# Patient Record
Sex: Male | Born: 1940 | Race: White | Hispanic: No | Marital: Married | Smoking: Former smoker
Health system: Southern US, Community
[De-identification: ages and names within clinical notes are randomized; demographics above are authoritative.]

## PROBLEM LIST (undated history)

## (undated) DIAGNOSIS — K219 Gastro-esophageal reflux disease without esophagitis: Secondary | ICD-10-CM

## (undated) DIAGNOSIS — E538 Deficiency of other specified B group vitamins: Secondary | ICD-10-CM

## (undated) DIAGNOSIS — G4733 Obstructive sleep apnea (adult) (pediatric): Secondary | ICD-10-CM

## (undated) DIAGNOSIS — G629 Polyneuropathy, unspecified: Secondary | ICD-10-CM

## (undated) DIAGNOSIS — M459 Ankylosing spondylitis of unspecified sites in spine: Secondary | ICD-10-CM

## (undated) DIAGNOSIS — I4891 Unspecified atrial fibrillation: Secondary | ICD-10-CM

## (undated) DIAGNOSIS — I499 Cardiac arrhythmia, unspecified: Secondary | ICD-10-CM

## (undated) DIAGNOSIS — I4892 Unspecified atrial flutter: Secondary | ICD-10-CM

## (undated) DIAGNOSIS — I341 Nonrheumatic mitral (valve) prolapse: Secondary | ICD-10-CM

## (undated) HISTORY — PX: INGUINAL HERNIA REPAIR: SHX194

## (undated) HISTORY — PX: OTHER SURGICAL HISTORY: SHX169

## (undated) HISTORY — DX: Cardiac arrhythmia, unspecified: I49.9

## (undated) HISTORY — PX: COLONOSCOPY: SHX174

## (undated) HISTORY — DX: Polyneuropathy, unspecified: G62.9

## (undated) HISTORY — PX: ABLATION: SHX5711

## (undated) HISTORY — DX: Unspecified atrial fibrillation: I48.91

## (undated) HISTORY — DX: Gastro-esophageal reflux disease without esophagitis: K21.9

## (undated) HISTORY — DX: Deficiency of other specified B group vitamins: E53.8

## (undated) HISTORY — DX: Unspecified atrial flutter: I48.92

## (undated) HISTORY — DX: Nonrheumatic mitral (valve) prolapse: I34.1

## (undated) HISTORY — DX: Obstructive sleep apnea (adult) (pediatric): G47.33

## (undated) HISTORY — DX: Ankylosing spondylitis of unspecified sites in spine: M45.9

---

## 1999-09-22 ENCOUNTER — Ambulatory Visit (HOSPITAL_COMMUNITY): Admission: RE | Admit: 1999-09-22 | Discharge: 1999-09-22 | Payer: Self-pay | Admitting: Gastroenterology

## 2009-05-20 ENCOUNTER — Encounter: Admission: RE | Admit: 2009-05-20 | Discharge: 2009-05-20 | Payer: Self-pay | Admitting: Cardiology

## 2009-08-14 ENCOUNTER — Ambulatory Visit (HOSPITAL_COMMUNITY): Admission: RE | Admit: 2009-08-14 | Discharge: 2009-08-14 | Payer: Self-pay | Admitting: Cardiology

## 2009-09-27 ENCOUNTER — Encounter: Payer: Self-pay | Admitting: Internal Medicine

## 2009-10-04 ENCOUNTER — Ambulatory Visit (HOSPITAL_COMMUNITY): Admission: RE | Admit: 2009-10-04 | Discharge: 2009-10-04 | Payer: Self-pay | Admitting: Cardiology

## 2009-10-08 ENCOUNTER — Ambulatory Visit: Payer: Self-pay | Admitting: Internal Medicine

## 2009-10-08 DIAGNOSIS — G4733 Obstructive sleep apnea (adult) (pediatric): Secondary | ICD-10-CM | POA: Insufficient documentation

## 2009-10-08 DIAGNOSIS — I4891 Unspecified atrial fibrillation: Secondary | ICD-10-CM | POA: Insufficient documentation

## 2009-10-24 ENCOUNTER — Ambulatory Visit (HOSPITAL_BASED_OUTPATIENT_CLINIC_OR_DEPARTMENT_OTHER): Admission: RE | Admit: 2009-10-24 | Discharge: 2009-10-24 | Payer: Self-pay | Admitting: Internal Medicine

## 2009-10-24 ENCOUNTER — Encounter: Payer: Self-pay | Admitting: Internal Medicine

## 2009-10-26 ENCOUNTER — Ambulatory Visit: Payer: Self-pay | Admitting: Internal Medicine

## 2009-11-08 ENCOUNTER — Ambulatory Visit: Payer: Self-pay | Admitting: Internal Medicine

## 2009-11-18 ENCOUNTER — Encounter: Payer: Self-pay | Admitting: Internal Medicine

## 2009-11-27 ENCOUNTER — Encounter: Payer: Self-pay | Admitting: Internal Medicine

## 2009-11-28 ENCOUNTER — Encounter: Payer: Self-pay | Admitting: Internal Medicine

## 2009-12-08 ENCOUNTER — Encounter: Payer: Self-pay | Admitting: Internal Medicine

## 2009-12-09 ENCOUNTER — Encounter: Payer: Self-pay | Admitting: Internal Medicine

## 2009-12-12 ENCOUNTER — Ambulatory Visit: Payer: Self-pay | Admitting: Internal Medicine

## 2009-12-15 ENCOUNTER — Telehealth: Payer: Self-pay | Admitting: Internal Medicine

## 2009-12-19 ENCOUNTER — Encounter: Payer: Self-pay | Admitting: Internal Medicine

## 2010-01-05 ENCOUNTER — Encounter: Payer: Self-pay | Admitting: Internal Medicine

## 2010-02-27 ENCOUNTER — Encounter: Payer: Self-pay | Admitting: Internal Medicine

## 2010-02-28 ENCOUNTER — Ambulatory Visit: Payer: Self-pay | Admitting: Cardiology

## 2010-03-03 ENCOUNTER — Ambulatory Visit: Payer: Self-pay | Admitting: Cardiology

## 2010-03-03 ENCOUNTER — Encounter: Payer: Self-pay | Admitting: Internal Medicine

## 2010-03-05 ENCOUNTER — Ambulatory Visit: Payer: Self-pay | Admitting: Cardiology

## 2010-03-05 ENCOUNTER — Ambulatory Visit (HOSPITAL_COMMUNITY): Admission: RE | Admit: 2010-03-05 | Discharge: 2010-03-06 | Payer: Self-pay | Admitting: Cardiology

## 2010-03-07 ENCOUNTER — Ambulatory Visit: Payer: Self-pay | Admitting: Cardiology

## 2010-03-14 ENCOUNTER — Ambulatory Visit: Payer: Self-pay | Admitting: Cardiology

## 2010-03-21 ENCOUNTER — Ambulatory Visit: Payer: Self-pay | Admitting: Cardiology

## 2010-04-04 ENCOUNTER — Ambulatory Visit: Payer: Self-pay | Admitting: Cardiology

## 2010-04-17 ENCOUNTER — Ambulatory Visit: Payer: Self-pay | Admitting: Internal Medicine

## 2010-04-24 ENCOUNTER — Ambulatory Visit: Payer: Self-pay | Admitting: Cardiology

## 2010-05-13 ENCOUNTER — Ambulatory Visit: Payer: Self-pay | Admitting: Cardiology

## 2010-05-26 ENCOUNTER — Ambulatory Visit: Payer: Self-pay | Admitting: Cardiology

## 2010-06-05 ENCOUNTER — Ambulatory Visit: Payer: Self-pay | Admitting: Cardiology

## 2010-06-24 ENCOUNTER — Ambulatory Visit: Payer: Self-pay | Admitting: Cardiology

## 2010-07-29 ENCOUNTER — Ambulatory Visit: Payer: Self-pay | Admitting: Cardiology

## 2010-08-05 ENCOUNTER — Ambulatory Visit: Payer: Self-pay | Admitting: Cardiology

## 2010-08-27 ENCOUNTER — Ambulatory Visit (INDEPENDENT_AMBULATORY_CARE_PROVIDER_SITE_OTHER): Payer: Medicare Other | Admitting: Cardiology

## 2010-08-27 DIAGNOSIS — I4891 Unspecified atrial fibrillation: Secondary | ICD-10-CM

## 2010-08-27 DIAGNOSIS — E7800 Pure hypercholesterolemia, unspecified: Secondary | ICD-10-CM

## 2010-08-28 NOTE — Assessment & Plan Note (Signed)
Summary: rov 1 month///kp   Primary Provider/Referring Provider:  Ronny Flurry  CC:  Follow up visit-sleep.Marland Kitchen  History of Present Illness: History of Present Illness: November 03, 2009-  70 yoM seenat kind request of Dr Patty Sermons for suspected sleep apnea. He has atrial fibrilation, now on Multaq, but may need ablation. The question of sleep apnea has been raised, possibly affecting anesthesia risk. His wife tells him he snores and stops breathing. At home they mask this with a white noise generator. He denies daytime somnolence. Bedtime 10-11PM, latency 5 minutes with no sleep med. Awake 1-2x before finally waking at 530-6AM. Weight has been stable. Little caffeine. Back pain has limited sleep positions some over time.  November 08, 2009- OSA  Returns for f/u of his sleep study. We reviewed the study and discussed medical significance and treatment options. NPSG 10/24/09- AHI 21.8/hr. Comfort was affecfted by inability to lie on his back.   Dec 12, 2009- OSA He changed mask from pillows  to a nasal mask he likes better initially. He averages 5 hours every night and is motivated to keep using CPAP. His wife indicates no snore through this mask.  We don't yet have the download from his AutoPAP.  He likes his new Tempurpedic mattress.    Current Medications (verified): 1)  Coumadin 10 Mg Tabs (Warfarin Sodium) .... Take As Directed 2)  Metoprolol Tartrate 50 Mg Tabs (Metoprolol Tartrate) .... Take 1 By Mouth Once Daily 3)  Cpap .... 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    Vital Signs:  Patient profile:   70 year old male Height:      76 inches Weight:      236 pounds BMI:     28.83 BP sitting:   130 / 74  (left arm) Cuff size:   large  Vitals Entered By: Reynaldo Minium CMA (Dec 12, 2009 3:21 PM)  O2 Flow:  Room air  Physical Exam  Additional Exam:  General: A/Ox3; pleasant and cooperative, NAD, tall, well-appearing SKIN: no rash, lesions NODES: no lymphadenopathy HEENT: [place]/AT, EOM- WNL, Conjuctivae- clear, PERRLA, TM-WNL, Nose- clear, Throat- clear and wnl, Mallampati  II NECK: Supple w/ fair ROM, JVD- none, normal carotid impulses w/o bruits Thyroid-  CHEST: Clear to P&A HEART: IRR, no m/g/r heard ABDOMEN: Soft and nl;  EAV:WUJW, nl pulses, no edema  NEURO: Grossly intact to observation      Impression & Recommendations:  Problem # 1:  OBSTRUCTIVE SLEEP APNEA (ICD-327.23)  Good initial start with CPAP. e will make conversion to fixed pressure when download is available., He has  relaistic expectation about adjustment to the mask. he was not dsignificantly sleepy before so it will be harder for him to recognize benefit.  Medications  Added to Medication List This Visit: 1)  Cpap  .... Ahc set on 5-15h2o  Other Orders: Est. Patient Level II (16109)  Patient Instructions: 1)  Please schedule a follow-up appointment in 4 months. 2)   I will get your pressure changed to a fixed setting as available.

## 2010-08-28 NOTE — Assessment & Plan Note (Signed)
Summary: rov 4 months///kp   Primary Provider/Referring Provider:  Ronny Flurry  CC:  4 month follow up visit-OSA.  History of Present Illness:  November 08, 2009- OSA  Returns for f/u of his sleep study. We reviewed the study and discussed medical significance and treatment options. NPSG 10/24/09- AHI 21.8/hr. Comfort was affecfted by inability to lie on his back.   Dec 12, 2009- OSA He changed mask from pillows  to a nasal mask he likes better initially. He averages 5 hours every night and is motivated to keep using CPAP. His wife indicates no snore through this mask.  We don't yet have the download from his AutoPAP.  He likes his new Tempurpedic mattress.  April 17, 2010- OSA, chronic AFib, ankylosing spondyitis CPAP Pressure of 11 was too high, but now at pressure of 9 he is not snoring and is able to sleep through the night. The tempurpedic mattress is also likely helping because it alows for his spondylitis better. He had ablation for AF in [place] in July, then two cardioversions. Since then he has been in sinus rhythm and is stable.. He had to adjust the CPAP machine at that time and found he was better without the ramp. Using nasal mask.    Preventive Screening-Counseling & Management  Alcohol-Tobacco     Smoking Status: never  Current Medications (verified): 1)  Coumadin 15 Mg Tabs (Warfarin Sodium) .... Take As Directed 2)  Metoprolol Tartrate 50 Mg Tabs (Metoprolol Tartrate) .... Take 1.5  By Mouth Once Daily 3)  Cpap 9 Advanced .... 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    Vital Signs:  Patient profile:   70 year old male Height:      76 inches Weight:      232.25 pounds BMI:     28.37 O2 Sat:      95 % on Room air Pulse rate:   64 / minute BP sitting:   104 / 60  (left arm) Cuff size:   regular  Vitals Entered By: Reynaldo Minium CMA (April 17, 2010 3:25 PM)  O2 Flow:  Room air CC: 4 month follow up visit-OSA   Physical Exam  Additional Exam:  General: A/Ox3; pleasant and cooperative, NAD, tall, well-appearing SKIN: no rash, lesions NODES: no lymphadenopathy HEENT: [place]/AT, EOM- WNL, Conjuctivae- clear, PERRLA, TM-WNL, Nose- clear, Throat- clear and wnl, Mallampati  II NECK: Supple w/ fair ROM, JVD- none, normal carotid impulses w/o bruits Thyroid-  CHEST: Clear to P&A HEART:Regular rhythm, no m/g/r heard ABDOMEN: Soft and nl;  MWU:XLKG, nl pulses, no edema  NEURO: Grossly intact to observation      Impression & Recommendations:  Problem # 1:  OBSTRUCTIVE SLEEP APNEA (ICD-327.23)  Good subjective control. He has had a chip ready to turn in. We anticipate leaving him at 9 cwp now. His compliance is good.  Flu vax  Orders: Est. Patient Level V (40102)  Problem # 2:  ATRIAL FIBRILLATION, CHRONIC (ICD-427.31)  He remains in sinus rhythm now and we talked  about the impact on his heart of untreated OSA. His updated medication list for this problem includes:    Metoprolol Tartrate 50 Mg Tabs (Metoprolol tartrate) .Marland Kitchen... Take 1.5  by mouth once daily  Orders: Est. Patient Level V (72536)  Medications Added to Medication List This Visit: 1)  Coumadin 15 Mg Tabs (warfarin Sodium)  .... Take as directed 2)  Metoprolol Tartrate 50 Mg Tabs (Metoprolol tartrate) .... Take 1.5  by mouth once daily 3)  Cpap 9 Advanced  .... Based on autotitration 12/09/09  Other Orders: Flu Vaccine 21yrs + (64403) Administration Flu vaccine - MCR (K7425)  Patient Instructions: 1)  Please schedule a follow-up appointment in 6 months. 2)  We will watch for the download result when you turn in the latest card. if we make a change from that and you don't like it- please let me know so we can fix it. Flu Vaccine Consent Questions     Do you have a history of severe allergic reactions to this vaccine? no    Any prior history of allergic reactions to egg and/or gelatin? no    Do you have a sensitivity to the preservative Thimersol? no    Do you have a past history of Guillan-Barre Syndrome? no    Do you currently have an acute febrile illness? no    Have you ever had a severe reaction to latex? no    Vaccine information given and explained to patient? yes    Are you currently pregnant? no    Lot Number:[id number]   Exp Date:01/23/2010   Site Given  Left Deltoid Sallye Lat CMA  April 17, 2010 4:22 PM      .lbmedflu

## 2010-08-28 NOTE — Letter (Signed)
Summary: CMN for CPAP Supplies/[facility]  CMN for CPAP Supplies/[facility]   Imported By: Sherian Rein 12/13/2009 14:27:41  _____________________________________________________________________  External Attachment:    Type:   Image     Comment:   External Document

## 2010-08-28 NOTE — Assessment & Plan Note (Signed)
Summary: questionable sleep apnea/ mbw   Primary Provider/Referring Provider:  Ronny Flurry  CC:  Sleep consult-possible sleep apnea;due for oblation soon.Marland Kitchen  History of Present Illness: 2009-10-12-  70 yoM seenat kind request of Dr Patty Sermons for suspected sleep apnea. He has atrial fibrilation, now on Multaq, but may need ablation. The question of sleep apnea has been raised, possibly affecting anesthesia risk. His wife tells him he snores and stops breathing. At home they mask this with a white noise generator. He denies daytime somnolence. Bedtime 10-11PM, latency 5 minutes with no sleep med. Awake 1-2x before finally waking at 530-6AM. Weight has been stable. Little caffeine. Back pain has limited sleep positions some over time.  Current Medications (verified): 1)  Coumadin 10 Mg Tabs (Warfarin Sodium) .... Take As Directed 2)  Metoprolol Tartrate 50 Mg Tabs (Metoprolol Tartrate) .... Take 1 By Mouth Once Daily 3)  Multaq 400 Mg Tabs (Dronedarone Hcl) .... 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  The patient denies shortness of breath at rest, productive cough, coughing up blood, chest pain, indigestion, loss of appetite,  weight change, abdominal pain, difficulty swallowing, sore throat, tooth/dental problems, headaches, nasal congestion/difficulty breathing through nose, sneezing, itching, ear ache, anxiety, depression, hand/feet swelling, joint stiffness or pain, rash, change in color of mucus, and fever.    Vital Signs:  Patient profile:   70 year old male Height:      76 inches Weight:      242 pounds BMI:     29.56 O2 Sat:      95 % on Room air Pulse rate:   91 / minute BP sitting:   112 / 74  (right arm) Cuff size:   regular  Vitals Entered By: Reynaldo Minium CMA (10/12/2009 3:55 PM)  O2 Flow:  Room air  Physical Exam  Additional Exam:  General: A/Ox3; pleasant and cooperative, NAD, tall, well-appearing SKIN: no rash, lesions NODES: no lymphadenopathy HEENT: /AT, EOM- WNL, Conjuctivae- clear, PERRLA, TM-WNL, Nose- clear, Throat- clear and wnl, Mallampati  II NECK: Supple w/ fair ROM, JVD- none, normal carotid impulses w/o bruits Thyroid- normal to palpation CHEST: Clear to P&A HEART: IRR, no m/g/r heard ABDOMEN: Soft and nl; nml bowel sounds; no organomegaly or masses noted NWG:NFAO, nl pulses, no edema  NEURO: Grossly intact to observation      Impression & Recommendations:  Problem # 1:  ? of OBSTRUCTIVE SLEEP APNEA (ICD-327.23)  Witnessed snoring and apnea as described by his wife. Medical issue is atrial fibrillation possibly needing ablation under anesthesia . We discussed and will schedule a sleep study.  Medications Added to Medication List This Visit: 1)  Coumadin 10 Mg Tabs (Warfarin sodium) .... Take as directed 2)  Metoprolol Tartrate 50 Mg Tabs (Metoprolol tartrate) .... Take 1 by mouth once daily 3)  Multaq 400 Mg Tabs (Dronedarone hcl) .... Take 1 by mouth once daily  Other Orders: Consultation Level IV (14782) DME Referral (DME)  Patient Instructions: 1)  Please schedule a follow-up appointment in 1 month. 2)  See [facility] to schedule sleep study. It is ok to  bring an [place] with you if you wish.

## 2010-08-28 NOTE — Letter (Signed)
Summary: [facility]   Imported By: Lester [place] 10/23/2009 09:15:10  _____________________________________________________________________  External Attachment:    Type:   Image     Comment:   External Document

## 2010-08-28 NOTE — Assessment & Plan Note (Signed)
Summary: rov 1 month ///kp   Primary Provider/Referring Provider:  Ronny Flurry  CC:  1 month follow up visit -sleep study results. .  History of Present Illness: 10/20/09-  68 yoM seenat kind request of Dr Patty Sermons for suspected sleep apnea. He has atrial fibrilation, now on Multaq, but may need ablation. The question of sleep apnea has been raised, possibly affecting anesthesia risk. His wife tells him he snores and stops breathing. At home they mask this with a white noise generator. He denies daytime somnolence. Bedtime 10-11PM, latency 5 minutes with no sleep med. Awake 1-2x before finally waking at 530-6AM. Weight has been stable. Little caffeine. Back pain has limited sleep positions some over time.  November 08, 2009- OSA  Returns for f/u of his sleep study. We reviewed the study and discussed medical significance and treatment options. NPSG 10/24/09- AHI 21.8/hr. Comfort was affecfted by inability to lie on his back.     Current Medications (verified): 1)  Coumadin 10 Mg Tabs (Warfarin Sodium) .... Take As Directed 2)  Metoprolol Tartrate 50 Mg Tabs (Metoprolol Tartrate) .... 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    Vital Signs:  Patient profile:   70 year old male Height:      76 inches Weight:      241.38 pounds O2 Sat:      92 % on Room air Pulse rate:   88 / minute BP sitting:   114 / 66  (left arm) Cuff size:   regular  Vitals Entered By: Reynaldo Minium CMA (November 08, 2009 3:39 PM)  O2 Flow:  Room air  Physical Exam  Additional Exam:  General: A/Ox3; pleasant and cooperative, NAD, tall, well-appearing SKIN: no rash, lesions NODES: no lymphadenopathy HEENT: [place]/AT, EOM- WNL, Conjuctivae- clear, PERRLA, TM-WNL, Nose- clear, Throat- clear and wnl, Mallampati  II NECK: Supple w/ fair ROM, JVD- none, normal carotid impulses w/o bruits Thyroid-  CHEST: Clear to P&A HEART: IRR, no m/g/r heard ABDOMEN: Soft and nl;  JYN:WGNF, nl pulses, no edema  NEURO: Grossly intact to observation      Impression & Recommendations:  Problem # 1:  ? of OBSTRUCTIVE SLEEP APNEA (ICD-327.23)  We had a long discussion of CPAP and alternatives, mask options and expectations. We are going to reassess CPAP with an autotitration trial.  Other Orders: Est. Patient Level III (62130) DME Referral (DME)  Patient Instructions: 1)  Please schedule a follow-up appointment in 1 month. 2)  See [facility] to start CPAP

## 2010-08-28 NOTE — Letter (Signed)
Summary: Cassell Clement MD  Cassell Clement MD   Imported By: Lester [place] 11/29/2009 10:06:26  _____________________________________________________________________  External Attachment:    Type:   Image     Comment:   External Document

## 2010-08-28 NOTE — Letter (Signed)
Summary: [facility]   Imported By: Sherian Rein 03/18/2010 11:43:19  _____________________________________________________________________  External Attachment:    Type:   Image     Comment:   External Document

## 2010-08-28 NOTE — Letter (Signed)
Summary: [facility]   Imported By: Sherian Rein 03/18/2010 11:44:10  _____________________________________________________________________  External Attachment:    Type:   Image     Comment:   External Document

## 2010-08-28 NOTE — Progress Notes (Signed)
Summary: Change CPAP to 11 cwp   Phone Note Other Incoming   Summary of Call: Advanced- CPAP autotitration to 10.4/ AHI 4.0. i will have fixed pressure set at 11. Initial call taken by: Waymon Budge MD,  Dec 15, 2009 7:24 PM  Follow-up for Phone Call        [facility] for pt to return my call in regards to cpap autotitration. Rhonda Cobb  Dec 16, 2009 11:31 AM Called pt at work [phone] and advised pt of titration report and that Dr. Maple Hudson wanted to change his cpap to a fixed pressure of 11 cwp. Pt is requesting that someone call him back regarding this. Pt stated that last night 12/15/09 pt woke up and felt like his mouth was full of air. He looked at pressure on cpap thinking that it probably was a high pressure and cpap stated that he was only on a pressure of 9.5 cwp. Pt is concerned that 9.5 caused this reaction and is afraid to go to 11 cwp. Please advise.  Pt may be reached during working hours at [phone]. Alfonso Ramus  Dec 16, 2009 5:51 PM  Follow-up by: Waymon Budge MD,  Dec 17, 2009 11:30 AM  Additional Follow-up for Phone Call Additional follow up Details #1::        Will reduce initial pressure setting for now.  LMOAM for pt that after reviewing phone note, Dr. Maple Hudson has decided to change cpap pressure to 9 cwp. Order has been sent to the homecare company. Advised pt to try this fixed pressure of 9 and call us back and give Dr. Maple Hudson an update on this pressure setting. Pt advised to call me back if he had any questions or concerns at [phone]. Alfonso Ramus  Dec 17, 2009 11:37 AM  Additional Follow-up by: Waymon Budge MD,  Dec 17, 2009 11:30 AM    New/Updated Medications: * CPAP 11 ADVANCED Based on autotitration 12/09/09

## 2010-08-28 NOTE — Letter (Signed)
Summary: [facility]   Imported By: Lester [place] 01/01/2010 10:42:13  _____________________________________________________________________  External Attachment:    Type:   Image     Comment:   External Document

## 2010-09-24 ENCOUNTER — Other Ambulatory Visit: Payer: Medicare Other

## 2010-09-29 ENCOUNTER — Other Ambulatory Visit (INDEPENDENT_AMBULATORY_CARE_PROVIDER_SITE_OTHER): Payer: Medicare Other

## 2010-09-29 DIAGNOSIS — E7800 Pure hypercholesterolemia, unspecified: Secondary | ICD-10-CM

## 2010-10-03 ENCOUNTER — Encounter: Payer: Self-pay | Admitting: Internal Medicine

## 2010-10-14 ENCOUNTER — Encounter: Payer: Self-pay | Admitting: Cardiology

## 2010-10-14 ENCOUNTER — Ambulatory Visit: Payer: Self-pay | Admitting: Internal Medicine

## 2010-12-12 NOTE — Procedures (Signed)
[place]  Patient:    Wesley [name], Wesley [name]                        MRN: [id number] Proc. Date: 09/22/99 Adm. Date:  [id number] Attending:  Rich Brave CC:         [place]is Pu. Patty Sermons, M.D.                           Procedure Report  PROCEDURE PERFORMED:  Colonoscopy.  ENDOSCOPIST:  Florencia Reasons, M.D.  INDICATIONS FOR PROCEDURE:  The patient is a 70 year old with family history of  colon cancer.  FINDINGS:  Normal exam to terminal ileum.  DESCRIPTION OF PROCEDURE:  The nature, purpose and risks of the procedure had been discussed with the patient, who provided written consent.  Sedation was fentanyl 100 mcg and Versed 10 mg IV without arrhythmias or desaturation.  The Olympus adult video colonoscope was advanced through a somewhat spastic sigmoid region to the cecum and for a short distance into normal-appearing terminal ileum. The cecum and terminal ileum were normal.  The quality of the prep was very good and it is felt that all areas were well seen.  This was a normal examination other than some mild sigmoid diverticulosis.  No polyps, cancer, colitis, vascular malformations or extensive diverticulosis were observed.  The patient has a history of ankylosing spondylitis but I did not see any evidence of frank ulcerative colitis.  No mucosal biopsies were obtained. Retroflexion in the rectum was unremarkable.  The patient tolerated the procedure well.  There were no apparent complications.  IMPRESSION:  Normal colonoscopy to the terminal ileum in a patient with a family history of colon cancer and a personal history of ankylosing spondylitis.  No evidence of precancerous lesions or ulcerative colitis noted.  Mild sigmoid diverticulosis present.  PLAN:  Consider follow-up exam in five years because of the family history.  PLAN: Consider follow-up colonoscopy in five years in view of the family history of colon  cancer. DD:  09/22/99 TD:  09/22/99 Job: [id number]

## 2011-01-12 ENCOUNTER — Encounter: Payer: Self-pay | Admitting: Cardiology

## 2011-01-20 ENCOUNTER — Ambulatory Visit (INDEPENDENT_AMBULATORY_CARE_PROVIDER_SITE_OTHER): Payer: Medicare Other | Admitting: Cardiology

## 2011-01-20 ENCOUNTER — Encounter: Payer: Self-pay | Admitting: Cardiology

## 2011-01-20 DIAGNOSIS — E7800 Pure hypercholesterolemia, unspecified: Secondary | ICD-10-CM | POA: Insufficient documentation

## 2011-01-20 DIAGNOSIS — I4891 Unspecified atrial fibrillation: Secondary | ICD-10-CM

## 2011-01-20 DIAGNOSIS — I480 Paroxysmal atrial fibrillation: Secondary | ICD-10-CM | POA: Insufficient documentation

## 2011-01-20 NOTE — Assessment & Plan Note (Signed)
The patient has a history of hypercholesterolemia.  He was unable to bring it under control with diet alone. He had previously had a trial of Lipitor but it had to be stopped because of myalgias.  He is trying to control it with diet and exercise.  He exercises regularly.  His weight has been stable.

## 2011-01-20 NOTE — Assessment & Plan Note (Signed)
The patient has a past history of atrial fibrillation.  He underwent ablation of his atrial fibrillation on 02/19/10.  Initially he held sinus rhythm for only 2 days before going back into atrial fib and had to be cardioverted are still in [place].  When he returned to [facility] he was again in atrial fib and we cardioverted him successfully on August 10 while he was taking Multac.  His Multac was essentially stopped and he has remained in normal sinus rhythm.  He wore a 30 day event monitor which did not show any recurrent atrial fibrillation and as a result of that his Coumadin was stopped and he was placed on aspirin.  He's not been aware of any palpitations.  His energy level is good.

## 2011-01-20 NOTE — Progress Notes (Signed)
Wesley [name] Date of Birth:  06/24/1941 [facility] / [facility] [address]   [address] [place], [place]  [address] [phone]           Fax   [phone]  HPI: This pleasant 70 year old gentleman has a past history of paroxysmal atrial fibrillation.  It subsequently became chronic atrial fib and therefore [facility]  He had cardioversion here in August 2011 which reestablished normal sinus rhythm and he has remained in sinus rhythm since that time.  He is normal and her on Coumadin.  He feels well.  His energy level is good.  He's having no chest pain or shortness of breath.  He has a history of hypercholesterolemia but was unable to tolerate Lipitor because of myalgias.  Presently he is trying to control it with diet and exercise alone.  Current Outpatient Prescriptions  Medication Sig Dispense Refill  . Ascorbic Acid (VITAMIN C) 1000 MG tablet Take 1,000 mg by mouth daily.        Marland Kitchen aspirin 325 MG tablet Take 325 mg by mouth daily.        Marland Kitchen b complex vitamins capsule Take 1 capsule by mouth daily.        Marland Kitchen esomeprazole (NEXIUM) 40 MG capsule Take 40 mg by mouth daily before breakfast.        . fish oil-omega-3 fatty acids 1000 MG capsule Take 1 g by mouth daily.        . metoprolol (TOPROL-XL) 50 MG 24 hr tablet Take 50 mg by mouth daily.       . Multiple Vitamin (MULTIVITAMIN) tablet Take 1 tablet by mouth daily.        Marland Kitchen zolpidem (AMBIEN) 10 MG tablet Take 10 mg by mouth at bedtime as needed.        Marland Kitchen DISCONTD: metoprolol (LOPRESSOR) 50 MG tablet Take 1 and 1/2 tablets by mouth once daily       . DISCONTD: warfarin (COUMADIN) 5 MG tablet Take as directed by coumadin clinic         No Known Allergies  Patient Active Problem List  Diagnoses  . OBSTRUCTIVE SLEEP APNEA  . Paroxysmal atrial fibrillation  . Hypercholesterolemia    History  Smoking status  . Former  Smoker  . Quit date: 07/27/1969  Smokeless tobacco  . Not on file    History  Alcohol Use  . Yes    1-2 most days    Family History  Problem Relation Age of Onset  . Prostate cancer Father   . Arthritis Father   . Cancer Father   . Heart failure Mother   . Hypertension Mother     Review of Systems: The patient denies any heat or cold intolerance.  No weight gain or weight loss.  The patient denies headaches or blurry vision.  There is no cough or sputum production.  The patient denies dizziness.  There is no hematuria or hematochezia.  The patient denies any muscle aches or arthritis.  The patient denies any rash.  The patient denies frequent falling or instability.  There is no history of depression or anxiety.  All other systems were reviewed and are negative.   Physical Exam: Filed Vitals:   01/20/11 0855  BP: 100/70  Pulse: 70  The general appearance feels a well-developed well nourished gentleman in no distress.Pupils equal and reactive.   Extraocular Movements are full.  There is no scleral icterus.  The mouth and pharynx are normal.  The neck is supple.  The carotids reveal no bruits.  The jugular venous pressure is normal.  The thyroid is not enlarged.  There is no lymphadenopathy.  The chest is clear.  Heart reveals no murmur gallop rub or click.  Abdomen is soft and nontender without hepatosplenomegaly or masses.  Extremities show no phlebitis or edema.    Assessment / Plan:  Continue present regimen and recheck in 4 months for followup office visit EKG and fasting lipid panel and chemistries.

## 2011-04-03 ENCOUNTER — Ambulatory Visit (INDEPENDENT_AMBULATORY_CARE_PROVIDER_SITE_OTHER): Payer: Medicare Other | Admitting: Sports Medicine

## 2011-04-03 VITALS — BP 124/70 | Ht 76.0 in | Wt 227.0 lb

## 2011-04-03 DIAGNOSIS — G4733 Obstructive sleep apnea (adult) (pediatric): Secondary | ICD-10-CM

## 2011-04-03 DIAGNOSIS — M67919 Unspecified disorder of synovium and tendon, unspecified shoulder: Secondary | ICD-10-CM

## 2011-04-03 DIAGNOSIS — M25511 Pain in right shoulder: Secondary | ICD-10-CM | POA: Insufficient documentation

## 2011-04-03 DIAGNOSIS — M75101 Unspecified rotator cuff tear or rupture of right shoulder, not specified as traumatic: Secondary | ICD-10-CM | POA: Insufficient documentation

## 2011-04-03 DIAGNOSIS — M19019 Primary osteoarthritis, unspecified shoulder: Secondary | ICD-10-CM | POA: Insufficient documentation

## 2011-04-03 DIAGNOSIS — M25519 Pain in unspecified shoulder: Secondary | ICD-10-CM

## 2011-04-03 NOTE — Progress Notes (Signed)
  Subjective:    Patient ID: Wesley [name], male    DOB: 10/21/1940, 70 y.o.   MRN: [id number]  HPI  RT shoulder pain intermittently x 10 years.  Enjoys playing tennis and golf.  Weakness in rt shoulder for the last 2 years has limited tennis serve, also has not played as much golf during this time.  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  Occasionally takes ibuprofen which is helpful.  Has been doing PT exercises for rotator cuff x 10 years which has helped preserve shoulder motion.  MRI by Dr. Teressa Senter 8 years ago.   Past Hx - ablation for AF in July 11 Has sleep apnea, wears CPAP  Review of Systems     Objective:   Physical Exam  Good ROM of neck Scapular protraction with repeated abduction and elevation and lacks 5 deg on rt Flexion and elevation lacks 5 deg on rt Lacks 4 in IR on rt with back scratch ER significantly limited with mild abduction on rt Weakness on empty can on rt Hawkins caused weakness and pain on rt Weakness with ER on rt, not on lt IR strong bilat Speeds test neg bilat Yergason's neg bilat No bicipital tenderness on rt No atrophy around posterior shoulder, but slight winging of rt shoulder        Assessment & Plan:

## 2011-04-03 NOTE — Assessment & Plan Note (Signed)
Currently continues the use of CPAP  He had symptoms for several years before starting this and this may have contributed to his poor sleep pattern and poor healing

## 2011-04-03 NOTE — Assessment & Plan Note (Signed)
He does well with over-the-counter medications and has learned to control his pain  Continue these but once we evaluate his rotator cuff he may be a candidate for using nitroglycerin

## 2011-04-03 NOTE — Assessment & Plan Note (Signed)
The patient is given a series of pendulum exercises to try to preserve the motion of the shoulder I don't think he should try any strength exercises at this point He can use some theraband at an easy level  We will do an ultrasound in the future and also recheck him after he has had 6 weeks of rehabilitation next

## 2011-04-14 ENCOUNTER — Other Ambulatory Visit: Payer: Medicare Other | Admitting: Sports Medicine

## 2011-04-20 ENCOUNTER — Other Ambulatory Visit: Payer: Self-pay | Admitting: Cardiology

## 2011-04-20 DIAGNOSIS — K219 Gastro-esophageal reflux disease without esophagitis: Secondary | ICD-10-CM

## 2011-04-20 MED ORDER — ESOMEPRAZOLE MAGNESIUM 40 MG PO CPDR: 40.0000 mg | DELAYED_RELEASE_CAPSULE | Freq: Every day | ORAL | Status: DC

## 2011-04-20 NOTE — Telephone Encounter (Signed)
Pt needs 90day supply of  nexium [facility] pls call patient and let him know it's been done please

## 2011-04-20 NOTE — Telephone Encounter (Signed)
Sent in Rx at request of patient

## 2011-04-21 ENCOUNTER — Ambulatory Visit (INDEPENDENT_AMBULATORY_CARE_PROVIDER_SITE_OTHER): Payer: Medicare Other | Admitting: Sports Medicine

## 2011-04-21 VITALS — BP 107/60

## 2011-04-21 DIAGNOSIS — M67919 Unspecified disorder of synovium and tendon, unspecified shoulder: Secondary | ICD-10-CM

## 2011-04-21 DIAGNOSIS — M75101 Unspecified rotator cuff tear or rupture of right shoulder, not specified as traumatic: Secondary | ICD-10-CM

## 2011-04-21 NOTE — Progress Notes (Signed)
  Subjective:    Patient ID: Wesley Peper., male    DOB: September 14, 1940, 70 y.o.   MRN: [id number]  HPI Right shoulder pain particularly with overhead motions for approximately 10 years. Getting better with rehabilitation exercises for the cuff. Localizes pain over deltoid. No radicular symptoms into fingers. Has never had a corticosteroid injection.   Review of Systems    negative with regards to the chief complaint. Objective:   Physical Exam General: Well-developed, well-nourished Caucasian male in no acute distress. MSK: Right Shoulder: Inspection reveals no abnormalities, atrophy or asymmetry. Palpation is normal with no tenderness over AC joint or bicipital groove. ROM is limited in abduction to about 90, full flexion extension internal and external rotation.. Rotator cuff strength weak with supraspinatus, other cuff muscles unremarkable.. No signs of impingement with positive Neer Hawkins and empty can sign. Speeds and Yergason's tests normal. No labral pathology noted with negative Obrien's, negative clunk and good stability. No painful arc and no drop arm sign. No apprehension sign  Consent obtained and verified. Time-out conducted. Noted no overlying erythema, induration, or other signs of local infection. Sterile betadine prep. Furthur cleansed with alcohol. Topical analgesic spray: Ethyl chloride. Joint: Right subacromial Approached in typical fashion with: 25-gauge needle Completed without difficulty Meds: 1 cc Depo-Medrol, 4 cc lidocaine  Complete resolution of symptoms suggest accurate placement of the medication. Advised to call if fevers/chills, erythema, induration, drainage, or persistent bleeding.  MSK US performed of: Right shoulder Shoulder:   Supraspinatus:  Appears normal on long and transverse views, no bursal bulge seen with shoulder abduction on impingement view. Infraspinatus:  Appears normal on long and transverse views. Subscapularis:  Appears  normal on long and transverse views. Teres Minor:  Appears normal on long and transverse views. AC joint:  Capsule distended, mild geyser sign. Glenohumeral Joint:  Appears normal without effusion. Posterior Glenoid Labrum:  Intact without visualized tears. Biceps Tendon:  Appears normal on long and transverse views, no fraying of tendon, tendon located in intertubercular groove, no subluxation with shoulder internal or external rotation. No increased power doppler signal.    Assessment & Plan:

## 2011-04-21 NOTE — Patient Instructions (Signed)
We injected your shoulder. Come back to see [place] in 2 weeks. Ihor Austin. Benjamin Stain, M.D.

## 2011-04-21 NOTE — Assessment & Plan Note (Signed)
No tears seen on ultrasound. Injected as above. He will come back to see [place] in 2-4 weeks.

## 2011-05-12 ENCOUNTER — Encounter: Payer: Self-pay | Admitting: Sports Medicine

## 2011-05-12 ENCOUNTER — Ambulatory Visit (INDEPENDENT_AMBULATORY_CARE_PROVIDER_SITE_OTHER): Payer: Medicare Other | Admitting: Sports Medicine

## 2011-05-12 DIAGNOSIS — M67919 Unspecified disorder of synovium and tendon, unspecified shoulder: Secondary | ICD-10-CM

## 2011-05-12 DIAGNOSIS — M75101 Unspecified rotator cuff tear or rupture of right shoulder, not specified as traumatic: Secondary | ICD-10-CM

## 2011-05-12 NOTE — Assessment & Plan Note (Signed)
See assessment  He has made great progress but since he has had trouble with this off and on for 10 years I would like to see him in 6 weeks to get him started on a chronic exercise program to help him prevent rotator cuff issues

## 2011-05-12 NOTE — Progress Notes (Signed)
  Subjective:    Patient ID: Wesley Peper., male    DOB: 21-Mar-1941, 70 y.o.   MRN: [id number]  HPI  F/u R shoulder pain 2/2 rotator cuff syndrome. S/p subacromial bursa injection 2 weeks ago. Has had pain x approx 10 years intermittently, but has recently worsened. Since injection he feels 70% improvement. Has been doing daily theraband exercises. Taking ibuprofen prn. Has improvement in abduction and flexion and feels that his shoulder is getting stronger. Denies any numbness/tingling/weakness in arm.    Past Hx - ablation for AF in July 11 Has sleep apnea, wears CPAP  Review of Systems      Objective:   Physical Exam  Gen: wdwn Lungs: no labored breathing  R shoulder: nontender to palpation along clavicle, AC joint, and gh joint No asymmetry noted Can flex to 180 with discomfort starting at approx 145 degrees, abduct to 145 degrees, slightly decreased internal rotation compared to L but adequate, full external rotation 5/5 and equal b/l strength in all direction No sign of impingement on hawkins, neers No pain/weakness on empty can Neg speeds, yergasons Neg crossover, scarf sign        Assessment & Plan:  * rotator cuff syndrome: improved -recommended continuing HEP with increased ROM up to 120degrees x 1 week then gradually increasing to FROM. As directed in pt handout. -RTC in 6 weeks if persistent symptoms

## 2011-05-12 NOTE — Patient Instructions (Signed)
Continue home exercise program:  Walking hand up wall in front x 6 for range of motion Shoulder dip stretch  Start using 3-5lbs to get to 120 degrees x 1 week then increase about 10 degrees/week

## 2011-06-03 ENCOUNTER — Ambulatory Visit (INDEPENDENT_AMBULATORY_CARE_PROVIDER_SITE_OTHER): Payer: Medicare Other | Admitting: Cardiology

## 2011-06-03 ENCOUNTER — Other Ambulatory Visit: Payer: Medicare Other | Admitting: *Deleted

## 2011-06-03 ENCOUNTER — Encounter: Payer: Self-pay | Admitting: Cardiology

## 2011-06-03 VITALS — BP 118/78 | HR 66 | Ht 76.0 in | Wt 229.0 lb

## 2011-06-03 DIAGNOSIS — I480 Paroxysmal atrial fibrillation: Secondary | ICD-10-CM

## 2011-06-03 DIAGNOSIS — I4891 Unspecified atrial fibrillation: Secondary | ICD-10-CM

## 2011-06-03 DIAGNOSIS — M25511 Pain in right shoulder: Secondary | ICD-10-CM

## 2011-06-03 DIAGNOSIS — I341 Nonrheumatic mitral (valve) prolapse: Secondary | ICD-10-CM

## 2011-06-03 DIAGNOSIS — E7800 Pure hypercholesterolemia, unspecified: Secondary | ICD-10-CM

## 2011-06-03 DIAGNOSIS — M25519 Pain in unspecified shoulder: Secondary | ICD-10-CM

## 2011-06-03 DIAGNOSIS — I059 Rheumatic mitral valve disease, unspecified: Secondary | ICD-10-CM

## 2011-06-03 NOTE — Patient Instructions (Signed)
Will have you come back in the morning after 8:30 for fasting labs and will call you with results.  Your physician recommends that you schedule a follow-up appointment in: 4 months with fasting labs and Wesley Bailey NP or Dr Patty Sermons

## 2011-06-03 NOTE — Assessment & Plan Note (Signed)
The patient has had no recurrent atrial fibrillation.  Denies any chest pain or shortness of breath or dizziness or syncope

## 2011-06-03 NOTE — Progress Notes (Signed)
Wesley [name]. Date of Birth:  February 18, 1941 [facility] / [facility] [address]   [address] [place], [place]  [address] [phone]           Fax   [phone]  HPI: This pleasant 70 year old gentleman is seen for a scheduled four-month followup office visit.  He has a history of previous atrial fibrillation.  He had successful ablation on 02/19/10 in [place].  Following that he had successful cardioversion here in August 2011 which reestablished normal sinus rhythm and he has remained in normal sinus rhythm since that time.  He is no longer on Coumadin.  He takes an aspirin.  He is back to full activity.  Current Outpatient Prescriptions  Medication Sig Dispense Refill  . Ascorbic Acid (VITAMIN C) 1000 MG tablet Take 1,000 mg by mouth daily.        Marland Kitchen aspirin 325 MG tablet Take 325 mg by mouth daily.        Marland Kitchen b complex vitamins capsule Take 1 capsule by mouth daily.        Marland Kitchen esomeprazole (NEXIUM) 40 MG capsule Take 1 capsule (40 mg total) by mouth daily before breakfast.  90 capsule  3  . fish oil-omega-3 fatty acids 1000 MG capsule Take 1 g by mouth daily.        . metoprolol (TOPROL-XL) 50 MG 24 hr tablet Take 50 mg by mouth daily.       . Multiple Vitamin (MULTIVITAMIN) tablet Take 1 tablet by mouth daily.        Marland Kitchen zolpidem (AMBIEN) 10 MG tablet Take 10 mg by mouth at bedtime as needed.          No Known Allergies  Patient Active Problem List  Diagnoses  . OBSTRUCTIVE SLEEP APNEA  . Paroxysmal atrial fibrillation  . Hypercholesterolemia  . Shoulder pain, right  . Rotator cuff syndrome of right shoulder    History  Smoking status  . Former Smoker  . Quit date: 07/27/1969  Smokeless tobacco  . Never Used    History  Alcohol Use  . Yes    1-2 most days    Family History  Problem Relation Age of Onset  . Prostate cancer Father   . Arthritis Father   . Cancer Father   . Heart failure Mother   . Hypertension Mother     Review  of Systems: The patient denies any heat or cold intolerance.  No weight gain or weight loss.  The patient denies headaches or blurry vision.  There is no cough or sputum production.  The patient denies dizziness.  There is no hematuria or hematochezia.  The patient denies any muscle aches or arthritis.  The patient denies any rash.  The patient denies frequent falling or instability.  There is no history of depression or anxiety.  All other systems were reviewed and are negative.   Physical Exam: Filed Vitals:   06/03/11 0841  BP: 118/78  Pulse: 66   general appearance reveals a well-developed well-nourished gentleman in no distress.Pupils equal and reactive.   Extraocular Movements are full.  There is no scleral icterus.  The mouth and pharynx are normal.  The neck is supple.  The carotids reveal no bruits.  The jugular venous pressure is normal.  The thyroid is not enlarged.  There is no lymphadenopathy.  The chest is clear to percussion and auscultation. There are no rales or rhonchi. Expansion of the chest is symmetrical.  Heart reveals a soft apical click and no murmur.The abdomen is soft and nontender. Bowel sounds are normal. The liver and spleen are not enlarged. There Are no abdominal masses. There are no bruits.  Extremities no phlebitis or edemaStrength is normal and symmetrical in all extremities.  There is no lateralizing weakness.  There are no sensory deficits.      Assessment / Plan: Continue same medication.  Recheck in 4 months for followup office visit lipid panel hepatic function panel basal metabolic panel and EKG

## 2011-06-03 NOTE — Assessment & Plan Note (Signed)
The patient has seen Dr. Darrick Penna concerning his right shoulder predicament.  He states that after doing the exercises that were prescribed, his shoulder is 70% better and he notices an improvement in his tennis.

## 2011-06-03 NOTE — Assessment & Plan Note (Signed)
Patient was to have had lab work today but he did not fast.  He will come back later this week for fasting lipids chemistries.  Is not on any statin drugs.  He is watching his diet carefully.

## 2011-06-03 NOTE — Assessment & Plan Note (Signed)
No chest pain or shortness of breath or palpitations 

## 2011-06-04 ENCOUNTER — Other Ambulatory Visit (INDEPENDENT_AMBULATORY_CARE_PROVIDER_SITE_OTHER): Payer: Medicare Other | Admitting: *Deleted

## 2011-06-04 DIAGNOSIS — E7800 Pure hypercholesterolemia, unspecified: Secondary | ICD-10-CM

## 2011-06-04 LAB — BASIC METABOLIC PANEL
BUN: 16 mg/dL (ref 6–23)
CO2: 30 mEq/L (ref 19–32)
Calcium: 9.1 mg/dL (ref 8.4–10.5)
Chloride: 106 mEq/L (ref 96–112)
Creatinine, Ser: 0.9 mg/dL (ref 0.4–1.5)
Glucose, Bld: 100 mg/dL — ABNORMAL HIGH (ref 70–99)
Potassium: 4.1 mEq/L (ref 3.5–5.1)
Sodium: 141 mEq/L (ref 135–145)

## 2011-06-04 LAB — HEPATIC FUNCTION PANEL
Alkaline Phosphatase: 56 U/L (ref 39–117)
Bilirubin, Direct: 0.2 mg/dL (ref 0.0–0.3)

## 2011-06-04 LAB — LIPID PANEL
HDL: 64.4 mg/dL (ref >39.00)
Total CHOL/HDL Ratio: 3
Triglycerides: 55 mg/dL (ref 0.0–149.0)

## 2011-06-05 ENCOUNTER — Ambulatory Visit (INDEPENDENT_AMBULATORY_CARE_PROVIDER_SITE_OTHER): Payer: Medicare Other

## 2011-06-05 DIAGNOSIS — Z23 Encounter for immunization: Secondary | ICD-10-CM

## 2011-06-09 ENCOUNTER — Telehealth: Payer: Self-pay | Admitting: *Deleted

## 2011-06-09 NOTE — Telephone Encounter (Signed)
Message copied by Burnell Blanks on Tue Jun 09, 2011  2:04 PM ------      Message from: Cassell Clement      Created: Fri Jun 05, 2011  9:18 PM       Please report.  The labs are stable.  Continue same meds.  Continue careful diet.  LDL 113 sl high so work harder on low chol diet.

## 2011-06-09 NOTE — Telephone Encounter (Signed)
Advised and mailed copy  

## 2011-06-16 ENCOUNTER — Ambulatory Visit (INDEPENDENT_AMBULATORY_CARE_PROVIDER_SITE_OTHER): Payer: Medicare Other | Admitting: Sports Medicine

## 2011-06-16 VITALS — BP 120/80

## 2011-06-16 DIAGNOSIS — M67919 Unspecified disorder of synovium and tendon, unspecified shoulder: Secondary | ICD-10-CM

## 2011-06-16 DIAGNOSIS — M75101 Unspecified rotator cuff tear or rupture of right shoulder, not specified as traumatic: Secondary | ICD-10-CM

## 2011-06-16 DIAGNOSIS — M719 Bursopathy, unspecified: Secondary | ICD-10-CM

## 2011-06-16 NOTE — Assessment & Plan Note (Signed)
Since he is 80-85% better by his estimate a think we can gradually advance his rehabilitation program  He can continue his normal activities  If this goes well we will see him as needed

## 2011-06-16 NOTE — Patient Instructions (Signed)
Rotator cuff  Doing well on all tests  Build to 5 lbs Start with 3 sets of 10 at least 3x week After 2 weeks try to increase to 3 sets of 15  If this improves to 90 to 95% of normal - keep up and you can see me if you need me

## 2011-06-16 NOTE — Progress Notes (Signed)
  Subjective:    Patient ID: Wesley [name]., male    DOB: 15-Apr-1941, 70 y.o.   MRN: [id number]  HPI Patient states he is at least 80% better and has been very consistent in doing his shoulder exercises at least 5 times per week He has been able to return to playing tennis Now he is able to serve without pain No night pain Not using any medications   Review of Systems     Objective:   Physical Exam No acute distress  Shoulder: Inspection reveals no abnormalities, atrophy or asymmetry. Palpation is normal with no tenderness over AC joint or bicipital groove. ROM is full in all planes. Rotator cuff strength normal throughout. No signs of impingement with negative Neer and Hawkin's tests, empty can. Speeds and Yergason's tests normal. No labral pathology noted with negative Obrien's, negative clunk and good stability. Normal scapular function observed. No painful arc and no drop arm sign. No apprehension sign  On today's exam he has regained his full motion including his back scratch History of testing is just as strong on the right as the left       Assessment & Plan:

## 2011-08-13 ENCOUNTER — Telehealth: Payer: Self-pay | Admitting: Cardiology

## 2011-08-13 NOTE — Telephone Encounter (Signed)
Left a message to call back.

## 2011-08-13 NOTE — Telephone Encounter (Signed)
New Problem   Patient has questions about surgical procedure.  Please return call on wk#

## 2011-08-14 DIAGNOSIS — L259 Unspecified contact dermatitis, unspecified cause: Secondary | ICD-10-CM | POA: Diagnosis not present

## 2011-08-14 DIAGNOSIS — D485 Neoplasm of uncertain behavior of skin: Secondary | ICD-10-CM | POA: Diagnosis not present

## 2011-08-14 DIAGNOSIS — L821 Other seborrheic keratosis: Secondary | ICD-10-CM | POA: Diagnosis not present

## 2011-08-14 NOTE — Telephone Encounter (Signed)
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  Dr Andi Hence an interventionalist radiologist who performs varicose vein procedures and would like to discuss with  Dr. Patty Sermons.  Will forward to  Dr. Patty Sermons and advised patient not back in office until 08/18/11

## 2011-08-14 NOTE — Telephone Encounter (Signed)
Pt rtn call from yesterday he really wants to talk to Dr. Patty Sermons

## 2011-08-20 NOTE — Telephone Encounter (Signed)
I talked to Wesley [name].  He will contact the interventional radiologist at [facility] to discuss the procedure further.

## 2011-09-22 DIAGNOSIS — L259 Unspecified contact dermatitis, unspecified cause: Secondary | ICD-10-CM | POA: Diagnosis not present

## 2011-10-07 ENCOUNTER — Ambulatory Visit (INDEPENDENT_AMBULATORY_CARE_PROVIDER_SITE_OTHER): Payer: Medicare Other | Admitting: Cardiology

## 2011-10-07 ENCOUNTER — Other Ambulatory Visit (INDEPENDENT_AMBULATORY_CARE_PROVIDER_SITE_OTHER): Payer: Medicare Other

## 2011-10-07 ENCOUNTER — Encounter: Payer: Self-pay | Admitting: Cardiology

## 2011-10-07 ENCOUNTER — Telehealth: Payer: Self-pay | Admitting: Cardiology

## 2011-10-07 VITALS — BP 110/70 | HR 73 | Ht 76.0 in | Wt 225.0 lb

## 2011-10-07 DIAGNOSIS — I480 Paroxysmal atrial fibrillation: Secondary | ICD-10-CM

## 2011-10-07 DIAGNOSIS — E7800 Pure hypercholesterolemia, unspecified: Secondary | ICD-10-CM

## 2011-10-07 DIAGNOSIS — I059 Rheumatic mitral valve disease, unspecified: Secondary | ICD-10-CM | POA: Diagnosis not present

## 2011-10-07 DIAGNOSIS — I4891 Unspecified atrial fibrillation: Secondary | ICD-10-CM

## 2011-10-07 DIAGNOSIS — I341 Nonrheumatic mitral (valve) prolapse: Secondary | ICD-10-CM

## 2011-10-07 LAB — BASIC METABOLIC PANEL
Creatinine, Ser: 0.9 mg/dL (ref 0.4–1.5)
GFR: 85.63 mL/min (ref >60.00)
Sodium: 142 mEq/L (ref 135–145)

## 2011-10-07 LAB — HEPATIC FUNCTION PANEL
ALT: 14 U/L (ref 0–53)
AST: 25 U/L (ref 0–37)
Albumin: 4 g/dL (ref 3.5–5.2)
Bilirubin, Direct: 0.1 mg/dL (ref 0.0–0.3)

## 2011-10-07 LAB — LIPID PANEL
HDL: 71.7 mg/dL (ref >39.00)
Total CHOL/HDL Ratio: 3
Triglycerides: 70 mg/dL (ref 0.0–149.0)

## 2011-10-07 NOTE — Telephone Encounter (Signed)
Please return call to patient [phone]  Patient would like to speak with nurse as he had breakfast before his labs, which should therefore be disregarded.  He can be reached at wk# [phone] for return call.

## 2011-10-07 NOTE — Progress Notes (Signed)
Eduar R Angelena Sole. Date of Birth:  Nov 24, 1940 [facility] [address] [place], [place]  [address] [phone]  Fax   [phone]  HPI: This pleasant 71 year old gentleman is seen back for a four-month followup office visit.  He has a past history of atrial fibrillation.  He had successful ablation of his atrial fibrillation on 02/19/10 in [place].  Following that he had successful cardioversion in [place] in August 2000.  Since then he has remained in normal sinus rhythm.  He is no longer on Coumadin and he does take a daily aspirin.  He is back to full activity.  He exercises regularly.  He has a past history of hypercholesterolemia.  At the present time he is on omega-3 fatty acids and low-cholesterol diet.  Current Outpatient Prescriptions  Medication Sig Dispense Refill  . Ascorbic Acid (VITAMIN C) 1000 MG tablet Take 1,000 mg by mouth daily.        Marland Kitchen aspirin 325 MG tablet Take 325 mg by mouth daily.        Marland Kitchen b complex vitamins capsule Take 1 capsule by mouth daily.        Marland Kitchen esomeprazole (NEXIUM) 40 MG capsule Take 1 capsule (40 mg total) by mouth daily before breakfast.  90 capsule  3  . fish oil-omega-3 fatty acids 1000 MG capsule Take 1 g by mouth daily.        . metoprolol (TOPROL-XL) 50 MG 24 hr tablet Take 50 mg by mouth daily.       . Multiple Vitamin (MULTIVITAMIN) tablet Take 1 tablet by mouth daily.        Marland Kitchen zolpidem (AMBIEN) 10 MG tablet Take 10 mg by mouth at bedtime as needed.          No Known Allergies  Patient Active Problem List  Diagnoses  . OBSTRUCTIVE SLEEP APNEA  . Paroxysmal atrial fibrillation  . Hypercholesterolemia  . Shoulder pain, right  . Rotator cuff syndrome of right shoulder  . Mitral valve prolapse    History  Smoking status  . Former Smoker  . Quit date: 07/27/1969  Smokeless tobacco  . Never Used    History  Alcohol Use  . Yes    1-2 most days    Family History  Problem Relation Age  of Onset  . Prostate cancer Father   . Arthritis Father   . Cancer Father   . Heart failure Mother   . Hypertension Mother     Review of Systems: The patient denies any heat or cold intolerance.  No weight gain or weight loss.  The patient denies headaches or blurry vision.  There is no cough or sputum production.  The patient denies dizziness.  There is no hematuria or hematochezia.  The patient denies any muscle aches or arthritis.  The patient denies any rash.  The patient denies frequent falling or instability.  There is no history of depression or anxiety.  All other systems were reviewed and are negative.   Physical Exam: Filed Vitals:   10/07/11 0848  BP: 110/70  Pulse: 73   the general appearance reveals a well-developed well-nourished gentleman in no distressThe head and neck exam reveals pupils equal and reactive.  Extraocular movements are full.  There is no scleral icterus.  The mouth and pharynx are normal.  The neck is supple.  The carotids reveal no bruits.  The jugular venous pressure is normal.  The  thyroid is not enlarged.  There  is no lymphadenopathy.  The chest is clear to percussion and auscultation.  There are no rales or rhonchi.  Expansion of the chest is symmetrical.  The precordium is quiet.  The first heart sound is normal.  The second heart sound is physiologically split.  There is no murmur gallop or rub.  There is a midsystolic click.  There is no abnormal lift or heave.  The abdomen is soft and nontender.  The bowel sounds are normal.  The liver and spleen are not enlarged.  There are no abdominal masses.  There are no abdominal bruits.  Extremities reveal good pedal pulses.  There is no phlebitis or edema.  There is no cyanosis or clubbing.  Strength is normal and symmetrical in all extremities.  There is no lateralizing weakness.  There are no sensory deficits.  The skin is warm and dry.  There is no rash.   EKG today shows normal sinus rhythm with left anterior  hemiblock and no ischemic changes.   Assessment / Plan:  Continue same medication and be rechecked in 4 months for followup office visit and fasting lipid panel.

## 2011-10-07 NOTE — Patient Instructions (Addendum)
Will obtain labs today and call you with the results (LP/BMET/HFP)  Your physician recommends that you continue on your current medications as directed. Please refer to the Current Medication list given to you today.  Your physician wants you to follow-up in: 4 months You will receive a reminder letter in the mail two months in advance. If you don't receive a letter, please call our office to schedule the follow-up appointment.

## 2011-10-07 NOTE — Assessment & Plan Note (Signed)
Patient has a history of hypercholesterolemia.  He is not on statin drugs at this point.  Today's labs were drawn after the patient had had breakfast and so are not entirely reliable.  We will plan to recheck fasting labs at his next visit in 4 months.  Continue to work on careful diet.  His weight is down 4 pounds since last visit.

## 2011-10-07 NOTE — Telephone Encounter (Signed)
Will forward to  Dr. Brackbill  

## 2011-10-07 NOTE — Telephone Encounter (Signed)
Please see my note regarding the lab results themselves

## 2011-10-07 NOTE — Assessment & Plan Note (Signed)
The patient has not been experiencing any atrial fibrillation.  He remains on Toprol-XL 50 mg daily.  He takes a 325 mg aspirin daily.

## 2011-10-08 ENCOUNTER — Telehealth: Payer: Self-pay | Admitting: *Deleted

## 2011-10-08 NOTE — Telephone Encounter (Signed)
Message copied by Burnell Blanks on Thu Oct 08, 2011  8:15 AM ------      Message from: Cassell Clement      Created: Wed Oct 07, 2011  5:05 PM       Please report.  The cholesterol is higher at 214 and the LDL is high at 128.  These numbers are probably not valid since he had breakfast before the blood was drawn.  We should plan to repeat a fasting lipid panel in about 4 months prior to his next office visit and be sure that he is fasting when the blood is drawn.  His liver function tests and electrolytes are satisfactory and his blood sugar was normal.

## 2011-10-08 NOTE — Telephone Encounter (Signed)
Mailed copy of labs and left message to call if any questions  

## 2011-10-08 NOTE — Telephone Encounter (Signed)
Called patient and left message already processed.  Mailed copy and highlighted  Dr. Yevonne Pax comments, call if any questions

## 2011-10-30 DIAGNOSIS — J069 Acute upper respiratory infection, unspecified: Secondary | ICD-10-CM | POA: Diagnosis not present

## 2011-11-11 ENCOUNTER — Telehealth: Payer: Self-pay | Admitting: Cardiology

## 2011-11-11 NOTE — Telephone Encounter (Signed)
New msg Pt wants to know which pcp Dr. Patty Sermons recommends

## 2011-11-11 NOTE — Telephone Encounter (Signed)
Spoke with patient and advised Dr Felicity Coyer, will call back if unable to get appointment

## 2012-01-21 ENCOUNTER — Other Ambulatory Visit: Payer: Self-pay | Admitting: Cardiology

## 2012-01-21 DIAGNOSIS — K219 Gastro-esophageal reflux disease without esophagitis: Secondary | ICD-10-CM

## 2012-01-21 MED ORDER — ESOMEPRAZOLE MAGNESIUM 40 MG PO CPDR: 40.0000 mg | DELAYED_RELEASE_CAPSULE | Freq: Every day | ORAL | Status: DC

## 2012-01-21 MED ORDER — METOPROLOL SUCCINATE ER 50 MG PO TB24: 50.0000 mg | ORAL_TABLET | Freq: Every day | ORAL | Status: DC

## 2012-02-12 ENCOUNTER — Other Ambulatory Visit (INDEPENDENT_AMBULATORY_CARE_PROVIDER_SITE_OTHER): Payer: Medicare Other

## 2012-02-12 ENCOUNTER — Other Ambulatory Visit: Payer: Self-pay | Admitting: *Deleted

## 2012-02-12 DIAGNOSIS — E7800 Pure hypercholesterolemia, unspecified: Secondary | ICD-10-CM | POA: Diagnosis not present

## 2012-02-12 LAB — LIPID PANEL
Cholesterol: 216 mg/dL — ABNORMAL HIGH (ref 0–200)
Triglycerides: 53 mg/dL (ref 0.0–149.0)
VLDL: 10.6 mg/dL (ref 0.0–40.0)

## 2012-02-12 LAB — HEPATIC FUNCTION PANEL: Total Bilirubin: 0.9 mg/dL (ref 0.3–1.2)

## 2012-02-12 LAB — BASIC METABOLIC PANEL
BUN: 12 mg/dL (ref 6–23)
CO2: 28 mEq/L (ref 19–32)
Chloride: 106 mEq/L (ref 96–112)
GFR: 96.2 mL/min (ref >60.00)
Glucose, Bld: 109 mg/dL — ABNORMAL HIGH (ref 70–99)

## 2012-02-12 LAB — LDL CHOLESTEROL, DIRECT: Direct LDL: 127.3 mg/dL

## 2012-02-15 NOTE — Progress Notes (Signed)
Quick Note:  Please make copy of labs for patient visit. ______ 

## 2012-02-19 ENCOUNTER — Encounter: Payer: Self-pay | Admitting: Cardiology

## 2012-02-19 ENCOUNTER — Ambulatory Visit (INDEPENDENT_AMBULATORY_CARE_PROVIDER_SITE_OTHER): Payer: Medicare Other | Admitting: Cardiology

## 2012-02-19 VITALS — BP 110/78 | HR 60 | Ht 76.0 in | Wt 230.0 lb

## 2012-02-19 DIAGNOSIS — E7800 Pure hypercholesterolemia, unspecified: Secondary | ICD-10-CM

## 2012-02-19 DIAGNOSIS — I4891 Unspecified atrial fibrillation: Secondary | ICD-10-CM | POA: Diagnosis not present

## 2012-02-19 DIAGNOSIS — G4733 Obstructive sleep apnea (adult) (pediatric): Secondary | ICD-10-CM

## 2012-02-19 DIAGNOSIS — I480 Paroxysmal atrial fibrillation: Secondary | ICD-10-CM

## 2012-02-19 NOTE — Patient Instructions (Addendum)
Your physician recommends that you continue on your current medications as directed. Please refer to the Current Medication list given to you today.  Your physician recommends that you schedule a follow-up appointment in: 4 months with fasting labs (lp/bmet/hfp)  

## 2012-02-19 NOTE — Assessment & Plan Note (Signed)
Cholesterol continues to run slightly high.  Total cholesterol is 216.  He would prefer not to take statin therapy.  Work harder on diet.  His weight is up 5 pounds since last visit.

## 2012-02-19 NOTE — Assessment & Plan Note (Signed)
No recurrence of atrial fibrillation.  Rhythm remains regular

## 2012-02-19 NOTE — Progress Notes (Signed)
Wesley [name]. Date of Birth:  Aug 21, 1940 [facility] [address] [place], [place]  [address] [phone]  Fax   [phone]  HPI: This pleasant 71 year old gentleman is seen for a scheduled four-month followup office visit.  He has a past history of atrial fibrillation but had successful ablation of his atrial fibrillation on 02/19/10 in [place].  Following his ablation he underwent successful cardioversion back in [place] in August 2011 and since then has remained in normal sinus rhythm.  He is no longer on Coumadin.  He does take a 325 mg aspirin daily.  Patient has a past history of hypercholesterolemia. Current Outpatient Prescriptions  Medication Sig Dispense Refill  . Ascorbic Acid (VITAMIN C) 1000 MG tablet Take 1,000 mg by mouth daily.        Marland Kitchen aspirin 325 MG tablet Take 325 mg by mouth daily.        Marland Kitchen b complex vitamins capsule Take 1 capsule by mouth daily.        Marland Kitchen esomeprazole (NEXIUM) 40 MG capsule Take 1 capsule (40 mg total) by mouth daily before breakfast.  90 capsule  3  . fish oil-omega-3 fatty acids 1000 MG capsule Take 1 g by mouth daily.        . metoprolol succinate (TOPROL-XL) 50 MG 24 hr tablet Take 1 tablet (50 mg total) by mouth daily.  90 tablet  3  . Multiple Vitamin (MULTIVITAMIN) tablet Take 1 tablet by mouth daily.        Marland Kitchen zolpidem (AMBIEN) 10 MG tablet Take 10 mg by mouth at bedtime as needed.          No Known Allergies  Patient Active Problem List  Diagnosis  . OBSTRUCTIVE SLEEP APNEA  . Paroxysmal atrial fibrillation  . Hypercholesterolemia  . Shoulder pain, right  . Rotator cuff syndrome of right shoulder  . Mitral valve prolapse    History  Smoking status  . Former Smoker  . Quit date: 07/27/1969  Smokeless tobacco  . Never Used    History  Alcohol Use  . Yes    1-2 most days    Family History  Problem Relation Age of Onset  . Prostate cancer Father   . Arthritis Father   .  Cancer Father   . Heart failure Mother   . Hypertension Mother     Review of Systems: The patient denies any heat or cold intolerance.  No weight gain or weight loss.  The patient denies headaches or blurry vision.  There is no cough or sputum production.  The patient denies dizziness.  There is no hematuria or hematochezia.  The patient denies any muscle aches or arthritis.  The patient denies any rash.  The patient denies frequent falling or instability.  There is no history of depression or anxiety.  All other systems were reviewed and are negative.   Physical Exam: Filed Vitals:   02/19/12 1049  BP: 110/78  Pulse: 60   the general appearance reveals a well-developed well-nourished gentleman in no distress.The head and neck exam reveals pupils equal and reactive.  Extraocular movements are full.  There is no scleral icterus.  The mouth and pharynx are normal.  The neck is supple.  The carotids reveal no bruits.  The jugular venous pressure is normal.  The  thyroid is not enlarged.  There is no lymphadenopathy.  The chest is clear to percussion and auscultation.  There are no rales or rhonchi.  Expansion of the chest is symmetrical.  The precordium is quiet.  The first heart sound is normal.  The second heart sound is physiologically split.  There is no murmur gallop rub or click.  There is no abnormal lift or heave.  The abdomen is soft and nontender.  The bowel sounds are normal.  The liver and spleen are not enlarged.  There are no abdominal masses.  There are no abdominal bruits.  Extremities reveal good pedal pulses.  There is no phlebitis or edema.  There is no cyanosis or clubbing.  Strength is normal and symmetrical in all extremities.  There is no lateralizing weakness.  There are no sensory deficits.  The skin is warm and dry.  There is no rash.      Assessment / Plan: Continue same medication.  Recheck in 4 months for office visit EKG and fasting lipid panel hepatic function panel  and basal metabolic panel.  Work harder on diet and weight loss.

## 2012-02-19 NOTE — Assessment & Plan Note (Signed)
The patient has obstructive sleep apnea which may have been a causative role in his previous atrial fibrillation.  He is careful to use his CPAP machine each night.

## 2012-02-24 DIAGNOSIS — H40019 Open angle with borderline findings, low risk, unspecified eye: Secondary | ICD-10-CM | POA: Diagnosis not present

## 2012-02-24 DIAGNOSIS — H2510 Age-related nuclear cataract, unspecified eye: Secondary | ICD-10-CM | POA: Diagnosis not present

## 2012-04-19 DIAGNOSIS — Z23 Encounter for immunization: Secondary | ICD-10-CM | POA: Diagnosis not present

## 2012-04-20 ENCOUNTER — Ambulatory Visit: Payer: Medicare Other | Admitting: Internal Medicine

## 2012-05-10 DIAGNOSIS — I4891 Unspecified atrial fibrillation: Secondary | ICD-10-CM | POA: Diagnosis not present

## 2012-05-10 DIAGNOSIS — E7800 Pure hypercholesterolemia, unspecified: Secondary | ICD-10-CM | POA: Diagnosis not present

## 2012-05-10 DIAGNOSIS — Z1331 Encounter for screening for depression: Secondary | ICD-10-CM | POA: Diagnosis not present

## 2012-05-10 DIAGNOSIS — R141 Gas pain: Secondary | ICD-10-CM | POA: Diagnosis not present

## 2012-05-10 DIAGNOSIS — R143 Flatulence: Secondary | ICD-10-CM | POA: Diagnosis not present

## 2012-05-11 DIAGNOSIS — R141 Gas pain: Secondary | ICD-10-CM | POA: Diagnosis not present

## 2012-05-11 DIAGNOSIS — R143 Flatulence: Secondary | ICD-10-CM | POA: Diagnosis not present

## 2012-05-11 DIAGNOSIS — R142 Eructation: Secondary | ICD-10-CM | POA: Diagnosis not present

## 2012-05-23 DIAGNOSIS — R198 Other specified symptoms and signs involving the digestive system and abdomen: Secondary | ICD-10-CM | POA: Diagnosis not present

## 2012-05-23 DIAGNOSIS — K591 Functional diarrhea: Secondary | ICD-10-CM | POA: Diagnosis not present

## 2012-06-01 DIAGNOSIS — Z8042 Family history of malignant neoplasm of prostate: Secondary | ICD-10-CM | POA: Diagnosis not present

## 2012-06-07 ENCOUNTER — Encounter: Payer: Self-pay | Admitting: Sports Medicine

## 2012-06-07 ENCOUNTER — Ambulatory Visit (INDEPENDENT_AMBULATORY_CARE_PROVIDER_SITE_OTHER): Payer: Medicare Other | Admitting: Sports Medicine

## 2012-06-07 VITALS — BP 113/71 | HR 65 | Ht 76.0 in | Wt 230.0 lb

## 2012-06-07 DIAGNOSIS — M722 Plantar fascial fibromatosis: Secondary | ICD-10-CM | POA: Insufficient documentation

## 2012-06-07 DIAGNOSIS — M79609 Pain in unspecified limb: Secondary | ICD-10-CM | POA: Diagnosis not present

## 2012-06-07 DIAGNOSIS — M79673 Pain in unspecified foot: Secondary | ICD-10-CM

## 2012-06-07 NOTE — Assessment & Plan Note (Signed)
Soft heel cups seemed to help his pain a lot  Makes this seem more like os calcis

## 2012-06-07 NOTE — Assessment & Plan Note (Signed)
Use arch strap  Std PF stretches and exercises  rescan if still w sxs p 6 wks

## 2012-06-07 NOTE — Progress Notes (Signed)
Patient ID: Wesley [name]., male   DOB: August 29, 1940, 71 y.o.   MRN: [id number]  Now with RT heel pain - bad past 3 weeks Pain is there all day and not worse in morning Rt at point of heel and not at insertion of PF Took trips in [place] and sept and did a lot of walking May have bruised his heel on rock - unsure if that was trigger  Has had this a few times over past 10 years and usually went away quickly  PE NAD  Bilat cavus feet TTP over os calcis on RT No TTP at insertion of PF No nodules noted Foot structure is normal  [place] There is a thickened PF at 0.79 at the heel There is a calcific fragment possibly from os calcis in soft tissue of heel This is surrounded by hypoechoic change Mild spur at med heel

## 2012-06-08 DIAGNOSIS — D485 Neoplasm of uncertain behavior of skin: Secondary | ICD-10-CM | POA: Diagnosis not present

## 2012-06-08 DIAGNOSIS — D235 Other benign neoplasm of skin of trunk: Secondary | ICD-10-CM | POA: Diagnosis not present

## 2012-06-08 DIAGNOSIS — L570 Actinic keratosis: Secondary | ICD-10-CM | POA: Diagnosis not present

## 2012-06-08 DIAGNOSIS — D1801 Hemangioma of skin and subcutaneous tissue: Secondary | ICD-10-CM | POA: Diagnosis not present

## 2012-06-10 DIAGNOSIS — N401 Enlarged prostate with lower urinary tract symptoms: Secondary | ICD-10-CM | POA: Diagnosis not present

## 2012-06-13 ENCOUNTER — Other Ambulatory Visit: Payer: Medicare Other

## 2012-06-15 ENCOUNTER — Other Ambulatory Visit (INDEPENDENT_AMBULATORY_CARE_PROVIDER_SITE_OTHER): Payer: Medicare Other

## 2012-06-15 DIAGNOSIS — E7800 Pure hypercholesterolemia, unspecified: Secondary | ICD-10-CM | POA: Diagnosis not present

## 2012-06-15 LAB — HEPATIC FUNCTION PANEL
AST: 27 U/L (ref 0–37)
Albumin: 3.8 g/dL (ref 3.5–5.2)
Alkaline Phosphatase: 54 U/L (ref 39–117)
Bilirubin, Direct: 0.2 mg/dL (ref 0.0–0.3)
Total Bilirubin: 1.3 mg/dL — ABNORMAL HIGH (ref 0.3–1.2)
Total Protein: 6.6 g/dL (ref 6.0–8.3)

## 2012-06-15 LAB — BASIC METABOLIC PANEL
CO2: 31 mEq/L (ref 19–32)
Calcium: 9.2 mg/dL (ref 8.4–10.5)
Creatinine, Ser: 0.9 mg/dL (ref 0.4–1.5)
GFR: 92.29 mL/min (ref >60.00)
Glucose, Bld: 92 mg/dL (ref 70–99)

## 2012-06-15 LAB — LIPID PANEL: VLDL: 12 mg/dL (ref 0.0–40.0)

## 2012-06-15 NOTE — Progress Notes (Signed)
Quick Note:  Please make copy of labs for patient visit. ______ 

## 2012-06-17 ENCOUNTER — Encounter: Payer: Self-pay | Admitting: Cardiology

## 2012-06-17 ENCOUNTER — Ambulatory Visit (INDEPENDENT_AMBULATORY_CARE_PROVIDER_SITE_OTHER): Payer: Medicare Other | Admitting: Cardiology

## 2012-06-17 VITALS — BP 123/66 | HR 55 | Resp 18 | Ht 76.0 in | Wt 230.1 lb

## 2012-06-17 DIAGNOSIS — I4891 Unspecified atrial fibrillation: Secondary | ICD-10-CM | POA: Diagnosis not present

## 2012-06-17 DIAGNOSIS — I480 Paroxysmal atrial fibrillation: Secondary | ICD-10-CM

## 2012-06-17 DIAGNOSIS — E7800 Pure hypercholesterolemia, unspecified: Secondary | ICD-10-CM | POA: Diagnosis not present

## 2012-06-17 NOTE — Patient Instructions (Addendum)
Your physician recommends that you continue on your current medications as directed. Please refer to the Current Medication list given to you today.  Your physician recommends that you schedule a follow-up appointment in: 4 months with fasting labs (lp/bmet/hfp)  

## 2012-06-17 NOTE — Assessment & Plan Note (Signed)
The patient has a history of hypercholesterolemia.  He is controlling this with careful diet.  He is not on any statin therapy.

## 2012-06-17 NOTE — Assessment & Plan Note (Signed)
The patient has not been aware of any recurrent atrial fibrillation his last visit.  Is not having any chest pain or shortness of breath.  His energy level is good and he uses the elliptical machine for exercise.  He is not playing tennis at the present time because of some problems with his heel and he has seen Dr. Roanna Epley.

## 2012-06-17 NOTE — Progress Notes (Signed)
Wesley [name]. Date of Birth:  Aug 19, 1940 [facility] [address] [place], [place]  [address] [phone]  Fax   [phone]  HPI: This pleasant 71 year old gentleman is seen for a scheduled four-month followup office visit. He has a past history of atrial fibrillation but had successful ablation of his atrial fibrillation on 02/19/10 in [place]. Following his ablation he underwent successful cardioversion back in [place] in August 2011 and since then has remained in normal sinus rhythm. He is no longer on Coumadin. He does take a 325 mg aspirin daily. Patient has a past history of hypercholesterolemia.  Since her last saw the patient he and his wife went on a vacation trip to [place].  After he got back home he developed diarrhea which has persisted.  He has had it for 10 weeks now.  He recently saw Dr. Matthias Hughs who has placed him on probiotics.  So far there has not been significant improvement and he is still having multiple loose stools daily.   Current Outpatient Prescriptions  Medication Sig Dispense Refill  . Ascorbic Acid (VITAMIN C) 1000 MG tablet Take 1,000 mg by mouth daily.        Marland Kitchen aspirin 325 MG tablet Take 325 mg by mouth daily.        Marland Kitchen b complex vitamins capsule Take 1 capsule by mouth daily.        Marland Kitchen esomeprazole (NEXIUM) 40 MG capsule Take 1 capsule (40 mg total) by mouth daily before breakfast.  90 capsule  3  . fish oil-omega-3 fatty acids 1000 MG capsule Take 1 g by mouth daily.        . metoprolol succinate (TOPROL-XL) 50 MG 24 hr tablet Take 1 tablet (50 mg total) by mouth daily.  90 tablet  3  . Multiple Vitamin (MULTIVITAMIN) tablet Take 1 tablet by mouth daily.        . Probiotic Product (PROBIOTIC DAILY PO) Take by mouth as directed.      . zolpidem (AMBIEN) 10 MG tablet Take 10 mg by mouth at bedtime as needed.          No Known Allergies  Patient Active Problem List  Diagnosis  . OBSTRUCTIVE SLEEP APNEA  .  Paroxysmal atrial fibrillation  . Hypercholesterolemia  . Shoulder pain, right  . Rotator cuff syndrome of right shoulder  . Mitral valve prolapse  . Heel pain  . Plantar fasciitis    History  Smoking status  . Former Smoker  . Quit date: 07/27/1969  Smokeless tobacco  . Never Used    History  Alcohol Use  . Yes    Comment: 1-2 most days    Family History  Problem Relation Age of Onset  . Prostate cancer Father   . Arthritis Father   . Cancer Father   . Heart failure Mother   . Hypertension Mother     Review of Systems: The patient denies any heat or cold intolerance.  No weight gain or weight loss.  The patient denies headaches or blurry vision.  There is no cough or sputum production.  The patient denies dizziness.  There is no hematuria or hematochezia.  The patient denies any muscle aches or arthritis.  The patient denies any rash.  The patient denies frequent falling or instability.  There is no history of depression or anxiety.  All other systems were reviewed and are negative.   Physical Exam: Filed Vitals:   06/17/12 0916  BP:  123/66  Pulse: 55  Resp: 18   the general appearance reveals a well-developed well-nourished gentleman in no distress.The head and neck exam reveals pupils equal and reactive.  Extraocular movements are full.  There is no scleral icterus.  The mouth and pharynx are normal.  The neck is supple.  The carotids reveal no bruits.  The jugular venous pressure is normal.  The  thyroid is not enlarged.  There is no lymphadenopathy.  The chest is clear to percussion and auscultation.  There are no rales or rhonchi.  Expansion of the chest is symmetrical.  The precordium is quiet.  The first heart sound is normal.  The second heart sound is physiologically split.  There is no murmur gallop rub or click.  There is no abnormal lift or heave.  The abdomen is soft and nontender.  The bowel sounds are normal.  The liver and spleen are not enlarged.  There are  no abdominal masses.  There are no abdominal bruits.  Extremities reveal good pedal pulses.  There is no phlebitis or edema.  There is no cyanosis or clubbing.  Strength is normal and symmetrical in all extremities.  There is no lateralizing weakness.  There are no sensory deficits.  The skin is warm and dry.  There is no rash.     Assessment / Plan: Continue same medication.  Recheck in 4 months for followup office visit and fasting lab work.  Continue close followup with Dr. Matthias Hughs regarding his GI issues.

## 2012-06-29 DIAGNOSIS — M5137 Other intervertebral disc degeneration, lumbosacral region: Secondary | ICD-10-CM | POA: Diagnosis not present

## 2012-06-29 DIAGNOSIS — Z981 Arthrodesis status: Secondary | ICD-10-CM | POA: Diagnosis not present

## 2012-06-29 DIAGNOSIS — M459 Ankylosing spondylitis of unspecified sites in spine: Secondary | ICD-10-CM | POA: Diagnosis not present

## 2012-06-29 DIAGNOSIS — IMO0002 Reserved for concepts with insufficient information to code with codable children: Secondary | ICD-10-CM | POA: Diagnosis not present

## 2012-07-05 DIAGNOSIS — R198 Other specified symptoms and signs involving the digestive system and abdomen: Secondary | ICD-10-CM | POA: Diagnosis not present

## 2012-07-05 DIAGNOSIS — K591 Functional diarrhea: Secondary | ICD-10-CM | POA: Diagnosis not present

## 2012-07-06 DIAGNOSIS — M5137 Other intervertebral disc degeneration, lumbosacral region: Secondary | ICD-10-CM | POA: Diagnosis not present

## 2012-07-06 DIAGNOSIS — M5126 Other intervertebral disc displacement, lumbar region: Secondary | ICD-10-CM | POA: Diagnosis not present

## 2012-07-08 DIAGNOSIS — R198 Other specified symptoms and signs involving the digestive system and abdomen: Secondary | ICD-10-CM | POA: Diagnosis not present

## 2012-07-08 DIAGNOSIS — K591 Functional diarrhea: Secondary | ICD-10-CM | POA: Diagnosis not present

## 2012-08-10 DIAGNOSIS — H1130 Conjunctival hemorrhage, unspecified eye: Secondary | ICD-10-CM | POA: Diagnosis not present

## 2012-08-15 DIAGNOSIS — M47817 Spondylosis without myelopathy or radiculopathy, lumbosacral region: Secondary | ICD-10-CM | POA: Diagnosis not present

## 2012-08-23 DIAGNOSIS — G619 Inflammatory polyneuropathy, unspecified: Secondary | ICD-10-CM | POA: Diagnosis not present

## 2012-08-24 ENCOUNTER — Ambulatory Visit (INDEPENDENT_AMBULATORY_CARE_PROVIDER_SITE_OTHER): Payer: Medicare Other | Admitting: Sports Medicine

## 2012-08-24 ENCOUNTER — Encounter: Payer: Self-pay | Admitting: Sports Medicine

## 2012-08-24 VITALS — BP 115/75 | HR 61 | Ht 76.0 in | Wt 230.0 lb

## 2012-08-24 DIAGNOSIS — M79609 Pain in unspecified limb: Secondary | ICD-10-CM | POA: Diagnosis not present

## 2012-08-24 DIAGNOSIS — M79673 Pain in unspecified foot: Secondary | ICD-10-CM

## 2012-08-24 NOTE — Assessment & Plan Note (Addendum)
I feel patient's heel pain is likely secondary to os calculi but the ultrasound did not show any significant changes. Patient actually showed improvement of his plantar fascia. I think patient may be wearing the heel cup and is causing some rubbing on the lateral aspect of the heel. Patient was fitted with foam pads that did have the middle cut out to avoid any more pressure on the heel. Patient did have this fitted an issue and had significant improvement immediately. Patient will try these alterations, he will call us when he gets the EMG report and we'll discuss potentially starting a medicine such as Neurontin, we will have patient come back again in 4-6 weeks for further evaluation.

## 2012-08-24 NOTE — Progress Notes (Signed)
Patient is following up for his right heel pain. Patient states that he was making some mild improvement but recently he's noticed a little bit more of pain on the lateral aspect. Patient states that the plantar fasciitis aspect has seemed to improve. Patient states over the heel pain is worse with activity and does not seem to improve after minutes of walking. Patient states that it is causing him some pain at night as well. Patient also has had some numbness in his lower extremity. Patient has been seen by an orthopedic surgeon who is an EMG yesterday and results are pending. In addition to this patient did have an MRI of his lumbar spine which he does not know the true diagnosis at this time.  Physical exam Blood pressure 115/75, pulse 61, height 6\' 4"  (1.93 m), weight 230 lb (104.327 kg). 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 Patient is minimally tender to palpation over the plantar aspect of the calcaneus more on the lateral aspect. There is some mild callus formation forming. Patient is nontender at the insertion of the plantar fascia. There is no nodules noted. He is neurovascularly intact distally. Patient has full range of motion of the ankles bilaterally.  Muscle skeletal ultrasound was performed and interpreted by me today. Patient's plantar fascia appears to be in much more low normal side. This measured 0.61 cm at the heel. Patient calcific fragments seems to be reabsorbed recently. There is no significant calcific fragments left. Patient does have some mild soft tissue hypoechoic changes in the surrounding area. There is no neovascularization. Patient has a very very mildly spur of the medial heel.

## 2012-08-24 NOTE — Patient Instructions (Addendum)
Good to see you Try the inserts and see if they help.  If the EMG shows abnormality then call us and we will consider neurontin nightly to help with the numbness.  Come back in 4-6 weeks to see if we are improving.

## 2012-09-02 DIAGNOSIS — G619 Inflammatory polyneuropathy, unspecified: Secondary | ICD-10-CM | POA: Diagnosis not present

## 2012-09-02 DIAGNOSIS — G622 Polyneuropathy due to other toxic agents: Secondary | ICD-10-CM | POA: Diagnosis not present

## 2012-09-06 DIAGNOSIS — Z79899 Other long term (current) drug therapy: Secondary | ICD-10-CM | POA: Diagnosis not present

## 2012-09-06 DIAGNOSIS — K219 Gastro-esophageal reflux disease without esophagitis: Secondary | ICD-10-CM | POA: Diagnosis not present

## 2012-09-06 DIAGNOSIS — K591 Functional diarrhea: Secondary | ICD-10-CM | POA: Diagnosis not present

## 2012-09-15 DIAGNOSIS — G609 Hereditary and idiopathic neuropathy, unspecified: Secondary | ICD-10-CM | POA: Diagnosis not present

## 2012-09-15 DIAGNOSIS — I4891 Unspecified atrial fibrillation: Secondary | ICD-10-CM | POA: Diagnosis not present

## 2012-09-15 DIAGNOSIS — R197 Diarrhea, unspecified: Secondary | ICD-10-CM | POA: Diagnosis not present

## 2012-09-15 DIAGNOSIS — IMO0002 Reserved for concepts with insufficient information to code with codable children: Secondary | ICD-10-CM | POA: Diagnosis not present

## 2012-09-21 ENCOUNTER — Ambulatory Visit (INDEPENDENT_AMBULATORY_CARE_PROVIDER_SITE_OTHER): Payer: Medicare Other | Admitting: Sports Medicine

## 2012-09-21 VITALS — BP 100/60 | Ht 76.0 in | Wt 230.0 lb

## 2012-09-21 DIAGNOSIS — R6889 Other general symptoms and signs: Secondary | ICD-10-CM | POA: Insufficient documentation

## 2012-09-21 DIAGNOSIS — M79609 Pain in unspecified limb: Secondary | ICD-10-CM | POA: Diagnosis not present

## 2012-09-21 DIAGNOSIS — R209 Unspecified disturbances of skin sensation: Secondary | ICD-10-CM | POA: Insufficient documentation

## 2012-09-21 DIAGNOSIS — M25579 Pain in unspecified ankle and joints of unspecified foot: Secondary | ICD-10-CM | POA: Diagnosis not present

## 2012-09-21 DIAGNOSIS — M722 Plantar fascial fibromatosis: Secondary | ICD-10-CM

## 2012-09-21 DIAGNOSIS — G63 Polyneuropathy in diseases classified elsewhere: Secondary | ICD-10-CM | POA: Insufficient documentation

## 2012-09-21 DIAGNOSIS — M79671 Pain in right foot: Secondary | ICD-10-CM

## 2012-09-21 NOTE — Progress Notes (Signed)
Patient ID: Wesley [name]., male   DOB: 1940/10/08, 72 y.o.   MRN: [id number]  Follow up of PF and an os calcis bone spur Cutout pad does help on left heel PF exercises and stretches help  Uses bike without any pain. He can her normal activities but if he does this for exercise he does get some foot pain. Coughing is not particularly painful.  Currently he is being evaluated for a peripheral neuropathy that affects both his lower and upper extremities. However his pain on his feet does not appear to be neuropathic.  On the left foot there is some pain at the head of the second metatarsal and he notes that his bunion change on left but hasn't decreased somewhat     Examination  slt hallux valgus on left and RT more on left On left there is loss of transverse arch  stil mildly ttp at LT pf Not TTP at os calcis  Good great toe motion  Tenderness to palpation at the plantar surface of the second MTP joint left foot

## 2012-09-21 NOTE — Assessment & Plan Note (Signed)
Metatarsal pads were placed for the left second metatarsal  This improved his pain so he we'll use in shoes

## 2012-09-21 NOTE — Assessment & Plan Note (Signed)
3 additional cutout pads given for his shoes  Recheck when necessary

## 2012-09-21 NOTE — Assessment & Plan Note (Signed)
This continues to improve but there is some chronic pain that has persisted for years  He will continue the exercises and stretches at least 3 times weekly  He will use comfortable shoes

## 2012-10-19 ENCOUNTER — Other Ambulatory Visit (INDEPENDENT_AMBULATORY_CARE_PROVIDER_SITE_OTHER): Payer: Medicare Other

## 2012-10-19 DIAGNOSIS — E7800 Pure hypercholesterolemia, unspecified: Secondary | ICD-10-CM

## 2012-10-19 LAB — HEPATIC FUNCTION PANEL
Albumin: 3.9 g/dL (ref 3.5–5.2)
Alkaline Phosphatase: 51 U/L (ref 39–117)
Total Protein: 6.6 g/dL (ref 6.0–8.3)

## 2012-10-19 LAB — LIPID PANEL
Cholesterol: 187 mg/dL (ref 0–200)
LDL Cholesterol: 125 mg/dL — ABNORMAL HIGH (ref 0–99)
Triglycerides: 73 mg/dL (ref 0.0–149.0)
VLDL: 14.6 mg/dL (ref 0.0–40.0)

## 2012-10-19 LAB — BASIC METABOLIC PANEL
Chloride: 107 mEq/L (ref 96–112)
Creatinine, Ser: 0.8 mg/dL (ref 0.4–1.5)

## 2012-10-20 NOTE — Progress Notes (Signed)
Quick Note:  Please make copy of labs for patient visit. ______ 

## 2012-10-21 ENCOUNTER — Encounter: Payer: Self-pay | Admitting: Cardiology

## 2012-10-21 ENCOUNTER — Ambulatory Visit (INDEPENDENT_AMBULATORY_CARE_PROVIDER_SITE_OTHER): Payer: Medicare Other | Admitting: Cardiology

## 2012-10-21 VITALS — BP 118/72 | HR 70 | Ht 76.0 in | Wt 230.0 lb

## 2012-10-21 DIAGNOSIS — I4891 Unspecified atrial fibrillation: Secondary | ICD-10-CM

## 2012-10-21 DIAGNOSIS — E7800 Pure hypercholesterolemia, unspecified: Secondary | ICD-10-CM | POA: Diagnosis not present

## 2012-10-21 DIAGNOSIS — G629 Polyneuropathy, unspecified: Secondary | ICD-10-CM | POA: Insufficient documentation

## 2012-10-21 DIAGNOSIS — I480 Paroxysmal atrial fibrillation: Secondary | ICD-10-CM

## 2012-10-21 NOTE — Patient Instructions (Addendum)
Your physician recommends that you continue on your current medications as directed. Please refer to the Current Medication list given to you today.  Your physician wants you to follow-up in: 4 months with fasting labs (lp/bmet/hfp) You will receive a reminder letter in the mail two months in advance. If you don't receive a letter, please call our office to schedule the follow-up appointment.  

## 2012-10-21 NOTE — Progress Notes (Signed)
Wesley [name]. Date of Birth:  August 18, 1940 [facility] [address] [place], [place]  [address] [phone]  Fax   [phone]  HPI: This pleasant 72 year old gentleman is seen for a scheduled four-month followup office visit. He has a past history of atrial fibrillation but had successful ablation of his atrial fibrillation on 02/19/10 in [place]. Following his ablation he underwent successful cardioversion back in [place] in August 2011 and since then has remained in normal sinus rhythm. He is no longer on Coumadin. He does take a 325 mg aspirin daily. Patient has a past history of hypercholesterolemia.  Since we last saw the patient he has developed symptoms of peripheral neuropathy.   Current Outpatient Prescriptions  Medication Sig Dispense Refill  . Ascorbic Acid (VITAMIN C) 1000 MG tablet Take 1,000 mg by mouth daily.        Marland Kitchen aspirin 325 MG tablet Take 325 mg by mouth daily.        Marland Kitchen b complex vitamins capsule Take 1 capsule by mouth daily.        . Cyanocobalamin 5000 MCG SUBL Place under the tongue daily.      Marland Kitchen esomeprazole (NEXIUM) 40 MG capsule Take 1 capsule (40 mg total) by mouth daily before breakfast.  90 capsule  3  . fish oil-omega-3 fatty acids 1000 MG capsule Take 1 g by mouth daily.        . Loperamide-Simethicone 2-125 MG TABS Take by mouth 2 (two) times daily.      . metoprolol succinate (TOPROL-XL) 50 MG 24 hr tablet Take 1 tablet (50 mg total) by mouth daily.  90 tablet  3  . Multiple Vitamin (MULTIVITAMIN) tablet Take 1 tablet by mouth daily.        . OMEGA-3 KRILL OIL 300 MG CAPS Take by mouth daily.       No current facility-administered medications for this visit.    No Known Allergies  Patient Active Problem List  Diagnosis  . OBSTRUCTIVE SLEEP APNEA  . Paroxysmal atrial fibrillation  . Hypercholesterolemia  . Shoulder pain, right  . Rotator cuff syndrome of right shoulder  . Mitral valve prolapse  .  Heel pain  . Plantar fasciitis  . Pain in joint, ankle and foot    History  Smoking status  . Former Smoker  . Quit date: 07/27/1969  Smokeless tobacco  . Never Used    History  Alcohol Use  . Yes    Comment: 1-2 most days    Family History  Problem Relation Age of Onset  . Prostate cancer Father   . Arthritis Father   . Cancer Father   . Heart failure Mother   . Hypertension Mother     Review of Systems: The patient denies any heat or cold intolerance.  No weight gain or weight loss.  The patient denies headaches or blurry vision.  There is no cough or sputum production.  The patient denies dizziness.  There is no hematuria or hematochezia.  The patient denies any muscle aches or arthritis.  The patient denies any rash.  The patient denies frequent falling or instability.  There is no history of depression or anxiety.  All other systems were reviewed and are negative.   Physical Exam: Filed Vitals:   10/21/12 0853  BP: 118/72  Pulse: 70   the general appearance reveals a well-developed well-nourished tall gentleman in no distress.The head and neck exam reveals pupils equal and reactive.  Extraocular movements are full.  There is no scleral icterus.  The mouth and pharynx are normal.  The neck is supple.  The carotids reveal no bruits.  The jugular venous pressure is normal.  The  thyroid is not enlarged.  There is no lymphadenopathy.  The chest is clear to percussion and auscultation.  There are no rales or rhonchi.  Expansion of the chest is symmetrical.  The precordium is quiet.  The first heart sound is normal.  The second heart sound is physiologically split.  There is no murmur gallop rub or click.  There is no abnormal lift or heave.  The abdomen is soft and nontender.  The bowel sounds are normal.  The liver and spleen are not enlarged.  There are no abdominal masses.  There are no abdominal bruits.  Extremities reveal good pedal pulses.  There is no phlebitis or edema.   There is no cyanosis or clubbing.  Strength is normal and symmetrical in all extremities.  There is no lateralizing weakness.  There are no sensory deficits.  The skin is warm and dry.  There is no rash.  Recent lab work was reviewed and it is satisfactory except for slightly higher LDL.  He is not on statin therapy.  He will work harder on careful diet and increase exercise    Assessment / Plan:  Continue in 4 months for followup office visit and get fasting lab work ahead of time

## 2012-10-21 NOTE — Assessment & Plan Note (Signed)
The patient has been diagnosed with peripheral neuropathy.  Dr. Timothy Lasso syndrome to Dr. Yetta Barre, neurosurgeon.. the patient had an EMG which showed peripheral neuropathy and he then saw Dr. Anne Hahn who diagnosed peripheral neuropathy possibly related to borderline low B12 level and possibly also related to a long history of very moderate but daily alcohol intake.  The patient has essentially stopped drinking alcohol.  He never drank much.  The patient is also taking sublingual vitamin B12 tablets 5000 mcg daily.

## 2012-10-21 NOTE — Assessment & Plan Note (Signed)
The patient has had no recurrent atrial fibrillation. 

## 2012-10-27 ENCOUNTER — Telehealth: Payer: Self-pay | Admitting: Cardiology

## 2012-10-27 DIAGNOSIS — E7800 Pure hypercholesterolemia, unspecified: Secondary | ICD-10-CM | POA: Diagnosis not present

## 2012-10-27 DIAGNOSIS — Z125 Encounter for screening for malignant neoplasm of prostate: Secondary | ICD-10-CM | POA: Diagnosis not present

## 2012-10-27 NOTE — Telephone Encounter (Signed)
Left message to call back  

## 2012-10-27 NOTE — Telephone Encounter (Signed)
New problem    Pt needs immunization information and would like for you to call him

## 2012-10-27 NOTE — Telephone Encounter (Signed)
Requested paper chart.

## 2012-11-01 ENCOUNTER — Telehealth: Payer: Self-pay | Admitting: Cardiology

## 2012-11-01 NOTE — Telephone Encounter (Signed)
F/u   Per pt melinda told him would have his chart on Monday-wanting to know if  any other immunizations that he didn't have were found

## 2012-11-01 NOTE — Telephone Encounter (Signed)
Found patient's chart on Melinda's desk and went through every page in the chart to check for immunizations.  I have paper clipped the pages that have immunization information on them.  Spoke with patient who specifically wants to know if he has ever had a pneumonia vaccine.  No documentation of this vaccine was found in chart.  Patient verbalized understanding and thanked me for my help.

## 2012-11-01 NOTE — Telephone Encounter (Signed)
New problem. 

## 2012-11-01 NOTE — Telephone Encounter (Signed)
LMTCB.  Have reviewed patient's paper chart for immunization history.  Will await patient's return call.

## 2012-11-01 NOTE — Telephone Encounter (Signed)
Follow up   Pt stated he was returning Wesley [name] phone call.

## 2012-11-03 DIAGNOSIS — R197 Diarrhea, unspecified: Secondary | ICD-10-CM | POA: Diagnosis not present

## 2012-11-03 DIAGNOSIS — Z0000 Encounter for general adult medical examination without abnormal findings: Secondary | ICD-10-CM | POA: Diagnosis not present

## 2012-11-03 DIAGNOSIS — G4733 Obstructive sleep apnea (adult) (pediatric): Secondary | ICD-10-CM | POA: Diagnosis not present

## 2012-11-03 DIAGNOSIS — Z23 Encounter for immunization: Secondary | ICD-10-CM | POA: Diagnosis not present

## 2012-11-03 DIAGNOSIS — IMO0002 Reserved for concepts with insufficient information to code with codable children: Secondary | ICD-10-CM | POA: Diagnosis not present

## 2012-11-03 DIAGNOSIS — G609 Hereditary and idiopathic neuropathy, unspecified: Secondary | ICD-10-CM | POA: Diagnosis not present

## 2012-11-03 DIAGNOSIS — N401 Enlarged prostate with lower urinary tract symptoms: Secondary | ICD-10-CM | POA: Diagnosis not present

## 2012-11-03 DIAGNOSIS — M459 Ankylosing spondylitis of unspecified sites in spine: Secondary | ICD-10-CM | POA: Diagnosis not present

## 2012-11-03 DIAGNOSIS — E7800 Pure hypercholesterolemia, unspecified: Secondary | ICD-10-CM | POA: Diagnosis not present

## 2012-11-22 ENCOUNTER — Ambulatory Visit (INDEPENDENT_AMBULATORY_CARE_PROVIDER_SITE_OTHER): Payer: Medicare Other | Admitting: Sports Medicine

## 2012-11-22 ENCOUNTER — Ambulatory Visit
Admission: RE | Admit: 2012-11-22 | Discharge: 2012-11-22 | Disposition: A | Discharge status: Home / Self Care | Payer: Medicare Other | Source: Ambulatory Visit | Attending: Sports Medicine | Admitting: Sports Medicine

## 2012-11-22 VITALS — BP 119/73 | Ht 76.0 in | Wt 225.0 lb

## 2012-11-22 DIAGNOSIS — M79669 Pain in unspecified lower leg: Secondary | ICD-10-CM | POA: Insufficient documentation

## 2012-11-22 DIAGNOSIS — G609 Hereditary and idiopathic neuropathy, unspecified: Secondary | ICD-10-CM | POA: Diagnosis not present

## 2012-11-22 DIAGNOSIS — M47812 Spondylosis without myelopathy or radiculopathy, cervical region: Secondary | ICD-10-CM | POA: Diagnosis not present

## 2012-11-22 DIAGNOSIS — M79609 Pain in unspecified limb: Secondary | ICD-10-CM

## 2012-11-22 DIAGNOSIS — M79661 Pain in right lower leg: Secondary | ICD-10-CM

## 2012-11-22 DIAGNOSIS — M542 Cervicalgia: Secondary | ICD-10-CM

## 2012-11-22 DIAGNOSIS — G629 Polyneuropathy, unspecified: Secondary | ICD-10-CM

## 2012-11-22 DIAGNOSIS — M722 Plantar fascial fibromatosis: Secondary | ICD-10-CM | POA: Diagnosis not present

## 2012-11-22 DIAGNOSIS — M79671 Pain in right foot: Secondary | ICD-10-CM

## 2012-11-22 MED ORDER — GABAPENTIN 300 MG PO CAPS: 300.0000 mg | ORAL_CAPSULE | Freq: Every day | ORAL | Status: DC

## 2012-11-22 NOTE — Patient Instructions (Addendum)
Have x-rays of your cervical spine done  Start gabapentin 300 mg at bedtime  Please follow up in 1 month  Thank you for seeing [place] today!

## 2012-11-22 NOTE — Assessment & Plan Note (Signed)
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

## 2012-11-22 NOTE — Assessment & Plan Note (Signed)
This may be a significant cause of his pain  Give a trial of gabapentin at HS for 1 month  Reck after that

## 2012-11-22 NOTE — Assessment & Plan Note (Signed)
Keep up HEP and stretches  There is an element of this even if this is not total cause of your pain

## 2012-11-22 NOTE — Progress Notes (Signed)
Patient ID: Wesley Peper., male   DOB: 08/20/1940, 72 y.o.   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

## 2012-11-22 NOTE — Assessment & Plan Note (Signed)
With atypical pain pattern I suspect a portion of this is related to his neuropathy  A secondary cause of distal pain could be the degree of varicosities and swelling that he maintains in his RT leg  We will try compression over the calf for this

## 2012-11-23 ENCOUNTER — Encounter: Payer: Self-pay | Admitting: Sports Medicine

## 2012-11-23 ENCOUNTER — Telehealth: Payer: Self-pay | Admitting: *Deleted

## 2012-11-23 NOTE — Telephone Encounter (Signed)
Message copied by Jacki Cones C on Wed Nov 23, 2012  2:05 PM ------      Message from: Enid Baas      Created: Wed Nov 23, 2012  2:00 PM       Let him know neck films showed some mild arthritis.            No evidence of ankylosing spondylitis in neck.      Keep up plan we decided. ------

## 2012-11-23 NOTE — Telephone Encounter (Signed)
Spoke with pt- gave him x-ray results.

## 2012-12-06 ENCOUNTER — Other Ambulatory Visit: Payer: Self-pay

## 2012-12-06 NOTE — Telephone Encounter (Signed)
Approved [facility] per phone call [phone]

## 2012-12-16 ENCOUNTER — Ambulatory Visit (HOSPITAL_BASED_OUTPATIENT_CLINIC_OR_DEPARTMENT_OTHER): Payer: Medicare Other | Admitting: Sports Medicine

## 2012-12-16 VITALS — BP 124/70 | Ht 76.0 in | Wt 225.0 lb

## 2012-12-16 DIAGNOSIS — G629 Polyneuropathy, unspecified: Secondary | ICD-10-CM

## 2012-12-16 DIAGNOSIS — M722 Plantar fascial fibromatosis: Secondary | ICD-10-CM | POA: Diagnosis not present

## 2012-12-16 DIAGNOSIS — M79669 Pain in unspecified lower leg: Secondary | ICD-10-CM

## 2012-12-16 DIAGNOSIS — M79609 Pain in unspecified limb: Secondary | ICD-10-CM

## 2012-12-16 DIAGNOSIS — G609 Hereditary and idiopathic neuropathy, unspecified: Secondary | ICD-10-CM | POA: Diagnosis not present

## 2012-12-16 NOTE — Patient Instructions (Addendum)
Let's try increase gabapentin to 600 at night  Let me know after 1 week if this is helping  If so we would stay at this for 1 month and see if more improvement  If not enough change we will add a day time dosage  Keep up plantar fascia exercises most days

## 2012-12-16 NOTE — Assessment & Plan Note (Signed)
gradually increase his dose of gabapentin since he seems to get less symptoms from his neuropathy using this

## 2012-12-16 NOTE — Assessment & Plan Note (Signed)
First seems related partially to his varicosities  Continue using compression socks  He had some skin reaction to the latex material in the body helix so I'll have him to this over his sock when he uses it

## 2012-12-16 NOTE — Assessment & Plan Note (Signed)
I wonder if there is some neuropathic component to his plantar fascia pain particularly since it hurts so much less after low-dose gabapentin  Now he can drive longer distances with no real foot pain  We will try to increase her gabapentin to 600 at night to see if this helps more

## 2012-12-16 NOTE — Progress Notes (Signed)
Patient ID: Wesley [name]., male   DOB: 09/29/1940, 72 y.o.   MRN: [id number]  At least 60% less sxs in foot and with tingling since gabapentin 300 at hs Still uses arch strap Does PF exercises Dr. Anne Hahn follows him for a peripheral neuropathy without a known cause  Exercises also make his feet feel somewhat better We did a cut out pad on the right heel because of some os calcis injury  Physical examination  No acute distress Blood pressure is 124/76 I checked blood pressure in both lower extremities and it was equal to that of the arm  Insertion of plantar fascia is not that tender Right heel still shows some pain right over the os calcis Great toe movement is good

## 2012-12-22 ENCOUNTER — Ambulatory Visit: Payer: Medicare Other | Admitting: Sports Medicine

## 2012-12-27 ENCOUNTER — Other Ambulatory Visit: Payer: Self-pay | Admitting: *Deleted

## 2012-12-27 MED ORDER — GABAPENTIN 300 MG PO CAPS: 300.0000 mg | ORAL_CAPSULE | Freq: Three times a day (TID) | ORAL | Status: DC

## 2013-01-16 ENCOUNTER — Other Ambulatory Visit: Payer: Self-pay | Admitting: Cardiology

## 2013-02-17 ENCOUNTER — Other Ambulatory Visit (INDEPENDENT_AMBULATORY_CARE_PROVIDER_SITE_OTHER): Payer: Medicare Other

## 2013-02-17 DIAGNOSIS — E7800 Pure hypercholesterolemia, unspecified: Secondary | ICD-10-CM

## 2013-02-17 LAB — HEPATIC FUNCTION PANEL
ALT: 19 U/L (ref 0–53)
AST: 24 U/L (ref 0–37)
Alkaline Phosphatase: 51 U/L (ref 39–117)
Bilirubin, Direct: 0.1 mg/dL (ref 0.0–0.3)
Total Bilirubin: 1.1 mg/dL (ref 0.3–1.2)

## 2013-02-17 LAB — BASIC METABOLIC PANEL
BUN: 15 mg/dL (ref 6–23)
Calcium: 9.4 mg/dL (ref 8.4–10.5)
GFR: 93.35 mL/min (ref >60.00)
Glucose, Bld: 91 mg/dL (ref 70–99)

## 2013-02-17 LAB — LIPID PANEL
Cholesterol: 208 mg/dL — ABNORMAL HIGH (ref 0–200)
Total CHOL/HDL Ratio: 4

## 2013-02-17 NOTE — Progress Notes (Signed)
Quick Note:  Please make copy of labs for patient visit. ______ 

## 2013-02-20 ENCOUNTER — Encounter: Payer: Self-pay | Admitting: Cardiology

## 2013-02-20 ENCOUNTER — Ambulatory Visit (INDEPENDENT_AMBULATORY_CARE_PROVIDER_SITE_OTHER): Payer: Medicare Other | Admitting: Cardiology

## 2013-02-20 VITALS — BP 114/62 | HR 60 | Ht 76.0 in | Wt 230.0 lb

## 2013-02-20 DIAGNOSIS — I059 Rheumatic mitral valve disease, unspecified: Secondary | ICD-10-CM | POA: Diagnosis not present

## 2013-02-20 DIAGNOSIS — I4891 Unspecified atrial fibrillation: Secondary | ICD-10-CM | POA: Diagnosis not present

## 2013-02-20 DIAGNOSIS — E7800 Pure hypercholesterolemia, unspecified: Secondary | ICD-10-CM | POA: Diagnosis not present

## 2013-02-20 DIAGNOSIS — I480 Paroxysmal atrial fibrillation: Secondary | ICD-10-CM

## 2013-02-20 DIAGNOSIS — I341 Nonrheumatic mitral (valve) prolapse: Secondary | ICD-10-CM

## 2013-02-20 NOTE — Progress Notes (Signed)
Wesley [name]. Date of Birth:  12/30/1940 [facility] [address] [place], [place]  [address] [phone]  Fax   [phone]  HPI: This pleasant 72 year old gentleman is seen for a scheduled four-month followup office visit. He has a past history of atrial fibrillation but had successful ablation of his atrial fibrillation on 02/19/10 in [place]. Following his ablation he underwent successful cardioversion back in [place] in August 2011 and since then has remained in normal sinus rhythm. He is no longer on Coumadin. He does take a 325 mg aspirin daily. Patient has a past history of hypercholesterolemia. Since we last saw the patient he has developed symptoms of peripheral neuropathy.  He has seen Dr. Anne Hahn who suggested that even very moderate alcohol intake over a long period of time could possibly be a contributing factor.  For this reason the patient has essentially stopped drinking almost completely.  He never had been a heavy drinker.   Current Outpatient Prescriptions  Medication Sig Dispense Refill  . Alpha-Lipoic Acid 600 MG CAPS Take 600 mg by mouth daily.      . Ascorbic Acid (VITAMIN C) 1000 MG tablet Take 1,000 mg by mouth daily.        Marland Kitchen aspirin 325 MG tablet Take 325 mg by mouth daily.        Marland Kitchen b complex vitamins capsule Take 1 capsule by mouth daily.        . Cyanocobalamin 5000 MCG SUBL Place under the tongue daily.      . fish oil-omega-3 fatty acids 1000 MG capsule Take 1 g by mouth daily.        Marland Kitchen gabapentin (NEURONTIN) 300 MG capsule Take 600 mg by mouth at bedtime.      . Loperamide-Simethicone 2-125 MG TABS Take by mouth 2 (two) times daily.      . metoprolol succinate (TOPROL-XL) 50 MG 24 hr tablet Take 1 tablet (50 mg total) by mouth daily.  90 tablet  3  . Multiple Vitamin (MULTIVITAMIN) tablet Take 1 tablet by mouth daily.        Marland Kitchen NEXIUM 40 MG capsule TAKE 1 CAPSULE (40 MG TOTAL) BY MOUTH DAILY BEFORE BREAKFAST  90  capsule  2  . OMEGA-3 KRILL OIL 300 MG CAPS Take by mouth daily.      Marland Kitchen pyridOXINE (VITAMIN B-6) 50 MG tablet Take 50 mg by mouth 2 (two) times daily.       No current facility-administered medications for this visit.    No Known Allergies  Patient Active Problem List   Diagnosis Date Noted  . Calf pain 11/22/2012  . 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/22/2012  . Peripheral neuropathy 10/21/2012  . Pain in joint, ankle and foot 09/21/2012  . Heel pain 06/07/2012  . Plantar fasciitis 06/07/2012  . Mitral valve prolapse 06/03/2011  . Shoulder pain, right 04/03/2011  . Rotator cuff syndrome of right shoulder 04/03/2011  . Paroxysmal atrial fibrillation 01/20/2011  . Hypercholesterolemia 01/20/2011  . OBSTRUCTIVE SLEEP APNEA 10/08/2009    History  Smoking status  . Former Smoker  . Quit date: 07/27/1969  Smokeless tobacco  . Never Used    History  Alcohol Use  . Yes    Comment: 1-2 most days    Family History  Problem Relation Age of Onset  . Prostate cancer Father   . Arthritis Father   . Cancer Father   . Heart failure Mother   . Hypertension Mother  Review of Systems: The patient denies any heat or cold intolerance.  No weight gain or weight loss.  The patient denies headaches or blurry vision.  There is no cough or sputum production.  The patient denies dizziness.  There is no hematuria or hematochezia.  The patient denies any muscle aches or arthritis.  The patient denies any rash.  The patient denies frequent falling or instability.  There is no history of depression or anxiety.  All other systems were reviewed and are negative.   Physical Exam: Filed Vitals:   02/20/13 1423  BP: 114/62  Pulse: 60   the general appearance reveals a well-developed well-nourished tall gentleman in no distress.The head and neck exam reveals pupils equal and reactive.  Extraocular movements are full.  There is no scleral icterus.  The mouth and pharynx are normal.  The neck is supple.   The carotids reveal no bruits.  The jugular venous pressure is normal.  The  thyroid is not enlarged.  There is no lymphadenopathy.  The chest is clear to percussion and auscultation.  There are no rales or rhonchi.  Expansion of the chest is symmetrical.  The precordium is quiet.  The first heart sound is normal.  The second heart sound is physiologically split.  There is no murmur gallop rub or click.  There is no abnormal lift or heave.  The abdomen is soft and nontender.  The bowel sounds are normal.  The liver and spleen are not enlarged.  There are no abdominal masses.  There are no abdominal bruits.  Extremities reveal good pedal pulses.  There is no phlebitis or edema.  There is no cyanosis or clubbing.  Strength is normal and symmetrical in all extremities.  There is no lateralizing weakness.  There are no sensory deficits.  The skin is warm and dry.  There is no rash.  EKG today shows normal sinus rhythm and left anterior hemiblock and is unchanged since 10/07/11    Assessment / Plan: The patient is to continue same medication and be rechecked in 4 months for followup office visit lipid panel hepatic function panel and basal metabolic panel

## 2013-02-20 NOTE — Patient Instructions (Signed)
Your physician recommends that you return FASTING for lab work in: 4 months (just prior to seeing Dr. Patty Sermons) - lipid/liver/bmp  Your physician wants you to follow-up in: 4 months with Dr. Rodney Langton will receive a reminder letter in the mail two months in advance. If you don't receive a letter, please call our office to schedule the follow-up appointment.  Your physician recommends that you continue on your current medications as directed. Please refer to the Current Medication list given to you today.

## 2013-02-20 NOTE — Assessment & Plan Note (Signed)
The patient has not been as careful with his diet.  Since he has cut back on his alcohol he thinks he has been eating more sweets and desserts.  His LDL is elevated 125.  He does not want to go on statin drugs.  He will continue to watch his diet carefully.  His weight today is unchanged from previous visit

## 2013-02-20 NOTE — Assessment & Plan Note (Signed)
The patient has not had any chest pain or palpitations relating to his mitral valve prolapse.  His prolapse is very mild

## 2013-02-20 NOTE — Assessment & Plan Note (Signed)
The patient has had no recurrence of his atrial fibrillation.  He remains on aspirin and metoprolol

## 2013-03-01 ENCOUNTER — Other Ambulatory Visit: Payer: Self-pay

## 2013-03-08 ENCOUNTER — Other Ambulatory Visit: Payer: Self-pay | Admitting: Sports Medicine

## 2013-03-08 ENCOUNTER — Other Ambulatory Visit: Payer: Self-pay | Admitting: *Deleted

## 2013-03-08 MED ORDER — GABAPENTIN 300 MG PO CAPS: 600.0000 mg | ORAL_CAPSULE | Freq: Every day | ORAL | Status: DC

## 2013-03-20 ENCOUNTER — Encounter: Payer: Self-pay | Admitting: Neurology

## 2013-03-20 DIAGNOSIS — G63 Polyneuropathy in diseases classified elsewhere: Secondary | ICD-10-CM

## 2013-03-20 DIAGNOSIS — R209 Unspecified disturbances of skin sensation: Secondary | ICD-10-CM

## 2013-03-20 DIAGNOSIS — R6889 Other general symptoms and signs: Secondary | ICD-10-CM

## 2013-03-21 ENCOUNTER — Ambulatory Visit (INDEPENDENT_AMBULATORY_CARE_PROVIDER_SITE_OTHER): Payer: Medicare Other | Admitting: Neurology

## 2013-03-21 ENCOUNTER — Encounter: Payer: Self-pay | Admitting: Neurology

## 2013-03-21 VITALS — BP 116/74 | HR 64 | Wt 234.0 lb

## 2013-03-21 DIAGNOSIS — G63 Polyneuropathy in diseases classified elsewhere: Secondary | ICD-10-CM

## 2013-03-21 DIAGNOSIS — D518 Other vitamin B12 deficiency anemias: Secondary | ICD-10-CM | POA: Diagnosis not present

## 2013-03-21 NOTE — Progress Notes (Signed)
Reason for visit: Peripheral neuropathy  Wesley R Parker Wherley. is an 72 y.o. male  History of present illness:  Wesley [name] is a 72 year old right-handed white male with a history of a B12 deficiency, and a history of a peripheral neuropathy. The patient is on oral B12 supplementation. The patient indicates that he has some discomfort in the feet while driving, and while walking. The patient went on gabapentin with good improvement in the pain. The patient denies any significant balance issues, and he denies falls. The patient overall is doing well. The patient has markedly reduced his alcohol intake. The patient returns for an evaluation.  Past Medical History  Diagnosis Date  . OSA (obstructive sleep apnea)     moderate OSA NPSG 10/24/09 - AHO 21.8/hr  . Ankylosing spondylitis   . A-fib     cardioversion x2  . MVP (mitral valve prolapse)     WITH A MIDSYSTOLIC CLICK  . Irregular heart beat   . Hemorrhoids   . Peripheral neuropathy   . GERD (gastroesophageal reflux disease)   . Vitamin B12 deficiency     Past Surgical History  Procedure Laterality Date  . Inguinal hernia repair    . Venous ligation      for varices left lower leg  . Colonoscopy    . Ablation      Family History  Problem Relation Age of Onset  . Prostate cancer Father   . Arthritis Father   . Cancer Father   . Heart failure Mother   . Hypertension Mother     Social history:  reports that he quit smoking about 43 years ago. He has never used smokeless tobacco. He reports that  drinks alcohol. His drug history is not on file.   No Known Allergies  Medications:  Current Outpatient Prescriptions on File Prior to Visit  Medication Sig Dispense Refill  . Alpha-Lipoic Acid 600 MG CAPS Take 600 mg by mouth daily.      . Ascorbic Acid (VITAMIN C) 1000 MG tablet Take 1,000 mg by mouth daily.        Marland Kitchen aspirin 325 MG tablet Take 325 mg by mouth daily.        Marland Kitchen b complex vitamins capsule Take 1 capsule by mouth  daily.        . Cyanocobalamin 5000 MCG SUBL Place under the tongue daily.      . fish oil-omega-3 fatty acids 1000 MG capsule Take 1 g by mouth daily.        Marland Kitchen gabapentin (NEURONTIN) 300 MG capsule Take 2 capsules (600 mg total) by mouth at bedtime.  180 capsule  1  . Loperamide-Simethicone 2-125 MG TABS Take by mouth 2 (two) times daily.      . metoprolol succinate (TOPROL-XL) 50 MG 24 hr tablet Take 1 tablet (50 mg total) by mouth daily.  90 tablet  3  . Multiple Vitamin (MULTIVITAMIN) tablet Take 1 tablet by mouth daily.        Marland Kitchen NEXIUM 40 MG capsule TAKE 1 CAPSULE (40 MG TOTAL) BY MOUTH DAILY BEFORE BREAKFAST  90 capsule  2  . OMEGA-3 KRILL OIL 300 MG CAPS Take by mouth daily.      Marland Kitchen pyridOXINE (VITAMIN B-6) 50 MG tablet Take 50 mg by mouth 2 (two) times daily.       No current facility-administered medications on file prior to visit.    ROS:  Out of a complete 14 system review of symptoms, the  patient complains only of the following symptoms, and all other reviewed systems are negative.  Numbness in the feet  Blood pressure 116/74, pulse 64, weight 234 lb (106.142 kg).  Physical Exam  General: The patient is alert and cooperative at the time of the examination.  Skin: No significant peripheral edema is noted.   Neurologic Exam  Cranial nerves: Facial symmetry is present. Speech is normal, no aphasia or dysarthria is noted. Extraocular movements are full. Visual fields are full.  Motor: The patient has good strength in all 4 extremities.  Coordination: The patient has good finger-nose-finger and heel-to-shin bilaterally.  Gait and station: The patient has a normal gait. Tandem gait is normal. Romberg is negative. No drift is seen.  Reflexes: Deep tendon reflexes are symmetric, but are depressed.   Assessment/Plan:  One. Peripheral neuropathy  The patient will have blood work done today to ensure that the B12 supplementation is adequate. The patient otherwise will  followup if needed. The patient seems to be gaining good improvement with the gabapentin.  Marlan Palau MD 03/21/2013 11:40 AM  [facility] [address] [place], [place] [address]  Phone [phone] Fax [phone]

## 2013-03-28 NOTE — Progress Notes (Signed)
Quick Note:  LMVM for pt labs normal. To call back if questions. ______

## 2013-03-29 ENCOUNTER — Telehealth: Payer: Self-pay | Admitting: *Deleted

## 2013-03-29 NOTE — Telephone Encounter (Signed)
Pt called asking about what to do re: to his oral supplementation.  I instructed that he is to continue what he is doing (oral SL B 12 supplementation) as MMA test was within normal limits.  He verbalized understanding.

## 2013-05-11 DIAGNOSIS — Z23 Encounter for immunization: Secondary | ICD-10-CM | POA: Diagnosis not present

## 2013-06-01 ENCOUNTER — Other Ambulatory Visit: Payer: Self-pay

## 2013-06-09 DIAGNOSIS — L820 Inflamed seborrheic keratosis: Secondary | ICD-10-CM | POA: Diagnosis not present

## 2013-06-09 DIAGNOSIS — D235 Other benign neoplasm of skin of trunk: Secondary | ICD-10-CM | POA: Diagnosis not present

## 2013-06-09 DIAGNOSIS — L819 Disorder of pigmentation, unspecified: Secondary | ICD-10-CM | POA: Diagnosis not present

## 2013-06-20 ENCOUNTER — Ambulatory Visit: Payer: Medicare Other | Admitting: Cardiology

## 2013-06-20 ENCOUNTER — Other Ambulatory Visit (INDEPENDENT_AMBULATORY_CARE_PROVIDER_SITE_OTHER): Payer: Medicare Other

## 2013-06-20 DIAGNOSIS — I4891 Unspecified atrial fibrillation: Secondary | ICD-10-CM | POA: Diagnosis not present

## 2013-06-20 DIAGNOSIS — E7800 Pure hypercholesterolemia, unspecified: Secondary | ICD-10-CM | POA: Diagnosis not present

## 2013-06-20 DIAGNOSIS — I480 Paroxysmal atrial fibrillation: Secondary | ICD-10-CM

## 2013-06-20 LAB — LIPID PANEL
HDL: 57.1 mg/dL (ref >39.00)
Total CHOL/HDL Ratio: 4
VLDL: 14.6 mg/dL (ref 0.0–40.0)

## 2013-06-20 LAB — BASIC METABOLIC PANEL
BUN: 14 mg/dL (ref 6–23)
CO2: 27 mEq/L (ref 19–32)
Chloride: 106 mEq/L (ref 96–112)
Glucose, Bld: 92 mg/dL (ref 70–99)
Potassium: 3.9 mEq/L (ref 3.5–5.1)
Sodium: 140 mEq/L (ref 135–145)

## 2013-06-20 LAB — HEPATIC FUNCTION PANEL
Bilirubin, Direct: 0.1 mg/dL (ref 0.0–0.3)
Total Bilirubin: 1.2 mg/dL (ref 0.3–1.2)

## 2013-06-20 NOTE — Progress Notes (Signed)
Quick Note:  Please make copy of labs for patient visit. ______ 

## 2013-06-28 DIAGNOSIS — Z8042 Family history of malignant neoplasm of prostate: Secondary | ICD-10-CM | POA: Diagnosis not present

## 2013-06-28 DIAGNOSIS — N401 Enlarged prostate with lower urinary tract symptoms: Secondary | ICD-10-CM | POA: Diagnosis not present

## 2013-06-29 DIAGNOSIS — J069 Acute upper respiratory infection, unspecified: Secondary | ICD-10-CM | POA: Diagnosis not present

## 2013-06-29 DIAGNOSIS — R05 Cough: Secondary | ICD-10-CM | POA: Diagnosis not present

## 2013-07-11 ENCOUNTER — Ambulatory Visit (INDEPENDENT_AMBULATORY_CARE_PROVIDER_SITE_OTHER): Payer: Medicare Other | Admitting: Cardiology

## 2013-07-11 ENCOUNTER — Encounter: Payer: Self-pay | Admitting: Cardiology

## 2013-07-11 VITALS — BP 97/67 | HR 68 | Ht 76.0 in | Wt 230.8 lb

## 2013-07-11 DIAGNOSIS — I341 Nonrheumatic mitral (valve) prolapse: Secondary | ICD-10-CM

## 2013-07-11 DIAGNOSIS — E7800 Pure hypercholesterolemia, unspecified: Secondary | ICD-10-CM | POA: Diagnosis not present

## 2013-07-11 DIAGNOSIS — I4891 Unspecified atrial fibrillation: Secondary | ICD-10-CM

## 2013-07-11 DIAGNOSIS — I480 Paroxysmal atrial fibrillation: Secondary | ICD-10-CM

## 2013-07-11 DIAGNOSIS — G4733 Obstructive sleep apnea (adult) (pediatric): Secondary | ICD-10-CM | POA: Diagnosis not present

## 2013-07-11 DIAGNOSIS — I059 Rheumatic mitral valve disease, unspecified: Secondary | ICD-10-CM

## 2013-07-11 NOTE — Patient Instructions (Signed)
Your physician recommends that you continue on your current medications as directed. Please refer to the Current Medication list given to you today.  Your physician wants you to follow-up in: 4 months with fasting labs (lp/bmet/hfp) You will receive a reminder letter in the mail two months in advance. If you don't receive a letter, please call our office to schedule the follow-up appointment.  

## 2013-07-11 NOTE — Assessment & Plan Note (Signed)
LDL is higher.  He will work harder on diet and weight loss

## 2013-07-11 NOTE — Assessment & Plan Note (Signed)
No recurrence of atrial fibrillation

## 2013-07-11 NOTE — Assessment & Plan Note (Signed)
No chest pain or shortness of breath.  Good exercise tolerance.  He works out on machines 3 days a week at [employer].

## 2013-07-11 NOTE — Progress Notes (Signed)
Wesley [name]. Date of Birth:  09/21/1940 [address] [place], [place]  [address] [phone]  Fax   [phone]  HPI: This pleasant 72 year old gentleman is seen for a scheduled four-month followup office visit. He has a past history of atrial fibrillation but had successful ablation of his atrial fibrillation on 02/19/10 in [place]. Following his ablation he underwent successful cardioversion back in [place] in August 2011 and since then has remained in normal sinus rhythm. He is no longer on Coumadin. He does take a 325 mg aspirin daily. Patient has a past history of hypercholesterolemia. Since we last saw the patient he has developed symptoms of peripheral neuropathy.  He has seen Dr. Anne Hahn who suggested that even very moderate alcohol intake over a long period of time could possibly be a contributing factor.  For this reason the patient has essentially stopped drinking almost completely.  He never had been a heavy drinker.  Since last visit he's had no new cardiac symptoms.  He feels that his CPAP machine has helped him a great deal   Current Outpatient Prescriptions  Medication Sig Dispense Refill  . Alpha-Lipoic Acid 600 MG CAPS Take 600 mg by mouth daily.      . Ascorbic Acid (VITAMIN C) 1000 MG tablet Take 1,000 mg by mouth daily.        Marland Kitchen aspirin 325 MG tablet Take 325 mg by mouth daily.        Marland Kitchen b complex vitamins capsule Take 1 capsule by mouth daily.        . Cyanocobalamin 5000 MCG SUBL Place under the tongue daily.      . fish oil-omega-3 fatty acids 1000 MG capsule Take 1 g by mouth daily.        Marland Kitchen gabapentin (NEURONTIN) 300 MG capsule Take 2 capsules (600 mg total) by mouth at bedtime.  180 capsule  1  . Loperamide-Simethicone 2-125 MG TABS Take by mouth 2 (two) times daily.      . metoprolol succinate (TOPROL-XL) 50 MG 24 hr tablet Take 1 tablet (50 mg total) by mouth daily.  90 tablet  3  . Multiple Vitamin (MULTIVITAMIN) tablet  Take 1 tablet by mouth daily.        Marland Kitchen NEXIUM 40 MG capsule TAKE 1 CAPSULE (40 MG TOTAL) BY MOUTH DAILY BEFORE BREAKFAST  90 capsule  2  . OMEGA-3 KRILL OIL 300 MG CAPS Take by mouth daily.      Marland Kitchen pyridOXINE (VITAMIN B-6) 50 MG tablet Take 50 mg by mouth 2 (two) times daily.       No current facility-administered medications for this visit.    No Known Allergies  Patient Active Problem List   Diagnosis Date Noted  . Calf pain 11/22/2012  . 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/22/2012  . Peripheral neuropathy 10/21/2012  . Pain in joint, ankle and foot 09/21/2012  . Disturbance of skin sensation 09/21/2012  . 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 09/21/2012  . Other general symptoms(780.99) 09/21/2012  . Heel pain 06/07/2012  . Plantar fasciitis 06/07/2012  . Mitral valve prolapse 06/03/2011  . Shoulder pain, right 04/03/2011  . Rotator cuff syndrome of right shoulder 04/03/2011  . Paroxysmal atrial fibrillation 01/20/2011  . Hypercholesterolemia 01/20/2011  . OBSTRUCTIVE SLEEP APNEA 10/08/2009    History  Smoking status  . Former Smoker  . Quit date: 07/27/1969  Smokeless tobacco  . Never Used    History  Alcohol Use  .  Yes    Comment: 1-2 most days    Family History  Problem Relation Age of Onset  . Prostate cancer Father   . Arthritis Father   . Cancer Father   . Heart failure Mother   . Hypertension Mother     Review of Systems: The patient denies any heat or cold intolerance.  No weight gain or weight loss.  The patient denies headaches or blurry vision.  There is no cough or sputum production.  The patient denies dizziness.  There is no hematuria or hematochezia.  The patient denies any muscle aches or arthritis.  The patient denies any rash.  The patient denies frequent falling or instability.  There is no history of depression or anxiety.  All other systems were reviewed and are negative.   Physical Exam: Filed Vitals:   07/11/13 1526  BP: 97/67   Pulse: 68   the general appearance reveals a well-developed well-nourished tall gentleman in no distress.The head and neck exam reveals pupils equal and reactive.  Extraocular movements are full.  There is no scleral icterus.  The mouth and pharynx are normal.  The neck is supple.  The carotids reveal no bruits.  The jugular venous pressure is normal.  The  thyroid is not enlarged.  There is no lymphadenopathy.  The chest is clear to percussion and auscultation.  There are no rales or rhonchi.  Expansion of the chest is symmetrical.  The precordium is quiet.  The first heart sound is normal.  The second heart sound is physiologically split.  There is no murmur gallop rub or click.  There is no abnormal lift or heave.  The abdomen is soft and nontender.  The bowel sounds are normal.  The liver and spleen are not enlarged.  There are no abdominal masses.  There are no abdominal bruits.  Extremities reveal good pedal pulses.  There is no phlebitis or edema.  There is no cyanosis or clubbing.  Strength is normal and symmetrical in all extremities.  There is no lateralizing weakness.  There are no sensory deficits.  The skin is warm and dry.  There is no rash.      Assessment / Plan: The patient is to continue same medication and be rechecked in 4 months for followup office visit lipid panel hepatic function panel and basal metabolic panel.  He prefers to get his labs ahead of time so that we can discuss them.

## 2013-07-12 DIAGNOSIS — N400 Enlarged prostate without lower urinary tract symptoms: Secondary | ICD-10-CM | POA: Diagnosis not present

## 2013-08-23 DIAGNOSIS — L259 Unspecified contact dermatitis, unspecified cause: Secondary | ICD-10-CM | POA: Diagnosis not present

## 2013-09-07 ENCOUNTER — Other Ambulatory Visit: Payer: Self-pay | Admitting: Cardiology

## 2013-11-02 ENCOUNTER — Telehealth: Payer: Self-pay | Admitting: *Deleted

## 2013-11-02 MED ORDER — GABAPENTIN 300 MG PO CAPS: 600.0000 mg | ORAL_CAPSULE | Freq: Every day | ORAL | Status: DC

## 2013-11-02 NOTE — Telephone Encounter (Signed)
Message copied by Ocie Bob on Thu Nov 02, 2013  2:46 PM ------      Message from: CERESI, MELANIE L      Created: Thu Nov 02, 2013  2:33 PM      Contact: refill request per patient       [facility]apentin 600 mg 90 [facility]  ------

## 2013-11-16 DIAGNOSIS — D485 Neoplasm of uncertain behavior of skin: Secondary | ICD-10-CM | POA: Diagnosis not present

## 2013-11-16 DIAGNOSIS — B351 Tinea unguium: Secondary | ICD-10-CM | POA: Diagnosis not present

## 2013-11-18 ENCOUNTER — Other Ambulatory Visit: Payer: Self-pay | Admitting: Cardiology

## 2013-11-22 ENCOUNTER — Telehealth: Payer: Self-pay | Admitting: Cardiology

## 2013-11-22 NOTE — Telephone Encounter (Signed)
Patient called to schedule follow up ov, states he is doing fine. Nothing available soon and he has a CPE with his PCP in July. Patient wanted to know if necessary to do both in July. Discussed with  Dr. Mare Ferrari and ok to just get copy of labs and if he is doing ok just put off appointment until August/september. Advised patient if any problems to call back.

## 2013-11-22 NOTE — Telephone Encounter (Signed)
New Message  Pt called to schedule April appt/ No appts until July// Offered PA or NP pt declined// requests a call back from nurse. Please assist

## 2013-11-27 MED ORDER — METOPROLOL SUCCINATE ER 50 MG PO TB24: 50.0000 mg | ORAL_TABLET | Freq: Every day | ORAL | Status: DC

## 2013-11-27 MED ORDER — ESOMEPRAZOLE MAGNESIUM 40 MG PO CPDR: DELAYED_RELEASE_CAPSULE | ORAL | Status: DC

## 2013-11-27 NOTE — Telephone Encounter (Signed)
Follow up ° ° ° ° °Returning Wesley [name]'s call °

## 2013-11-27 NOTE — Telephone Encounter (Signed)
Spoke with patient and he had a spell over the weekend of nausea and dizziness, thinks he was a little dehydrated. Patient has appointment at PCP tomorrow. Will call back Wednesday and follow up with me to discuss findings.

## 2013-11-27 NOTE — Telephone Encounter (Signed)
Left message to call back  

## 2013-11-28 DIAGNOSIS — R42 Dizziness and giddiness: Secondary | ICD-10-CM | POA: Diagnosis not present

## 2013-11-28 DIAGNOSIS — Z6828 Body mass index (BMI) 28.0-28.9, adult: Secondary | ICD-10-CM | POA: Diagnosis not present

## 2013-11-28 DIAGNOSIS — I4891 Unspecified atrial fibrillation: Secondary | ICD-10-CM | POA: Diagnosis not present

## 2013-11-29 ENCOUNTER — Ambulatory Visit (INDEPENDENT_AMBULATORY_CARE_PROVIDER_SITE_OTHER): Payer: Medicare Other | Admitting: Sports Medicine

## 2013-11-29 ENCOUNTER — Encounter: Payer: Self-pay | Admitting: Sports Medicine

## 2013-11-29 VITALS — BP 116/76 | Ht 76.0 in | Wt 227.0 lb

## 2013-11-29 DIAGNOSIS — G609 Hereditary and idiopathic neuropathy, unspecified: Secondary | ICD-10-CM

## 2013-11-29 DIAGNOSIS — G629 Polyneuropathy, unspecified: Secondary | ICD-10-CM

## 2013-11-29 NOTE — Assessment & Plan Note (Signed)
He has seen Dr. Thurmond Butts in neurology and I don't think we have a specific cause for his neuropathy at this time  I think it would be okay to experiment with increasing the dose of the gabapentin to see if he gets better relief of symptoms  I encouraged him to change shoe wear so he doesn't get pressure particularly on his right foot where sensation is significantly decreased  I also want him to work on balance exercises Even with his neuropathy if he improves his core strength and stability at his ankles I think he is at less risk of falling

## 2013-11-29 NOTE — Progress Notes (Signed)
Patient ID: Wesley [name]., male   DOB: [birth year], 73 y.o.   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 Jannifer Franklin in neurology who continued him on the gabapentin He does get a good response to gabapentin but has remained at relatively low dose at 600 mg at night  He also notes some increased difficulty with balance  Examination No acute distress  BP 116/76  Ht 6\' 4"  (1.93 m)  Wt 227 lb (102.967 kg)  BMI 27.64 kg/m2  Patient's feet did not show any abnormal calluses or ulcerations  He is starting to get claw toes on 34 and 5 of the right foot  Sensation is decreased over the right lower leg from about the area of the fibular head downward  Sensation on the left is decreased but not as significantly  No pain with palpation of the feet today

## 2013-11-29 NOTE — Patient Instructions (Signed)
For balance Use 3 ankle exercises as given  Add some core exercises - 2 key ones on sheet  Ty experimenting with dose of gabapentin to see if it lessens your neuropathy  Start 300 three times a day Try on weekend when not working Usually you get tolerance to side effects  Try less compression on socks and softer shoes  We can build dose steadily if working and I will give you a schedule  Ask Dr Mare Ferrari if symptoms were orthostatic from beta blocker

## 2013-11-30 NOTE — Telephone Encounter (Signed)
Follow up     Patient calling stating he will discuss information when nurse call back.

## 2013-11-30 NOTE — Telephone Encounter (Signed)
Follow up     [place] Patient said he could be here tomorrow at 8:45.

## 2013-12-01 ENCOUNTER — Encounter: Payer: Self-pay | Admitting: Cardiology

## 2013-12-01 ENCOUNTER — Ambulatory Visit (INDEPENDENT_AMBULATORY_CARE_PROVIDER_SITE_OTHER): Payer: Medicare Other | Admitting: Cardiology

## 2013-12-01 VITALS — BP 108/70 | HR 68 | Ht 76.0 in | Wt 227.0 lb

## 2013-12-01 DIAGNOSIS — G629 Polyneuropathy, unspecified: Secondary | ICD-10-CM

## 2013-12-01 DIAGNOSIS — E7800 Pure hypercholesterolemia, unspecified: Secondary | ICD-10-CM | POA: Diagnosis not present

## 2013-12-01 DIAGNOSIS — G609 Hereditary and idiopathic neuropathy, unspecified: Secondary | ICD-10-CM

## 2013-12-01 DIAGNOSIS — R42 Dizziness and giddiness: Secondary | ICD-10-CM | POA: Diagnosis not present

## 2013-12-01 DIAGNOSIS — G4733 Obstructive sleep apnea (adult) (pediatric): Secondary | ICD-10-CM | POA: Diagnosis not present

## 2013-12-01 DIAGNOSIS — I4891 Unspecified atrial fibrillation: Secondary | ICD-10-CM | POA: Diagnosis not present

## 2013-12-01 DIAGNOSIS — I480 Paroxysmal atrial fibrillation: Secondary | ICD-10-CM

## 2013-12-01 MED ORDER — METOPROLOL SUCCINATE ER 25 MG PO TB24
25.0000 mg | ORAL_TABLET | Freq: Every day | ORAL | Status: DC
Start: 2013-12-01 — End: 2014-12-11

## 2013-12-01 MED ORDER — METOPROLOL SUCCINATE ER 25 MG PO TB24: 25.0000 mg | ORAL_TABLET | Freq: Every day | ORAL | Status: DC

## 2013-12-01 NOTE — Progress Notes (Signed)
Wesley [name]. Date of Birth:  Nov 30, 1940 [facility] [address] [place], [place]  [address] [phone]        Fax   [phone]   History of Present Illness: This pleasant 73 year old gentleman is seen for a work in office visit. He has a past history of atrial fibrillation but had successful ablation of his atrial fibrillation on 02/19/10 in [place]. Following his ablation he underwent successful cardioversion back in [place] in August 2011 and since then has remained in normal sinus rhythm. He is no longer on Coumadin. He does take a 325 mg aspirin daily. Patient has a past history of hypercholesterolemia. Since we last saw the patient he has developed symptoms of peripheral neuropathy. He has seen Dr. Jannifer Franklin who suggested that even very moderate alcohol intake over a long period of time could possibly be a contributing factor. For this reason the patient has essentially stopped drinking almost completely. He never had been a heavy drinker. Since last visit he's had no new cardiac symptoms. He feels that his CPAP machine has helped him a great deal. He comes in today as a work in because of some recent episodes of lightheadedness.  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  He did not drink much fluids.  After going back in the house and putting his tools away he felt lightheaded and dizzy and had diaphoresis and felt like he might faint.  He did not actually lose consciousness.  2 days later on Monday while driving he had another similar episode.  He went to see Dr. Virgina Jock on the day later, Tuesday, and at that time he checked out okay from a neurologic standpoint and blood tests all came back normal.  He saw his physician Dr. Eustace Moore feels who follows him for neuropathy and Dr. Oneida Alar was concerned that the patient might be having some orthostatic hypotension.   Current Outpatient Prescriptions  Medication  Sig Dispense Refill  . Alpha-Lipoic Acid 600 MG CAPS Take 600 mg by mouth daily.      . Ascorbic Acid (VITAMIN C) 1000 MG tablet Take 1,000 mg by mouth daily.        Marland Kitchen aspirin 325 MG tablet Take 325 mg by mouth daily.        Marland Kitchen b complex vitamins capsule Take 1 capsule by mouth daily.        . Cyanocobalamin 5000 MCG SUBL Place under the tongue daily.      Marland Kitchen esomeprazole (NEXIUM) 40 MG capsule One daily  90 capsule  3  . fish oil-omega-3 fatty acids 1000 MG capsule Take 1 g by mouth daily.        Marland Kitchen gabapentin (NEURONTIN) 300 MG capsule Take 2 capsules (600 mg total) by mouth at bedtime.  180 capsule  0  . Loperamide-Simethicone 2-125 MG TABS Take by mouth 2 (two) times daily.      . metoprolol succinate (TOPROL-XL) 25 MG 24 hr tablet Take 1 tablet (25 mg total) by mouth daily.  90 tablet  3  . Multiple Vitamin (MULTIVITAMIN) tablet Take 1 tablet by mouth daily.        . OMEGA-3 KRILL OIL 300 MG CAPS Take by mouth daily.      Marland Kitchen pyridOXINE (VITAMIN B-6) 50 MG tablet Take 50 mg by mouth 2 (two) times daily.       No current facility-administered medications for this visit.  No Known Allergies  Patient Active Problem List   Diagnosis Date Noted  . Calf pain 11/22/2012  . 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/22/2012  . Peripheral neuropathy 10/21/2012  . Pain in joint, ankle and foot 09/21/2012  . Disturbance of skin sensation 09/21/2012  . 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 09/21/2012  . Other general symptoms(780.99) 09/21/2012  . Heel pain 06/07/2012  . Plantar fasciitis 06/07/2012  . Mitral valve prolapse 06/03/2011  . Shoulder pain, right 04/03/2011  . Rotator cuff syndrome of right shoulder 04/03/2011  . Paroxysmal atrial fibrillation 01/20/2011  . Hypercholesterolemia 01/20/2011  . OBSTRUCTIVE SLEEP APNEA 10/08/2009    History  Smoking status  . Former Smoker  . Quit date: 07/27/1969  Smokeless tobacco  . Never Used    History  Alcohol Use  . Yes     Comment: 1-2 most days    Family History  Problem Relation Age of Onset  . Prostate cancer Father   . Arthritis Father   . Cancer Father   . Heart failure Mother   . Hypertension Mother     Review of Systems: Constitutional: no fever chills diaphoresis or fatigue or change in weight.  Head and neck: no hearing loss, no epistaxis, no photophobia or visual disturbance. Respiratory: No cough, shortness of breath or wheezing. Cardiovascular: No chest pain peripheral edema, palpitations. Gastrointestinal: No abdominal distention, no abdominal pain, no change in bowel habits hematochezia or melena. Genitourinary: No dysuria, no frequency, no urgency, no nocturia. Musculoskeletal:No arthralgias, no back pain, no gait disturbance or myalgias. Neurological: No dizziness, no headaches, no numbness, no seizures, no syncope, no weakness, no tremors. Hematologic: No lymphadenopathy, no easy bruising. Psychiatric: No confusion, no hallucinations, no sleep disturbance.    Physical Exam: Filed Vitals:   12/01/13 0943  BP: 108/70  Pulse:    the general appearance reveals a well-developed well-nourished gentleman in no distress.The head and neck exam reveals pupils equal and reactive.  Extraocular movements are full.  There is no scleral icterus.  The mouth and pharynx are normal.  The neck is supple.  The carotids reveal no bruits.  The jugular venous pressure is normal.  The  thyroid is not enlarged.  There is no lymphadenopathy.  The chest is clear to percussion and auscultation.  There are no rales or rhonchi.  Expansion of the chest is symmetrical.  The precordium is quiet.  The first heart sound is normal.  The second heart sound is physiologically split.  There is no murmur gallop rub or click.  There is no abnormal lift or heave.  The abdomen is soft and nontender.  The bowel sounds are normal.  The liver and spleen are not enlarged.  There are no abdominal masses.  There are no abdominal  bruits.  Extremities reveal good pedal pulses.  There is no phlebitis or edema.  There is no cyanosis or clubbing.  Strength is normal and symmetrical in all extremities.  There is no lateralizing weakness.  There are no sensory deficits.  The skin is warm and dry.  There is no rash.     Assessment / Plan: 1. mild orthostatic hypotension 2. paroxysmal atrial fibrillation, maintaining normal sinus rhythm after atrial fibrillation ablation procedure. 3. Hypercholesterolemia 4. irritable bowel syndrome  Plan: We have reduced his Toprol to just 25 mg daily.  He will try to increase his salt and water intake.  Recheck here in 4 months.  He will be getting blood work in 2 months when he has  his physical with Dr. Virgina Jock

## 2013-12-01 NOTE — Assessment & Plan Note (Signed)
The patient acknowledges that he probably does not drink enough fluids during the day.  Examination here shows that he has a mild drop in his blood pressure with orthostatic blood pressure checks.  His pressure is 122/70 supine 108/72 sitting and 108/70 standing.  He has been on Toprol 50 mg daily.  We will cut back on his Toprol dose to just 25 mg daily.  We will also encourage him to drink plenty of fluids and be sure that he takes enough dietary salt as well

## 2013-12-01 NOTE — Assessment & Plan Note (Signed)
The patient has not had any recurrence of his atrial fibrillation

## 2013-12-01 NOTE — Patient Instructions (Signed)
DECREASE YOUR TOPROL (METOPROLOL) TO 25 MG DAILY, NEW RX SENT TO [facility]  INCREASE YOUR WATER AND SALT INTAKE   Your physician recommends that you schedule a follow-up appointment in: 4 month ov

## 2013-12-01 NOTE — Assessment & Plan Note (Signed)
The patient is on gabapentin for his peripheral neuropathy.  He feels that the gabapentin has made some improvement.

## 2013-12-06 ENCOUNTER — Telehealth: Payer: Self-pay | Admitting: Cardiology

## 2013-12-06 ENCOUNTER — Other Ambulatory Visit: Payer: Self-pay

## 2013-12-06 MED ORDER — ESOMEPRAZOLE MAGNESIUM 40 MG PO CPDR: DELAYED_RELEASE_CAPSULE | ORAL | Status: DC

## 2013-12-06 NOTE — Telephone Encounter (Signed)
New message   Patient calling Last office notes to be sent to PCP -  Dr. Virgina Jock .

## 2013-12-06 NOTE — Telephone Encounter (Signed)
Advised patient sent last ov to Dr Virgina Jock as requested

## 2014-01-12 ENCOUNTER — Other Ambulatory Visit: Payer: Self-pay | Admitting: *Deleted

## 2014-01-12 ENCOUNTER — Other Ambulatory Visit: Payer: Self-pay | Admitting: Sports Medicine

## 2014-01-12 MED ORDER — GABAPENTIN 300 MG PO CAPS: 300.0000 mg | ORAL_CAPSULE | Freq: Three times a day (TID) | ORAL | Status: DC

## 2014-01-17 ENCOUNTER — Encounter: Payer: Self-pay | Admitting: Cardiology

## 2014-01-17 ENCOUNTER — Other Ambulatory Visit: Payer: Self-pay | Admitting: *Deleted

## 2014-01-17 MED ORDER — GABAPENTIN 300 MG PO CAPS: 300.0000 mg | ORAL_CAPSULE | Freq: Three times a day (TID) | ORAL | Status: DC

## 2014-02-05 DIAGNOSIS — H04129 Dry eye syndrome of unspecified lacrimal gland: Secondary | ICD-10-CM | POA: Diagnosis not present

## 2014-02-05 DIAGNOSIS — H1045 Other chronic allergic conjunctivitis: Secondary | ICD-10-CM | POA: Diagnosis not present

## 2014-02-05 DIAGNOSIS — H2510 Age-related nuclear cataract, unspecified eye: Secondary | ICD-10-CM | POA: Diagnosis not present

## 2014-02-16 ENCOUNTER — Encounter: Payer: Self-pay | Admitting: Cardiology

## 2014-02-16 DIAGNOSIS — E7800 Pure hypercholesterolemia, unspecified: Secondary | ICD-10-CM | POA: Diagnosis not present

## 2014-02-21 DIAGNOSIS — Z0000 Encounter for general adult medical examination without abnormal findings: Secondary | ICD-10-CM | POA: Diagnosis not present

## 2014-02-21 DIAGNOSIS — R197 Diarrhea, unspecified: Secondary | ICD-10-CM | POA: Diagnosis not present

## 2014-02-21 DIAGNOSIS — M459 Ankylosing spondylitis of unspecified sites in spine: Secondary | ICD-10-CM | POA: Diagnosis not present

## 2014-02-21 DIAGNOSIS — I4891 Unspecified atrial fibrillation: Secondary | ICD-10-CM | POA: Diagnosis not present

## 2014-02-21 DIAGNOSIS — Z1212 Encounter for screening for malignant neoplasm of rectum: Secondary | ICD-10-CM | POA: Diagnosis not present

## 2014-02-21 DIAGNOSIS — Z1331 Encounter for screening for depression: Secondary | ICD-10-CM | POA: Diagnosis not present

## 2014-02-21 DIAGNOSIS — G609 Hereditary and idiopathic neuropathy, unspecified: Secondary | ICD-10-CM | POA: Diagnosis not present

## 2014-02-21 DIAGNOSIS — M1990 Unspecified osteoarthritis, unspecified site: Secondary | ICD-10-CM | POA: Diagnosis not present

## 2014-02-21 DIAGNOSIS — E7800 Pure hypercholesterolemia, unspecified: Secondary | ICD-10-CM | POA: Diagnosis not present

## 2014-02-21 DIAGNOSIS — IMO0002 Reserved for concepts with insufficient information to code with codable children: Secondary | ICD-10-CM | POA: Diagnosis not present

## 2014-02-23 ENCOUNTER — Ambulatory Visit (INDEPENDENT_AMBULATORY_CARE_PROVIDER_SITE_OTHER): Payer: TRICARE For Life (TFL) | Admitting: General Surgery

## 2014-04-16 ENCOUNTER — Telehealth: Payer: Self-pay | Admitting: Cardiology

## 2014-04-16 NOTE — Telephone Encounter (Signed)
Patient wanted to know name of Rheumatologist he saw in the 30's. Requested paper chart.

## 2014-04-16 NOTE — Telephone Encounter (Signed)
°  Patient would like to speak with Rip Harbour regarding his medical records. Please call and advise.

## 2014-04-17 DIAGNOSIS — IMO0002 Reserved for concepts with insufficient information to code with codable children: Secondary | ICD-10-CM | POA: Diagnosis not present

## 2014-04-17 DIAGNOSIS — M459 Ankylosing spondylitis of unspecified sites in spine: Secondary | ICD-10-CM | POA: Diagnosis not present

## 2014-04-17 NOTE — Telephone Encounter (Signed)
Left message to call back  

## 2014-04-18 NOTE — Telephone Encounter (Signed)
MD name given to patient. He will discuss further with  Dr. Mare Ferrari tomorrow at [facility]

## 2014-04-19 ENCOUNTER — Encounter: Payer: Self-pay | Admitting: Cardiology

## 2014-04-19 ENCOUNTER — Ambulatory Visit (INDEPENDENT_AMBULATORY_CARE_PROVIDER_SITE_OTHER): Payer: Medicare Other | Admitting: Cardiology

## 2014-04-19 VITALS — BP 120/72 | HR 71 | Ht 76.0 in | Wt 230.8 lb

## 2014-04-19 DIAGNOSIS — I059 Rheumatic mitral valve disease, unspecified: Secondary | ICD-10-CM | POA: Diagnosis not present

## 2014-04-19 DIAGNOSIS — G4733 Obstructive sleep apnea (adult) (pediatric): Secondary | ICD-10-CM | POA: Diagnosis not present

## 2014-04-19 DIAGNOSIS — I341 Nonrheumatic mitral (valve) prolapse: Secondary | ICD-10-CM

## 2014-04-19 DIAGNOSIS — I480 Paroxysmal atrial fibrillation: Secondary | ICD-10-CM

## 2014-04-19 DIAGNOSIS — I4891 Unspecified atrial fibrillation: Secondary | ICD-10-CM

## 2014-04-19 NOTE — Patient Instructions (Signed)
Your physician recommends that you continue on your current medications as directed. Please refer to the Current Medication list given to you today.  Your physician wants you to follow-up in: 6 MONTH OV /EKG You will receive a reminder letter in the mail two months in advance. If you don't receive a letter, please call our office to schedule the follow-up appointment.  

## 2014-04-19 NOTE — Progress Notes (Signed)
Wesley [name]. Date of Birth:  1941-06-18 [facility] [address] [place], [place]  [address] [phone]        Fax   [phone]   History of Present Illness: This pleasant 73 year old gentleman is seen for a scheduled followup office visit. He has a past history of atrial fibrillation but had successful ablation of his atrial fibrillation on 02/19/10 in [place]. Following his ablation he underwent successful cardioversion back in [place] in August 2011 and since then has remained in normal sinus rhythm. He is no longer on Coumadin. He does take a 325 mg aspirin daily. Patient has a past history of hypercholesterolemia. Since we last saw the patient he has developed symptoms of peripheral neuropathy. He has seen Dr. Jannifer Franklin who suggested that even very moderate alcohol intake over a long period of time could possibly be a contributing factor. For this reason the patient has essentially stopped drinking almost completely. He never had been a heavy drinker. Since last visit he's had no new cardiac symptoms. He feels that his CPAP machine has helped him a great deal. We last saw him as a work in office visit in May 2015 after he had experienced some episodes of lightheadedness.  We cut back on his metoprolol and he has had no further episodes of dizziness or lightheadedness. The patient has had some arthritis and saw Dr. Rondel Oh who has placed him on meloxicam for degenerative arthritis. The patient had his annual physical examination with Dr. Virgina Jock several months ago and his labs are reportedly satisfactory.  The patient will get [place] copies. The patient has had some problems with chronic hemorrhoids and is considering surgery.  Current Outpatient Prescriptions  Medication Sig Dispense Refill  . Alpha-Lipoic Acid 600 MG CAPS Take 600 mg by mouth daily.      . Ascorbic Acid (VITAMIN C) 1000 MG tablet Take 1,000 mg by mouth daily.        Marland Kitchen aspirin 325  MG tablet Take 325 mg by mouth daily.        Marland Kitchen b complex vitamins capsule Take 1 capsule by mouth daily.        . Cyanocobalamin 5000 MCG SUBL Place under the tongue daily.      Marland Kitchen esomeprazole (NEXIUM) 40 MG capsule Take One tablet  daily  90 capsule  3  . fish oil-omega-3 fatty acids 1000 MG capsule Take 1 g by mouth daily.        Marland Kitchen gabapentin (NEURONTIN) 300 MG capsule Take 1 capsule (300 mg total) by mouth 3 (three) times daily.  270 capsule  11  . Loperamide-Simethicone 2-125 MG TABS Take by mouth 2 (two) times daily.      . meloxicam (MOBIC) 15 MG tablet Take 15 mg by mouth daily as needed for pain.      . metoprolol succinate (TOPROL-XL) 25 MG 24 hr tablet Take 1 tablet (25 mg total) by mouth daily.  90 tablet  3  . Multiple Vitamin (MULTIVITAMIN) tablet Take 1 tablet by mouth daily.        . OMEGA-3 KRILL OIL 300 MG CAPS Take by mouth daily.      Marland Kitchen pyridOXINE (VITAMIN B-6) 50 MG tablet Take 50 mg by mouth 2 (two) times daily.       No current facility-administered medications for this visit.    No Known Allergies  Patient Active Problem List   Diagnosis Date Noted  . Orthostatic dizziness  12/01/2013  . Calf pain 11/22/2012  . 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/22/2012  . Peripheral neuropathy 10/21/2012  . Pain in joint, ankle and foot 09/21/2012  . Disturbance of skin sensation 09/21/2012  . 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 09/21/2012  . Other general symptoms(780.99) 09/21/2012  . Heel pain 06/07/2012  . Plantar fasciitis 06/07/2012  . Mitral valve prolapse 06/03/2011  . Shoulder pain, right 04/03/2011  . Rotator cuff syndrome of right shoulder 04/03/2011  . Paroxysmal atrial fibrillation 01/20/2011  . Hypercholesterolemia 01/20/2011  . OBSTRUCTIVE SLEEP APNEA 10/08/2009    History  Smoking status  . Former Smoker  . Quit date: 07/27/1969  Smokeless tobacco  . Never Used    History  Alcohol Use  . Yes    Comment: 1-2 most days    Family  History  Problem Relation Age of Onset  . Prostate cancer Father   . Arthritis Father   . Cancer Father   . Heart failure Mother   . Hypertension Mother     Review of Systems: Constitutional: no fever chills diaphoresis or fatigue or change in weight.  Head and neck: no hearing loss, no epistaxis, no photophobia or visual disturbance. Respiratory: No cough, shortness of breath or wheezing. Cardiovascular: No chest pain peripheral edema, palpitations. Gastrointestinal: No abdominal distention, no abdominal pain, no change in bowel habits hematochezia or melena. Genitourinary: No dysuria, no frequency, no urgency, no nocturia. Musculoskeletal:No arthralgias, no back pain, no gait disturbance or myalgias. Neurological: No dizziness, no headaches, no numbness, no seizures, no syncope, no weakness, no tremors. Hematologic: No lymphadenopathy, no easy bruising. Psychiatric: No confusion, no hallucinations, no sleep disturbance.    Physical Exam: Filed Vitals:   04/19/14 0920  BP: 120/72  Pulse: 71   the general appearance reveals a well-developed well-nourished gentleman in no distress.The head and neck exam reveals pupils equal and reactive.  Extraocular movements are full.  There is no scleral icterus.  The mouth and pharynx are normal.  The neck is supple.  The carotids reveal no bruits.  The jugular venous pressure is normal.  The  thyroid is not enlarged.  There is no lymphadenopathy.  The chest is clear to percussion and auscultation.  There are no rales or rhonchi.  Expansion of the chest is symmetrical.  The precordium is quiet.  The first heart sound is normal.  The second heart sound is physiologically split.  There is no murmur gallop rub or click.  There is no abnormal lift or heave.  The abdomen is soft and nontender.  The bowel sounds are normal.  The liver and spleen are not enlarged.  There are no abdominal masses.  There are no abdominal bruits.  Extremities reveal good pedal  pulses.  There is no phlebitis or edema.  There is no cyanosis or clubbing.  Strength is normal and symmetrical in all extremities.  There is no lateralizing weakness.  There are no sensory deficits.  The skin is warm and dry.  There is no rash.     Assessment / Plan: 1. mild orthostatic hypotension, resolved after reducing Toprol to 25 mg daily 2. paroxysmal atrial fibrillation, maintaining normal sinus rhythm after atrial fibrillation ablation procedure. 3. Hypercholesterolemia 4. irritable bowel syndrome 5. degenerative osteoarthritis 6. hemorrhoid  Plan: Continue current therapy.  Recheck in 6 months for office visit and EKG.  I suggested that he call Dr. Alphonsa Overall about the hemorrhoid problem, although there are plenty of good general surgeons in that group to choose  from.

## 2014-04-27 DIAGNOSIS — D485 Neoplasm of uncertain behavior of skin: Secondary | ICD-10-CM | POA: Diagnosis not present

## 2014-04-27 DIAGNOSIS — L570 Actinic keratosis: Secondary | ICD-10-CM | POA: Diagnosis not present

## 2014-05-11 ENCOUNTER — Telehealth: Payer: Self-pay | Admitting: Cardiology

## 2014-05-11 NOTE — Telephone Encounter (Signed)
Advised patient. He will start to monitor and call back with update.

## 2014-05-11 NOTE — Telephone Encounter (Signed)
Spoke with patient and he has had several episodes that feel exactly like before having his Metoprolol reduced to 25 mg daily secondary to drop in blood pressure. Patient states when he had first episode before seeing  Dr. Mare Ferrari went to PCP and had a vertigo work up. Patient does not have blood pressure machine at home, advised he needed to get one. Will forward to  Dr. Mare Ferrari for review

## 2014-05-11 NOTE — Telephone Encounter (Signed)
Agree with advice given. If BP is running low or low normal I would try reducing Toprol to just 12.5 mg daily

## 2014-05-11 NOTE — Telephone Encounter (Signed)
New message      Pt thinks he is having a reaction to his metoprolol.  It is causing him to be dizzy and disoriented.  Please advise

## 2014-06-07 ENCOUNTER — Telehealth: Payer: Self-pay | Admitting: Cardiology

## 2014-06-07 NOTE — Telephone Encounter (Signed)
New message      Talk to Melinda---need to talk about his bp

## 2014-06-07 NOTE — Telephone Encounter (Signed)
Left message to call back at home number and cell number

## 2014-06-08 DIAGNOSIS — L570 Actinic keratosis: Secondary | ICD-10-CM | POA: Diagnosis not present

## 2014-06-08 DIAGNOSIS — D225 Melanocytic nevi of trunk: Secondary | ICD-10-CM | POA: Diagnosis not present

## 2014-06-08 DIAGNOSIS — L905 Scar conditions and fibrosis of skin: Secondary | ICD-10-CM | POA: Diagnosis not present

## 2014-06-08 DIAGNOSIS — L821 Other seborrheic keratosis: Secondary | ICD-10-CM | POA: Diagnosis not present

## 2014-06-08 NOTE — Telephone Encounter (Signed)
Agree with above.  He should let us know if he has any more episodes of being out of rhythm.

## 2014-06-08 NOTE — Telephone Encounter (Signed)
Follow up ° ° ° ° ° ° °Pt returning Melinda's call °

## 2014-06-08 NOTE — Telephone Encounter (Signed)
Patient had episode of feeling dizzy yesterday while working out. Did not stop working out but did cut his work out short When he got home blood pressure 104/98 hr in left arm, right arm 121/66 and 117/68 Today he had some dizziness but not like yesterday Patient sips on his coffee during workout but does not drink water Discussed with  Dr. Mare Ferrari and will have him increase his water before workout  Try to get blood pressure during dizzy spell Call back if anymore problems Advised patient  When called he did want for  Dr. Mare Ferrari to be aware he had short episode of being out of rhythm several weeks ago Will forward to  Dr. Mare Ferrari for review and advised patient to call back if this should happen again

## 2014-06-27 ENCOUNTER — Telehealth: Payer: Self-pay | Admitting: Cardiology

## 2014-06-27 NOTE — Telephone Encounter (Signed)
Patient doing better with blood pressure and dizziness. Patient does not want appointment at this time just wanted to be seen sooner than 6 month follow up in March. Scheduled ov for patient in January.

## 2014-06-27 NOTE — Telephone Encounter (Signed)
New Message  Pt called states that he is having low BP's and dizziness. Believes he may need a sooner appt than a 6 mo follow up//Declines PA or NP. Please call back to discuss/

## 2014-06-28 ENCOUNTER — Ambulatory Visit: Payer: Medicare Other | Admitting: Cardiology

## 2014-07-17 ENCOUNTER — Telehealth: Payer: Self-pay | Admitting: Cardiology

## 2014-07-17 DIAGNOSIS — M450 Ankylosing spondylitis of multiple sites in spine: Secondary | ICD-10-CM | POA: Diagnosis not present

## 2014-07-17 DIAGNOSIS — M150 Primary generalized (osteo)arthritis: Secondary | ICD-10-CM | POA: Diagnosis not present

## 2014-07-17 DIAGNOSIS — M5136 Other intervertebral disc degeneration, lumbar region: Secondary | ICD-10-CM | POA: Diagnosis not present

## 2014-07-17 NOTE — Telephone Encounter (Signed)
New Prob    Pt has some questions regarding his upcoming appointment in January. Please call.

## 2014-07-17 NOTE — Telephone Encounter (Signed)
Patient concerned about going in and of rhythm more frequently than he has been Moved up appointment to Monday 12/28 next day  Dr. Mare Ferrari in the office

## 2014-07-23 ENCOUNTER — Encounter: Payer: Self-pay | Admitting: Cardiology

## 2014-07-23 ENCOUNTER — Ambulatory Visit (INDEPENDENT_AMBULATORY_CARE_PROVIDER_SITE_OTHER): Payer: Medicare Other | Admitting: Cardiology

## 2014-07-23 VITALS — BP 126/78 | HR 66 | Ht 76.0 in | Wt 237.0 lb

## 2014-07-23 DIAGNOSIS — E7800 Pure hypercholesterolemia, unspecified: Secondary | ICD-10-CM

## 2014-07-23 DIAGNOSIS — I480 Paroxysmal atrial fibrillation: Secondary | ICD-10-CM | POA: Diagnosis not present

## 2014-07-23 DIAGNOSIS — G629 Polyneuropathy, unspecified: Secondary | ICD-10-CM

## 2014-07-23 NOTE — Patient Instructions (Signed)
Your physician has recommended that you wear an event monitor. Event monitors are medical devices that record the heart's electrical activity. Doctors most often [place] these monitors to diagnose arrhythmias. Arrhythmias are problems with the speed or rhythm of the heartbeat. The monitor is a small, portable device. You can wear one while you do your normal daily activities. This is usually used to diagnose what is causing palpitations/syncope (passing out). 30 day  Your physician recommends that you continue on your current medications as directed. Please refer to the Current Medication list given to you today.  Your physician wants you to follow-up in: 6 month ov/ekg  You will receive a reminder letter in the mail two months in advance. If you don't receive a letter, please call our office to schedule the follow-up appointment.

## 2014-07-23 NOTE — Progress Notes (Signed)
Wesley [name] [place]. Date of Birth:  [birth year] [facility] [address] [place], [place]  [address] [phone]        Fax   [phone]   History of Present Illness: This pleasant 73 year old gentleman is seen for a work in follow-up office visit. He has a past history of atrial fibrillation but had successful ablation of his atrial fibrillation on 02/19/10 in [place]. Following his ablation he underwent successful cardioversion back in [place] in August 2011 and since then has remained in normal sinus rhythm. He is no longer on Coumadin. He does take a 325 mg aspirin daily. Patient has a past history of hypercholesterolemia. Since we last saw the patient he has developed symptoms of peripheral neuropathy. He has seen Dr. Jannifer Franklin who suggested that even very moderate alcohol intake over a long period of time could possibly be a contributing factor. For this reason the patient has essentially stopped drinking almost completely. He never had been a heavy drinker. Since last visit he's had no new cardiac symptoms. He feels that his CPAP machine has helped him a great deal. We last saw him as a work in office visit in May 2015 after he had experienced some episodes of lightheadedness.  We cut back on his metoprolol and he has had no further episodes of dizziness or lightheadedness. The patient has had some arthritis and saw Dr. Rondel Oh who has placed him on meloxicam for degenerative arthritis. The patient had his annual physical examination with Dr. Virgina Jock several months ago and his labs are reportedly satisfactory.  The patient will get [place] copies. Since we last saw him he has had 2 episodes of brief irregular heartbeat.  One occurred in mid October when he was up at the [place].  It lasted about 2 hours.  A second episode occurred in early December while he was playing tennis.  He was able to continue playing tennis and the arrhythmia stopped as  he went to bed.  His EKG today shows normal sinus rhythm  Current Outpatient Prescriptions  Medication Sig Dispense Refill  . Alpha-Lipoic Acid 600 MG CAPS Take 600 mg by mouth daily.    . Ascorbic Acid (VITAMIN C) 1000 MG tablet Take 1,000 mg by mouth daily.      Marland Kitchen aspirin 325 MG tablet Take 325 mg by mouth daily.      Marland Kitchen b complex vitamins capsule Take 1 capsule by mouth daily.      . Cyanocobalamin 5000 MCG SUBL Place under the tongue daily.    Marland Kitchen esomeprazole (NEXIUM) 40 MG capsule Take One tablet  daily 90 capsule 3  . fish oil-omega-3 fatty acids 1000 MG capsule Take 1 g by mouth daily.      Marland Kitchen gabapentin (NEURONTIN) 300 MG capsule Take 1 capsule (300 mg total) by mouth 3 (three) times daily. 270 capsule 11  . Loperamide-Simethicone 2-125 MG TABS Take by mouth 2 (two) times daily.    . meloxicam (MOBIC) 15 MG tablet Take 15 mg by mouth daily as needed for pain.    . metoprolol succinate (TOPROL-XL) 25 MG 24 hr tablet Take 1 tablet (25 mg total) by mouth daily. 90 tablet 3  . Multiple Vitamin (MULTIVITAMIN) tablet Take 1 tablet by mouth daily.      Marland Kitchen pyridOXINE (VITAMIN B-6) 50 MG tablet Take 50 mg by mouth 2 (two) times daily.     No current facility-administered medications for this visit.  No Known Allergies  Patient Active Problem List   Diagnosis Date Noted  . Orthostatic dizziness 12/01/2013  . Calf pain 11/22/2012  . 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/22/2012  . Peripheral neuropathy 10/21/2012  . Pain in joint, ankle and foot 09/21/2012  . Disturbance of skin sensation 09/21/2012  . 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 09/21/2012  . Other general symptoms(780.99) 09/21/2012  . Heel pain 06/07/2012  . Plantar fasciitis 06/07/2012  . Mitral valve prolapse 06/03/2011  . Shoulder pain, right 04/03/2011  . Rotator cuff syndrome of right shoulder 04/03/2011  . Paroxysmal atrial fibrillation 01/20/2011  . Hypercholesterolemia 01/20/2011  . OBSTRUCTIVE SLEEP APNEA  10/08/2009    History  Smoking status  . Former Smoker  . Quit date: 07/27/1969  Smokeless tobacco  . Never Used    History  Alcohol Use  . Yes    Comment: 1-2 most days    Family History  Problem Relation Age of Onset  . Prostate cancer Father   . Arthritis Father   . Cancer Father   . Heart failure Mother   . Hypertension Mother     Review of Systems: Constitutional: no fever chills diaphoresis or fatigue or change in weight.  Head and neck: no hearing loss, no epistaxis, no photophobia or visual disturbance. Respiratory: No cough, shortness of breath or wheezing. Cardiovascular: No chest pain peripheral edema, palpitations. Gastrointestinal: No abdominal distention, no abdominal pain, no change in bowel habits hematochezia or melena. Genitourinary: No dysuria, no frequency, no urgency, no nocturia. Musculoskeletal:No arthralgias, no back pain, no gait disturbance or myalgias. Neurological: No dizziness, no headaches, no numbness, no seizures, no syncope, no weakness, no tremors. Hematologic: No lymphadenopathy, no easy bruising. Psychiatric: No confusion, no hallucinations, no sleep disturbance.    Physical Exam: Filed Vitals:   07/23/14 1431  BP: 126/78  Pulse: 66   the general appearance reveals a well-developed well-nourished gentleman in no distress.The head and neck exam reveals pupils equal and reactive.  Extraocular movements are full.  There is no scleral icterus.  The mouth and pharynx are normal.  The neck is supple.  The carotids reveal no bruits.  The jugular venous pressure is normal.  The  thyroid is not enlarged.  There is no lymphadenopathy.  The chest is clear to percussion and auscultation.  There are no rales or rhonchi.  Expansion of the chest is symmetrical.  The precordium is quiet.  The first heart sound is normal.  The second heart sound is physiologically split.  There is no murmur gallop rub or click.  There is no abnormal lift or heave.  The  abdomen is soft and nontender.  The bowel sounds are normal.  The liver and spleen are not enlarged.  There are no abdominal masses.  There are no abdominal bruits.  Extremities reveal good pedal pulses.  There is no phlebitis or edema.  There is no cyanosis or clubbing.  Strength is normal and symmetrical in all extremities.  There is no lateralizing weakness.  There are no sensory deficits.  The skin is warm and dry.  There is no rash.  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 mild orthostatic hypotension, resolved after reducing Toprol to 25 mg daily 2. paroxysmal atrial fibrillation, maintaining normal sinus rhythm after atrial fibrillation ablation procedure.  Recent palpitations of uncertain etiology.  We will arrange for a 30 day event monitor.  If he is having atrial fibrillation, he will need on full anticoagulation 3. Hypercholesterolemia 4. irritable bowel syndrome 5. degenerative osteoarthritis 6. hemorrhoid  Plan: Continue same medication.  Recheck in 6 months for office visit and EKG

## 2014-07-25 ENCOUNTER — Encounter: Payer: Self-pay | Admitting: *Deleted

## 2014-07-25 ENCOUNTER — Encounter (INDEPENDENT_AMBULATORY_CARE_PROVIDER_SITE_OTHER): Payer: Medicare Other

## 2014-07-25 DIAGNOSIS — I480 Paroxysmal atrial fibrillation: Secondary | ICD-10-CM

## 2014-07-25 NOTE — Progress Notes (Signed)
Patient ID: Wesley Chris., male   DOB: [birth year], 73 y.o.   MRN: [id number] [employer] 30 day cardiac event monitor applied to patient.

## 2014-08-09 ENCOUNTER — Ambulatory Visit: Payer: Medicare Other | Admitting: Cardiology

## 2014-08-21 DIAGNOSIS — Z8042 Family history of malignant neoplasm of prostate: Secondary | ICD-10-CM | POA: Diagnosis not present

## 2014-08-21 DIAGNOSIS — N401 Enlarged prostate with lower urinary tract symptoms: Secondary | ICD-10-CM | POA: Diagnosis not present

## 2014-08-29 ENCOUNTER — Telehealth: Payer: Self-pay | Admitting: Cardiology

## 2014-08-29 DIAGNOSIS — N401 Enlarged prostate with lower urinary tract symptoms: Secondary | ICD-10-CM | POA: Diagnosis not present

## 2014-08-29 DIAGNOSIS — N529 Male erectile dysfunction, unspecified: Secondary | ICD-10-CM | POA: Diagnosis not present

## 2014-08-29 DIAGNOSIS — R3916 Straining to void: Secondary | ICD-10-CM | POA: Diagnosis not present

## 2014-08-29 NOTE — Telephone Encounter (Signed)
New message     Want monitor results 

## 2014-08-29 NOTE — Telephone Encounter (Signed)
Advised patient   Event monitor reviewed by  Dr. Mare Ferrari   Interpretation: No Atrial Fibrillation noted

## 2014-09-08 ENCOUNTER — Telehealth: Payer: Self-pay | Admitting: Physician Assistant

## 2014-09-08 ENCOUNTER — Encounter: Payer: Self-pay | Admitting: Physician Assistant

## 2014-09-08 ENCOUNTER — Ambulatory Visit (INDEPENDENT_AMBULATORY_CARE_PROVIDER_SITE_OTHER): Payer: Medicare Other | Admitting: Family Medicine

## 2014-09-08 VITALS — BP 108/70 | HR 75 | Temp 97.7°F | Resp 16 | Ht 76.5 in | Wt 230.4 lb

## 2014-09-08 DIAGNOSIS — I499 Cardiac arrhythmia, unspecified: Secondary | ICD-10-CM

## 2014-09-08 NOTE — Telephone Encounter (Signed)
Wrong patient file. Duplicate MRN. Dayna Brunner PA-C

## 2014-09-08 NOTE — Patient Instructions (Signed)
At this time your cardiac rhythm is normal.  I spoke with Ms. Mecham and for now let's continue your current medications You should receive a call from your cardiology office on Monday to schedule a follow-up; call them if you do not! If any recurrence in the meantime Dayna would like you to contact her

## 2014-09-08 NOTE — Progress Notes (Signed)
[facility] [address], [place] [address] [phone]  Date:  09/08/2014   Name:  Wesley [name].   DOB:  [birth year]   MRN:  [id number]  PCP:  Precious Reel, MD    Chief Complaint: Irregular Heart Beat   History of Present Illness:  Wesley [name]. is a 74 y.o. very pleasant male patient who presents with the following:  Here today to follow-up a cardiac concern. He has a history of atrial fib and had an ablation done 01/2010 He had called into his cardiology clinic this am with a concern; they suggested that he come and get an EKG.   He noted onset of palpations this am around 7am- this has happened a couple of times over the last 4 months. He had a holter monitor in December that failed to show any problem. He no longer feels the palpitations.  He is not quite sure when this stopped, but he knows that it has stopped now.     He denies any CP whatsoever during this episode.   He is otherwise feeling ok, no syncope.    Patient Active Problem List   Diagnosis Date Noted  . Orthostatic dizziness 12/01/2013  . Calf pain 11/22/2012  . 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/22/2012  . Peripheral neuropathy 10/21/2012  . Pain in joint, ankle and foot 09/21/2012  . Disturbance of skin sensation 09/21/2012  . 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 09/21/2012  . Other general symptoms(780.99) 09/21/2012  . Heel pain 06/07/2012  . Plantar fasciitis 06/07/2012  . Mitral valve prolapse 06/03/2011  . Shoulder pain, right 04/03/2011  . Rotator cuff syndrome of right shoulder 04/03/2011  . Paroxysmal atrial fibrillation 01/20/2011  . Hypercholesterolemia 01/20/2011  . OBSTRUCTIVE SLEEP APNEA 10/08/2009    Past Medical History  Diagnosis Date  . OSA (obstructive sleep apnea)     moderate OSA NPSG 10/24/09 - AHO 21.8/hr  . Ankylosing spondylitis   . A-fib     cardioversion x2  . MVP (mitral valve prolapse)     WITH A MIDSYSTOLIC CLICK  . Irregular  heart beat   . Hemorrhoids   . Peripheral neuropathy   . GERD (gastroesophageal reflux disease)   . Vitamin B12 deficiency   . Atrial flutter     Past Surgical History  Procedure Laterality Date  . Inguinal hernia repair    . Venous ligation      for varices left lower leg  . Colonoscopy    . Ablation      History  Substance Use Topics  . Smoking status: Former Smoker    Quit date: 07/27/1969  . Smokeless tobacco: Never Used  . Alcohol Use: Yes     Comment: 1-2 most days    Family History  Problem Relation Age of Onset  . Prostate cancer Father   . Arthritis Father   . Cancer Father   . Heart failure Mother   . Hypertension Mother     No Known Allergies  Medication list has been reviewed and updated.  Current Outpatient Prescriptions on File Prior to Visit  Medication Sig Dispense Refill  . Alpha-Lipoic Acid 600 MG CAPS Take 600 mg by mouth daily.    . Ascorbic Acid (VITAMIN C) 1000 MG tablet Take 1,000 mg by mouth daily.      Marland Kitchen aspirin 325 MG tablet Take 325 mg by mouth daily.      Marland Kitchen b complex vitamins capsule Take 1 capsule by  mouth daily.      . Cyanocobalamin 5000 MCG SUBL Place under the tongue daily.    Marland Kitchen esomeprazole (NEXIUM) 40 MG capsule Take One tablet  daily 90 capsule 3  . fish oil-omega-3 fatty acids 1000 MG capsule Take 1 g by mouth daily.      Marland Kitchen gabapentin (NEURONTIN) 300 MG capsule Take 1 capsule (300 mg total) by mouth 3 (three) times daily. 270 capsule 11  . Loperamide-Simethicone 2-125 MG TABS Take by mouth 2 (two) times daily.    . meloxicam (MOBIC) 15 MG tablet Take 15 mg by mouth daily as needed for pain.    . metoprolol succinate (TOPROL-XL) 25 MG 24 hr tablet Take 1 tablet (25 mg total) by mouth daily. 90 tablet 3  . Multiple Vitamin (MULTIVITAMIN) tablet Take 1 tablet by mouth daily.      Marland Kitchen pyridOXINE (VITAMIN B-6) 50 MG tablet Take 50 mg by mouth 2 (two) times daily.     No current facility-administered medications on file prior to  visit.    Review of Systems:  As per HPI- otherwise negative.   Physical Examination: Filed Vitals:   09/08/14 1438  BP: 108/70  Pulse: 75  Temp: 97.7 F (36.5 C)  Resp: 16   Filed Vitals:   09/08/14 1438  Height: 6' 4.5" (1.943 m)  Weight: 230 lb 6 oz (104.497 kg)   Body mass index is 27.68 kg/(m^2). Ideal Body Weight: Weight in (lb) to have BMI = 25: 207.7  GEN: WDWN, NAD, Non-toxic, A & O x 3, looks well, tall build HEENT: Atraumatic, Normocephalic. Neck supple. No masses, No LAD. Ears and Nose: No external deformity. CV: RRR, No M/G/R. No JVD. No thrill. No extra heart sounds. PULM: CTA B, no wheezes, crackles, rhonchi. No retractions. No resp. distress. No accessory muscle use. EXTR: No c/c/e NEURO Normal gait.  PSYCH: Normally interactive. Conversant. Not depressed or anxious appearing.  Calm demeanor.   EKG: NSR with slight block.  Called and discussed with Melina Copa, PA-C on call for his cardiologist.  At this time we will not make any changes. They plan for close follow-up  Assessment and Plan: Irregular heart beat - Plan: EKG 12-Lead  Palpitations which have since resolved. EKG is reassuring.  He will take it easy. Avoid caffeine.  He will follow-up with cardiology next week, will seek care sooner if any other concerns   Signed Lamar Blinks, MD

## 2014-09-08 NOTE — Telephone Encounter (Signed)
Patient called answering service with palpitations. H/o atrial fibrillation in s/p ablation in 2011. He states a week post-ablation, he developed atrial flutter with subsequent DCCV. He has had fleeting palpitations since that time but nothing sustained enough to be captured. He wore an event monitor in December which did not show afib but he also did not have any recurrent palps while wearing it.   Today he developed recurrent irregular palpitations with a HR around 110. Says he typically runs 60-70s so he noticed the definite change. He says he was never significantly symptomatic with his afib but was able to feel when he went into atrial flutter, so he wonders if it is atrial flutter that he is feeling again. He does not feel acutely bad but feels about 75% of where he normally feels. SBP 140s/70s by home monitor.  I d/w Dr. Acie Fredrickson. Ideally we would like to be able to capture what this rhythm represents. He doesn't seem sick enough to warrant ER visit or admission over the phone, thus we recommended he proceed to urgent care for evaluation/EKG. I have given him my name and pager number to have them call me once they have seen him so we can all come together to decide the next plan of action. Patient agrees with plan and will head to urgent care. CHADSVASC is 1. Per Dr. Sherryl Barters last note, "If he is having atrial fibrillation, he will need on full anticoagulation." Await above workup. Dayna Busick PA-C

## 2014-09-08 NOTE — Telephone Encounter (Signed)
Pt seen in urgent care, palpitations had resolved by then - EKG NSR with RBBB, LAFB. Would not push up BB further right now. I tried to call the patient back to follow-up to see how he is feeling and discuss next steps but got his voicemail. I told him I would call him back tomorrow on the voicemail. Wesley [name] Salim PA-C

## 2014-09-09 ENCOUNTER — Telehealth: Payer: Self-pay | Admitting: Physician Assistant

## 2014-09-09 NOTE — Telephone Encounter (Signed)
See phone note yesterday. I called Wesley [name] back today to follow-up post-urgent care visit. He's feeling well. Total duration of symptoms yesterday 6-8 hours. I discussed the possibility of starting anticoagulation empirically but he would like to discuss with Wesley [name] first - he is wary of being on blood thinners at all. I agree this is reasonable given that we don't really know what his rhythm was yesterday. He also says he plans to call down to his team in [place] to discuss whether or not he would need another procedure at some point (h/o ablation). I wasn't sure if he was aware that we had a team here locally, so I offered him followup in the AF clinic with Wesley [name] and his team. I told him we are in full support either direction but just to keep [place] updated of what their recommendations are. He will let us know what he decides. We discussed the [employer] smartphone telemetry app and I think he'd be a great candidate for this. He may look into it. I told him I would forward this message to Dr. B for his input. [name] PA-C

## 2014-09-10 NOTE — Telephone Encounter (Signed)
Scheduled follow up for this week with  Dr. Mare Ferrari, patient aware

## 2014-09-10 NOTE — Telephone Encounter (Signed)
Perhaps we could see him for an office visit some day this week

## 2014-09-13 ENCOUNTER — Encounter: Payer: Self-pay | Admitting: Cardiology

## 2014-09-13 ENCOUNTER — Ambulatory Visit (INDEPENDENT_AMBULATORY_CARE_PROVIDER_SITE_OTHER): Payer: Medicare Other | Admitting: Cardiology

## 2014-09-13 VITALS — BP 118/56 | HR 82 | Ht 76.0 in | Wt 231.8 lb

## 2014-09-13 DIAGNOSIS — I341 Nonrheumatic mitral (valve) prolapse: Secondary | ICD-10-CM

## 2014-09-13 DIAGNOSIS — E7800 Pure hypercholesterolemia, unspecified: Secondary | ICD-10-CM

## 2014-09-13 DIAGNOSIS — I480 Paroxysmal atrial fibrillation: Secondary | ICD-10-CM | POA: Diagnosis not present

## 2014-09-13 NOTE — Progress Notes (Signed)
Cardiology Office Note   Date:  09/13/2014   ID:  Wesley [name]., DOB [birth year], MRN [id number]  PCP:  Precious Reel, MD  Cardiologist:   Darlin Coco, MD   No chief complaint on file.     History of Present Illness: Wesley [name]. is a 74 y.o. male who presents for a work in office visit.  : This pleasant 74 year old gentleman is seen for a work in follow-up office visit. He has a past history of atrial fibrillation but had successful ablation of his atrial fibrillation on 02/19/10 in [place]. Following his ablation he underwent successful cardioversion back in [place] in August 2011 and since then has remained in normal sinus rhythm. He is no longer on Coumadin. He does take a 325 mg aspirin daily. Patient has a past history of hypercholesterolemia. Since we last saw the patient he has developed symptoms of peripheral neuropathy. He has seen Dr. Jannifer Franklin who suggested that even very moderate alcohol intake over a long period of time could possibly be a contributing factor. For this reason the patient has essentially stopped drinking almost completely. He never had been a heavy drinker. Since last visit he's had no new cardiac symptoms. He feels that his CPAP machine has helped him a great deal. We last saw him as a work in office visit in May 2015 after he had experienced some episodes of lightheadedness. We cut back on his metoprolol and he has had no further episodes of dizziness or lightheadedness. The patient has had some arthritis and saw Dr. Rondel Oh who has placed him on meloxicam for degenerative arthritis. The patient had his annual physical examination with Dr. Virgina Jock several months ago  Since we last saw him he has had 2 episodes of brief irregular heartbeat. One occurred in mid October when he was up at the [place]. It lasted about 2 hours. A second episode occurred in early December while he was playing tennis. He was able to  continue playing tennis and the arrhythmia stopped as he went to bed. Last weekend he thought that he might have gone back into atrial fibrillation or flutter.  He waited a while and then his wife encouraged him to go to urgent care.  He was checked there and his EKG showed normal sinus rhythm.  He has not been aware of any further episodes.  He talked to our on-call PA about obtaining an [employer] app for his i phone and he has ordered 1  Past Medical History  Diagnosis Date  . OSA (obstructive sleep apnea)     moderate OSA NPSG 10/24/09 - AHO 21.8/hr  . Ankylosing spondylitis   . A-fib     cardioversion x2  . MVP (mitral valve prolapse)     WITH A MIDSYSTOLIC CLICK  . Irregular heart beat   . Hemorrhoids   . Peripheral neuropathy   . GERD (gastroesophageal reflux disease)   . Vitamin B12 deficiency   . Atrial flutter     Past Surgical History  Procedure Laterality Date  . Inguinal hernia repair    . Venous ligation      for varices left lower leg  . Colonoscopy    . Ablation       Current Outpatient Prescriptions  Medication Sig Dispense Refill  . Alpha-Lipoic Acid 600 MG CAPS Take 600 mg by mouth daily.    . Ascorbic Acid (VITAMIN C) 1000 MG tablet Take 1,000 mg by mouth daily.      Marland Kitchen  aspirin 325 MG tablet Take 325 mg by mouth daily.      Marland Kitchen b complex vitamins capsule Take 1 capsule by mouth daily.      . Cyanocobalamin 5000 MCG SUBL Place under the tongue daily.    Marland Kitchen esomeprazole (NEXIUM) 40 MG capsule Take One tablet  daily 90 capsule 3  . fish oil-omega-3 fatty acids 1000 MG capsule Take 1 g by mouth daily.      Marland Kitchen gabapentin (NEURONTIN) 300 MG capsule Take 1 capsule (300 mg total) by mouth 3 (three) times daily. 270 capsule 11  . Loperamide-Simethicone 2-125 MG TABS Take by mouth 2 (two) times daily.    . meloxicam (MOBIC) 15 MG tablet Take 15 mg by mouth daily as needed for pain.    . metoprolol succinate (TOPROL-XL) 25 MG 24 hr tablet Take 1 tablet (25 mg total) by  mouth daily. 90 tablet 3  . Multiple Vitamin (MULTIVITAMIN) tablet Take 1 tablet by mouth daily.      Marland Kitchen pyridOXINE (VITAMIN B-6) 50 MG tablet Take 50 mg by mouth 2 (two) times daily.     No current facility-administered medications for this visit.    Allergies:   Review of patient's allergies indicates no known allergies.    Social History:  The patient  reports that he quit smoking about 45 years ago. He has never used smokeless tobacco. He reports that he drinks alcohol.   Family History:  The patient's family history includes Arthritis in his father; Cancer in his father; Heart failure in his mother; Hypertension in his mother; Prostate cancer in his father.    ROS:  Please see the history of present illness.   Otherwise, review of systems are positive for none.   All other systems are reviewed and negative.    PHYSICAL EXAM: VS:  BP 118/56 mmHg  Pulse 82  Ht 6\' 4"  (1.93 m)  Wt 231 lb 12.8 oz (105.144 kg)  BMI 28.23 kg/m2 , BMI Body mass index is 28.23 kg/(m^2). GEN: Well nourished, well developed, in no acute distress HEENT: normal Neck: no JVD, carotid bruits, or masses Cardiac: RRR; no murmurs, rubs, or gallops,no edema .  There is a midsystolic click. Respiratory:  clear to auscultation bilaterally, normal work of breathing GI: soft, nontender, nondistended, + BS MS: no deformity or atrophy Skin: warm and dry, no rash Neuro:  Strength and sensation are intact Psych: euthymic mood, full affect   EKG:  EKG is not ordered today.    Recent Labs: No results found for requested labs within last 365 days.    Lipid Panel    Component Value Date/Time   CHOL 209* 06/20/2013 0856   TRIG 73.0 06/20/2013 0856   HDL 57.10 06/20/2013 0856   CHOLHDL 4 06/20/2013 0856   VLDL 14.6 06/20/2013 0856   LDLCALC 125* 10/19/2012 0836   LDLDIRECT 144.2 06/20/2013 0856      Wt Readings from Last 3 Encounters:  09/13/14 231 lb 12.8 oz (105.144 kg)  09/08/14 230 lb 6 oz (104.497  kg)  07/23/14 237 lb (107.502 kg)         ASSESSMENT AND PLAN:  1. mild orthostatic hypotension, resolved after reducing Toprol to 25 mg daily 2. paroxysmal atrial fibrillation, maintaining normal sinus rhythm after atrial fibrillation ablation procedure. Recent palpitations of uncertain etiology.  He had a recent 30 day monitor which did not show any atrial fibrillation. 3. Hypercholesterolemia 4. irritable bowel syndrome 5. degenerative osteoarthritis.  He is having more  problems with his shoulder and would like to see orthopedic specialist.  I gave him the name of Dr. Netta Cedars   Current medicines are reviewed at length with the patient today.  The patient does not have concerns regarding medicines.  The following changes have been made:  no change  Labs/ tests ordered today include: None  No orders of the defined types were placed in this encounter.     Disposition:   FU with Dr. Mare Ferrari in 3 months   Signed, Darlin Coco, MD  09/13/2014 2:04 PM    [facility] [address], [place], [place]  [address] Phone: [phone]; Fax: [phone]

## 2014-09-13 NOTE — Patient Instructions (Addendum)
Your physician recommends that you continue on your current medications as directed. Please refer to the Current Medication list given to you today.  FOLLOW UP IN June

## 2014-09-19 DIAGNOSIS — M25511 Pain in right shoulder: Secondary | ICD-10-CM | POA: Diagnosis not present

## 2014-10-08 ENCOUNTER — Telehealth: Payer: Self-pay | Admitting: Cardiology

## 2014-10-08 NOTE — Telephone Encounter (Signed)
New Msg        Pt states he needs to get some information about a monitoring device to Dr. Mare Ferrari.   Please call back.

## 2014-10-08 NOTE — Telephone Encounter (Signed)
Calling stating he has an APP on his phone to record his EKG and HR.  Wanted to get Dr. Sherryl Barters Email address to send them to him.  States one showed AF and the others read as NSR.  Advised Dr. Mare Ferrari not in office today but will forward message to him and Rip Harbour Pratt,LPN. Advised would have Rip Harbour call him to give him Dr. Sherryl Barters email address. He verbalizes understanding.

## 2014-10-09 ENCOUNTER — Telehealth: Payer: Self-pay | Admitting: *Deleted

## 2014-10-09 MED ORDER — APIXABAN 5 MG PO TABS
5.0000 mg | ORAL_TABLET | Freq: Two times a day (BID) | ORAL | Status: DC
Start: 2014-10-09 — End: 2014-10-09

## 2014-10-09 MED ORDER — APIXABAN 5 MG PO TABS: 5.0000 mg | ORAL_TABLET | Freq: Two times a day (BID) | ORAL | Status: DC

## 2014-10-09 NOTE — Telephone Encounter (Signed)
Would use [email or web address]

## 2014-10-09 NOTE — Telephone Encounter (Signed)
Advised patient

## 2014-10-09 NOTE — Telephone Encounter (Signed)
Dr. Mare Ferrari received reading from patient regarding heart rate readings Patient has App [employer] 2 of the 4 readings did show AFib and heart rate in the 120's Dr. Mare Ferrari spoke with patient and will Rx Eliquis 5 mg twice a day and to stop Aspirin I spoke with patient and he had picked up Rx. Did advise him to only use Mobic sparingly per  Dr. Mare Ferrari, verbalized understanding

## 2014-10-12 ENCOUNTER — Telehealth: Payer: Self-pay | Admitting: Cardiology

## 2014-10-12 NOTE — Telephone Encounter (Signed)
New Message   Patient has a question about the medication. Please give patient a call back.

## 2014-10-12 NOTE — Telephone Encounter (Signed)
Spoke with patient and he is having a lot of discomfort with Mobic daily Patient has not tried Tylenol, will try over the weekend and if no relief call back Monday Discussed with  Dr. Mare Ferrari and will be ok to go back on Mobic if he needs to

## 2014-10-17 ENCOUNTER — Ambulatory Visit: Payer: Medicare Other | Admitting: Cardiology

## 2014-11-16 DIAGNOSIS — M5136 Other intervertebral disc degeneration, lumbar region: Secondary | ICD-10-CM | POA: Diagnosis not present

## 2014-11-16 DIAGNOSIS — M450 Ankylosing spondylitis of multiple sites in spine: Secondary | ICD-10-CM | POA: Diagnosis not present

## 2014-11-16 DIAGNOSIS — M150 Primary generalized (osteo)arthritis: Secondary | ICD-10-CM | POA: Diagnosis not present

## 2014-12-04 DIAGNOSIS — M25432 Effusion, left wrist: Secondary | ICD-10-CM | POA: Diagnosis not present

## 2014-12-04 DIAGNOSIS — M25532 Pain in left wrist: Secondary | ICD-10-CM | POA: Diagnosis not present

## 2014-12-04 DIAGNOSIS — Z6829 Body mass index (BMI) 29.0-29.9, adult: Secondary | ICD-10-CM | POA: Diagnosis not present

## 2014-12-04 DIAGNOSIS — L03114 Cellulitis of left upper limb: Secondary | ICD-10-CM | POA: Diagnosis not present

## 2014-12-11 ENCOUNTER — Other Ambulatory Visit: Payer: Self-pay | Admitting: Cardiology

## 2014-12-25 ENCOUNTER — Other Ambulatory Visit: Payer: Self-pay | Admitting: *Deleted

## 2014-12-25 MED ORDER — GABAPENTIN 300 MG PO CAPS: 300.0000 mg | ORAL_CAPSULE | Freq: Three times a day (TID) | ORAL | Status: DC

## 2015-01-08 DIAGNOSIS — I4819 Other persistent atrial fibrillation: Secondary | ICD-10-CM | POA: Insufficient documentation

## 2015-01-08 DIAGNOSIS — I4891 Unspecified atrial fibrillation: Secondary | ICD-10-CM | POA: Diagnosis not present

## 2015-01-08 DIAGNOSIS — Z8679 Personal history of other diseases of the circulatory system: Secondary | ICD-10-CM | POA: Diagnosis not present

## 2015-01-08 DIAGNOSIS — Z9889 Other specified postprocedural states: Secondary | ICD-10-CM | POA: Diagnosis not present

## 2015-01-08 DIAGNOSIS — Z7901 Long term (current) use of anticoagulants: Secondary | ICD-10-CM | POA: Diagnosis not present

## 2015-01-08 DIAGNOSIS — I481 Persistent atrial fibrillation: Secondary | ICD-10-CM | POA: Diagnosis not present

## 2015-01-14 DIAGNOSIS — M459 Ankylosing spondylitis of unspecified sites in spine: Secondary | ICD-10-CM | POA: Insufficient documentation

## 2015-01-14 DIAGNOSIS — G4730 Sleep apnea, unspecified: Secondary | ICD-10-CM | POA: Insufficient documentation

## 2015-01-14 DIAGNOSIS — Z7901 Long term (current) use of anticoagulants: Secondary | ICD-10-CM | POA: Insufficient documentation

## 2015-01-18 ENCOUNTER — Ambulatory Visit (INDEPENDENT_AMBULATORY_CARE_PROVIDER_SITE_OTHER): Payer: Medicare Other | Admitting: Cardiology

## 2015-01-18 ENCOUNTER — Encounter: Payer: Self-pay | Admitting: Cardiology

## 2015-01-18 VITALS — BP 140/80 | HR 75 | Ht 76.0 in | Wt 223.8 lb

## 2015-01-18 DIAGNOSIS — I480 Paroxysmal atrial fibrillation: Secondary | ICD-10-CM

## 2015-01-18 DIAGNOSIS — I341 Nonrheumatic mitral (valve) prolapse: Secondary | ICD-10-CM

## 2015-01-18 DIAGNOSIS — E7800 Pure hypercholesterolemia, unspecified: Secondary | ICD-10-CM

## 2015-01-18 NOTE — Progress Notes (Signed)
Cardiology Office Note   Date:  01/18/2015   ID:  Edric Fetterman., DOB [birth year], MRN [id number]  PCP:  Precious Reel, MD  Cardiologist: Darlin Coco MD  No chief complaint on file.     History of Present Illness: Wesley [name]. is a 74 y.o. male who presents for a four-month follow-up office visit   He has a past history of atrial fibrillation but had successful ablation of his atrial fibrillation on 02/19/10 in [place]. Following his ablation he underwent successful cardioversion back in [place] in August 2011 and since then has remained in normal sinus rhythm.  Patient has a past history of hypercholesterolemia. Since we last saw the patient he has developed symptoms of peripheral neuropathy. He has seen Dr. Jannifer Franklin who suggested that even very moderate alcohol intake over a long period of time could possibly be a contributing factor. For this reason the patient has essentially stopped drinking almost completely. He never had been a heavy drinker. Since last visit he's had no new cardiac symptoms. He feels that his CPAP machine has helped him a great deal. We last saw him as a work in office visit in May 2015 after he had experienced some episodes of lightheadedness. We cut back on his metoprolol and he has had no further episodes of dizziness or lightheadedness. The patient has had some arthritis and saw Dr. Amil Amen who has placed him on meloxicam for degenerative arthritis. The patient had his annual physical examination with Dr. Virgina Jock.  Since last visit the patient has had only one subsequent episode of atrial fibrillation.  This occurred while he was in [place] the night before his appointment with Dr. Rolland Porter.  Dr. Rolland Porter did not think that the patient needed another ablation.  He advised him to cut way back on his alcohol and to work on getting his weight down to about 205 through lifestyle changes.  The patient remains on Apixaban. In regard to his  shoulder problem.  Since we last saw him he saw Dr. Esmond Plants and he received a cortisone shot in the shoulder which has helped him.  The patient has not been playing any tenderness during the summer.  He hopes to play again in the fall.  Past Medical History  Diagnosis Date  . OSA (obstructive sleep apnea)     moderate OSA NPSG 10/24/09 - AHO 21.8/hr  . Ankylosing spondylitis   . A-fib     cardioversion x2  . MVP (mitral valve prolapse)     WITH A MIDSYSTOLIC CLICK  . Irregular heart beat   . Hemorrhoids   . Peripheral neuropathy   . GERD (gastroesophageal reflux disease)   . Vitamin B12 deficiency   . Atrial flutter     Past Surgical History  Procedure Laterality Date  . Inguinal hernia repair    . Venous ligation      for varices left lower leg  . Colonoscopy    . Ablation       Current Outpatient Prescriptions  Medication Sig Dispense Refill  . Alpha-Lipoic Acid 600 MG CAPS Take 600 mg by mouth daily.    Marland Kitchen apixaban (ELIQUIS) 5 MG TABS tablet Take 1 tablet (5 mg total) by mouth 2 (two) times daily. 180 tablet 1  . Ascorbic Acid (VITAMIN C) 1000 MG tablet Take 1,000 mg by mouth daily.      Marland Kitchen b complex vitamins capsule Take 1 capsule by mouth daily.      Marland Kitchen  Cyanocobalamin 5000 MCG SUBL Place 1 tablet under the tongue daily.     Marland Kitchen esomeprazole (NEXIUM) 40 MG capsule Take 40 mg by mouth daily at 12 noon.    . gabapentin (NEURONTIN) 300 MG capsule Take 1 capsule (300 mg total) by mouth 3 (three) times daily. 270 capsule 2  . Loperamide-Simethicone 2-125 MG TABS Take 1 tablet by mouth 2 (two) times daily.     . meloxicam (MOBIC) 15 MG tablet Take 15 mg by mouth daily as needed for pain.    . metoprolol succinate (TOPROL-XL) 25 MG 24 hr tablet Take 25 mg by mouth daily.    . Multiple Vitamin (MULTIVITAMIN) tablet Take 1 tablet by mouth daily.      Marland Kitchen pyridOXINE (VITAMIN B-6) 50 MG tablet Take 50 mg by mouth 2 (two) times daily.     No current facility-administered medications  for this visit.    Allergies:   Review of patient's allergies indicates no known allergies.    Social History:  The patient  reports that he quit smoking about 45 years ago. He has never used smokeless tobacco. He reports that he drinks alcohol.   Family History:  The patient's family history includes Arthritis in his father; Cancer in his father; Heart failure in his mother; Hypertension in his mother; Prostate cancer in his father.    ROS:  Please see the history of present illness.   Otherwise, review of systems are positive for none.   All other systems are reviewed and negative.    PHYSICAL EXAM: VS:  BP 140/80 mmHg  Pulse 75  Ht 6\' 4"  (1.93 m)  Wt 223 lb 12.8 oz (101.515 kg)  BMI 27.25 kg/m2 , BMI Body mass index is 27.25 kg/(m^2). GEN: Well nourished, well developed, in no acute distress HEENT: normal Neck: no JVD, carotid bruits, or masses Cardiac: The pulse is regular.  There is a soft apical systolic murmur of mitral regurgitation.  No gallop.  No peripheral edema. Respiratory:  clear to auscultation bilaterally, normal work of breathing GI: soft, nontender, nondistended, + BS MS: no deformity or atrophy Skin: warm and dry, no rash Neuro:  Strength and sensation are intact Psych: euthymic mood, full affect   EKG:  EKG is not ordered today.    Recent Labs: No results found for requested labs within last 365 days.    Lipid Panel    Component Value Date/Time   CHOL 209* 06/20/2013 0856   TRIG 73.0 06/20/2013 0856   HDL 57.10 06/20/2013 0856   CHOLHDL 4 06/20/2013 0856   VLDL 14.6 06/20/2013 0856   LDLCALC 125* 10/19/2012 0836   LDLDIRECT 144.2 06/20/2013 0856      Wt Readings from Last 3 Encounters:  01/18/15 223 lb 12.8 oz (101.515 kg)  09/13/14 231 lb 12.8 oz (105.144 kg)  09/08/14 230 lb 6 oz (104.497 kg)        ASSESSMENT AND PLAN:  1. mild orthostatic hypotension, resolved after reducing Toprol to 25 mg daily 2. paroxysmal atrial  fibrillation, maintaining normal sinus rhythm after atrial fibrillation ablation procedure.  3. Hypercholesterolemia 4. irritable bowel syndrome 5. degenerative osteoarthritis.Followed by Dr. Netta Cedars. 6.  Ankylosing spondylitis, followed by Dr. Amil Amen  Current medicines are reviewed at length with the patient today.  The patient does not have concerns regarding medicines.  The following changes have been made:  no change  Labs/ tests ordered today include:   Orders Placed This Encounter  Procedures  . EKG 12-Lead  Continue current medication.  His lipids are followed by his PCP.  Recheck in 4 months for office visit  Signed, Darlin Coco MD 01/18/2015 10:14 AM    [facility], [place], [place]  [address] Phone: [phone]; Fax: [phone]

## 2015-01-18 NOTE — Patient Instructions (Signed)
Medication Instructions:  Your physician recommends that you continue on your current medications as directed. Please refer to the Current Medication list given to you today.  Labwork: none3  Testing/Procedures: none3  Follow-Up: Your physician wants you to follow-up in: 4 month ov You will receive a reminder letter in the mail two months in advance. If you don't receive a letter, please call our office to schedule the follow-up appointment.

## 2015-02-06 ENCOUNTER — Other Ambulatory Visit: Payer: Self-pay | Admitting: Cardiology

## 2015-02-07 NOTE — Telephone Encounter (Signed)
Medication is on discontinued med list at last ov. Is he still to be on it?

## 2015-02-25 DIAGNOSIS — Z125 Encounter for screening for malignant neoplasm of prostate: Secondary | ICD-10-CM | POA: Diagnosis not present

## 2015-02-25 DIAGNOSIS — E780 Pure hypercholesterolemia: Secondary | ICD-10-CM | POA: Diagnosis not present

## 2015-03-01 DIAGNOSIS — Z6826 Body mass index (BMI) 26.0-26.9, adult: Secondary | ICD-10-CM | POA: Diagnosis not present

## 2015-03-01 DIAGNOSIS — G629 Polyneuropathy, unspecified: Secondary | ICD-10-CM | POA: Diagnosis not present

## 2015-03-01 DIAGNOSIS — I480 Paroxysmal atrial fibrillation: Secondary | ICD-10-CM | POA: Diagnosis not present

## 2015-03-01 DIAGNOSIS — Z0000 Encounter for general adult medical examination without abnormal findings: Secondary | ICD-10-CM | POA: Diagnosis not present

## 2015-03-01 DIAGNOSIS — Z1389 Encounter for screening for other disorder: Secondary | ICD-10-CM | POA: Diagnosis not present

## 2015-03-01 DIAGNOSIS — I341 Nonrheumatic mitral (valve) prolapse: Secondary | ICD-10-CM | POA: Diagnosis not present

## 2015-03-01 DIAGNOSIS — E780 Pure hypercholesterolemia: Secondary | ICD-10-CM | POA: Diagnosis not present

## 2015-03-01 DIAGNOSIS — N401 Enlarged prostate with lower urinary tract symptoms: Secondary | ICD-10-CM | POA: Diagnosis not present

## 2015-03-01 DIAGNOSIS — G4733 Obstructive sleep apnea (adult) (pediatric): Secondary | ICD-10-CM | POA: Diagnosis not present

## 2015-03-01 DIAGNOSIS — M1990 Unspecified osteoarthritis, unspecified site: Secondary | ICD-10-CM | POA: Diagnosis not present

## 2015-03-01 DIAGNOSIS — M459 Ankylosing spondylitis of unspecified sites in spine: Secondary | ICD-10-CM | POA: Diagnosis not present

## 2015-03-04 ENCOUNTER — Other Ambulatory Visit: Payer: Self-pay | Admitting: *Deleted

## 2015-03-04 DIAGNOSIS — Z1212 Encounter for screening for malignant neoplasm of rectum: Secondary | ICD-10-CM | POA: Diagnosis not present

## 2015-03-04 MED ORDER — GABAPENTIN 300 MG PO CAPS: 300.0000 mg | ORAL_CAPSULE | Freq: Three times a day (TID) | ORAL | Status: DC

## 2015-03-24 ENCOUNTER — Other Ambulatory Visit: Payer: Self-pay | Admitting: Cardiology

## 2015-04-10 ENCOUNTER — Ambulatory Visit (INDEPENDENT_AMBULATORY_CARE_PROVIDER_SITE_OTHER): Payer: Medicare Other | Admitting: Sports Medicine

## 2015-04-10 ENCOUNTER — Encounter: Payer: Self-pay | Admitting: Sports Medicine

## 2015-04-10 VITALS — BP 114/71 | Ht 76.0 in | Wt 223.0 lb

## 2015-04-10 DIAGNOSIS — G629 Polyneuropathy, unspecified: Secondary | ICD-10-CM | POA: Diagnosis present

## 2015-04-10 MED ORDER — GABAPENTIN 300 MG PO CAPS: 300.0000 mg | ORAL_CAPSULE | Freq: Three times a day (TID) | ORAL | Status: DC

## 2015-04-10 NOTE — Progress Notes (Signed)
  Wesley [name]. - 74 y.o. male MRN [id number]  Date of birth: Jun 25, 1941  CC: peripheral neuropathy f/u  SUBJECTIVE:   HPI  Peripheral neuropathy: - Seen be neurology in the past and no  - EMG suggested a peripheral neuropathy  - Currently having numbness in feet: 3-4/10.  Right > left. Medicaiton: -- Taking gabapentin 300mg  tid. -- Gabapentin gives 50% relief.  -- No side effects other than desiring more caffeine.   -- Taking high dose B12, B6 60mg  bid, Alpha-lipoic acid - Mostly bothers him while he is driving - Very rarely notices it in his upper extremities.  - Balance:  -- Doing exercises: nightly.   -- Trouble standing on 1 foot with eyes closed.   - Interested in going to senior center.  Will likely start doing that.    ROS:     14 point RoS negative other than that listed in HPI  HISTORY: Past Medical, Surgical, Social, and Family History Reviewed & Updated per EMR.  Pertinent Historical Findings include: no recent medical changes.  OBJECTIVE: BP 114/71 mmHg  Ht 6\' 4"  (1.93 m)  Wt 223 lb (101.152 kg)  BMI 27.16 kg/m2  Physical Exam  Gen: Calm, NAD Resp: speaking in full sentences.  Non-labored breathing.  Feet: Patient's feet did not show any abnormal calluses or ulcerations. 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 No skin changes.  Sensation is decreased over b/l lower legs mostly distal to the ankles.  He has sensation to light touch and proprioception in his toes.  No pain with palpation of the feet today  MEDICATIONS, LABS & OTHER ORDERS: Previous Medications   ALPHA-LIPOIC ACID 600 MG CAPS    Take 600 mg by mouth daily.   ASCORBIC ACID (VITAMIN C) 1000 MG TABLET    Take 1,000 mg by mouth daily.     B COMPLEX VITAMINS CAPSULE    Take 1 capsule by mouth daily.     CYANOCOBALAMIN 5000 MCG SUBL    Place 1 tablet under the tongue daily.    ELIQUIS 5 MG TABS TABLET    TAKE 1 TABLET TWICE A DAY   GABAPENTIN (NEURONTIN) 300 MG CAPSULE    Take 1 capsule (300 mg  total) by mouth 3 (three) times daily.   LOPERAMIDE-SIMETHICONE 2-125 MG TABS    Take 1 tablet by mouth 2 (two) times daily.    MELOXICAM (MOBIC) 15 MG TABLET    Take 15 mg by mouth daily as needed for pain.   METOPROLOL SUCCINATE (TOPROL-XL) 25 MG 24 HR TABLET    Take 25 mg by mouth daily.   MULTIPLE VITAMIN (MULTIVITAMIN) TABLET    Take 1 tablet by mouth daily.     NEXIUM 40 MG CAPSULE    TAKE 1 CAPSULE DAILY   PYRIDOXINE (VITAMIN B-6) 50 MG TABLET    Take 50 mg by mouth 2 (two) times daily.   Modified Medications   No medications on file   New Prescriptions   No medications on file   Discontinued Medications   No medications on file  No orders of the defined types were placed in this encounter.   ASSESSMENT & PLAN: See problem based charting & AVS for pt instructions.

## 2015-04-10 NOTE — Patient Instructions (Signed)
-   Okay to try anti-oxidants including vitamin C or tart cherry juice - May also try Arnica gel.  - Continue gabapentin to 300mg  3x each day as you are doing now.  - May get a seat cushion and alternate the position on longer drives. Sciatic nerve compression while seated may worsen your symptoms.  - Continue with balance exercises.  - F/u as needed.

## 2015-04-10 NOTE — Assessment & Plan Note (Addendum)
Diagnosed with peripheral neuropathy several years ago.  Seen by neurology and no etiology identified. EMG in the past as well. Symptoms seem stable and reasonably well controlled with gabapentin and vitamin supplements including B12, B6, and alph-lipoic acid.   - Suggested option of taking anti oxidasnts including vitamin C or tart cherry juice - May also try Arnica gel.  - Continue gabapentin to 900mg  tid - Suggested he may get a seat cushion and alternating the position. Sciatic nerve compression may exacerbate the symptoms.  - Continue with balance exercises.  - f/u as needed.

## 2015-04-26 DIAGNOSIS — L821 Other seborrheic keratosis: Secondary | ICD-10-CM | POA: Diagnosis not present

## 2015-04-26 DIAGNOSIS — D1801 Hemangioma of skin and subcutaneous tissue: Secondary | ICD-10-CM | POA: Diagnosis not present

## 2015-04-26 DIAGNOSIS — D225 Melanocytic nevi of trunk: Secondary | ICD-10-CM | POA: Diagnosis not present

## 2015-04-26 DIAGNOSIS — L814 Other melanin hyperpigmentation: Secondary | ICD-10-CM | POA: Diagnosis not present

## 2015-04-26 DIAGNOSIS — L820 Inflamed seborrheic keratosis: Secondary | ICD-10-CM | POA: Diagnosis not present

## 2015-05-03 ENCOUNTER — Telehealth: Payer: Self-pay | Admitting: Cardiology

## 2015-05-03 NOTE — Telephone Encounter (Signed)
New message       Talk to [facility] regarding his presc for toprol.  He picked up his presc and it is different

## 2015-05-03 NOTE — Telephone Encounter (Signed)
Spoke with patient and advised Toprol and Metoprolol Succ same, stated he received Toprol this last time instead of Metoprolol

## 2015-05-20 ENCOUNTER — Ambulatory Visit: Payer: Medicare Other | Admitting: Cardiology

## 2015-05-24 DIAGNOSIS — L820 Inflamed seborrheic keratosis: Secondary | ICD-10-CM | POA: Diagnosis not present

## 2015-06-18 ENCOUNTER — Telehealth: Payer: Self-pay

## 2015-06-18 MED ORDER — METOPROLOL SUCCINATE ER 25 MG PO TB24: 25.0000 mg | ORAL_TABLET | Freq: Every day | ORAL | Status: DC

## 2015-06-18 MED ORDER — APIXABAN 5 MG PO TABS: 5.0000 mg | ORAL_TABLET | Freq: Two times a day (BID) | ORAL | Status: DC

## 2015-06-18 NOTE — Telephone Encounter (Signed)
Pt wants RN to call him about some questions that he has about his medications.

## 2015-06-18 NOTE — Telephone Encounter (Signed)
Left message to call back  

## 2015-06-18 NOTE — Telephone Encounter (Signed)
Refilled Metoprolol and Eliquis for patient

## 2015-06-24 ENCOUNTER — Ambulatory Visit: Payer: Medicare Other | Admitting: Cardiology

## 2015-07-11 DIAGNOSIS — M150 Primary generalized (osteo)arthritis: Secondary | ICD-10-CM | POA: Diagnosis not present

## 2015-07-11 DIAGNOSIS — M5136 Other intervertebral disc degeneration, lumbar region: Secondary | ICD-10-CM | POA: Diagnosis not present

## 2015-07-11 DIAGNOSIS — M450 Ankylosing spondylitis of multiple sites in spine: Secondary | ICD-10-CM | POA: Diagnosis not present

## 2015-07-11 DIAGNOSIS — I480 Paroxysmal atrial fibrillation: Secondary | ICD-10-CM | POA: Diagnosis not present

## 2015-08-13 DIAGNOSIS — Z9889 Other specified postprocedural states: Secondary | ICD-10-CM | POA: Diagnosis not present

## 2015-08-13 DIAGNOSIS — Z7901 Long term (current) use of anticoagulants: Secondary | ICD-10-CM | POA: Diagnosis not present

## 2015-08-13 DIAGNOSIS — Z8679 Personal history of other diseases of the circulatory system: Secondary | ICD-10-CM | POA: Diagnosis not present

## 2015-08-13 DIAGNOSIS — I481 Persistent atrial fibrillation: Secondary | ICD-10-CM | POA: Diagnosis not present

## 2015-08-15 ENCOUNTER — Ambulatory Visit: Payer: Medicare Other | Admitting: Cardiology

## 2015-08-23 DIAGNOSIS — N401 Enlarged prostate with lower urinary tract symptoms: Secondary | ICD-10-CM | POA: Diagnosis not present

## 2015-08-23 DIAGNOSIS — Z8042 Family history of malignant neoplasm of prostate: Secondary | ICD-10-CM | POA: Diagnosis not present

## 2015-09-03 DIAGNOSIS — R3916 Straining to void: Secondary | ICD-10-CM | POA: Diagnosis not present

## 2015-09-03 DIAGNOSIS — Z0000 Encounter for general adult medical examination without abnormal findings: Secondary | ICD-10-CM | POA: Diagnosis not present

## 2015-09-03 DIAGNOSIS — H40013 Open angle with borderline findings, low risk, bilateral: Secondary | ICD-10-CM | POA: Diagnosis not present

## 2015-09-03 DIAGNOSIS — H35372 Puckering of macula, left eye: Secondary | ICD-10-CM | POA: Diagnosis not present

## 2015-09-03 DIAGNOSIS — N401 Enlarged prostate with lower urinary tract symptoms: Secondary | ICD-10-CM | POA: Diagnosis not present

## 2015-09-03 DIAGNOSIS — N5201 Erectile dysfunction due to arterial insufficiency: Secondary | ICD-10-CM | POA: Diagnosis not present

## 2015-09-03 DIAGNOSIS — H43813 Vitreous degeneration, bilateral: Secondary | ICD-10-CM | POA: Diagnosis not present

## 2015-09-06 ENCOUNTER — Encounter: Payer: Self-pay | Admitting: Cardiology

## 2015-09-06 ENCOUNTER — Ambulatory Visit (INDEPENDENT_AMBULATORY_CARE_PROVIDER_SITE_OTHER): Payer: Medicare Other | Admitting: Cardiology

## 2015-09-06 VITALS — BP 122/68 | HR 78 | Ht 76.0 in | Wt 205.1 lb

## 2015-09-06 DIAGNOSIS — I480 Paroxysmal atrial fibrillation: Secondary | ICD-10-CM

## 2015-09-06 NOTE — Patient Instructions (Signed)
Medication Instructions:  Your physician recommends that you continue on your current medications as directed. Please refer to the Current Medication list given to you today.  Labwork: none  Testing/Procedures: none  Follow-Up: Your physician wants you to follow-up in: 6 month ov with Dr Stanford Breed at the [facility] office You will receive a reminder letter in the mail two months in advance. If you don't receive a letter, please call our office to schedule the follow-up appointment.  If you need a refill on your cardiac medications before your next appointment, please call your pharmacy.

## 2015-09-06 NOTE — Progress Notes (Signed)
Cardiology Office Note   Date:  09/06/2015   ID:  Wesley Maroun., DOB 06-30-1941, MRN [id number]  PCP:  Precious Reel, MD  Cardiologist: Darlin Coco MD  No chief complaint on file.     History of Present Illness: Wesley [name]. is a 75 y.o. male who presents for Scheduled follow-up office visit.  He has a past history of atrial fibrillation but had successful ablation of his atrial fibrillation on 02/19/10 in [place]. Following his ablation he underwent successful cardioversion back in [place] in August 2011 and since then has remained in normal sinus rhythm. [name] has a past history of hypercholesterolemia. Since we last saw Wesley [name] he has developed symptoms of peripheral neuropathy. He has seen Dr. Jannifer Franklin who suggested that even very moderate alcohol intake over a long period of time could possibly be a contributing factor. For this reason Wesley [name] has essentially stopped drinking almost completely. He never had been a heavy drinker. Since last visit he's had no new cardiac symptoms. He feels that his CPAP machine has helped him a great deal. We last saw him as a work in office visit in May 2015 after he had experienced some episodes of lightheadedness. We cut back on his metoprolol and he has had no further episodes of dizziness or lightheadedness. Wesley [name] has had some arthritis and saw Dr. Amil Amen who has placed him on meloxicam for degenerative arthritis. Wesley [name] had his annual physical examination with Dr. Virgina Jock.  Since last visit Wesley [name] has had occasional brief episodes of possible atrial fibrillation.  He was seen by Dr. Rolland Porter on 08/13/15.  Dr. Rolland Porter suggested a 30 day monitor to try to document his arrhythmia.  He is wearing Wesley monitor now.  Dr. Alinda Money emphasized to him Wesley importance of weight loss and of successful treatment of his sleep apnea and also having no more than 2 alcoholic drinks per week.  Since we last saw Wesley  [name] is weight is down 18 pounds since September 2016.   Past Medical History  Diagnosis Date  . OSA (obstructive sleep apnea)     moderate OSA NPSG 10/24/09 - AHO 21.8/hr  . Ankylosing spondylitis ([place])   . A-fib [facility])     cardioversion x2  . MVP (mitral valve prolapse)     WITH A MIDSYSTOLIC CLICK  . Irregular heart beat   . Hemorrhoids   . Peripheral neuropathy ([place])   . GERD (gastroesophageal reflux disease)   . Vitamin B12 deficiency   . Atrial flutter [facility])     Past Surgical History  Procedure Laterality Date  . Inguinal hernia repair    . Venous ligation      for varices left lower leg  . Colonoscopy    . Ablation       Current Outpatient Prescriptions  Medication Sig Dispense Refill  . Alpha-Lipoic Acid 600 MG CAPS Take 600 mg by mouth daily.    Marland Kitchen apixaban (ELIQUIS) 5 MG TABS tablet Take 1 tablet (5 mg total) by mouth 2 (two) times daily. 180 tablet 3  . Ascorbic Acid (VITAMIN C) 1000 MG tablet Take 1,000 mg by mouth daily.      Marland Kitchen b complex vitamins capsule Take 1 capsule by mouth daily.      . Cyanocobalamin 5000 MCG SUBL Place 1 tablet under Wesley tongue daily.     Marland Kitchen gabapentin (NEURONTIN) 300 MG capsule Take 1 capsule (300 mg total) by mouth 3 (three)  times daily. 270 capsule 2  . Loperamide-Simethicone 2-125 MG TABS Take 1 tablet by mouth as needed.     . meloxicam (MOBIC) 15 MG tablet Take 15 mg by mouth daily as needed for pain.    . metoprolol succinate (TOPROL-XL) 25 MG 24 hr tablet Take 1 tablet (25 mg total) by mouth daily. 90 tablet 3  . Multiple Vitamin (MULTIVITAMIN) tablet Take 1 tablet by mouth daily.      Marland Kitchen NEXIUM 40 MG capsule TAKE 1 CAPSULE DAILY 90 capsule 3  . pyridOXINE (VITAMIN B-6) 50 MG tablet Take 50 mg by mouth 2 (two) times daily.     No current facility-administered medications for this visit.    Allergies:   Review of [name]'s allergies indicates no known allergies.    Social History:  Wesley [name]  reports that he quit smoking  about 46 years ago. He has never used smokeless tobacco. He reports that he drinks alcohol.   Family History:  Wesley [name]'s family history includes Arthritis in his father; Cancer in his father; Heart failure in his mother; Hypertension in his mother; Prostate cancer in his father.    ROS:  Please see Wesley history of present illness.   Otherwise, review of systems are positive for none.   All other systems are reviewed and negative.    PHYSICAL EXAM: VS:  BP 122/68 mmHg  Pulse 78  Ht 6\' 4"  (1.93 m)  Wt 205 lb 1.9 oz (93.042 kg)  BMI 24.98 kg/m2  SpO2 98% , BMI Body mass index is 24.98 kg/(m^2). GEN: Well nourished, well developed, in no acute distress HEENT: normal Neck: no JVD, carotid bruits, or masses Cardiac: RRR; no murmurs, rubs, or gallops,no edema  Respiratory:  clear to auscultation bilaterally, normal work of breathing GI: soft, nontender, nondistended, + BS MS: no deformity or atrophy Skin: warm and dry, no rash Neuro:  Strength and sensation are intact Psych: euthymic mood, full affect   EKG:  EKG is not ordered today.   Recent Labs: No results found for requested labs within last 365 days.    Lipid Panel    Component Value Date/Time   CHOL 209* 06/20/2013 0856   TRIG 73.0 06/20/2013 0856   HDL 57.10 06/20/2013 0856   CHOLHDL 4 06/20/2013 0856   VLDL 14.6 06/20/2013 0856   LDLCALC 125* 10/19/2012 0836   LDLDIRECT 144.2 06/20/2013 0856      Wt Readings from Last 3 Encounters:  09/06/15 205 lb 1.9 oz (93.042 kg)  04/10/15 223 lb (101.152 kg)  01/18/15 223 lb 12.8 oz (101.515 kg)         ASSESSMENT AND PLAN:  1. mild orthostatic hypotension, resolved after reducing Toprol to 25 mg daily 2. paroxysmal atrial fibrillation, maintaining normal sinus rhythm after atrial fibrillation ablation procedure. He has had occasional short runs of atrial fibrillation.  He currently is wearing a 30 day monitor given to him by Dr. Rolland Porter 3.  Hypercholesterolemia.Not on statin.  Controlling with diet and exercise.  Labs are followed by Dr. Virgina Jock 4. irritable bowel syndrome 5. degenerative osteoarthritis.Followed by Dr. Netta Cedars. 6. Ankylosing spondylitis, followed by Dr. Amil Amen   Current medicines are reviewed at length with Wesley [name] today.  Wesley [name] does not have concerns regarding medicines.  Wesley following changes have been made:  no change  Labs/ tests ordered today include:  No orders of Wesley defined types were placed in this encounter.     Disposition:   Continue current medication.  He is tolerating Apixaban without difficulty.Recheck with Dr. Stanford Breed in 6 months for follow-up office visit.  Berna Spare MD 09/06/2015 1:15 PM    [facility] [address], [place], [place]  [address] Phone: [phone]; Fax: [phone]

## 2015-09-26 ENCOUNTER — Telehealth: Payer: Self-pay | Admitting: Vascular Surgery

## 2015-09-26 NOTE — Telephone Encounter (Signed)
Returned pts VM to [phone]. No answer, LM for pt to cb and schedule. He is requesting an appt for VVs of RLE

## 2015-09-26 NOTE — Telephone Encounter (Signed)
Spoke with pt, dpm °

## 2015-09-30 ENCOUNTER — Encounter: Payer: Self-pay | Admitting: Vascular Surgery

## 2015-09-30 ENCOUNTER — Other Ambulatory Visit: Payer: Self-pay | Admitting: *Deleted

## 2015-09-30 DIAGNOSIS — I83893 Varicose veins of bilateral lower extremities with other complications: Secondary | ICD-10-CM

## 2015-10-01 ENCOUNTER — Ambulatory Visit (HOSPITAL_COMMUNITY)
Admission: RE | Admit: 2015-10-01 | Discharge: 2015-10-01 | Disposition: A | Discharge status: Home / Self Care | Payer: Medicare Other | Source: Ambulatory Visit | Attending: Vascular Surgery | Admitting: Vascular Surgery

## 2015-10-01 ENCOUNTER — Encounter: Payer: Self-pay | Admitting: Vascular Surgery

## 2015-10-01 ENCOUNTER — Ambulatory Visit (INDEPENDENT_AMBULATORY_CARE_PROVIDER_SITE_OTHER): Payer: Medicare Other | Admitting: Vascular Surgery

## 2015-10-01 VITALS — BP 114/71 | HR 64 | Temp 98.0°F | Resp 16 | Ht 76.0 in | Wt 200.0 lb

## 2015-10-01 DIAGNOSIS — I83893 Varicose veins of bilateral lower extremities with other complications: Secondary | ICD-10-CM | POA: Diagnosis not present

## 2015-10-01 DIAGNOSIS — I83891 Varicose veins of right lower extremities with other complications: Secondary | ICD-10-CM | POA: Diagnosis not present

## 2015-10-01 DIAGNOSIS — R609 Edema, unspecified: Secondary | ICD-10-CM | POA: Diagnosis present

## 2015-10-01 DIAGNOSIS — I83899 Varicose veins of unspecified lower extremities with other complications: Secondary | ICD-10-CM | POA: Insufficient documentation

## 2015-10-01 NOTE — Progress Notes (Signed)
Vascular and Vein Specialist of [facility]  Patient name: Wesley [name]. MRN: [id number] DOB: [birth year] Sex: male  REASON FOR CONSULT: Varicose veins  HPI: Wesley [name]. is a 75 y.o. male, who presents for evaluation of right lower extremity varicose veins. The patient has a history of prior left great saphenous vein stripping years ago. The patient reports throbbing, heaviness, aching and swelling of his right leg. This is worse towards the end of the day. He has had this issue for the past 8-9 years. He also mentions bulging of his veins at his right medial leg that has occurred in the past 5 years. He has discomfort with the bulges. The patient has worn knee-high compression stockings for the past 40 years. This has provided minimal relief.  The patient is active and exercises regularly. He says that his symptoms do interfere with his daily exercise. The patient denies any claudication or rest pain symptoms.  The patient has a past medical history of atrial fibrillation status post cardioversion 5 years ago. He is on Eliquis. He reports some issues with arrhythmias over the past year and has been on a monitor rrecently. He is being followed by an EP physician in [place]. His main cardiologist is Dr. Stanford Breed. The patient denies any chest pain, chest discomfort, shortness of breath. He has no history of stroke. The patient does endorse peripheral neuropathy.    Past Medical History  Diagnosis Date  . OSA (obstructive sleep apnea)     moderate OSA NPSG 10/24/09 - AHO 21.8/hr  . Ankylosing spondylitis ([place])   . A-fib [facility])     cardioversion x2  . MVP (mitral valve prolapse)     WITH A MIDSYSTOLIC CLICK  . Irregular heart beat   . Hemorrhoids   . Peripheral neuropathy ([place])   . GERD (gastroesophageal reflux disease)   . Vitamin B12 deficiency   . Atrial flutter ([place])     Family History  Problem Relation Age of Onset  . Prostate cancer Father   . Arthritis Father   .  Cancer Father   . Heart failure Mother   . Hypertension Mother     SOCIAL HISTORY: Social History   Social History  . Marital Status: Married    Spouse Name: N/A  . Number of Children: 2  . Years of Education: 18   Occupational History  . sales   .     Social History Main Topics  . Smoking status: Former Smoker    Quit date: 07/27/1969  . Smokeless tobacco: Never Used  . Alcohol Use: Yes     Comment: 1-2 most days  . Drug Use: Not on file  . Sexual Activity: Not on file   Other Topics Concern  . Not on file   Social History Narrative    No Known Allergies  Current Outpatient Prescriptions  Medication Sig Dispense Refill  . Alpha-Lipoic Acid 600 MG CAPS Take 600 mg by mouth daily.    Marland Kitchen apixaban (ELIQUIS) 5 MG TABS tablet Take 1 tablet (5 mg total) by mouth 2 (two) times daily. 180 tablet 3  . Ascorbic Acid (VITAMIN C) 1000 MG tablet Take 1,000 mg by mouth daily.      Marland Kitchen b complex vitamins capsule Take 1 capsule by mouth daily.      . Cyanocobalamin 5000 MCG SUBL Place 1 tablet under the tongue daily.     Marland Kitchen gabapentin (NEURONTIN) 300 MG capsule Take 1 capsule (300 mg total) by  mouth 3 (three) times daily. 270 capsule 2  . Loperamide-Simethicone 2-125 MG TABS Take 1 tablet by mouth as needed.     . meloxicam (MOBIC) 15 MG tablet Take 15 mg by mouth daily as needed for pain.    . metoprolol succinate (TOPROL-XL) 25 MG 24 hr tablet Take 1 tablet (25 mg total) by mouth daily. 90 tablet 3  . Multiple Vitamin (MULTIVITAMIN) tablet Take 1 tablet by mouth daily.      Marland Kitchen NEXIUM 40 MG capsule TAKE 1 CAPSULE DAILY 90 capsule 3  . pyridOXINE (VITAMIN B-6) 50 MG tablet Take 50 mg by mouth 2 (two) times daily.     No current facility-administered medications for this visit.    REVIEW OF SYSTEMS:  [X]  denotes positive finding, [ ]  denotes negative finding Cardiac  Comments:  Chest pain or chest pressure:    Shortness of breath upon exertion:    Short of breath when lying flat:     Irregular heart rhythm:        Vascular    Pain in calf, thigh, or hip brought on by ambulation:    Pain in feet at night that wakes you up from your sleep:     Blood clot in your veins:    Leg swelling:         Pulmonary    Oxygen at home:    Productive cough:     Wheezing:         Neurologic    Sudden weakness in arms or legs:     Sudden numbness in arms or legs:     Sudden onset of difficulty speaking or slurred speech:    Temporary loss of vision in one eye:     Problems with dizziness:         Gastrointestinal    Blood in stool:     Vomited blood:         Genitourinary    Burning when urinating:     Blood in urine:        Psychiatric    Major depression:         Hematologic    Bleeding problems:    Problems with blood clotting too easily:        Skin    Rashes or ulcers:        Constitutional    Fever or chills:      PHYSICAL EXAM: Filed Vitals:   10/01/15 1233  BP: 114/71  Pulse: 64  Temp: 98 F (36.7 C)  Resp: 16  Height: 6\' 4"  (1.93 m)  Weight: 200 lb (90.719 kg)  SpO2: 99%    GENERAL: The patient is a well-nourished male, in no acute distress. The vital signs are documented above. CARDIAC: There is a regular rate and rhythm. No carotid bruits VASCULAR: 2+ radial pulses bilaterally, symmetric. Palpable dorsalis pedis and posterior tibial pulses bilaterally. Palpable right great saphenous vein at mid thigh. Very large bulging tortuous varicosity at medial thigh to distal calf. Right leg is swollen and larger in diameter then left leg. PULMONARY: There is good air exchange bilaterally without wheezing or rales. ABDOMEN: Soft and non-tender with normal pitched bowel sounds.  MUSCULOSKELETAL: There are no major deformities or cyanosis. NEUROLOGIC: No focal weakness or paresthesias are detected. SKIN: There are no ulcers or rashes noted. PSYCHIATRIC: The patient has a normal affect.  DATA:  Lower extremity venous reflux exam 10/01/2015  RLE:  No evidence of DVT. Common femoral reflux present.  Great saphenous reflux present with diameters of 1.15 cm at the distal thigh to 1.26 cm at the saphenofemoral junction. Large tortuous varicosity coming from mid thigh.  MEDICAL ISSUES: Chronic right venous insufficiency  The patient had evidence of chronic right venous insufficiency with large tortuous varicosities. He has heaviness, throbbing and swelling of his right leg. He has a history of left vein stripping. Will recommend conservative treatment with a three month trial of thigh-high 20-30 mmHg compression stockings, leg elevation, and ibuprofen. If his symptoms are not resolved with conservative therapy, we'll recommend laser ablation of the right great saphenous vein. He will also be evaluated for greater than 20 stab phlebectomies. He will return in 3 months for further evaluation. The patient is on Eliquis for atrial fibrillation. His cardiologist is Dr. Stanford Breed. He will discuss with his physician prior to any venous procedures regarding his anticoagulation.   Virgina Jock, PA-C [facility] [place] [phone] Jolaine Click with above assessment Patient has extensive large bulbous varicosities distal half right medial thigh and entire medial calf which are quite extensive due to gross reflux from large right great saphenous vein. These are affecting his daily living. We will attempt conservative measures for 3 months and he will return for further evaluation

## 2015-10-04 NOTE — Progress Notes (Signed)
HPI: Follow-up atrial fibrillation. Previously followed by Dr. Mare Ferrari. Nuclear study March 2006 was normal. Echocardiogram November 2010 showed normal LV systolic function, mild biatrial enlargement, mitral valve prolapse with mild mitral regurgitation and mild tricuspid regurgitation. Had successful ablation of his atrial fibrillation on 02/19/10 in [place]. Following his ablation he underwent successful cardioversion back in [place] in August 2011 and since then has remained in normal sinus rhythm. Patient was wearing event monitor at last ov with Dr Mare Ferrari because of recurrent palpitations (monitor had been ordered by Dr Rolland Porter). This apparently showed no A. Fib but I do not have final results available. Since last seen, He has occasional brief palpitations nonsustained. No dyspnea, chest pain, syncope or bleeding.  Current Outpatient Prescriptions  Medication Sig Dispense Refill  . Alpha-Lipoic Acid 600 MG CAPS Take 600 mg by mouth daily.    Marland Kitchen apixaban (ELIQUIS) 5 MG TABS tablet Take 1 tablet (5 mg total) by mouth 2 (two) times daily. 180 tablet 3  . Ascorbic Acid (VITAMIN C) 1000 MG tablet Take 1,000 mg by mouth daily.      Marland Kitchen b complex vitamins capsule Take 1 capsule by mouth daily.      . Cyanocobalamin 5000 MCG SUBL Place 1 tablet under the tongue daily.     Marland Kitchen gabapentin (NEURONTIN) 300 MG capsule Take 1 capsule (300 mg total) by mouth 3 (three) times daily. 270 capsule 2  . Loperamide-Simethicone 2-125 MG TABS Take 1 tablet by mouth as needed.     . meloxicam (MOBIC) 15 MG tablet Take 15 mg by mouth daily as needed for pain.    . metoprolol succinate (TOPROL-XL) 25 MG 24 hr tablet Take 1 tablet (25 mg total) by mouth daily. 90 tablet 3  . Multiple Vitamin (MULTIVITAMIN) tablet Take 1 tablet by mouth daily.      Marland Kitchen NEXIUM 40 MG capsule TAKE 1 CAPSULE DAILY 90 capsule 3  . pyridOXINE (VITAMIN B-6) 50 MG tablet Take 50 mg by mouth 2 (two) times daily.     No  current facility-administered medications for this visit.     Past Medical History  Diagnosis Date  . OSA (obstructive sleep apnea)     moderate OSA NPSG 10/24/09 - AHO 21.8/hr  . Ankylosing spondylitis ([place])   . A-fib [facility])     cardioversion x2  . MVP (mitral valve prolapse)     WITH A MIDSYSTOLIC CLICK  . Irregular heart beat   . Hemorrhoids   . Peripheral neuropathy ([place])   . GERD (gastroesophageal reflux disease)   . Vitamin B12 deficiency   . Atrial flutter [facility])     Past Surgical History  Procedure Laterality Date  . Inguinal hernia repair    . Venous ligation      for varices left lower leg  . Colonoscopy    . Ablation      Social History   Social History  . Marital Status: Married    Spouse Name: N/A  . Number of Children: 2  . Years of Education: 18   Occupational History  . sales   .     Social History Main Topics  . Smoking status: Former Smoker    Quit date: 07/27/1969  . Smokeless tobacco: Never Used  . Alcohol Use: Yes     Comment: 1-2 most days  . Drug Use: Not on file  . Sexual Activity: Not on file   Other Topics Concern  . Not on  file   Social History Narrative    Family History  Problem Relation Age of Onset  . Prostate cancer Father   . Arthritis Father   . Cancer Father   . Heart failure Mother   . Hypertension Mother     ROS: no fevers or chills, productive cough, hemoptysis, dysphasia, odynophagia, melena, hematochezia, dysuria, hematuria, rash, seizure activity, orthopnea, PND, pedal edema, claudication. Remaining systems are negative.  Physical Exam: Well-developed well-nourished in no acute distress.  Skin is warm and dry.  HEENT is normal.  Neck is supple.  Chest is clear to auscultation with normal expansion.  Cardiovascular exam is regular rate and rhythm.  Abdominal exam nontender or distended. No masses palpated. Positive bruit Extremities show no edema. neuro grossly intact  ECG Sinus rhythmFirst-degree AV  block, left anterior fascicular block, no ST changes.

## 2015-10-10 ENCOUNTER — Ambulatory Visit (INDEPENDENT_AMBULATORY_CARE_PROVIDER_SITE_OTHER): Payer: Medicare Other | Admitting: Cardiology

## 2015-10-10 ENCOUNTER — Encounter: Payer: Self-pay | Admitting: Cardiology

## 2015-10-10 VITALS — BP 114/76 | HR 72 | Ht 75.0 in | Wt 199.0 lb

## 2015-10-10 DIAGNOSIS — R0989 Other specified symptoms and signs involving the circulatory and respiratory systems: Secondary | ICD-10-CM | POA: Diagnosis not present

## 2015-10-10 DIAGNOSIS — I4891 Unspecified atrial fibrillation: Secondary | ICD-10-CM

## 2015-10-10 DIAGNOSIS — Z87891 Personal history of nicotine dependence: Secondary | ICD-10-CM | POA: Diagnosis not present

## 2015-10-10 NOTE — Patient Instructions (Signed)
Testing/Procedures:  Your physician has requested that you have an echocardiogram. Echocardiography is a painless test that uses sound waves to create images of your heart. It provides your doctor with information about the size and shape of your heart and how well your heart's chambers and valves are working. This procedure takes approximately one hour. There are no restrictions for this procedure.   Your physician has requested that you have an abdominal aorta duplex. During this test, an ultrasound is used to evaluate the aorta. Allow 30 minutes for this exam. Do not eat after midnight the day before and avoid carbonated beverages   Follow-Up:  Your physician wants you to follow-up in: [place] will receive a reminder letter in the mail two months in advance. If you don't receive a letter, please call our office to schedule the follow-up appointment.  If you need a refill on your cardiac medications before your next appointment, please call your pharmacy.

## 2015-10-10 NOTE — Assessment & Plan Note (Signed)
Plan repeat echocardiogram. 

## 2015-10-10 NOTE — Assessment & Plan Note (Signed)
Plan to continue beta blocker for now. Continue apixaban. Hemoglobin and renal function monitored by primary care. Note patient is also followed in [place] for his atrial fibrillation.

## 2015-10-10 NOTE — Assessment & Plan Note (Signed)
Schedule Ultrasound to exclude aneurysm.

## 2015-10-16 ENCOUNTER — Inpatient Hospital Stay (HOSPITAL_COMMUNITY): Admission: RE | Admit: 2015-10-16 | Payer: Medicare Other | Source: Ambulatory Visit

## 2015-10-17 ENCOUNTER — Encounter (HOSPITAL_COMMUNITY): Payer: Medicare Other

## 2015-10-18 ENCOUNTER — Other Ambulatory Visit: Payer: Self-pay | Admitting: Cardiology

## 2015-10-18 ENCOUNTER — Ambulatory Visit (HOSPITAL_COMMUNITY)
Admission: RE | Admit: 2015-10-18 | Discharge: 2015-10-18 | Disposition: A | Discharge status: Home / Self Care | Payer: Medicare Other | Source: Ambulatory Visit | Attending: Cardiology | Admitting: Cardiology

## 2015-10-18 DIAGNOSIS — R0989 Other specified symptoms and signs involving the circulatory and respiratory systems: Secondary | ICD-10-CM

## 2015-10-18 DIAGNOSIS — K219 Gastro-esophageal reflux disease without esophagitis: Secondary | ICD-10-CM | POA: Insufficient documentation

## 2015-10-18 DIAGNOSIS — Z87891 Personal history of nicotine dependence: Secondary | ICD-10-CM

## 2015-10-18 DIAGNOSIS — G4733 Obstructive sleep apnea (adult) (pediatric): Secondary | ICD-10-CM | POA: Diagnosis not present

## 2015-10-30 ENCOUNTER — Other Ambulatory Visit (HOSPITAL_COMMUNITY): Payer: Medicare Other

## 2015-10-31 ENCOUNTER — Other Ambulatory Visit: Payer: Self-pay

## 2015-10-31 ENCOUNTER — Ambulatory Visit (HOSPITAL_COMMUNITY): Payer: Medicare Other | Attending: Cardiology

## 2015-10-31 DIAGNOSIS — I77810 Thoracic aortic ectasia: Secondary | ICD-10-CM | POA: Diagnosis not present

## 2015-10-31 DIAGNOSIS — G4733 Obstructive sleep apnea (adult) (pediatric): Secondary | ICD-10-CM | POA: Insufficient documentation

## 2015-10-31 DIAGNOSIS — I340 Nonrheumatic mitral (valve) insufficiency: Secondary | ICD-10-CM | POA: Insufficient documentation

## 2015-10-31 DIAGNOSIS — I4891 Unspecified atrial fibrillation: Secondary | ICD-10-CM | POA: Diagnosis not present

## 2015-10-31 DIAGNOSIS — I071 Rheumatic tricuspid insufficiency: Secondary | ICD-10-CM | POA: Diagnosis not present

## 2015-10-31 DIAGNOSIS — I358 Other nonrheumatic aortic valve disorders: Secondary | ICD-10-CM | POA: Insufficient documentation

## 2015-10-31 DIAGNOSIS — I517 Cardiomegaly: Secondary | ICD-10-CM | POA: Insufficient documentation

## 2015-10-31 DIAGNOSIS — I371 Nonrheumatic pulmonary valve insufficiency: Secondary | ICD-10-CM | POA: Insufficient documentation

## 2015-10-31 DIAGNOSIS — I272 Other secondary pulmonary hypertension: Secondary | ICD-10-CM | POA: Diagnosis not present

## 2015-12-18 DIAGNOSIS — J302 Other seasonal allergic rhinitis: Secondary | ICD-10-CM | POA: Diagnosis not present

## 2015-12-18 DIAGNOSIS — J209 Acute bronchitis, unspecified: Secondary | ICD-10-CM | POA: Diagnosis not present

## 2015-12-18 DIAGNOSIS — Z6825 Body mass index (BMI) 25.0-25.9, adult: Secondary | ICD-10-CM | POA: Diagnosis not present

## 2015-12-30 ENCOUNTER — Other Ambulatory Visit: Payer: Self-pay | Admitting: Sports Medicine

## 2015-12-31 ENCOUNTER — Other Ambulatory Visit: Payer: Self-pay | Admitting: *Deleted

## 2015-12-31 DIAGNOSIS — I83811 Varicose veins of right lower extremities with pain: Secondary | ICD-10-CM

## 2016-01-02 ENCOUNTER — Encounter: Payer: Self-pay | Admitting: Vascular Surgery

## 2016-01-07 ENCOUNTER — Ambulatory Visit (INDEPENDENT_AMBULATORY_CARE_PROVIDER_SITE_OTHER): Payer: Medicare Other | Admitting: Vascular Surgery

## 2016-01-07 ENCOUNTER — Encounter: Payer: Self-pay | Admitting: Vascular Surgery

## 2016-01-07 VITALS — BP 113/68 | HR 59 | Temp 98.0°F | Resp 16 | Ht 76.0 in | Wt 204.0 lb

## 2016-01-07 DIAGNOSIS — I83891 Varicose veins of right lower extremities with other complications: Secondary | ICD-10-CM | POA: Diagnosis not present

## 2016-01-07 NOTE — Progress Notes (Signed)
Subjective:     Patient ID: Wesley Chris., male   DOB: 1940/08/01, 75 y.o.   MRN: [id number]  HPI this 75 year old male returns for continued follow-up regarding his painful varicosities in the right leg. He has worn long leg elastic compression stockings 20-30 millimeter gradient and is tried elevation and ibuprofen but continues to have aching throbbing and burning discomfort plus chronic swelling in the right leg. The symptoms are affecting his daily living and have not improved. He has gross reflux in the right great saphenous vein supplying these painful varicosities which are extensive.  Past Medical History  Diagnosis Date  . OSA (obstructive sleep apnea)     moderate OSA NPSG 10/24/09 - AHO 21.8/hr  . Ankylosing spondylitis ([place])   . A-fib [facility])     cardioversion x2  . MVP (mitral valve prolapse)     WITH A MIDSYSTOLIC CLICK  . Irregular heart beat   . Hemorrhoids   . Peripheral neuropathy ([place])   . GERD (gastroesophageal reflux disease)   . Vitamin B12 deficiency   . Atrial flutter [facility])     Social History  Substance Use Topics  . Smoking status: Former Smoker    Quit date: 07/27/1969  . Smokeless tobacco: Never Used  . Alcohol Use: Yes     Comment: 1-2 most days    Family History  Problem Relation Age of Onset  . Prostate cancer Father   . Arthritis Father   . Cancer Father   . Heart failure Mother   . Hypertension Mother     No Known Allergies   Current outpatient prescriptions:  .  Alpha-Lipoic Acid 600 MG CAPS, Take 600 mg by mouth daily., Disp: , Rfl:  .  apixaban (ELIQUIS) 5 MG TABS tablet, Take 1 tablet (5 mg total) by mouth 2 (two) times daily., Disp: 180 tablet, Rfl: 3 .  Ascorbic Acid (VITAMIN C) 1000 MG tablet, Take 1,000 mg by mouth daily.  , Disp: , Rfl:  .  b complex vitamins capsule, Take 1 capsule by mouth daily.  , Disp: , Rfl:  .  Cyanocobalamin 5000 MCG SUBL, Place 1 tablet under the tongue daily. , Disp: , Rfl:  .  gabapentin (NEURONTIN)  300 MG capsule, Take 1 capsule (300 mg total) by mouth 3 (three) times daily., Disp: 270 capsule, Rfl: 2 .  gabapentin (NEURONTIN) 300 MG capsule, TAKE 1 CAPSULE THREE TIMES A DAY, Disp: 270 capsule, Rfl: 1 .  Loperamide-Simethicone 2-125 MG TABS, Take 1 tablet by mouth as needed. , Disp: , Rfl:  .  meloxicam (MOBIC) 15 MG tablet, Take 15 mg by mouth daily as needed for pain., Disp: , Rfl:  .  metoprolol succinate (TOPROL-XL) 25 MG 24 hr tablet, Take 1 tablet (25 mg total) by mouth daily., Disp: 90 tablet, Rfl: 3 .  Multiple Vitamin (MULTIVITAMIN) tablet, Take 1 tablet by mouth daily.  , Disp: , Rfl:  .  NEXIUM 40 MG capsule, TAKE 1 CAPSULE DAILY, Disp: 90 capsule, Rfl: 3 .  pyridOXINE (VITAMIN B-6) 50 MG tablet, Take 50 mg by mouth 2 (two) times daily., Disp: , Rfl:   Filed Vitals:   01/07/16 1019  BP: 113/68  Pulse: 59  Temp: 98 F (36.7 C)  Resp: 16  Height: 6\' 4"  (1.93 m)  Weight: 204 lb (92.534 kg)  SpO2: 100%    Body mass index is 24.84 kg/(m^2).           Review of Systems as chest  pain, dyspnea on exertion, PND, orthopnea, hemoptysis. Does have history of ablation for atrial fib performed by cardiologist in [place]. Has given the okay to discontinue anticoagulation 4 days prior to the procedure and resume 1-2 days post procedure.     Objective:   Physical Exam BP 113/68 mmHg  Pulse 59  Temp(Src) 98 F (36.7 C)  Resp 16  Ht 6\' 4"  (1.93 m)  Wt 204 lb (92.534 kg)  BMI 24.84 kg/m2  SpO2 100%  Gen. well-developed well-nourished male no apparent distress alert and oriented 3 Lungs no rhonchi or wheezing Right leg with extensive large bulging varicosities beginning in the mid medial thigh extending down to the medial malleolus and in the posterior calf. Chronic 1+ edema. No active ulceration noted. 3+ dorsalis pedis pulse palpable.  Formal duplex exam last visit revealed gross reflux and large caliber right great saphenous vein supplying these painful  varicosities with no DVT     Assessment:     Painful varicosities right leg due to gross reflux right great saphenous vein which are severely symptomatic and affecting patient's daily living. These are resistant to conservative measures including long-leg elastic compression stockings, elevation, and ibuprofen.    Plan:     Patient needs #1 laser ablation right great saphenous vein followed by three-month waiting period. He will then be evaluated for multiple stab phlebectomy-greater than 20 of painful varicosities to complete his treatment regimen We will proceed with precertification to perform this in the near future

## 2016-01-09 DIAGNOSIS — M450 Ankylosing spondylitis of multiple sites in spine: Secondary | ICD-10-CM | POA: Diagnosis not present

## 2016-01-09 DIAGNOSIS — I480 Paroxysmal atrial fibrillation: Secondary | ICD-10-CM | POA: Diagnosis not present

## 2016-01-09 DIAGNOSIS — M5136 Other intervertebral disc degeneration, lumbar region: Secondary | ICD-10-CM | POA: Diagnosis not present

## 2016-01-09 DIAGNOSIS — M150 Primary generalized (osteo)arthritis: Secondary | ICD-10-CM | POA: Diagnosis not present

## 2016-01-13 ENCOUNTER — Encounter: Payer: Self-pay | Admitting: Vascular Surgery

## 2016-01-20 ENCOUNTER — Encounter: Payer: Self-pay | Admitting: Vascular Surgery

## 2016-01-20 ENCOUNTER — Ambulatory Visit (INDEPENDENT_AMBULATORY_CARE_PROVIDER_SITE_OTHER): Payer: Medicare Other | Admitting: Vascular Surgery

## 2016-01-20 VITALS — BP 123/71 | HR 72 | Temp 97.6°F | Resp 16 | Ht 76.0 in | Wt 205.0 lb

## 2016-01-20 DIAGNOSIS — I83891 Varicose veins of right lower extremities with other complications: Secondary | ICD-10-CM

## 2016-01-20 NOTE — Progress Notes (Signed)
Subjective:     Patient ID: Wesley Chris., male   DOB: 07/09/1941, 75 y.o.   MRN: [id number]  HPI this 75 year old male had laser ablation of the right great saphenous vein performed under local tumescent anesthesia. A total of 1437 J of energy was utilized. He tolerated the procedure well.   Review of Systems     Objective:   Physical Exam BP 123/71 mmHg  Pulse 72  Temp(Src) 97.6 F (36.4 C)  Resp 16  Ht 6\' 4"  (1.93 m)  Wt 205 lb (92.987 kg)  BMI 24.96 kg/m2  SpO2 100%       Assessment:    well-tolerated laser ablation right great saphenous vein performed under local tumescent anesthesia for gross reflux with painful varicosities    Plan:     Return in 1 week for venous duplex exam to confirm closure right great saphenous vein Patient will then return in 3 months to be evaluated for multiple stab phlebectomy of large painful varicosities

## 2016-01-20 NOTE — Progress Notes (Signed)
Laser Ablation Procedure    Date: 01/20/2016   Wesley [name]. DOB:Oct 28, 1940  Consent signed: Yes    Surgeon:  Dr. Nelda Severe. Kellie Simmering  Procedure: Laser Ablation: right Greater Saphenous Vein  BP 123/71 mmHg  Pulse 72  Temp(Src) 97.6 F (36.4 C)  Resp 16  Ht 6\' 4"  (1.93 m)  Wt 205 lb (92.987 kg)  BMI 24.96 kg/m2  SpO2 100%  Tumescent Anesthesia: 300 cc 0.9% NaCl with 50 cc Lidocaine HCL with 1% Epi and 15 cc 8.4% NaHCO3  Local Anesthesia: 4 cc Lidocaine HCL and NaHCO3 (ratio 2:1)  Pulsed Mode: 15 watts, 548ms delay, 1.0 duration  Total Energy: 1439             Total Pulses:  96              Total Time:1:36     Patient tolerated procedure well  Notes:   Description of Procedure:  After marking the course of the secondary varicosities, the patient was placed on the operating table in the supine position, and the right leg was prepped and draped in sterile fashion.   Local anesthetic was administered and under ultrasound guidance the saphenous vein was accessed with a micro needle and guide wire; then the mirco puncture sheath was placed.  A guide wire was inserted saphenofemoral junction , followed by a 5 french sheath.  The position of the sheath and then the laser fiber below the junction was confirmed using the ultrasound.  Tumescent anesthesia was administered along the course of the saphenous vein using ultrasound guidance. The patient was placed in Trendelenburg position and protective laser glasses were placed on patient and staff, and the laser was fired at 15 watts continuous mode advancing 1-82mm/second for a total of 1439 joules.     Steri strips were applied to the stab wounds and ABD pads and thigh high compression stockings were applied.  Ace wrap bandages were applied over the phlebectomy sites and at the top of the saphenofemoral junction. Blood loss was less than 15 cc.  The patient ambulated out of the operating room having tolerated the procedure well.

## 2016-01-21 ENCOUNTER — Encounter: Payer: Self-pay | Admitting: Vascular Surgery

## 2016-01-21 ENCOUNTER — Telehealth: Payer: Self-pay | Admitting: *Deleted

## 2016-01-21 NOTE — Telephone Encounter (Signed)
Asked pt to call me if he has any questions or concerns.

## 2016-01-23 ENCOUNTER — Encounter: Payer: Self-pay | Admitting: Sports Medicine

## 2016-01-23 ENCOUNTER — Ambulatory Visit (INDEPENDENT_AMBULATORY_CARE_PROVIDER_SITE_OTHER): Payer: Medicare Other | Admitting: Sports Medicine

## 2016-01-23 VITALS — BP 131/100 | Ht 76.0 in | Wt 205.0 lb

## 2016-01-23 DIAGNOSIS — M75101 Unspecified rotator cuff tear or rupture of right shoulder, not specified as traumatic: Secondary | ICD-10-CM

## 2016-01-23 MED ORDER — NITROGLYCERIN 0.2 MG/HR TD PT24: MEDICATED_PATCH | TRANSDERMAL | Status: DC

## 2016-01-23 NOTE — Progress Notes (Signed)
Patient ID: Wesley Chris., male   DOB: 09-19-1940, 75 y.o.   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 Palpation is normal with no tenderness over AC joint or bicipital groove. There is a small deficit in the upper biceps on the right ROM is full in all planes. Rotator cuff strength normal throughout. +signs of impingement with -- Neer and Hawkin's tests, empty can. Speeds and Yergason's tests normal. No labral pathology noted with negative Obrien's, negative clunk and good stability. Mildy abnormal scapular function observed on the right only with slight winging No painful arc and no drop arm sign. No apprehension sign  Right shoulder ultrasound There is hypoechoic change from a remote rupture of the proximal biceps tendon This does not involve the complete tendon but about 50% Infraspinatus teres minor and subscapularis are normal Supraspinatous has about 20% of the anterior portion of the tendon retracted with increased hypoechoic fluid There is irregularity of the humeral head There is a subacromial  spur with increased Doppler flow and mild hypoechoic change in the soft tissue

## 2016-01-23 NOTE — Assessment & Plan Note (Signed)
Compared to his ultrasound exam in 2012 he now has small bicipital tendon tear and a small supraspinatous tear  We will give him a series of scapular stabilization exercises to reposition his scapula We will modify his exercises to work the rotator cuff and other muscles but not in a position above 90  Nitroglycerin protocol  Recheck in 6 weeks with repeat scan

## 2016-01-23 NOTE — Patient Instructions (Signed)

## 2016-01-27 ENCOUNTER — Ambulatory Visit (INDEPENDENT_AMBULATORY_CARE_PROVIDER_SITE_OTHER): Payer: Medicare Other | Admitting: Vascular Surgery

## 2016-01-27 ENCOUNTER — Ambulatory Visit: Payer: Medicare Other | Admitting: Vascular Surgery

## 2016-01-27 ENCOUNTER — Encounter: Payer: Self-pay | Admitting: Vascular Surgery

## 2016-01-27 ENCOUNTER — Ambulatory Visit (HOSPITAL_COMMUNITY)
Admission: RE | Admit: 2016-01-27 | Discharge: 2016-01-27 | Disposition: A | Discharge status: Home / Self Care | Payer: Medicare Other | Source: Ambulatory Visit | Attending: Vascular Surgery | Admitting: Vascular Surgery

## 2016-01-27 VITALS — BP 120/83 | HR 58 | Temp 97.6°F | Resp 16 | Ht 76.0 in | Wt 206.0 lb

## 2016-01-27 DIAGNOSIS — G4733 Obstructive sleep apnea (adult) (pediatric): Secondary | ICD-10-CM | POA: Insufficient documentation

## 2016-01-27 DIAGNOSIS — I83811 Varicose veins of right lower extremities with pain: Secondary | ICD-10-CM

## 2016-01-27 DIAGNOSIS — I83891 Varicose veins of right lower extremities with other complications: Secondary | ICD-10-CM | POA: Diagnosis not present

## 2016-01-27 DIAGNOSIS — K219 Gastro-esophageal reflux disease without esophagitis: Secondary | ICD-10-CM | POA: Diagnosis not present

## 2016-01-27 NOTE — Progress Notes (Signed)
Subjective:     Patient ID: Wesley Chris., male   DOB: 12/07/1940, 75 y.o.   MRN: [id number]  HPI this 75 year old male returns 1 week post-laser ablation right great saphenous vein for gross reflux with extensive painful bulging varicosities in the right leg. He has had some mild-to-moderate discomfort where the laser ablation was performed in the proximal thigh with some bruising. He's had no distal edema. He has noted that the bulges are somewhat less tense than previously. He has resumed his anticoagulation for A. fib- Eliquis.  Past Medical History  Diagnosis Date  . OSA (obstructive sleep apnea)     moderate OSA NPSG 10/24/09 - AHO 21.8/hr  . Ankylosing spondylitis ([place])   . A-fib [facility])     cardioversion x2  . MVP (mitral valve prolapse)     WITH A MIDSYSTOLIC CLICK  . Irregular heart beat   . Hemorrhoids   . Peripheral neuropathy ([place])   . GERD (gastroesophageal reflux disease)   . Vitamin B12 deficiency   . Atrial flutter [facility])     Social History  Substance Use Topics  . Smoking status: Former Smoker    Quit date: 07/27/1969  . Smokeless tobacco: Never Used  . Alcohol Use: Yes     Comment: 1-2 most days    Family History  Problem Relation Age of Onset  . Prostate cancer Father   . Arthritis Father   . Cancer Father   . Heart failure Mother   . Hypertension Mother     No Known Allergies   Current outpatient prescriptions:  .  Alpha-Lipoic Acid 600 MG CAPS, Take 600 mg by mouth daily., Disp: , Rfl:  .  apixaban (ELIQUIS) 5 MG TABS tablet, Take 1 tablet (5 mg total) by mouth 2 (two) times daily., Disp: 180 tablet, Rfl: 3 .  Ascorbic Acid (VITAMIN C) 1000 MG tablet, Take 1,000 mg by mouth daily.  , Disp: , Rfl:  .  Azelastine HCl 0.15 % SOLN, , Disp: , Rfl:  .  b complex vitamins capsule, Take 1 capsule by mouth daily.  , Disp: , Rfl:  .  cefdinir (OMNICEF) 300 MG capsule, , Disp: , Rfl:  .  Cyanocobalamin 5000 MCG SUBL, Place 1 tablet under the tongue daily. ,  Disp: , Rfl:  .  fluticasone (FLONASE) 50 MCG/ACT nasal spray, , Disp: , Rfl:  .  gabapentin (NEURONTIN) 300 MG capsule, Take 1 capsule (300 mg total) by mouth 3 (three) times daily., Disp: 270 capsule, Rfl: 2 .  gabapentin (NEURONTIN) 300 MG capsule, TAKE 1 CAPSULE THREE TIMES A DAY, Disp: 270 capsule, Rfl: 1 .  Loperamide-Simethicone 2-125 MG TABS, Take 1 tablet by mouth as needed. , Disp: , Rfl:  .  meloxicam (MOBIC) 15 MG tablet, Take 15 mg by mouth daily as needed for pain., Disp: , Rfl:  .  metoprolol succinate (TOPROL-XL) 25 MG 24 hr tablet, Take 1 tablet (25 mg total) by mouth daily., Disp: 90 tablet, Rfl: 3 .  Multiple Vitamin (MULTIVITAMIN) tablet, Take 1 tablet by mouth daily.  , Disp: , Rfl:  .  NEXIUM 40 MG capsule, TAKE 1 CAPSULE DAILY, Disp: 90 capsule, Rfl: 3 .  nitroGLYCERIN (NITRODUR - DOSED IN MG/24 HR) 0.2 mg/hr patch, Place 1/4 patch to affected area daily, Disp: 30 patch, Rfl: 1 .  pyridOXINE (VITAMIN B-6) 50 MG tablet, Take 50 mg by mouth 2 (two) times daily., Disp: , Rfl:   Filed Vitals:   01/27/16 1002  BP: 120/83  Pulse: 58  Temp: 97.6 F (36.4 C)  Resp: 16  Height: 6\' 4"  (1.93 m)  Weight: 206 lb (93.441 kg)  SpO2: 100%    Body mass index is 25.09 kg/(m^2).         Review of Systems denies chest pain, dyspnea on exertion, PND, orthopnea, hemoptysis, claudication. Does have chronic A. fib     Objective:   Physical Exam BP 120/83 mmHg  Pulse 58  Temp(Src) 97.6 F (36.4 C)  Resp 16  Ht 6\' 4"  (1.93 m)  Wt 206 lb (93.441 kg)  BMI 25.09 kg/m2  SpO2 100%  Gen. well-developed well-nourished male no apparent distress alert and oriented 3 Lungs no rhonchi or wheezing Right leg with moderate ecchymosis all medial thigh. Mild tenderness to deep palpation of great saphenous vein. Extensive bulging varicosities beginning in the distal thigh extending to the medial malleolus. No hyperpigmentation. 3+ dorsalis pedis pulse palpable.  Today I ordered a  venous duplex exam of the right leg which I reviewed and interpreted. There is no DVT. There is total closure of the great saphenous vein up to within 2 inches from the saphenofemoral junction     Assessment:     Successful laser ablation right great saphenous vein for gross reflux with extensive painful varicosities    Plan:     Return in 3 months to evaluate for possible stab phlebectomy-greater than 20-of painful varicosities right leg

## 2016-02-18 ENCOUNTER — Telehealth: Payer: Self-pay | Admitting: Cardiology

## 2016-02-18 ENCOUNTER — Encounter: Payer: Self-pay | Admitting: Cardiology

## 2016-02-24 NOTE — Telephone Encounter (Signed)
Closed encounter °

## 2016-02-27 DIAGNOSIS — E7800 Pure hypercholesterolemia, unspecified: Secondary | ICD-10-CM | POA: Diagnosis not present

## 2016-03-03 DIAGNOSIS — E7800 Pure hypercholesterolemia, unspecified: Secondary | ICD-10-CM | POA: Diagnosis not present

## 2016-03-03 DIAGNOSIS — Z1389 Encounter for screening for other disorder: Secondary | ICD-10-CM | POA: Diagnosis not present

## 2016-03-03 DIAGNOSIS — Z0000 Encounter for general adult medical examination without abnormal findings: Secondary | ICD-10-CM | POA: Diagnosis not present

## 2016-03-03 DIAGNOSIS — I480 Paroxysmal atrial fibrillation: Secondary | ICD-10-CM | POA: Diagnosis not present

## 2016-03-03 DIAGNOSIS — M459 Ankylosing spondylitis of unspecified sites in spine: Secondary | ICD-10-CM | POA: Diagnosis not present

## 2016-03-03 DIAGNOSIS — I272 Other secondary pulmonary hypertension: Secondary | ICD-10-CM | POA: Diagnosis not present

## 2016-03-03 DIAGNOSIS — I8390 Asymptomatic varicose veins of unspecified lower extremity: Secondary | ICD-10-CM | POA: Diagnosis not present

## 2016-03-03 DIAGNOSIS — G6289 Other specified polyneuropathies: Secondary | ICD-10-CM | POA: Diagnosis not present

## 2016-03-03 DIAGNOSIS — I341 Nonrheumatic mitral (valve) prolapse: Secondary | ICD-10-CM | POA: Diagnosis not present

## 2016-03-03 DIAGNOSIS — Z1212 Encounter for screening for malignant neoplasm of rectum: Secondary | ICD-10-CM | POA: Diagnosis not present

## 2016-03-03 DIAGNOSIS — M5416 Radiculopathy, lumbar region: Secondary | ICD-10-CM | POA: Diagnosis not present

## 2016-03-03 DIAGNOSIS — Z6825 Body mass index (BMI) 25.0-25.9, adult: Secondary | ICD-10-CM | POA: Diagnosis not present

## 2016-03-03 DIAGNOSIS — Z23 Encounter for immunization: Secondary | ICD-10-CM | POA: Diagnosis not present

## 2016-03-03 DIAGNOSIS — G4733 Obstructive sleep apnea (adult) (pediatric): Secondary | ICD-10-CM | POA: Diagnosis not present

## 2016-03-04 ENCOUNTER — Encounter: Payer: Self-pay | Admitting: Sports Medicine

## 2016-03-04 ENCOUNTER — Ambulatory Visit (INDEPENDENT_AMBULATORY_CARE_PROVIDER_SITE_OTHER): Payer: Medicare Other | Admitting: Sports Medicine

## 2016-03-04 DIAGNOSIS — M75101 Unspecified rotator cuff tear or rupture of right shoulder, not specified as traumatic: Secondary | ICD-10-CM

## 2016-03-04 NOTE — Assessment & Plan Note (Signed)
He is showing significant improvement Clinical exam shows much greater strength and less impingement Tear in supraspinatous but not in the biceps tendon seems to have resolved Continue home exercise program Continue nitroglycerin protocol Repeat scan in 6 weeks  If he has resolved almost all his pain at that time we will wean the nitroglycerin

## 2016-03-04 NOTE — Progress Notes (Signed)
CC: RC follow up - partial tear BT and SST right  Patient is now 6 weeks into a home exercise program He is using the nitroglycerin protocol without side effects Within 10 days he had less pain and now feels that his pain is 50% less than at the start He feels that his strength is better He has been able to do the exercises daily and brought his dumbbells to show me his technique Night pain seems to be going away  Review of systems No neck pain No radicular symptoms down his arm  Physical exam No acute distress BP 107/65   Pulse 62   Ht 6\' 3"  (1.905 m)   Wt 204 lb (92.5 kg)   BMI 25.50 kg/m   Shoulder: Inspection reveals no abnormalities, atrophy or asymmetry. Palpation is normal with no tenderness over AC joint or bicipital groove. ROM is full in all planes. Rotator cuff strength normal throughout.  While + on his last exam today no signs of impingement with negative Neer and Hawkin's tests, empty can. Speeds and Yergason's tests normal strength and only mild pain on Yergason No labral pathology noted with negative Obrien's, negative clunk and good stability. Normal scapular function observed. No painful arc and no drop arm sign. No apprehension sign  Ultrasound Bicipital tendon still shows a partial tear On appearance there are more tendon fibers and less swelling Small distal tear in the supraspinatous seems resolved There is some mild atrophy of just the anterior portion of the supraspinatous Normal Doppler activity Subscap infraspinatus and teres minor are normal

## 2016-04-03 ENCOUNTER — Ambulatory Visit: Payer: Medicare Other | Admitting: Cardiology

## 2016-04-07 DIAGNOSIS — Z23 Encounter for immunization: Secondary | ICD-10-CM | POA: Diagnosis not present

## 2016-04-14 ENCOUNTER — Ambulatory Visit: Payer: Medicare Other

## 2016-04-14 ENCOUNTER — Ambulatory Visit (INDEPENDENT_AMBULATORY_CARE_PROVIDER_SITE_OTHER): Payer: Medicare Other | Admitting: Sports Medicine

## 2016-04-14 ENCOUNTER — Encounter: Payer: Self-pay | Admitting: Sports Medicine

## 2016-04-14 VITALS — BP 95/83 | HR 75 | Ht 75.0 in | Wt 202.0 lb

## 2016-04-14 DIAGNOSIS — M25511 Pain in right shoulder: Secondary | ICD-10-CM | POA: Diagnosis not present

## 2016-04-14 DIAGNOSIS — M75101 Unspecified rotator cuff tear or rupture of right shoulder, not specified as traumatic: Secondary | ICD-10-CM

## 2016-04-14 NOTE — Progress Notes (Signed)
   [facility] [address] [place], [place] [address] Phone: [phone] Fax: [phone]   Patient Name: Wesley [name]. Date of Birth: [birth year] Medical Record Number: [id number] Gender: male Date of Encounter: 04/14/2016  History of Present Illness:  Wesley [name]. is a 75 year old man who presents with the following:  Right shoulder pain: he reports this as improved over the last 6 weeks. He has been intermittently using the nitro patch therapy - he uses it about half a week. He still does the exercises. He has not tried to play tennis. He notes that he had an episode of dizziness and nausea last week. He had a similar episode 2-3 years ago that improved with reducing the strength of his beta blocker.   HEP he feels has improved his strength.  No pain with most overhead activity.   Review of Systems: No fevers, chills, night sweats, weight loss, chest pain, or shortness of breath. No night pain in shoulder  Physical Examination: Blood pressure 95/83, pulse 75, height 6\' 3"  (1.905 m), weight 202 lb (91.6 kg).  Body mass index is 25.25 kg/m.  General: No acute distress  Shoulder: Inspection reveals no abnormalities, atrophy or asymmetry. Tenderness with palpation over greater tubercle. ROM is full in all planes but pain with flexion around 90-120 degrees Rotator cuff strength normal throughout. No signs of impingement with negative empty can. Speeds and Yergason's tests normal with mild pain on Speeds Normal scapular function observed. No painful arc  Ultrasound of Shoulder-RT  BT short-mild target sign BT long-Hypoechoic change on both sides of tendon with smaller area of splitting Supraspinatus tendon-better volume with less atrophy/ tear has resolved Subscapularis tendon-normal but some humeral head irregularity Infraspinatus tendon-NL Teres Minor tendon-NL AC joint - mild degenerative change with narrowing but no  fluid  Summary and Additional findings- compared to earlier [place] his supraspinatous tear appears healed; biceps tendon is partially torn and some decrease in swelling but minimal change.  Mild DJD of AC joint.   Assessment and Plan: Right rotator cuff syndrome: Improvement symptomatically with good strength. Encouraged patient to continue home exercise program. Discontinue nitroglycerin patch protocol as this may be causing his low normal blood pressure and dizziness. Follow up as needed.  Jacques Earthly, MD  Internal Medicine PGY-3  I observed and examined the patient with the resident and agree with assessment and plan.  Note reviewed and modified by me. Stefanie Libel, MD

## 2016-04-14 NOTE — Assessment & Plan Note (Signed)
This has improved a lot from last visit.  OK to stop NTG  Keep up HEP 3x per week  Ease into tennis specific dumbell activity before playing

## 2016-04-24 DIAGNOSIS — L821 Other seborrheic keratosis: Secondary | ICD-10-CM | POA: Diagnosis not present

## 2016-04-24 DIAGNOSIS — L720 Epidermal cyst: Secondary | ICD-10-CM | POA: Diagnosis not present

## 2016-04-24 DIAGNOSIS — L814 Other melanin hyperpigmentation: Secondary | ICD-10-CM | POA: Diagnosis not present

## 2016-04-24 DIAGNOSIS — L820 Inflamed seborrheic keratosis: Secondary | ICD-10-CM | POA: Diagnosis not present

## 2016-04-24 DIAGNOSIS — D225 Melanocytic nevi of trunk: Secondary | ICD-10-CM | POA: Diagnosis not present

## 2016-04-27 NOTE — Progress Notes (Signed)
HPI: Follow-up atrial fibrillation. Nuclear study March 2006 was normal. Had successful ablation of his atrial fibrillation on 02/19/10 in [place]. Patient continues to be followed there. He has had intermittent infrequent episodes of paroxysmal atrial fibrillation since his ablation treated medically. Abdominal ultrasound March 2017 showed no aneurysm. Echocardiogram April 2017 showed normal LV function, mildly dilated aortic root, mild mitral regurgitation, mild to moderate right ventricular enlargement and moderate tricuspid regurgitation. Since last seen, he has had occasional dizziness and heart skipping. He denies dyspnea on exertion, orthopnea, PND, pedal edema, chest pain or syncope.  Current Outpatient Prescriptions  Medication Sig Dispense Refill  . Alpha-Lipoic Acid 600 MG CAPS Take 600 mg by mouth daily.    Marland Kitchen apixaban (ELIQUIS) 5 MG TABS tablet Take 1 tablet (5 mg total) by mouth 2 (two) times daily. 180 tablet 3  . Ascorbic Acid (VITAMIN C) 1000 MG tablet Take 1,000 mg by mouth daily.      . Azelastine HCl 0.15 % SOLN     . b complex vitamins capsule Take 1 capsule by mouth daily.      . cefdinir (OMNICEF) 300 MG capsule     . Cyanocobalamin 5000 MCG SUBL Place 1 tablet under the tongue daily.     . fluticasone (FLONASE) 50 MCG/ACT nasal spray     . gabapentin (NEURONTIN) 300 MG capsule TAKE 1 CAPSULE THREE TIMES A DAY 270 capsule 1  . Loperamide-Simethicone 2-125 MG TABS Take 1 tablet by mouth as needed.     . meloxicam (MOBIC) 15 MG tablet Take 15 mg by mouth daily as needed for pain.    . metoprolol succinate (TOPROL-XL) 25 MG 24 hr tablet Take 1 tablet (25 mg total) by mouth daily. 90 tablet 3  . Multiple Vitamin (MULTIVITAMIN) tablet Take 1 tablet by mouth daily.      Marland Kitchen NEXIUM 40 MG capsule TAKE 1 CAPSULE DAILY 90 capsule 3  . pyridOXINE (VITAMIN B-6) 50 MG tablet Take 50 mg by mouth 2 (two) times daily.     No current facility-administered medications  for this visit.      Past Medical History:  Diagnosis Date  . A-fib [facility])    cardioversion x2  . Ankylosing spondylitis ([place])   . Atrial flutter ([place])   . GERD (gastroesophageal reflux disease)   . Hemorrhoids   . Irregular heart beat   . MVP (mitral valve prolapse)    WITH A MIDSYSTOLIC CLICK  . OSA (obstructive sleep apnea)    moderate OSA NPSG 10/24/09 - AHO 21.8/hr  . Peripheral neuropathy ([place])   . Vitamin B12 deficiency     Past Surgical History:  Procedure Laterality Date  . ABLATION    . COLONOSCOPY    . INGUINAL HERNIA REPAIR    . venous ligation     for varices left lower leg    Social History   Social History  . Marital status: Married    Spouse name: N/A  . Number of children: 2  . Years of education: 48   Occupational History  . sales   .  [employer]   Social History Main Topics  . Smoking status: Former Smoker    Quit date: 07/27/1969  . Smokeless tobacco: Never Used  . Alcohol use Yes     Comment: 1-2 most days  . Drug use: Unknown  . Sexual activity: Not on file   Other Topics Concern  . Not on file  Social History Narrative  . No narrative on file    Family History  Problem Relation Age of Onset  . Prostate cancer Father   . Arthritis Father   . Cancer Father   . Heart failure Mother   . Hypertension Mother     ROS: no fevers or chills, productive cough, hemoptysis, dysphasia, odynophagia, melena, hematochezia, dysuria, hematuria, rash, seizure activity, orthopnea, PND, pedal edema, claudication. Remaining systems are negative.  Physical Exam: Well-developed well-nourished in no acute distress.  Skin is warm and dry.  HEENT is normal.  Neck is supple.  Chest is clear to auscultation with normal expansion.  Cardiovascular exam is regular rate and rhythm.  Abdominal exam nontender or distended. No masses palpated. Extremities show no edema. neuro grossly intact  ECG Sinus rhythm at a rate of 75. Left anterior  fascicular block. RV conduction delay. No ST changes.  A/P  1 Paroxysmal atrial fibrillation-continue apixaban. Hemoglobin and renal function monitored by primary care. He is having some dizziness and his blood pressure is running low. Recheck was 94/60. Decrease Toprol to 12.5 mg daily at bedtime. He is also followed in [place].  2 palpitations-decrease Toprol as outlined above.  3 mitral valve prolapse-mild mitral regurgitation on most recent echo.  Kirk Ruths, MD

## 2016-04-28 ENCOUNTER — Ambulatory Visit: Payer: Medicare Other | Admitting: Cardiology

## 2016-04-28 ENCOUNTER — Ambulatory Visit: Payer: Medicare Other | Admitting: Vascular Surgery

## 2016-05-01 ENCOUNTER — Encounter: Payer: Self-pay | Admitting: Vascular Surgery

## 2016-05-01 ENCOUNTER — Encounter: Payer: Self-pay | Admitting: Cardiology

## 2016-05-01 ENCOUNTER — Ambulatory Visit (INDEPENDENT_AMBULATORY_CARE_PROVIDER_SITE_OTHER): Payer: Medicare Other | Admitting: Cardiology

## 2016-05-01 VITALS — BP 84/60 | HR 75 | Ht 75.0 in | Wt 206.0 lb

## 2016-05-01 DIAGNOSIS — I480 Paroxysmal atrial fibrillation: Secondary | ICD-10-CM | POA: Diagnosis not present

## 2016-05-01 DIAGNOSIS — I4891 Unspecified atrial fibrillation: Secondary | ICD-10-CM

## 2016-05-01 DIAGNOSIS — I341 Nonrheumatic mitral (valve) prolapse: Secondary | ICD-10-CM | POA: Diagnosis not present

## 2016-05-01 MED ORDER — METOPROLOL SUCCINATE ER 25 MG PO TB24: 12.5000 mg | ORAL_TABLET | Freq: Every day | ORAL | 3 refills | Status: DC

## 2016-05-01 NOTE — Patient Instructions (Signed)
Medication Instructions:   DECREASE METOPROLOL TO 12.5 MG DAILY AT BEDTIME= 1/2 OF THE 25 MG TABLET ONCE DAILY AT BEDTIME  Follow-Up:  Your physician wants you to follow-up in: [place] will receive a reminder letter in the mail two months in advance. If you don't receive a letter, please call our office to schedule the follow-up appointment.   If you need a refill on your cardiac medications before your next appointment, please call your pharmacy.

## 2016-05-05 ENCOUNTER — Ambulatory Visit: Payer: Medicare Other | Admitting: Sports Medicine

## 2016-05-05 ENCOUNTER — Ambulatory Visit (INDEPENDENT_AMBULATORY_CARE_PROVIDER_SITE_OTHER): Payer: Medicare Other | Admitting: Vascular Surgery

## 2016-05-05 ENCOUNTER — Encounter: Payer: Self-pay | Admitting: Vascular Surgery

## 2016-05-05 VITALS — BP 125/75 | HR 81 | Temp 98.0°F | Resp 16 | Ht 75.0 in | Wt 205.0 lb

## 2016-05-05 DIAGNOSIS — I83891 Varicose veins of right lower extremities with other complications: Secondary | ICD-10-CM

## 2016-05-05 NOTE — Progress Notes (Signed)
Subjective:     Patient ID: Wesley Chris., male   DOB: 1940-12-29, 75 y.o.   MRN: [id number]  HPI This 75 year old male returns 3 months post-laser ablation right great saphenous vein for painful varicosities due to gross reflux in the right great saphenous system. He has had less discomfort continues to have some aching and throbbing and these large bulbous varicosities in his right leg in the thigh and calf area. He also continues to have some swelling. This is not relieved by long leg elastic compression stockings 20-30 millimeter gradient.  Past Medical History:  Diagnosis Date  . A-fib [facility])    cardioversion x2  . Ankylosing spondylitis ([place])   . Atrial flutter ([place])   . GERD (gastroesophageal reflux disease)   . Hemorrhoids   . Irregular heart beat   . MVP (mitral valve prolapse)    WITH A MIDSYSTOLIC CLICK  . OSA (obstructive sleep apnea)    moderate OSA NPSG 10/24/09 - AHO 21.8/hr  . Peripheral neuropathy ([place])   . Vitamin B12 deficiency     Social History  Substance Use Topics  . Smoking status: Former Smoker    Quit date: 07/27/1969  . Smokeless tobacco: Never Used  . Alcohol use Yes     Comment: 1-2 most days    Family History  Problem Relation Age of Onset  . Prostate cancer Father   . Arthritis Father   . Cancer Father   . Heart failure Mother   . Hypertension Mother     No Known Allergies   Current Outpatient Prescriptions:  .  Alpha-Lipoic Acid 600 MG CAPS, Take 600 mg by mouth daily., Disp: , Rfl:  .  apixaban (ELIQUIS) 5 MG TABS tablet, Take 1 tablet (5 mg total) by mouth 2 (two) times daily., Disp: 180 tablet, Rfl: 3 .  Ascorbic Acid (VITAMIN C) 1000 MG tablet, Take 1,000 mg by mouth daily.  , Disp: , Rfl:  .  Azelastine HCl 0.15 % SOLN, , Disp: , Rfl:  .  b complex vitamins capsule, Take 1 capsule by mouth daily.  , Disp: , Rfl:  .  cefdinir (OMNICEF) 300 MG capsule, , Disp: , Rfl:  .  Cyanocobalamin 5000 MCG SUBL, Place 1 tablet under the tongue  daily. , Disp: , Rfl:  .  fluticasone (FLONASE) 50 MCG/ACT nasal spray, , Disp: , Rfl:  .  gabapentin (NEURONTIN) 300 MG capsule, TAKE 1 CAPSULE THREE TIMES A DAY, Disp: 270 capsule, Rfl: 1 .  Loperamide-Simethicone 2-125 MG TABS, Take 1 tablet by mouth as needed. , Disp: , Rfl:  .  meloxicam (MOBIC) 15 MG tablet, Take 15 mg by mouth daily as needed for pain., Disp: , Rfl:  .  metoprolol succinate (TOPROL-XL) 25 MG 24 hr tablet, Take 0.5 tablets (12.5 mg total) by mouth daily., Disp: 90 tablet, Rfl: 3 .  Multiple Vitamin (MULTIVITAMIN) tablet, Take 1 tablet by mouth daily.  , Disp: , Rfl:  .  NEXIUM 40 MG capsule, TAKE 1 CAPSULE DAILY, Disp: 90 capsule, Rfl: 3 .  pyridOXINE (VITAMIN B-6) 50 MG tablet, Take 50 mg by mouth 2 (two) times daily., Disp: , Rfl:   Vitals:   05/05/16 1501  BP: 125/75  Pulse: 81  Resp: 16  Temp: 98 F (36.7 C)  SpO2: 100%  Weight: 205 lb (93 kg)  Height: 6\' 3"  (1.905 m)    Body mass index is 25.62 kg/m.         Review of  Systems Denies chest pain, dyspnea on exertion, PND, orthopnea, hemoptysis. Also does have history of A. fib and is onXeralto    Objective:   Physical Exam BP 125/75   Pulse 81   Temp 98 F (36.7 C)   Resp 16   Ht 6\' 3"  (1.905 m)   Wt 205 lb (93 kg)   SpO2 100%   BMI 25.62 kg/m   Gen. well-developed well-nourished male no apparent distress alert and oriented 3 Lungs no rhonchi or wheezing Right leg with extensive bulging varicosities in the medial distal thigh and medial calf down to the medial malleolus with 1+ edema. No ulceration noted. 3+ dorsalis pedis pulse palpable.     Assessment:     Painful varicosities right leg post successful laser ablation right great saphenous vein-extensive bulging varicosities which remain symptomatic    Plan:     Patient needs greater than 20 stab phlebectomy of painful varicosities to complete his treatment regimen We'll proceed with precertification to perform this in the near  future Will discontinue his anticoagulation 5 days prior to the procedure and restart it 1 day following the procedure

## 2016-05-15 ENCOUNTER — Encounter: Payer: Self-pay | Admitting: Vascular Surgery

## 2016-05-19 ENCOUNTER — Encounter: Payer: Self-pay | Admitting: Vascular Surgery

## 2016-05-19 ENCOUNTER — Ambulatory Visit (INDEPENDENT_AMBULATORY_CARE_PROVIDER_SITE_OTHER): Payer: Medicare Other | Admitting: Vascular Surgery

## 2016-05-19 VITALS — BP 117/73 | HR 72 | Temp 97.6°F | Resp 16 | Ht 75.0 in | Wt 205.0 lb

## 2016-05-19 DIAGNOSIS — I83891 Varicose veins of right lower extremities with other complications: Secondary | ICD-10-CM

## 2016-05-19 NOTE — Progress Notes (Signed)
Subjective:     Patient ID: Wesley Chris., male   DOB: 04/06/1941, 75 y.o.   MRN: [id number]  HPI This 75 year old male had multiple stab phlebectomy-greater than 30-of painful varicosities in the right leg performed under local tumescent anesthesia. He tolerated the procedure well.  Review of Systems     Objective:   Physical Exam BP 117/73   Pulse 72   Temp 97.6 F (36.4 C)   Resp 16   Ht 6\' 3"  (1.905 m)   Wt 205 lb (93 kg)   SpO2 100%   BMI 25.62 kg/m       Assessment:     Multiple stab phlebectomy of painful varicosities right leg-greater than 30-performed under local tumescent anesthesia    Plan:     Return in 3 months for final follow-up

## 2016-05-19 NOTE — Progress Notes (Signed)
    Stab Phlebectomy Procedure  Wesley [name]. DOB:August 12, 1940  05/19/2016  Consent signed: Yes  Surgeon:J.D. Kellie Simmering  Procedure: stab phlebectomy: right leg  BP 117/73   Pulse 72   Temp 97.6 F (36.4 C)   Resp 16   Ht 6\' 3"  (1.905 m)   Wt 205 lb (93 kg)   SpO2 100%   BMI 25.62 kg/m   Start time: 1pm   End time: 2:10pm   Tumescent Anesthesia: 350 cc 0.9% NaCl with 50 cc Lidocaine HCL with 1% Epi and 15 cc 8.4% NaHCO3  Local Anesthesia: 7 cc Lidocaine HCL and NaHCO3 (ratio 2:1)    Stab Phlebectomy: >20 Sites: Thigh and Calf  Patient tolerated procedure well: Yes  Notes:   Description of Procedure:  After marking the course of the secondary varicosities, the patient was placed on the operating table in the supine position, and the right leg was prepped and draped in sterile fashion.    The patient was then put into Trendelenburg position.  Local anesthetic was administered at the previously marked varicosities, and tumescent anesthesia was administered around the vessels.  Greater than 20 stab wounds were made using the tip of an 11 blade. And using the vein hook, the phlebectomies were performed using a hemostat to avulse the varicosities.  Adequate hemostasis was achieved, and steri strips were applied to the stab wound.      ABD pads and thigh high compression stockings were applied as well ace wraps where needed. Blood loss was less than 15 cc.  The patient ambulated out of the operating room having tolerated the procedure well.

## 2016-05-20 ENCOUNTER — Encounter: Payer: Self-pay | Admitting: Vascular Surgery

## 2016-05-26 ENCOUNTER — Encounter: Payer: Self-pay | Admitting: Vascular Surgery

## 2016-06-01 ENCOUNTER — Ambulatory Visit: Payer: Medicare Other | Admitting: Vascular Surgery

## 2016-07-07 ENCOUNTER — Other Ambulatory Visit: Payer: Self-pay

## 2016-07-07 ENCOUNTER — Other Ambulatory Visit: Payer: Self-pay | Admitting: *Deleted

## 2016-07-07 ENCOUNTER — Other Ambulatory Visit: Payer: Self-pay | Admitting: Sports Medicine

## 2016-07-07 DIAGNOSIS — I4891 Unspecified atrial fibrillation: Secondary | ICD-10-CM

## 2016-07-07 MED ORDER — METOPROLOL SUCCINATE ER 25 MG PO TB24: 12.5000 mg | ORAL_TABLET | Freq: Every day | ORAL | 3 refills | Status: DC

## 2016-07-07 MED ORDER — APIXABAN 5 MG PO TABS: 5.0000 mg | ORAL_TABLET | Freq: Two times a day (BID) | ORAL | 3 refills | Status: DC

## 2016-07-09 DIAGNOSIS — M5136 Other intervertebral disc degeneration, lumbar region: Secondary | ICD-10-CM | POA: Diagnosis not present

## 2016-07-09 DIAGNOSIS — Z6826 Body mass index (BMI) 26.0-26.9, adult: Secondary | ICD-10-CM | POA: Diagnosis not present

## 2016-07-09 DIAGNOSIS — M150 Primary generalized (osteo)arthritis: Secondary | ICD-10-CM | POA: Diagnosis not present

## 2016-07-09 DIAGNOSIS — I480 Paroxysmal atrial fibrillation: Secondary | ICD-10-CM | POA: Diagnosis not present

## 2016-07-09 DIAGNOSIS — M450 Ankylosing spondylitis of multiple sites in spine: Secondary | ICD-10-CM | POA: Diagnosis not present

## 2016-07-09 DIAGNOSIS — E663 Overweight: Secondary | ICD-10-CM | POA: Diagnosis not present

## 2016-07-22 ENCOUNTER — Other Ambulatory Visit: Payer: Self-pay | Admitting: *Deleted

## 2016-07-22 DIAGNOSIS — I4891 Unspecified atrial fibrillation: Secondary | ICD-10-CM

## 2016-07-22 MED ORDER — METOPROLOL SUCCINATE ER 25 MG PO TB24: 25.0000 mg | ORAL_TABLET | Freq: Every day | ORAL | 3 refills | Status: DC

## 2016-07-22 NOTE — Telephone Encounter (Signed)
AVS Reports   Date/Time Report Action User  05/01/2016 9:23 AM After Visit Summary Printed Cristopher Estimable, RN  Patient Instructions   Medication Instructions:   DECREASE METOPROLOL TO 12.5 MG DAILY AT BEDTIME= 1/2 OF THE 25 MG TABLET ONCE DAILY AT BEDTIME

## 2016-07-22 NOTE — Telephone Encounter (Signed)
Spoke with pt, he repots since that day he was seen he started taking the metoprolol in the evening and has not had anymore problems with dizziness or low blood pressure. Patient will continue with 25 mg once daily. Refill sent to the pharmacy electronically.

## 2016-08-18 ENCOUNTER — Encounter: Payer: Self-pay | Admitting: Vascular Surgery

## 2016-08-20 ENCOUNTER — Ambulatory Visit (INDEPENDENT_AMBULATORY_CARE_PROVIDER_SITE_OTHER): Payer: Medicare Other | Admitting: Sports Medicine

## 2016-08-20 ENCOUNTER — Encounter: Payer: Self-pay | Admitting: Sports Medicine

## 2016-08-20 DIAGNOSIS — M25572 Pain in left ankle and joints of left foot: Secondary | ICD-10-CM

## 2016-08-20 DIAGNOSIS — M21612 Bunion of left foot: Secondary | ICD-10-CM

## 2016-08-20 DIAGNOSIS — M21611 Bunion of right foot: Secondary | ICD-10-CM

## 2016-08-20 NOTE — Assessment & Plan Note (Signed)
Sports insoles MT pads placed Scaphoid pads  After making these corrections patient could walk with less pain Has a broad based gait  Trial for 1 month Then consider custom orthotics

## 2016-08-20 NOTE — Progress Notes (Signed)
Left foot pain  Patient works our regularly Feeling pain on left forefoot Has bilateral bunions with some occassional pain No bunion pain today No plantar surface pain at the heel No AT pain Pain on left is worse after 1 to 2 miles walking  Past Hx Peripheral neuropahty afftecting both lower legs Rotator cuff tendinopathy that has responded to Tx Reviewed other problems  ROS Some numbness in both feet No swelling No night pain  PE Tall thin M in NAD BP 121/73   Pulse 67   Ht 6\' 3"  (1.905 m)   Wt 205 lb (93 kg)   BMI 25.62 kg/m   Left foot bunion with moderate valgus displacement This has increased No TTP at MTP 1 Has preserved motion TTP at MTP 2 and 3 on plantar surface No Morotn's Callus noted Pressure pattern on shoes is on forefoot Loss of transverse arch High long arch  RT foot  Bunion with mild valgus shift Early hammertoe changes Loss of transverse arch

## 2016-08-20 NOTE — Assessment & Plan Note (Signed)
Convert to orthotics after trial on insoles  Add additional metatarsal padding as sxs today are metatarsalgia

## 2016-08-25 ENCOUNTER — Ambulatory Visit (INDEPENDENT_AMBULATORY_CARE_PROVIDER_SITE_OTHER): Payer: Medicare Other | Admitting: Vascular Surgery

## 2016-08-25 ENCOUNTER — Encounter: Payer: Self-pay | Admitting: Vascular Surgery

## 2016-08-25 VITALS — BP 103/68 | HR 70 | Temp 97.1°F | Resp 18 | Ht 75.0 in | Wt 206.0 lb

## 2016-08-25 DIAGNOSIS — I83891 Varicose veins of right lower extremities with other complications: Secondary | ICD-10-CM

## 2016-08-25 NOTE — Progress Notes (Signed)
Subjective:     Patient ID: Wesley Chris., male   DOB: 16-Jan-1941, 76 y.o.   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 He initially underwent laser ablation of the right great saphenous vein and 3 months ago had multiple stab phlebectomy-greater than 30-of painful varicosities in the right leg.He has no specific complaints. He denies any pain in the right leg. He has had no distal edema. He is not wearing elastic compression stockings on a regular basis. He is on chronic anticoagulation with Eliquis.  Past Medical History:  Diagnosis Date  . A-fib [facility])    cardioversion x2  . Ankylosing spondylitis ([place])   . Atrial flutter ([place])   . GERD (gastroesophageal reflux disease)   . Hemorrhoids   . Irregular heart beat   . MVP (mitral valve prolapse)    WITH A MIDSYSTOLIC CLICK  . OSA (obstructive sleep apnea)    moderate OSA NPSG 10/24/09 - AHO 21.8/hr  . Peripheral neuropathy ([place])   . Vitamin B12 deficiency     Social History  Substance Use Topics  . Smoking status: Former Smoker    Quit date: 07/27/1969  . Smokeless tobacco: Never Used  . Alcohol use Yes     Comment: 1-2 most days    Family History  Problem Relation Age of Onset  . Prostate cancer Father   . Arthritis Father   . Cancer Father   . Heart failure Mother   . Hypertension Mother     No Known Allergies   Current Outpatient Prescriptions:  .  Alpha-Lipoic Acid 600 MG CAPS, Take 600 mg by mouth daily., Disp: , Rfl:  .  apixaban (ELIQUIS) 5 MG TABS tablet, Take 1 tablet (5 mg total) by mouth 2 (two) times daily., Disp: 180 tablet, Rfl: 3 .  Ascorbic Acid (VITAMIN C) 1000 MG tablet, Take 1,000 mg by mouth daily.  , Disp: , Rfl:  .  Azelastine HCl 0.15 % SOLN, , Disp: , Rfl:  .  b complex vitamins capsule, Take 1 capsule by mouth daily.  , Disp: , Rfl:  .  cefdinir (OMNICEF) 300 MG capsule, , Disp: , Rfl:  .   Cyanocobalamin 5000 MCG SUBL, Place 1 tablet under the tongue daily. , Disp: , Rfl:  .  fluticasone (FLONASE) 50 MCG/ACT nasal spray, , Disp: , Rfl:  .  gabapentin (NEURONTIN) 300 MG capsule, TAKE 1 CAPSULE THREE TIMES A DAY, Disp: 270 capsule, Rfl: 1 .  Loperamide-Simethicone 2-125 MG TABS, Take 1 tablet by mouth as needed. , Disp: , Rfl:  .  meloxicam (MOBIC) 15 MG tablet, Take 15 mg by mouth daily as needed for pain., Disp: , Rfl:  .  metoprolol succinate (TOPROL-XL) 25 MG 24 hr tablet, Take 1 tablet (25 mg total) by mouth daily., Disp: 90 tablet, Rfl: 3 .  Multiple Vitamin (MULTIVITAMIN) tablet, Take 1 tablet by mouth daily.  , Disp: , Rfl:  .  NEXIUM 40 MG capsule, TAKE 1 CAPSULE DAILY, Disp: 90 capsule, Rfl: 3 .  pyridOXINE (VITAMIN B-6) 50 MG tablet, Take 50 mg by mouth 2 (two) times daily., Disp: , Rfl:   Vitals:   08/25/16 1106  BP: 103/68  Pulse: 70  Resp: 18  Temp: 97.1 F (36.2 C)  SpO2: 93%  Weight: 206 lb (93.4 kg)  Height: 6\' 3"  (1.905 m)    Body mass index is 25.75 kg/m.  Review of Systems Denies chest pain, dyspnea on exertion, PND, orthopnea, hemoptysis, claudication    Objective:   Physical Exam BP 103/68 (BP Location: Left Arm, Patient Position: Sitting, Cuff Size: Large)   Pulse 70   Temp 97.1 F (36.2 C)   Resp 18   Ht 6\' 3"  (1.905 m)   Wt 206 lb (93.4 kg)   SpO2 93%   BMI 25.75 kg/m   Gen. well-developed well-nourished male no apparent distress alert and oriented 3 Lungs no rhonchi or wheezing Cardiovascular regular rhythm no murmurs Right leg has a few small varicosities in the posterior calf and distal medial thigh but otherwise free of varicosities. No hyperpigmentation or ulceration noted. 3+ dorsalis pedis pulse palpable.     Assessment:     Status post laser ablation right great saphenous vein followed by multiple stab phlebectomy of painful varicosities right leg 3 months later with excellent result-no specific complaints     Plan:     Return seeing on a when necessary basis Recommend wearing short leg elastic compression stockings on flights

## 2016-08-31 DIAGNOSIS — R52 Pain, unspecified: Secondary | ICD-10-CM | POA: Diagnosis not present

## 2016-09-23 DIAGNOSIS — H40013 Open angle with borderline findings, low risk, bilateral: Secondary | ICD-10-CM | POA: Diagnosis not present

## 2016-10-15 ENCOUNTER — Ambulatory Visit (INDEPENDENT_AMBULATORY_CARE_PROVIDER_SITE_OTHER): Payer: Medicare Other | Admitting: Sports Medicine

## 2016-10-15 ENCOUNTER — Encounter: Payer: Self-pay | Admitting: Sports Medicine

## 2016-10-15 DIAGNOSIS — M25572 Pain in left ankle and joints of left foot: Secondary | ICD-10-CM | POA: Diagnosis not present

## 2016-10-15 DIAGNOSIS — G603 Idiopathic progressive neuropathy: Secondary | ICD-10-CM | POA: Diagnosis not present

## 2016-10-15 MED ORDER — PREGABALIN 75 MG PO CAPS: 75.0000 mg | ORAL_CAPSULE | Freq: Two times a day (BID) | ORAL | 3 refills | Status: DC

## 2016-10-15 NOTE — Assessment & Plan Note (Signed)
Sxs are better on gapapentin 300 tid If he misses a dose more foot tingling and burning  Would like to try lyrica and see if he gets more relief  Start lyrica at 77 bid Follow up after 1 month Can consider increasing dose

## 2016-10-15 NOTE — Progress Notes (Signed)
Foot pain  Known neuropathy Left foot becoming more painful We tries sports insole with scaphoid and MT pad MT pain did resolve but sports insoles placed more pressure on medail and lat forefoot  Comes for reassessment Likes to work out and walk This is limiting  Past Hx Known peripheral neuropathy on gabapentin Varicose veins Bunion - primarily left foot  ROS Balance issues and lands on feet inverted at times Some swelling in lower legs No ankle pain  PE Thin Male in NAD BP 109/67   Pulse 73   Ht 6\' 3"  (1.905 m)   Wt 205 lb (93 kg)   BMI 25.62 kg/m   bilat feet show high cavus shape Some loss of arch height with standing Bunion on left has 20 deg valgus deviation More MTP hypertrophy on RT with 10 deg deviation  No calluses on forefoot Mild TTP at distal MT 2 left  Sensation is decreased to lifght touch distally

## 2016-10-15 NOTE — Assessment & Plan Note (Signed)
I think this is worsened by peripheral neuropathy  Still needs structural support  Try sports insoles with no scaphoid Trial with MT pad on left only Gati is partially corrected with this but still mild pronation  If these not helpful switch to Spenco or considter custom orthotic Test x 1 month

## 2016-11-06 DIAGNOSIS — R351 Nocturia: Secondary | ICD-10-CM | POA: Diagnosis not present

## 2016-11-06 DIAGNOSIS — N401 Enlarged prostate with lower urinary tract symptoms: Secondary | ICD-10-CM | POA: Diagnosis not present

## 2016-11-06 DIAGNOSIS — N5201 Erectile dysfunction due to arterial insufficiency: Secondary | ICD-10-CM | POA: Diagnosis not present

## 2016-11-06 DIAGNOSIS — R350 Frequency of micturition: Secondary | ICD-10-CM | POA: Diagnosis not present

## 2016-11-24 DIAGNOSIS — Z7901 Long term (current) use of anticoagulants: Secondary | ICD-10-CM | POA: Diagnosis not present

## 2016-11-24 DIAGNOSIS — Z9889 Other specified postprocedural states: Secondary | ICD-10-CM | POA: Diagnosis not present

## 2016-11-24 DIAGNOSIS — I481 Persistent atrial fibrillation: Secondary | ICD-10-CM | POA: Diagnosis not present

## 2016-11-24 DIAGNOSIS — R002 Palpitations: Secondary | ICD-10-CM | POA: Diagnosis not present

## 2016-11-24 DIAGNOSIS — Z8679 Personal history of other diseases of the circulatory system: Secondary | ICD-10-CM | POA: Diagnosis not present

## 2016-12-31 ENCOUNTER — Other Ambulatory Visit: Payer: Self-pay | Admitting: Sports Medicine

## 2016-12-31 ENCOUNTER — Other Ambulatory Visit: Payer: Self-pay | Admitting: *Deleted

## 2016-12-31 MED ORDER — GABAPENTIN 300 MG PO CAPS: 300.0000 mg | ORAL_CAPSULE | Freq: Three times a day (TID) | ORAL | 1 refills | Status: DC

## 2017-01-07 DIAGNOSIS — Z6826 Body mass index (BMI) 26.0-26.9, adult: Secondary | ICD-10-CM | POA: Diagnosis not present

## 2017-01-07 DIAGNOSIS — I480 Paroxysmal atrial fibrillation: Secondary | ICD-10-CM | POA: Diagnosis not present

## 2017-01-07 DIAGNOSIS — M5136 Other intervertebral disc degeneration, lumbar region: Secondary | ICD-10-CM | POA: Diagnosis not present

## 2017-01-07 DIAGNOSIS — M450 Ankylosing spondylitis of multiple sites in spine: Secondary | ICD-10-CM | POA: Diagnosis not present

## 2017-01-07 DIAGNOSIS — M150 Primary generalized (osteo)arthritis: Secondary | ICD-10-CM | POA: Diagnosis not present

## 2017-01-14 ENCOUNTER — Other Ambulatory Visit: Payer: Self-pay | Admitting: *Deleted

## 2017-01-14 MED ORDER — GABAPENTIN 300 MG PO CAPS: 300.0000 mg | ORAL_CAPSULE | Freq: Three times a day (TID) | ORAL | 1 refills | Status: DC

## 2017-01-20 DIAGNOSIS — M19011 Primary osteoarthritis, right shoulder: Secondary | ICD-10-CM | POA: Diagnosis not present

## 2017-03-09 DIAGNOSIS — R8299 Other abnormal findings in urine: Secondary | ICD-10-CM | POA: Diagnosis not present

## 2017-03-09 DIAGNOSIS — Z125 Encounter for screening for malignant neoplasm of prostate: Secondary | ICD-10-CM | POA: Diagnosis not present

## 2017-03-09 DIAGNOSIS — E7800 Pure hypercholesterolemia, unspecified: Secondary | ICD-10-CM | POA: Diagnosis not present

## 2017-03-12 DIAGNOSIS — Z1212 Encounter for screening for malignant neoplasm of rectum: Secondary | ICD-10-CM | POA: Diagnosis not present

## 2017-03-16 DIAGNOSIS — M459 Ankylosing spondylitis of unspecified sites in spine: Secondary | ICD-10-CM | POA: Diagnosis not present

## 2017-03-16 DIAGNOSIS — I480 Paroxysmal atrial fibrillation: Secondary | ICD-10-CM | POA: Diagnosis not present

## 2017-03-16 DIAGNOSIS — J302 Other seasonal allergic rhinitis: Secondary | ICD-10-CM | POA: Diagnosis not present

## 2017-03-16 DIAGNOSIS — Z0000 Encounter for general adult medical examination without abnormal findings: Secondary | ICD-10-CM | POA: Diagnosis not present

## 2017-03-16 DIAGNOSIS — Z6826 Body mass index (BMI) 26.0-26.9, adult: Secondary | ICD-10-CM | POA: Diagnosis not present

## 2017-03-16 DIAGNOSIS — Z1389 Encounter for screening for other disorder: Secondary | ICD-10-CM | POA: Diagnosis not present

## 2017-03-16 DIAGNOSIS — I341 Nonrheumatic mitral (valve) prolapse: Secondary | ICD-10-CM | POA: Diagnosis not present

## 2017-03-16 DIAGNOSIS — E784 Other hyperlipidemia: Secondary | ICD-10-CM | POA: Diagnosis not present

## 2017-03-16 DIAGNOSIS — R2689 Other abnormalities of gait and mobility: Secondary | ICD-10-CM | POA: Diagnosis not present

## 2017-03-16 DIAGNOSIS — N401 Enlarged prostate with lower urinary tract symptoms: Secondary | ICD-10-CM | POA: Diagnosis not present

## 2017-03-16 DIAGNOSIS — G4733 Obstructive sleep apnea (adult) (pediatric): Secondary | ICD-10-CM | POA: Diagnosis not present

## 2017-03-16 DIAGNOSIS — I8390 Asymptomatic varicose veins of unspecified lower extremity: Secondary | ICD-10-CM | POA: Diagnosis not present

## 2017-05-25 DIAGNOSIS — D1801 Hemangioma of skin and subcutaneous tissue: Secondary | ICD-10-CM | POA: Diagnosis not present

## 2017-05-25 DIAGNOSIS — L304 Erythema intertrigo: Secondary | ICD-10-CM | POA: Diagnosis not present

## 2017-05-25 DIAGNOSIS — D225 Melanocytic nevi of trunk: Secondary | ICD-10-CM | POA: Diagnosis not present

## 2017-05-25 DIAGNOSIS — L821 Other seborrheic keratosis: Secondary | ICD-10-CM | POA: Diagnosis not present

## 2017-05-25 DIAGNOSIS — L814 Other melanin hyperpigmentation: Secondary | ICD-10-CM | POA: Diagnosis not present

## 2017-06-24 DIAGNOSIS — I272 Pulmonary hypertension, unspecified: Secondary | ICD-10-CM | POA: Diagnosis not present

## 2017-06-24 DIAGNOSIS — E7849 Other hyperlipidemia: Secondary | ICD-10-CM | POA: Diagnosis not present

## 2017-06-24 DIAGNOSIS — I2789 Other specified pulmonary heart diseases: Secondary | ICD-10-CM | POA: Diagnosis not present

## 2017-07-09 DIAGNOSIS — M5136 Other intervertebral disc degeneration, lumbar region: Secondary | ICD-10-CM | POA: Diagnosis not present

## 2017-07-09 DIAGNOSIS — M150 Primary generalized (osteo)arthritis: Secondary | ICD-10-CM | POA: Diagnosis not present

## 2017-07-09 DIAGNOSIS — E663 Overweight: Secondary | ICD-10-CM | POA: Diagnosis not present

## 2017-07-09 DIAGNOSIS — I480 Paroxysmal atrial fibrillation: Secondary | ICD-10-CM | POA: Diagnosis not present

## 2017-07-09 DIAGNOSIS — M450 Ankylosing spondylitis of multiple sites in spine: Secondary | ICD-10-CM | POA: Diagnosis not present

## 2017-07-09 DIAGNOSIS — Z6826 Body mass index (BMI) 26.0-26.9, adult: Secondary | ICD-10-CM | POA: Diagnosis not present

## 2017-07-14 ENCOUNTER — Other Ambulatory Visit: Payer: Self-pay | Admitting: Cardiology

## 2017-07-14 ENCOUNTER — Other Ambulatory Visit: Payer: Self-pay | Admitting: Sports Medicine

## 2017-07-15 ENCOUNTER — Other Ambulatory Visit: Payer: Self-pay | Admitting: Cardiology

## 2017-07-15 DIAGNOSIS — I4891 Unspecified atrial fibrillation: Secondary | ICD-10-CM

## 2017-08-19 ENCOUNTER — Ambulatory Visit (INDEPENDENT_AMBULATORY_CARE_PROVIDER_SITE_OTHER): Payer: Medicare Other | Admitting: Sports Medicine

## 2017-08-19 ENCOUNTER — Encounter: Payer: Self-pay | Admitting: Sports Medicine

## 2017-08-19 VITALS — BP 104/53 | HR 60 | Ht 76.0 in | Wt 208.0 lb

## 2017-08-19 DIAGNOSIS — M19019 Primary osteoarthritis, unspecified shoulder: Secondary | ICD-10-CM | POA: Diagnosis not present

## 2017-08-19 DIAGNOSIS — M19011 Primary osteoarthritis, right shoulder: Secondary | ICD-10-CM

## 2017-08-19 DIAGNOSIS — M75101 Unspecified rotator cuff tear or rupture of right shoulder, not specified as traumatic: Secondary | ICD-10-CM | POA: Diagnosis not present

## 2017-08-19 DIAGNOSIS — M79671 Pain in right foot: Secondary | ICD-10-CM | POA: Diagnosis not present

## 2017-08-19 NOTE — Progress Notes (Signed)
RT shoulder pain  First seen by meet with RC tendinopathy 03/2016 Pain on overhead activity Small supraspinatus tear healed with NTG and HEP Had some humeral head irregulaity Partial Biceps tendon tear  Pain has recurred and worsened over past 6 mos Saw Dr Onnie Graham XR showed OA  Past HX Diagnosed as having ankylosing spondylitis at [facility] Not on meds for this but periodic flares of LBP and other joint and extremity pain Peripheral neruopathy A fib  ROS Nocturnal pain is primarily positional Pain w reaching overhead HEP has helped lessen pain  PE Pleasant older M in nad BP (!) 104/53   Pulse 60   Ht 6\' 4"  (1.93 m)   Wt 208 lb (94.3 kg)   BMI 25.32 kg/m   RT shoulder Pain on arc 90 to 120 degrees elevation Pain on forward flexion > 150 deg No pain on extension IR/ ER minimal pain Xover chest painful on RT  Good strength Can get full ROM but does experience pain  XR review: Loss of superior space GH joint Sclerosis and narrowing medial GH joint Humeral head is shifted and slightly irregular

## 2017-08-19 NOTE — Assessment & Plan Note (Signed)
He has some progressive OA Some chronic RC tearing  I think he is a good candidate for exercise program  Refer to PT  Modify overhead and cross body exercises  If not improving I want to repeat US of RC

## 2017-08-24 ENCOUNTER — Encounter: Payer: Self-pay | Admitting: Physical Therapy

## 2017-08-24 ENCOUNTER — Other Ambulatory Visit: Payer: Self-pay

## 2017-08-24 ENCOUNTER — Ambulatory Visit: Payer: Medicare Other | Attending: Sports Medicine | Admitting: Physical Therapy

## 2017-08-24 DIAGNOSIS — M25511 Pain in right shoulder: Secondary | ICD-10-CM | POA: Diagnosis not present

## 2017-08-24 DIAGNOSIS — G8929 Other chronic pain: Secondary | ICD-10-CM

## 2017-08-24 DIAGNOSIS — M25611 Stiffness of right shoulder, not elsewhere classified: Secondary | ICD-10-CM | POA: Diagnosis not present

## 2017-08-25 NOTE — Therapy (Signed)
[place], [place], [address] Phone: [phone]   Fax:  [phone]  Physical Therapy Evaluation  Patient Details  Name: Wesley [name]. MRN: [id number] Date of Birth: 03-15-1941 Referring Provider: DR Hector Shade    Encounter Date: 08/24/2017  PT End of Session - 08/24/17 1535    Visit Number  1    Number of Visits  12    Date for PT Re-Evaluation  10/05/17    Authorization Type  Medicare     PT Start Time  1330    PT Stop Time  1428    PT Time Calculation (min)  58 min    Activity Tolerance  Patient tolerated treatment well    Behavior During Therapy  [facility] for tasks assessed/performed       Past Medical History:  Diagnosis Date  . A-fib [facility])    cardioversion x2  . Ankylosing spondylitis ([place])   . Atrial flutter ([place])   . GERD (gastroesophageal reflux disease)   . Hemorrhoids   . Irregular heart beat   . MVP (mitral valve prolapse)    WITH A MIDSYSTOLIC CLICK  . OSA (obstructive sleep apnea)    moderate OSA NPSG 10/24/09 - AHO 21.8/hr  . Peripheral neuropathy   . Vitamin B12 deficiency     Past Surgical History:  Procedure Laterality Date  . ABLATION    . COLONOSCOPY    . INGUINAL HERNIA REPAIR    . venous ligation     for varices left lower leg    There were no vitals filed for this visit.   Subjective Assessment - 08/24/17 1336    Subjective  Patient has a long history of right shoulder pain. The pain has increased over the past 3-4 years. He has been able to keep it under control with exercises but recently the pain has increased. He has been to Dr Onnie Graham who sugggested he may need a total shoulder replacement at some point. He has the most dificulty when reaching overhead.     Limitations  Walking    Diagnostic tests  X-ray: per patient arthritis in the shoulder     Currently in Pain?  Yes    Pain Score  6  with reaching     Pain Location  Shoulder    Pain Orientation  Right     Pain Descriptors / Indicators  Aching    Pain Type  Chronic pain    Pain Onset  More than a month ago    Pain Frequency  Constant    Aggravating Factors   reaching     Pain Relieving Factors  Not using the shoulder     Multiple Pain Sites  No         [facility] PT Assessment - 08/25/17 0001      Assessment   Medical Diagnosis  Right Shoulder Pain     Referring Provider  DR Hector Shade     Onset Date/Surgical Date  -- 4 years prior     Hand Dominance  Right    Prior Therapy  Brother was a PT and gave him exercises many years ago       Precautions   Precautions  None      Restrictions   Weight Bearing Restrictions  No      Balance Screen   Has the patient fallen in the past 6 months  No    Has the patient had a decrease in  activity level because of a fear of falling?   No    Is the patient reluctant to leave their home because of a fear of falling?   No      Home Environment   Additional Comments  Nothing significant       Prior Function   Level of Independence  Independent    Vocation  Retired    Leisure  Goes to [employer] and works out       Charity fundraiser Status  Within [employer] for tasks assessed    Attention  Focused    Focused Attention  Appears intact    Memory  Appears intact    Awareness  Appears intact    Problem Solving  Appears intact      Sensation   Additional Comments  Peripheral neuropathy       Coordination   Gross Motor Movements are Fluid and Coordinated  Yes    Fine Motor Movements are Fluid and Coordinated  Yes      Posture/Postural Control   Posture/Postural Control  Postural limitations    Postural Limitations  Rounded Shoulders;Forward head      AROM   Right Shoulder Flexion  140 Degrees    Right Shoulder Internal Rotation  -- Pain but can reach to T12    Right Shoulder External Rotation  -- Pain when reaching behind his head      PROM   Right Shoulder Flexion  150 Degrees    Right Shoulder Internal Rotation   80 Degrees    Right Shoulder External Rotation  25 Degrees      Strength   Overall Strength Comments  5/5 gross left     Strength Assessment Site  Shoulder    Right/Left Shoulder  Right    Right Shoulder Flexion  4/5    Right Shoulder External Rotation  4/5    Right Shoulder Horizontal ABduction  4+/5      Palpation   Palpation comment  No tendrness to palpation       Special Tests    Special Tests  Laxity/Instability Tests;Biceps/Labral Tests    Rotator Cuff Impingment tests  Empty Can test;Speed's test;Drop Arm test;Hawkins- Kennedy test      Hawkins-Kennedy test   Comments  (+) on the right       Empty Can test   Comment  (-) R       Drop Arm test   Comment  (-) right       Speed's test   Comment  (-) right              Objective measurements completed on examination: See above findings.      [place] Adult PT Treatment/Exercise - 08/25/17 0001      Exercises   Exercises  Shoulder      Shoulder Exercises: Supine   Other Supine Exercises  wand ER x10  5 sec hold       Shoulder Exercises: Sidelying   Other Sidelying Exercises  sidelying ER x10       Shoulder Exercises: Standing   Other Standing Exercises  scap retraction red 2x10; shoulder extension 2x10 red              PT Education - 08/24/17 1533    Education provided  Yes    Education Details  HEP; symptom mangement; not making his shoulder too sore     Person(s) Educated  Patient    Methods  Explanation;Demonstration;Tactile cues;Verbal cues    Comprehension  Verbalized understanding;Returned demonstration;Verbal cues required;Tactile cues required;Need further instruction       PT Short Term Goals - 08/24/17 1454      PT SHORT TERM GOAL #1   Title  Patient will increase right shoulder external rotation by 25 degrees     Time  4    Period  Weeks    Status  New    Target Date  09/21/17      PT SHORT TERM GOAL #2   Title  Patient will demsotrate 5/5 gross right shoulder strength      Time  4    Period  Weeks    Status  New    Target Date  09/21/17      PT SHORT TERM GOAL #3   Title  Patient will be indepdnent with basic shoulder strength and stability program without increased pain    Time  4    Period  Weeks    Status  New    Target Date  09/21/17        PT Long Term Goals - 08/24/17 1457      PT LONG TERM GOAL #1   Title  Patient willreach behind his head without pain for hygine     Time  8    Period  Weeks    Status  New    Target Date  10/19/17      PT LONG TERM GOAL #2   Title  Patient will reach behind his back without pain in order to put a belt on     Time  8    Period  Weeks    Status  New    Target Date  10/20/17      PT LONG TERM GOAL #3   Title  Patient will demostrate 30 % limitation on FOTO    Time  8    Period  Weeks    Status  New    Target Date  10/20/17             Plan - 08/24/17 1536    Clinical Impression Statement  Patient is a 77 year old male with right shoulder pain when reaching overhead. Singsn and symptoms are consitent with diagnosis or shoulder arthritis and impingement. He singificant limitations in active and passive right shoulder ER. He has pain with fucntional movements behind his head and behind his back. He would benefit from skiilled therapy to improve mevement and ability to use his dominant arm. He is perfroming some exercises at home already. Therapy will assess technique with exercises he is perfroming already.       History and Personal Factors relevant to plan of care:  A-fib; Cervical OA     Clinical Presentation  Stable    Clinical Decision Making  Low    Rehab Potential  Good    PT Frequency  2x / week    PT Duration  6 weeks    PT Treatment/Interventions  ADLs/Self Care Home Management;Cryotherapy;Electrical Stimulation;Iontophoresis 4mg /ml Dexamethasone;Moist Heat;Ultrasound;Therapeutic exercise;Therapeutic activities;Gait training;Neuromuscular re-education;Patient/family education;Passive  range of motion;Manual techniques;Dry needling;Taping    PT Next Visit Plan  add supine flexion in pain free range; supine d2 flexion in pain free range. Supine ABC with weight; shoulder mboilizations; pec stretch    PT Home Exercise Plan  scap retraction/ shoulder extension; ER stretch; side lying ER     Consulted and Agree with Plan of Care  Patient  Patient will benefit from skilled therapeutic intervention in order to improve the following deficits and impairments:  Pain, Impaired UE functional use, Decreased activity tolerance, Decreased endurance, Decreased range of motion, Decreased strength, Improper body mechanics  Visit Diagnosis: Chronic right shoulder pain  Stiffness of right shoulder, not elsewhere classified     Problem List Patient Active Problem List   Diagnosis Date Noted  . Bilateral bunions 08/20/2016  . Varicose veins of right lower extremity with complications 66/12/43  . Bruit 10/10/2015  . Varicose veins of leg with complications 99/77/4142  . Orthostatic dizziness 12/01/2013  . Calf pain 11/22/2012  . 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/22/2012  . Peripheral neuropathy 10/21/2012  . Pain in joint, ankle and foot 09/21/2012  . Disturbance of skin sensation 09/21/2012  . 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 Other general symptoms(780.99) 09/21/2012  . Heel pain 06/07/2012  . Plantar fasciitis 06/07/2012  . Mitral valve prolapse 06/03/2011  . Arthritis pain of shoulder 04/03/2011  . Rotator cuff syndrome of right shoulder 04/03/2011  . Paroxysmal atrial fibrillation ([place]) 01/20/2011  . Hypercholesterolemia 01/20/2011  . OBSTRUCTIVE SLEEP APNEA 10/08/2009    Carney Living PT DPT  08/25/2017, 8:35 AM  [facility] [address] [place], [place], [address] Phone: [phone]   Fax:  [phone]  Name: Dequane Strahan. MRN: [id number] Date of Birth: [birth year]

## 2017-09-07 ENCOUNTER — Ambulatory Visit: Payer: Medicare Other | Attending: Sports Medicine | Admitting: Physical Therapy

## 2017-09-07 ENCOUNTER — Encounter: Payer: Self-pay | Admitting: Physical Therapy

## 2017-09-07 DIAGNOSIS — M25611 Stiffness of right shoulder, not elsewhere classified: Secondary | ICD-10-CM

## 2017-09-07 DIAGNOSIS — M25511 Pain in right shoulder: Secondary | ICD-10-CM | POA: Insufficient documentation

## 2017-09-07 DIAGNOSIS — G8929 Other chronic pain: Secondary | ICD-10-CM

## 2017-09-07 NOTE — Therapy (Signed)
[place], [place], [address] Phone: [phone]   Fax:  [phone]  Physical Therapy Treatment  Patient Details  Name: Wesley [name]. MRN: [id number] Date of Birth: 04/13/1941 Referring Provider: DR Hector Shade    Encounter Date: 09/07/2017  PT End of Session - 09/07/17 1026    Visit Number  2    Date for PT Re-Evaluation  10/05/17    Authorization Type  Medicare     PT Start Time  1015    PT Stop Time  1056    PT Time Calculation (min)  41 min    Activity Tolerance  Patient tolerated treatment well    Behavior During Therapy  [facility] for tasks assessed/performed       Past Medical History:  Diagnosis Date  . A-fib [facility])    cardioversion x2  . Ankylosing spondylitis ([place])   . Atrial flutter ([place])   . GERD (gastroesophageal reflux disease)   . Hemorrhoids   . Irregular heart beat   . MVP (mitral valve prolapse)    WITH A MIDSYSTOLIC CLICK  . OSA (obstructive sleep apnea)    moderate OSA NPSG 10/24/09 - AHO 21.8/hr  . Peripheral neuropathy   . Vitamin B12 deficiency     Past Surgical History:  Procedure Laterality Date  . ABLATION    . COLONOSCOPY    . INGUINAL HERNIA REPAIR    . venous ligation     for varices left lower leg    There were no vitals filed for this visit.  Subjective Assessment - 09/07/17 2040    Subjective  Patient has stopped doing as much overhead activity. he feels like his shoulder is getting a little better.     Limitations  Walking    Diagnostic tests  X-ray: per patient arthritis in the shoulder     Currently in Pain?  Yes    Pain Score  3     Pain Location  Shoulder    Pain Orientation  Right    Pain Descriptors / Indicators  Aching    Pain Type  Chronic pain    Pain Onset  More than a month ago    Pain Frequency  Constant    Aggravating Factors   reaching     Pain Relieving Factors  not using the shoulder                       [facility]  Adult PT Treatment/Exercise - 09/07/17 0001      Posture/Postural Control   Posture/Postural Control  Postural limitations    Postural Limitations  Rounded Shoulders;Forward head      Exercises   Exercises  Shoulder      Shoulder Exercises: Supine   Other Supine Exercises  wand ER x10  5 sec hold     Other Supine Exercises  wand flexion 2x10       Shoulder Exercises: Sidelying   Other Sidelying Exercises  sidelying ER 2x10 1 lb       Shoulder Exercises: Standing   External Rotation Limitations  red 2x10     Internal Rotation Limitations  red 2x10     Extension Limitations  2x10 red     Row Limitations  2x10 red     Other Standing Exercises  scap retraction red 2x10; shoulder extension 2x10 red       Manual Therapy   Manual Therapy  Joint mobilization;Passive ROM  Joint Mobilization  Grade I and II AP, PA and inferior glides              PT Education - 09/07/17 1026    Education provided  Yes    Education Details  reviewed ther-ex     Person(s) Educated  Patient    Methods  Explanation;Demonstration;Tactile cues;Verbal cues    Comprehension  Verbalized understanding;Returned demonstration;Verbal cues required;Tactile cues required       PT Short Term Goals - 09/07/17 2045      PT SHORT TERM GOAL #1   Title  Patient will increase right shoulder external rotation by 25 degrees     Time  4    Period  Weeks    Status  On-going      PT SHORT TERM GOAL #2   Title  Patient will demsotrate 5/5 gross right shoulder strength     Time  4    Period  Weeks    Status  On-going      PT SHORT TERM GOAL #3   Title  Patient will be indepdnent with basic shoulder strength and stability program without increased pain    Time  4    Period  Weeks    Status  On-going        PT Long Term Goals - 08/24/17 1457      PT LONG TERM GOAL #1   Title  Patient willreach behind his head without pain for hygine     Time  8    Period  Weeks    Status  New    Target Date   10/19/17      PT LONG TERM GOAL #2   Title  Patient will reach behind his back without pain in order to put a belt on     Time  8    Period  Weeks    Status  New    Target Date  10/20/17      PT LONG TERM GOAL #3   Title  Patient will demostrate 30 % limitation on FOTO    Time  8    Period  Weeks    Status  New    Target Date  10/20/17            Plan - 09/07/17 1051    Clinical Impression Statement  Patient required cuing to relax with low grade joint mobilizations. He tolerated ther-ex well. Therapy added IR and Er strengthening as well as pulleys. He had no pain after treatment. Therapy advanced the patient to green band exercises.     Clinical Presentation  Stable    Clinical Decision Making  Low    Rehab Potential  Good    PT Frequency  2x / week    PT Duration  6 weeks    PT Treatment/Interventions  ADLs/Self Care Home Management;Cryotherapy;Electrical Stimulation;Iontophoresis 4mg /ml Dexamethasone;Moist Heat;Ultrasound;Therapeutic exercise;Therapeutic activities;Gait training;Neuromuscular re-education;Patient/family education;Passive range of motion;Manual techniques;Dry needling;Taping    PT Next Visit Plan  add supine flexion in pain free range; supine d2 flexion in pain free range. Supine ABC with weight; shoulder mboilizations; pec stretch    PT Home Exercise Plan  scap retraction/ shoulder extension; ER stretch; side lying ER     Consulted and Agree with Plan of Care  Patient       Patient will benefit from skilled therapeutic intervention in order to improve the following deficits and impairments:  Pain, Impaired UE functional use, Decreased activity tolerance, Decreased endurance, Decreased  range of motion, Decreased strength, Improper body mechanics  Visit Diagnosis: Chronic right shoulder pain  Stiffness of right shoulder, not elsewhere classified     Problem List Patient Active Problem List   Diagnosis Date Noted  . Bilateral bunions 08/20/2016  .  Varicose veins of right lower extremity with complications 79/48/0165  . Bruit 10/10/2015  . Varicose veins of leg with complications 53/74/8270  . Orthostatic dizziness 12/01/2013  . Calf pain 11/22/2012  . 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/22/2012  . Peripheral neuropathy 10/21/2012  . Pain in joint, ankle and foot 09/21/2012  . Disturbance of skin sensation 09/21/2012  . 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 Other general symptoms(780.99) 09/21/2012  . Heel pain 06/07/2012  . Plantar fasciitis 06/07/2012  . Mitral valve prolapse 06/03/2011  . Arthritis pain of shoulder 04/03/2011  . Rotator cuff syndrome of right shoulder 04/03/2011  . Paroxysmal atrial fibrillation ([place]) 01/20/2011  . Hypercholesterolemia 01/20/2011  . OBSTRUCTIVE SLEEP APNEA 10/08/2009    Carney Living PT DPT  09/07/2017, 8:49 PM  [facility] [address] [place], [place], [address] Phone: [phone]   Fax:  [phone]  Name: Wesley [name]. MRN: [id number] Date of Birth: September 07, 1940

## 2017-09-14 ENCOUNTER — Encounter: Payer: Self-pay | Admitting: Physical Therapy

## 2017-09-14 ENCOUNTER — Ambulatory Visit: Payer: Medicare Other | Admitting: Physical Therapy

## 2017-09-14 DIAGNOSIS — M25611 Stiffness of right shoulder, not elsewhere classified: Secondary | ICD-10-CM | POA: Diagnosis not present

## 2017-09-14 DIAGNOSIS — M25511 Pain in right shoulder: Principal | ICD-10-CM

## 2017-09-14 DIAGNOSIS — G8929 Other chronic pain: Secondary | ICD-10-CM

## 2017-09-15 NOTE — Therapy (Signed)
[place], [place], [address] Phone: [phone]   Fax:  [phone]  Physical Therapy Treatment  Patient Details  Name: Wesley [name]. MRN: [id number] Date of Birth: 03/15/1941 Referring Provider: DR Hector Shade    Encounter Date: 09/14/2017  PT End of Session - 09/15/17 0903    Visit Number  3    Number of Visits  12    Date for PT Re-Evaluation  10/05/17    Authorization Type  Medicare     PT Start Time  1015    PT Stop Time  1057    PT Time Calculation (min)  42 min    Activity Tolerance  Patient tolerated treatment well    Behavior During Therapy  [facility] for tasks assessed/performed       Past Medical History:  Diagnosis Date  . A-fib [facility])    cardioversion x2  . Ankylosing spondylitis ([place])   . Atrial flutter ([place])   . GERD (gastroesophageal reflux disease)   . Hemorrhoids   . Irregular heart beat   . MVP (mitral valve prolapse)    WITH A MIDSYSTOLIC CLICK  . OSA (obstructive sleep apnea)    moderate OSA NPSG 10/24/09 - AHO 21.8/hr  . Peripheral neuropathy   . Vitamin B12 deficiency     Past Surgical History:  Procedure Laterality Date  . ABLATION    . COLONOSCOPY    . INGUINAL HERNIA REPAIR    . venous ligation     for varices left lower leg    There were no vitals filed for this visit.  Subjective Assessment - 09/14/17 1025    Subjective  Patient reports he has been a little sore but it has not gotten bad. He has been working on his exercises. He had no pain with his home exercises.     Limitations  Walking    Diagnostic tests  X-ray: per patient arthritis in the shoulder     Currently in Pain?  Yes    Pain Score  3     Pain Location  Shoulder    Pain Orientation  Right    Pain Descriptors / Indicators  Aching    Pain Type  Chronic pain    Pain Onset  More than a month ago    Pain Frequency  Constant    Aggravating Factors   reaching     Pain Relieving Factors  not  using the shoulder                       [facility] Adult PT Treatment/Exercise - 09/15/17 0001      Shoulder Exercises: Supine   Other Supine Exercises  wand ER x10  5 sec hold ; supine ABC 2lb to A-Z     Other Supine Exercises  wand flexion 2x10       Shoulder Exercises: Sidelying   Other Sidelying Exercises  sidelying ER 2x10 1 lb       Shoulder Exercises: Standing   External Rotation Limitations  red 2x10     Internal Rotation Limitations  red 2x10     Extension Limitations  2x10 red     Row Limitations  2x10 red     Other Standing Exercises  scap retraction red 2x10; shoulder extension 2x10 red     Other Standing Exercises  pec stretch 3x15 sec hold;       Manual Therapy   Manual Therapy  Joint  mobilization;Passive ROM    Joint Mobilization  Grade II and III AP, PA and inferior glides                PT Short Term Goals - 09/07/17 2045      PT SHORT TERM GOAL #1   Title  Patient will increase right shoulder external rotation by 25 degrees     Time  4    Period  Weeks    Status  On-going      PT SHORT TERM GOAL #2   Title  Patient will demsotrate 5/5 gross right shoulder strength     Time  4    Period  Weeks    Status  On-going      PT SHORT TERM GOAL #3   Title  Patient will be indepdnent with basic shoulder strength and stability program without increased pain    Time  4    Period  Weeks    Status  On-going        PT Long Term Goals - 08/24/17 1457      PT LONG TERM GOAL #1   Title  Patient willreach behind his head without pain for hygine     Time  8    Period  Weeks    Status  New    Target Date  10/19/17      PT LONG TERM GOAL #2   Title  Patient will reach behind his back without pain in order to put a belt on     Time  8    Period  Weeks    Status  New    Target Date  10/20/17      PT LONG TERM GOAL #3   Title  Patient will demostrate 30 % limitation on FOTO    Time  8    Period  Weeks    Status  New    Target Date   10/20/17            Plan - 09/15/17 0907    Clinical Impression Statement  Patients ER is improving prer visual inpsection. he continues to have pain at nd range. Therapy reviewed pec stretching. He was also given intenral rotation and supine ABC's to work on. he required cuing to not over-do the supine ABC exercise. Patient will [place] discharge after last visit.     Clinical Presentation  Stable    Clinical Decision Making  Low    Rehab Potential  Good    PT Frequency  2x / week    PT Duration  6 weeks    PT Treatment/Interventions  ADLs/Self Care Home Management;Cryotherapy;Electrical Stimulation;Iontophoresis 4mg /ml Dexamethasone;Moist Heat;Ultrasound;Therapeutic exercise;Therapeutic activities;Gait training;Neuromuscular re-education;Patient/family education;Passive range of motion;Manual techniques;Dry needling;Taping    PT Next Visit Plan  add supine flexion in pain free range; supine d2 flexion in pain free range. Supine ABC with weight; shoulder mboilizations; pec stretch    PT Home Exercise Plan  scap retraction/ shoulder extension; ER stretch; side lying ER     Consulted and Agree with Plan of Care  Patient       Patient will benefit from skilled therapeutic intervention in order to improve the following deficits and impairments:  Pain, Impaired UE functional use, Decreased activity tolerance, Decreased endurance, Decreased range of motion, Decreased strength, Improper body mechanics  Visit Diagnosis: Chronic right shoulder pain  Stiffness of right shoulder, not elsewhere classified     Problem List Patient Active Problem List   Diagnosis Date  Noted  . Bilateral bunions 08/20/2016  . Varicose veins of right lower extremity with complications 61/84/8592  . Bruit 10/10/2015  . Varicose veins of leg with complications 76/39/4320  . Orthostatic dizziness 12/01/2013  . Calf pain 11/22/2012  . 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/22/2012  . Peripheral neuropathy 10/21/2012  .  Pain in joint, ankle and foot 09/21/2012  . Disturbance of skin sensation 09/21/2012  . 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 ([place]) 09/21/2012  . Other general symptoms(780.99) 09/21/2012  . Heel pain 06/07/2012  . Plantar fasciitis 06/07/2012  . Mitral valve prolapse 06/03/2011  . Arthritis pain of shoulder 04/03/2011  . Rotator cuff syndrome of right shoulder 04/03/2011  . Paroxysmal atrial fibrillation ([place]) 01/20/2011  . Hypercholesterolemia 01/20/2011  . OBSTRUCTIVE SLEEP APNEA 10/08/2009    Carney Living PT DPT  09/15/2017, 9:11 AM  [facility] [address] [place], [place], [address] Phone: [phone]   Fax:  [phone]  Name: Malacki Mcphearson. MRN: [id number] Date of Birth: March 14, 1941

## 2017-09-21 ENCOUNTER — Encounter: Payer: Self-pay | Admitting: Physical Therapy

## 2017-09-21 ENCOUNTER — Ambulatory Visit: Payer: Medicare Other | Admitting: Physical Therapy

## 2017-09-21 DIAGNOSIS — G8929 Other chronic pain: Secondary | ICD-10-CM

## 2017-09-21 DIAGNOSIS — M25511 Pain in right shoulder: Principal | ICD-10-CM

## 2017-09-21 DIAGNOSIS — M25611 Stiffness of right shoulder, not elsewhere classified: Secondary | ICD-10-CM

## 2017-09-21 NOTE — Therapy (Signed)
[place], [place], [address] Phone: [phone]   Fax:  [phone]  Physical Therapy Treatment  Patient Details  Name: Wesley [name]. MRN: [id number] Date of Birth: [birth year] Referring Provider: DR Hector Shade    Encounter Date: 09/21/2017  PT End of Session - 09/21/17 1402    Visit Number  4    Number of Visits  12    Date for PT Re-Evaluation  10/05/17    Authorization Type  Medicare     PT Start Time  1015    PT Stop Time  1056    PT Time Calculation (min)  41 min    Activity Tolerance  Patient tolerated treatment well    Behavior During Therapy  [facility] for tasks assessed/performed       Past Medical History:  Diagnosis Date  . A-fib [facility])    cardioversion x2  . Ankylosing spondylitis ([place])   . Atrial flutter ([place])   . GERD (gastroesophageal reflux disease)   . Hemorrhoids   . Irregular heart beat   . MVP (mitral valve prolapse)    WITH A MIDSYSTOLIC CLICK  . OSA (obstructive sleep apnea)    moderate OSA NPSG 10/24/09 - AHO 21.8/hr  . Peripheral neuropathy   . Vitamin B12 deficiency     Past Surgical History:  Procedure Laterality Date  . ABLATION    . COLONOSCOPY    . INGUINAL HERNIA REPAIR    . venous ligation     for varices left lower leg    There were no vitals filed for this visit.  Subjective Assessment - 09/21/17 1411    Subjective  Patient has been perfroming his exercise. He feels like his shoulder is improvin slowly.     Currently in Pain?  Yes    Pain Score  2     Pain Location  Shoulder    Pain Orientation  Right    Pain Descriptors / Indicators  Aching    Pain Type  Chronic pain    Pain Onset  More than a month ago    Pain Frequency  Constant    Aggravating Factors   reaching     Pain Relieving Factors  not using the shoulder     Multiple Pain Sites  No         [facility] PT Assessment - 09/21/17 0001      PROM   Right Shoulder External Rotation  55  Degrees                  [facility] Adult PT Treatment/Exercise - 09/21/17 0001      Self-Care   Self-Care  Other Self-Care Comments    Other Self-Care Comments   Therapy reviewed reps and sets and how to perfrom and progress at home and the gym.       Shoulder Exercises: Supine   Other Supine Exercises  wand ER x10  5 sec hold ; supine ABC 2lb to A-Z     Other Supine Exercises  wand flexion 2x10       Shoulder Exercises: Sidelying   Other Sidelying Exercises  sidelying ER 2x10 1 lb       Shoulder Exercises: Power Tower   Row  10 reps;Limitations    Row Limitations  15 lbs     Other Power Tower Exercises  pull down to chest 2x20 second hold       Manual Therapy   Manual Therapy  Joint mobilization;Passive ROM    Joint Mobilization  Grade II and III AP, PA and inferior glides     Passive ROM  PROM into all planes              PT Education - 09/21/17 1359    Education provided  Yes    Education Details  reviewed proper exercise prescription and use of weights at the gym     Person(s) Educated  Patient    Methods  Explanation;Demonstration;Verbal cues;Tactile cues    Comprehension  Verbalized understanding;Returned demonstration;Verbal cues required;Tactile cues required       PT Short Term Goals - 09/21/17 1409      PT SHORT TERM GOAL #1   Title  Patient will increase right shoulder external rotation by 25 degrees     Time  4    Period  Weeks    Status  On-going      PT SHORT TERM GOAL #2   Title  Patient will demsotrate 5/5 gross right shoulder strength     Time  4    Period  Weeks    Status  On-going      PT SHORT TERM GOAL #3   Title  Patient will be indepdnent with basic shoulder strength and stability program without increased pain    Time  4    Period  Weeks    Status  On-going        PT Long Term Goals - 08/24/17 1457      PT LONG TERM GOAL #1   Title  Patient willreach behind his head without pain for hygine     Time  8    Period   Weeks    Status  New    Target Date  10/19/17      PT LONG TERM GOAL #2   Title  Patient will reach behind his back without pain in order to put a belt on     Time  8    Period  Weeks    Status  New    Target Date  10/20/17      PT LONG TERM GOAL #3   Title  Patient will demostrate 30 % limitation on FOTO    Time  8    Period  Weeks    Status  New    Target Date  10/20/17            Plan - 09/21/17 1405    Clinical Impression Statement  Patient is comfortable with his gym program. He will go home and work on his program for a few weeks. If he feels like he needs it he may come back for 1 more follow up visit. His range and strength has improved. His FOTO score got worse.     Clinical Presentation  Stable    Clinical Decision Making  Low    Rehab Potential  Good    PT Frequency  2x / week    PT Duration  6 weeks    PT Treatment/Interventions  ADLs/Self Care Home Management;Cryotherapy;Electrical Stimulation;Iontophoresis 4mg /ml Dexamethasone;Moist Heat;Ultrasound;Therapeutic exercise;Therapeutic activities;Gait training;Neuromuscular re-education;Patient/family education;Passive range of motion;Manual techniques;Dry needling;Taping    PT Next Visit Plan  follow up on patients home program     PT Home Exercise Plan  scap retraction/ shoulder extension; ER stretch; side lying ER     Consulted and Agree with Plan of Care  Patient       Patient will benefit from skilled therapeutic intervention  in order to improve the following deficits and impairments:  Pain, Impaired UE functional use, Decreased activity tolerance, Decreased endurance, Decreased range of motion, Decreased strength, Improper body mechanics  Visit Diagnosis: Chronic right shoulder pain  Stiffness of right shoulder, not elsewhere classified     Problem List Patient Active Problem List   Diagnosis Date Noted  . Bilateral bunions 08/20/2016  . Varicose veins of right lower extremity with complications  91/79/1505  . Bruit 10/10/2015  . Varicose veins of leg with complications 69/79/4801  . Orthostatic dizziness 12/01/2013  . Calf pain 11/22/2012  . 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/22/2012  . Peripheral neuropathy 10/21/2012  . Pain in joint, ankle and foot 09/21/2012  . Disturbance of skin sensation 09/21/2012  . 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 ([place]) 09/21/2012  . Other general symptoms(780.99) 09/21/2012  . Heel pain 06/07/2012  . Plantar fasciitis 06/07/2012  . Mitral valve prolapse 06/03/2011  . Arthritis pain of shoulder 04/03/2011  . Rotator cuff syndrome of right shoulder 04/03/2011  . Paroxysmal atrial fibrillation ([place]) 01/20/2011  . Hypercholesterolemia 01/20/2011  . OBSTRUCTIVE SLEEP APNEA 10/08/2009    Carney Living 09/21/2017, 2:12 PM  [facility] [address] [place], [place], [address] Phone: [phone]   Fax:  [phone]  Name: Wesley [name]. MRN: [id number] Date of Birth: [birth year]

## 2017-09-30 ENCOUNTER — Other Ambulatory Visit: Payer: Self-pay | Admitting: Cardiology

## 2017-09-30 DIAGNOSIS — I4891 Unspecified atrial fibrillation: Secondary | ICD-10-CM

## 2017-09-30 NOTE — Telephone Encounter (Signed)
REFILL 

## 2017-10-08 ENCOUNTER — Encounter: Payer: Self-pay | Admitting: Cardiology

## 2017-10-11 NOTE — Progress Notes (Signed)
HPI: Follow-up atrial fibrillation. Nuclear study March 2006 was normal. Had successful ablation of his atrial fibrillation on 02/19/10 in [place]. Patient continues to be followed there. He has had intermittent infrequent episodes of paroxysmal atrial fibrillation since his ablation treated medically. Abdominal ultrasound March 2017 showed no aneurysm. Echocardiogram April 2017 showed normal LV function, mildly dilated aortic root, mild mitral regurgitation, mild to moderate right ventricular enlargement and moderate tricuspid regurgitation. Since last seen, the patient denies any dyspnea on exertion, orthopnea, PND, pedal edema, palpitations, syncope or chest pain.   Current Outpatient Medications  Medication Sig Dispense Refill  . Alpha-Lipoic Acid 600 MG CAPS Take 600 mg by mouth daily.    Marland Kitchen apixaban (ELIQUIS) 5 MG TABS tablet Take 1 tablet by mouth twice daily, need MD appointment for further refills or send request to PCP 180 tablet 0  . Ascorbic Acid (VITAMIN C) 1000 MG tablet Take 1,000 mg by mouth daily.      . Azelastine HCl 0.15 % SOLN     . b complex vitamins capsule Take 1 capsule by mouth daily.      . cefdinir (OMNICEF) 300 MG capsule     . Cyanocobalamin 5000 MCG SUBL Place 1 tablet under the tongue daily.     . fluticasone (FLONASE) 50 MCG/ACT nasal spray     . gabapentin (NEURONTIN) 300 MG capsule TAKE 1 CAPSULE THREE TIMES A DAY 270 capsule 1  . Loperamide-Simethicone 2-125 MG TABS Take 1 tablet by mouth as needed.     . meloxicam (MOBIC) 15 MG tablet Take 15 mg by mouth daily as needed for pain.    . metoprolol succinate (TOPROL-XL) 25 MG 24 hr tablet TAKE 1 TABLET DAILY (NEED OFFICE VISIT) 90 tablet 0  . Multiple Vitamin (MULTIVITAMIN) tablet Take 1 tablet by mouth daily.      Marland Kitchen NEXIUM 40 MG capsule TAKE 1 CAPSULE DAILY 90 capsule 3  . pregabalin (LYRICA) 75 MG capsule Take 1 capsule (75 mg total) by mouth 2 (two) times daily. 60 capsule 3  .  pyridOXINE (VITAMIN B-6) 50 MG tablet Take 50 mg by mouth 2 (two) times daily.    . traMADol (ULTRAM) 50 MG tablet      No current facility-administered medications for this visit.      Past Medical History:  Diagnosis Date  . A-fib [facility])    cardioversion x2  . Ankylosing spondylitis ([place])   . Atrial flutter ([place])   . GERD (gastroesophageal reflux disease)   . Hemorrhoids   . Irregular heart beat   . MVP (mitral valve prolapse)    WITH A MIDSYSTOLIC CLICK  . OSA (obstructive sleep apnea)    moderate OSA NPSG 10/24/09 - AHO 21.8/hr  . Peripheral neuropathy   . Vitamin B12 deficiency     Past Surgical History:  Procedure Laterality Date  . ABLATION    . COLONOSCOPY    . INGUINAL HERNIA REPAIR    . venous ligation     for varices left lower leg    Social History   Socioeconomic History  . Marital status: Married    Spouse name: Not on file  . Number of children: 2  . Years of education: 55  . Highest education level: Not on file  Occupational History  . Occupation: Scientist, clinical (histocompatibility and immunogenetics): [employer]  Social Needs  . Financial resource strain: Not on file  . Food insecurity:    Worry:  Not on file    Inability: Not on file  . Transportation needs:    Medical: Not on file    Non-medical: Not on file  Tobacco Use  . Smoking status: Former Smoker    Last attempt to quit: 07/27/1969    Years since quitting: 48.2  . Smokeless tobacco: Never Used  Substance and Sexual Activity  . Alcohol use: Yes    Comment: 1-2 most days  . Drug use: No  . Sexual activity: Not on file  Lifestyle  . Physical activity:    Days per week: Not on file    Minutes per session: Not on file  . Stress: Not on file  Relationships  . Social connections:    Talks on phone: Not on file    Gets together: Not on file    Attends religious service: Not on file    Active member of club or organization: Not on file    Attends meetings of clubs or organizations: Not on file    Relationship  status: Not on file  . Intimate partner violence:    Fear of current or ex partner: Not on file    Emotionally abused: Not on file    Physically abused: Not on file    Forced sexual activity: Not on file  Other Topics Concern  . Not on file  Social History Narrative  . Not on file    Family History  Problem Relation Age of Onset  . Prostate cancer Father   . Arthritis Father   . Cancer Father   . Heart failure Mother   . Hypertension Mother     ROS: no fevers or chills, productive cough, hemoptysis, dysphasia, odynophagia, melena, hematochezia, dysuria, hematuria, rash, seizure activity, orthopnea, PND, pedal edema, claudication. Remaining systems are negative.  Physical Exam: Well-developed well-nourished in no acute distress.  Skin is warm and dry.  HEENT is normal.  Neck is supple.  Chest is clear to auscultation with normal expansion.  Cardiovascular exam is regular rate and rhythm.  Abdominal exam nontender or distended. No masses palpated. Extremities show no edema. neuro grossly intact  ECG-sinus rhythm with first-degree AV block.  Left anterior fascicular block.  No ST changes.  Personally reviewed  A/P  1 paroxysmal atrial fibrillation-patient remains in sinus rhythm today.  He continues to be followed in [facility].  Continue metoprolol at present dose.  Continue apixaban.  Hemoglobin and renal function monitored by primary care.  2 moderate tricuspid regurgitation-plan to repeat echocardiogram.  3 palpitations-reasonably well controlled.  Continue present dose of beta-blocker.  Reduced previously because of low blood pressure.  4 mitral valve prolapse-mitral regurgitation was mild on most recent echocardiogram.  Will reassess at time of follow-up echo.  Kirk Ruths, MD

## 2017-10-18 ENCOUNTER — Other Ambulatory Visit: Payer: Self-pay | Admitting: *Deleted

## 2017-10-18 ENCOUNTER — Ambulatory Visit: Payer: Medicare Other | Attending: Sports Medicine | Admitting: Physical Therapy

## 2017-10-18 ENCOUNTER — Encounter: Payer: Self-pay | Admitting: Cardiology

## 2017-10-18 ENCOUNTER — Encounter: Payer: Self-pay | Admitting: Physical Therapy

## 2017-10-18 ENCOUNTER — Ambulatory Visit (INDEPENDENT_AMBULATORY_CARE_PROVIDER_SITE_OTHER): Payer: Medicare Other | Admitting: Cardiology

## 2017-10-18 VITALS — BP 110/62 | HR 69 | Ht 76.0 in | Wt 212.2 lb

## 2017-10-18 DIAGNOSIS — E7800 Pure hypercholesterolemia, unspecified: Secondary | ICD-10-CM | POA: Diagnosis not present

## 2017-10-18 DIAGNOSIS — I4891 Unspecified atrial fibrillation: Secondary | ICD-10-CM

## 2017-10-18 DIAGNOSIS — I480 Paroxysmal atrial fibrillation: Secondary | ICD-10-CM | POA: Diagnosis not present

## 2017-10-18 DIAGNOSIS — G8929 Other chronic pain: Secondary | ICD-10-CM

## 2017-10-18 DIAGNOSIS — M25611 Stiffness of right shoulder, not elsewhere classified: Secondary | ICD-10-CM | POA: Insufficient documentation

## 2017-10-18 DIAGNOSIS — I361 Nonrheumatic tricuspid (valve) insufficiency: Secondary | ICD-10-CM

## 2017-10-18 DIAGNOSIS — M25511 Pain in right shoulder: Secondary | ICD-10-CM | POA: Diagnosis not present

## 2017-10-18 MED ORDER — METOPROLOL SUCCINATE ER 25 MG PO TB24: 25.0000 mg | ORAL_TABLET | Freq: Every day | ORAL | 3 refills | Status: DC

## 2017-10-18 MED ORDER — APIXABAN 5 MG PO TABS: ORAL_TABLET | ORAL | 3 refills | Status: DC

## 2017-10-18 NOTE — Therapy (Addendum)
[place], [place], [address] Phone: [phone]   Fax:  [phone]  Physical Therapy Treatment  Patient Details  Name: Wesley [name]. MRN: [id number] Date of Birth: May 17, 1941 Referring Provider: DR Hector Shade    Encounter Date: 10/18/2017  PT End of Session - 10/18/17 1023    Visit Number  5    Number of Visits  12    Date for PT Re-Evaluation  10/05/17    Authorization Type  Medicare     PT Start Time  1015    PT Stop Time  1100    PT Time Calculation (min)  45 min    Activity Tolerance  Patient tolerated treatment well    Behavior During Therapy  [facility] for tasks assessed/performed       Past Medical History:  Diagnosis Date  . A-fib [facility])    cardioversion x2  . Ankylosing spondylitis ([place])   . Atrial flutter ([place])   . GERD (gastroesophageal reflux disease)   . Hemorrhoids   . Irregular heart beat   . MVP (mitral valve prolapse)    WITH A MIDSYSTOLIC CLICK  . OSA (obstructive sleep apnea)    moderate OSA NPSG 10/24/09 - AHO 21.8/hr  . Peripheral neuropathy   . Vitamin B12 deficiency     Past Surgical History:  Procedure Laterality Date  . ABLATION    . COLONOSCOPY    . INGUINAL HERNIA REPAIR    . venous ligation     for varices left lower leg    There were no vitals filed for this visit.  Subjective Assessment - 10/18/17 1019    Subjective  Patient reports that his right shoulder has been doing better. Pt reports that his workouts have helped with the pain. Pt also reports tightness in his lower back.    Limitations  Walking    Diagnostic tests  X-ray: per patient arthritis in the shoulder     Pain Score  0-No pain         [facility] PT Assessment - 10/18/17 0001      PROM   Right Shoulder External Rotation  49 Degrees            No data recorded       [facility] Adult PT Treatment/Exercise - 10/18/17 0001      Self-Care   Self-Care  Other Self-Care Comments    Other Self-Care Comments   Therapy reviewed reps and sets and how to perfrom and progress at home and the gym.       Shoulder Exercises: Supine   Other Supine Exercises  wand ER x10  5 sec hold     Other Supine Exercises  wand flexion 2x10       Shoulder Exercises: Power Tower   Row  10 reps;Limitations    Row Limitations  15 lbs     Other Power Tower Exercises  pull down to chest 2x20 second hold       Manual Therapy   Manual Therapy  Joint mobilization;Passive ROM    Joint Mobilization  Grade II and III AP, PA and inferior glides     Passive ROM  PROM into all planes              PT Education - 10/18/17 1041    Education provided  Yes    Education Details  Proper shoulder exercise techniques; shoulder anatomy     Person(s) Educated  Patient  Methods  Explanation;Demonstration;Tactile cues;Verbal cues    Comprehension  Verbalized understanding;Returned demonstration       PT Short Term Goals - 09/21/17 1409      PT SHORT TERM GOAL #1   Title  Patient will increase right shoulder external rotation by 25 degrees     Time  4    Period  Weeks    Status  On-going      PT SHORT TERM GOAL #2   Title  Patient will demsotrate 5/5 gross right shoulder strength     Time  4    Period  Weeks    Status  On-going      PT SHORT TERM GOAL #3   Title  Patient will be indepdnent with basic shoulder strength and stability program without increased pain    Time  4    Period  Weeks    Status  On-going        PT Long Term Goals - 08/24/17 1457      PT LONG TERM GOAL #1   Title  Patient willreach behind his head without pain for hygine     Time  8    Period  Weeks    Status  New    Target Date  10/19/17      PT LONG TERM GOAL #2   Title  Patient will reach behind his back without pain in order to put a belt on     Time  8    Period  Weeks    Status  New    Target Date  10/20/17      PT LONG TERM GOAL #3   Title  Patient will demostrate 30 % limitation on FOTO     Time  8    Period  Weeks    Status  New    Target Date  10/20/17            Plan - 10/18/17 1331    Clinical Impression Statement  Pt has been continuing with his gym program and reports that he feels that his workouts have helped decrease his pain. Pt's ER PROM showed a 6 degree decrease. Pt will continue with his home exercises for shoulder strengthening and his gym program.     Clinical Presentation  Stable    Clinical Decision Making  Low    Rehab Potential  Good    PT Frequency  2x / week    PT Duration  6 weeks    PT Treatment/Interventions  ADLs/Self Care Home Management;Cryotherapy;Electrical Stimulation;Iontophoresis 36m/ml Dexamethasone;Moist Heat;Ultrasound;Therapeutic exercise;Therapeutic activities;Gait training;Neuromuscular re-education;Patient/family education;Passive range of motion;Manual techniques;Dry needling;Taping    PT Home Exercise Plan  scap retraction/ shoulder extension; ER stretch; side lying ER     Consulted and Agree with Plan of Care  Patient       Patient will benefit from skilled therapeutic intervention in order to improve the following deficits and impairments:  Pain, Impaired UE functional use, Decreased activity tolerance, Decreased endurance, Decreased range of motion, Decreased strength, Improper body mechanics  Visit Diagnosis: Chronic right shoulder pain  Stiffness of right shoulder, not elsewhere classified  PHYSICAL THERAPY DISCHARGE SUMMARY  Visits from Start of Care: 5  Current functional level related to goals / functional outcomes: Improved shoulder pain    Remaining deficits: Limitations in motion    Education / Equipment: HEP   Plan: Patient agrees to discharge.  Patient goals were met. Patient is being discharged due to meeting the stated rehab  goals.  ?????       Problem List Patient Active Problem List   Diagnosis Date Noted  . Bilateral bunions 08/20/2016  . Varicose veins of right lower extremity with  complications 30/11/1100  . Bruit 10/10/2015  . Varicose veins of leg with complications 06/12/3566  . Orthostatic dizziness 12/01/2013  . Calf pain 11/22/2012  . 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/22/2012  . Peripheral neuropathy 10/21/2012  . Pain in joint, ankle and foot 09/21/2012  . Disturbance of skin sensation 09/21/2012  . 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 ([place]) 09/21/2012  . Other general symptoms(780.99) 09/21/2012  . Heel pain 06/07/2012  . Plantar fasciitis 06/07/2012  . Mitral valve prolapse 06/03/2011  . Arthritis pain of shoulder 04/03/2011  . Rotator cuff syndrome of right shoulder 04/03/2011  . Paroxysmal atrial fibrillation ([place]) 01/20/2011  . Hypercholesterolemia 01/20/2011  . OBSTRUCTIVE SLEEP APNEA 10/08/2009   Carolyne Littles PT DPT  10/18/2017   Cooper Render 10/18/2017, 1:36 PM  [facility] [address] [place], [place], [address] Phone: [phone]   Fax:  [phone]  Name: Wesley [name]. MRN: [id number] Date of Birth: [birth year]

## 2017-10-18 NOTE — Patient Instructions (Signed)

## 2017-10-26 ENCOUNTER — Ambulatory Visit (HOSPITAL_COMMUNITY): Payer: Medicare Other | Attending: Cardiovascular Disease

## 2017-10-26 ENCOUNTER — Other Ambulatory Visit: Payer: Self-pay

## 2017-10-26 DIAGNOSIS — R002 Palpitations: Secondary | ICD-10-CM | POA: Diagnosis not present

## 2017-10-26 DIAGNOSIS — I081 Rheumatic disorders of both mitral and tricuspid valves: Secondary | ICD-10-CM | POA: Diagnosis not present

## 2017-10-26 DIAGNOSIS — Z87891 Personal history of nicotine dependence: Secondary | ICD-10-CM | POA: Insufficient documentation

## 2017-10-26 DIAGNOSIS — I480 Paroxysmal atrial fibrillation: Secondary | ICD-10-CM | POA: Diagnosis not present

## 2017-10-26 DIAGNOSIS — I340 Nonrheumatic mitral (valve) insufficiency: Secondary | ICD-10-CM | POA: Diagnosis present

## 2017-10-26 DIAGNOSIS — G4733 Obstructive sleep apnea (adult) (pediatric): Secondary | ICD-10-CM | POA: Diagnosis not present

## 2017-10-26 DIAGNOSIS — I361 Nonrheumatic tricuspid (valve) insufficiency: Secondary | ICD-10-CM | POA: Diagnosis not present

## 2017-10-28 ENCOUNTER — Ambulatory Visit (INDEPENDENT_AMBULATORY_CARE_PROVIDER_SITE_OTHER): Payer: Medicare Other | Admitting: Sports Medicine

## 2017-10-28 ENCOUNTER — Ambulatory Visit
Admission: RE | Admit: 2017-10-28 | Discharge: 2017-10-28 | Disposition: A | Discharge status: Home / Self Care | Payer: Medicare Other | Source: Ambulatory Visit | Attending: Sports Medicine | Admitting: Sports Medicine

## 2017-10-28 ENCOUNTER — Encounter: Payer: Self-pay | Admitting: Sports Medicine

## 2017-10-28 VITALS — BP 100/64 | Ht 76.0 in | Wt 208.0 lb

## 2017-10-28 DIAGNOSIS — M51369 Other intervertebral disc degeneration, lumbar region without mention of lumbar back pain or lower extremity pain: Secondary | ICD-10-CM | POA: Insufficient documentation

## 2017-10-28 DIAGNOSIS — M47816 Spondylosis without myelopathy or radiculopathy, lumbar region: Secondary | ICD-10-CM | POA: Diagnosis not present

## 2017-10-28 DIAGNOSIS — M545 Low back pain: Secondary | ICD-10-CM | POA: Diagnosis not present

## 2017-10-28 DIAGNOSIS — G8929 Other chronic pain: Secondary | ICD-10-CM | POA: Diagnosis not present

## 2017-10-28 DIAGNOSIS — M5136 Other intervertebral disc degeneration, lumbar region: Secondary | ICD-10-CM

## 2017-10-28 NOTE — Patient Instructions (Signed)
Tumeric is OK to try Usually dose is about 500 mg 2 to 4 x day  CBD oil is popullar but I don't have any evidence  IOK to try your tramadol again from Dr Amil Amen once or twice a day as this will not affect eliquis

## 2017-10-28 NOTE — Progress Notes (Signed)
Low back Pain Now 2 x year bad flare Usually comes after some activity Radiates into left or rt hip  Ankylosing Spondylitis DX in service with HLA B 27 + tests and low back pain When young was on regular NSAIDS At age 77 settled down and not much pain  AFIB On Eliquis Ablation 01/2010 Lifestyle - control weight/ 2 drinks per week only Doing well for 3 years  Fam HX Father ank spondy and fusion Prostate CA died at 78  ROS No true sciatica No foot drop or weakness] neruopathy sxs are a bit worse but gabapentin helped  Physical examination BP 100/64   Ht '6\' 4"'  (1.93 m)   Wt 208 lb (94.3 kg)   BMI 25.32 kg/m   White male who stands with left hip slightly higher than  normal heel and toe walk  limited left lateral bend Limited left rotation Pain at 30 degrees of extension Full flexion  Negative straight leg raise  scoliosis visible over lumbar spine He has bilateral sensory loss in the lower extremities and poor balance  LS spine films Significant DDD of lumbar spine Areas of sclerosis Radiology reading pending

## 2017-11-01 DIAGNOSIS — H2512 Age-related nuclear cataract, left eye: Secondary | ICD-10-CM | POA: Diagnosis not present

## 2017-11-01 DIAGNOSIS — H25811 Combined forms of age-related cataract, right eye: Secondary | ICD-10-CM | POA: Diagnosis not present

## 2017-11-01 DIAGNOSIS — H40013 Open angle with borderline findings, low risk, bilateral: Secondary | ICD-10-CM | POA: Diagnosis not present

## 2017-11-16 DIAGNOSIS — Z87891 Personal history of nicotine dependence: Secondary | ICD-10-CM | POA: Diagnosis not present

## 2017-11-16 DIAGNOSIS — Z8679 Personal history of other diseases of the circulatory system: Secondary | ICD-10-CM | POA: Diagnosis not present

## 2017-11-16 DIAGNOSIS — G629 Polyneuropathy, unspecified: Secondary | ICD-10-CM | POA: Diagnosis not present

## 2017-11-16 DIAGNOSIS — I481 Persistent atrial fibrillation: Secondary | ICD-10-CM | POA: Diagnosis not present

## 2017-11-16 DIAGNOSIS — K589 Irritable bowel syndrome without diarrhea: Secondary | ICD-10-CM | POA: Diagnosis not present

## 2017-11-16 DIAGNOSIS — Z9889 Other specified postprocedural states: Secondary | ICD-10-CM | POA: Diagnosis not present

## 2017-11-16 DIAGNOSIS — I341 Nonrheumatic mitral (valve) prolapse: Secondary | ICD-10-CM | POA: Diagnosis not present

## 2017-11-16 DIAGNOSIS — Z7901 Long term (current) use of anticoagulants: Secondary | ICD-10-CM | POA: Diagnosis not present

## 2017-11-16 DIAGNOSIS — K219 Gastro-esophageal reflux disease without esophagitis: Secondary | ICD-10-CM | POA: Diagnosis not present

## 2017-11-16 DIAGNOSIS — Z79899 Other long term (current) drug therapy: Secondary | ICD-10-CM | POA: Diagnosis not present

## 2017-11-16 DIAGNOSIS — G4733 Obstructive sleep apnea (adult) (pediatric): Secondary | ICD-10-CM | POA: Diagnosis not present

## 2017-11-16 DIAGNOSIS — Z7189 Other specified counseling: Secondary | ICD-10-CM | POA: Diagnosis not present

## 2017-11-23 DIAGNOSIS — I481 Persistent atrial fibrillation: Secondary | ICD-10-CM | POA: Diagnosis not present

## 2017-12-06 DIAGNOSIS — N401 Enlarged prostate with lower urinary tract symptoms: Secondary | ICD-10-CM | POA: Diagnosis not present

## 2017-12-15 DIAGNOSIS — R351 Nocturia: Secondary | ICD-10-CM | POA: Diagnosis not present

## 2017-12-15 DIAGNOSIS — N401 Enlarged prostate with lower urinary tract symptoms: Secondary | ICD-10-CM | POA: Diagnosis not present

## 2018-01-07 DIAGNOSIS — M5136 Other intervertebral disc degeneration, lumbar region: Secondary | ICD-10-CM | POA: Diagnosis not present

## 2018-01-07 DIAGNOSIS — E663 Overweight: Secondary | ICD-10-CM | POA: Diagnosis not present

## 2018-01-07 DIAGNOSIS — M150 Primary generalized (osteo)arthritis: Secondary | ICD-10-CM | POA: Diagnosis not present

## 2018-01-07 DIAGNOSIS — Z6826 Body mass index (BMI) 26.0-26.9, adult: Secondary | ICD-10-CM | POA: Diagnosis not present

## 2018-01-07 DIAGNOSIS — I480 Paroxysmal atrial fibrillation: Secondary | ICD-10-CM | POA: Diagnosis not present

## 2018-01-07 DIAGNOSIS — M450 Ankylosing spondylitis of multiple sites in spine: Secondary | ICD-10-CM | POA: Diagnosis not present

## 2018-01-12 ENCOUNTER — Other Ambulatory Visit: Payer: Self-pay | Admitting: Sports Medicine

## 2018-02-08 DIAGNOSIS — R300 Dysuria: Secondary | ICD-10-CM | POA: Diagnosis not present

## 2018-03-10 DIAGNOSIS — E7849 Other hyperlipidemia: Secondary | ICD-10-CM | POA: Diagnosis not present

## 2018-03-10 DIAGNOSIS — Z125 Encounter for screening for malignant neoplasm of prostate: Secondary | ICD-10-CM | POA: Diagnosis not present

## 2018-03-10 DIAGNOSIS — R82998 Other abnormal findings in urine: Secondary | ICD-10-CM | POA: Diagnosis not present

## 2018-03-17 DIAGNOSIS — N401 Enlarged prostate with lower urinary tract symptoms: Secondary | ICD-10-CM | POA: Diagnosis not present

## 2018-03-17 DIAGNOSIS — M459 Ankylosing spondylitis of unspecified sites in spine: Secondary | ICD-10-CM | POA: Diagnosis not present

## 2018-03-17 DIAGNOSIS — J302 Other seasonal allergic rhinitis: Secondary | ICD-10-CM | POA: Diagnosis not present

## 2018-03-17 DIAGNOSIS — G4733 Obstructive sleep apnea (adult) (pediatric): Secondary | ICD-10-CM | POA: Diagnosis not present

## 2018-03-17 DIAGNOSIS — I480 Paroxysmal atrial fibrillation: Secondary | ICD-10-CM | POA: Diagnosis not present

## 2018-03-17 DIAGNOSIS — I2721 Secondary pulmonary arterial hypertension: Secondary | ICD-10-CM | POA: Diagnosis not present

## 2018-03-17 DIAGNOSIS — G6289 Other specified polyneuropathies: Secondary | ICD-10-CM | POA: Diagnosis not present

## 2018-03-17 DIAGNOSIS — Z23 Encounter for immunization: Secondary | ICD-10-CM | POA: Diagnosis not present

## 2018-03-17 DIAGNOSIS — E7849 Other hyperlipidemia: Secondary | ICD-10-CM | POA: Diagnosis not present

## 2018-03-17 DIAGNOSIS — I341 Nonrheumatic mitral (valve) prolapse: Secondary | ICD-10-CM | POA: Diagnosis not present

## 2018-03-17 DIAGNOSIS — Z6826 Body mass index (BMI) 26.0-26.9, adult: Secondary | ICD-10-CM | POA: Diagnosis not present

## 2018-03-17 DIAGNOSIS — M1990 Unspecified osteoarthritis, unspecified site: Secondary | ICD-10-CM | POA: Diagnosis not present

## 2018-03-17 DIAGNOSIS — Z1389 Encounter for screening for other disorder: Secondary | ICD-10-CM | POA: Diagnosis not present

## 2018-03-18 DIAGNOSIS — Z1212 Encounter for screening for malignant neoplasm of rectum: Secondary | ICD-10-CM | POA: Diagnosis not present

## 2018-04-26 DIAGNOSIS — Z23 Encounter for immunization: Secondary | ICD-10-CM | POA: Diagnosis not present

## 2018-05-24 DIAGNOSIS — D229 Melanocytic nevi, unspecified: Secondary | ICD-10-CM | POA: Diagnosis not present

## 2018-05-24 DIAGNOSIS — L819 Disorder of pigmentation, unspecified: Secondary | ICD-10-CM | POA: Diagnosis not present

## 2018-05-24 DIAGNOSIS — L814 Other melanin hyperpigmentation: Secondary | ICD-10-CM | POA: Diagnosis not present

## 2018-05-24 DIAGNOSIS — L821 Other seborrheic keratosis: Secondary | ICD-10-CM | POA: Diagnosis not present

## 2018-05-24 DIAGNOSIS — L720 Epidermal cyst: Secondary | ICD-10-CM | POA: Diagnosis not present

## 2018-05-24 DIAGNOSIS — B351 Tinea unguium: Secondary | ICD-10-CM | POA: Diagnosis not present

## 2018-06-21 ENCOUNTER — Ambulatory Visit (INDEPENDENT_AMBULATORY_CARE_PROVIDER_SITE_OTHER): Payer: Medicare Other | Admitting: Internal Medicine

## 2018-06-21 ENCOUNTER — Encounter: Payer: Self-pay | Admitting: Internal Medicine

## 2018-06-21 VITALS — BP 114/70 | HR 64 | Ht 76.0 in | Wt 216.6 lb

## 2018-06-21 DIAGNOSIS — I480 Paroxysmal atrial fibrillation: Secondary | ICD-10-CM | POA: Diagnosis not present

## 2018-06-21 DIAGNOSIS — G4733 Obstructive sleep apnea (adult) (pediatric): Secondary | ICD-10-CM

## 2018-06-21 NOTE — Progress Notes (Signed)
06/21/2018-77 year old male former smoker for sleep evaluation.  Last seen in 2011 for OSA. Sleep consult: Last seen by CY in 2011 for OSA. Has been using a CPAP since then. Uses [facility] as his DME. He needs supplies for his machine.     PCP Dr Shon Baton NPSG 1/11-AHI 21.8/hour, d 86%, body weight 230 pounds Epworth score 5 with CPAP Atrial fibrillation in 2011 led to evaluation diagnosing OSA.  He has used CPAP since then. This machine has been all over the world in his travel.  It works very well and he sleep s well. Medical problem list includes AFib/ ablation/ Eliquis, peripheral neuropathy He needs supplies and replacement for an old machine.  We discussed travel CPAP.  Prior to Admission medications   Medication Sig Start Date End Date Taking? Authorizing Provider  Alpha-Lipoic Acid 600 MG CAPS Take 600 mg by mouth daily.   Yes [provider]  apixaban (ELIQUIS) 5 MG TABS tablet Take 1 tablet by mouth twice daily 10/18/17  Yes Crenshaw, Denice Bors, MD  Ascorbic Acid (VITAMIN C) 1000 MG tablet Take 1,000 mg by mouth daily.     Yes [provider]  b complex vitamins capsule Take 1 capsule by mouth daily.     Yes [provider]  Cyanocobalamin 5000 MCG SUBL Place 1 tablet under the tongue daily.    Yes [provider]  gabapentin (NEURONTIN) 300 MG capsule TAKE 1 CAPSULE THREE TIMES A DAY 01/14/18  Yes Stefanie Libel, MD  Loperamide-Simethicone 2-125 MG TABS Take 1 tablet by mouth as needed.    Yes [provider]  meloxicam (MOBIC) 15 MG tablet Take 15 mg by mouth daily as needed for pain.   Yes [provider]  metoprolol succinate (TOPROL-XL) 25 MG 24 hr tablet Take 1 tablet (25 mg total) by mouth daily. 10/18/17  Yes Lelon Perla, MD  Multiple Vitamin (MULTIVITAMIN) tablet Take 1 tablet by mouth daily.     Yes [provider]  NEXIUM 40 MG capsule TAKE 1 CAPSULE DAILY 02/08/15  Yes Darlin Coco, MD  pyridOXINE (VITAMIN B-6)  50 MG tablet Take 50 mg by mouth 2 (two) times daily.   Yes [provider]   Past Medical History:  Diagnosis Date  . A-fib [facility])    cardioversion x2  . Ankylosing spondylitis ([place])   . Atrial flutter ([place])   . GERD (gastroesophageal reflux disease)   . Hemorrhoids   . Irregular heart beat   . MVP (mitral valve prolapse)    WITH A MIDSYSTOLIC CLICK  . OSA (obstructive sleep apnea)    moderate OSA NPSG 10/24/09 - AHO 21.8/hr  . Peripheral neuropathy   . Vitamin B12 deficiency    Past Surgical History:  Procedure Laterality Date  . ABLATION    . COLONOSCOPY    . INGUINAL HERNIA REPAIR    . venous ligation     for varices left lower leg   Family History  Problem Relation Age of Onset  . Prostate cancer Father   . Arthritis Father   . Cancer Father   . Heart failure Mother   . Hypertension Mother    Social History   Socioeconomic History  . Marital status: Married    Spouse name: Not on file  . Number of children: 2  . Years of education: 21  . Highest education level: Not on file  Occupational History  . Occupation: Scientist, clinical (histocompatibility and immunogenetics): [employer]  Needs  . Financial resource strain: Not on file  . Food insecurity:    Worry: Not on file    Inability: Not on file  . Transportation needs:    Medical: Not on file    Non-medical: Not on file  Tobacco Use  . Smoking status: Former Smoker    Last attempt to quit: 07/27/1969    Years since quitting: 48.9  . Smokeless tobacco: Never Used  Substance and Sexual Activity  . Alcohol use: Yes    Comment: 1-2 most days  . Drug use: No  . Sexual activity: Not on file  Lifestyle  . Physical activity:    Days per week: Not on file    Minutes per session: Not on file  . Stress: Not on file  Relationships  . Social connections:    Talks on phone: Not on file    Gets together: Not on file    Attends religious service: Not on file    Active member of club or organization: Not on file    Attends  meetings of clubs or organizations: Not on file    Relationship status: Not on file  . Intimate partner violence:    Fear of current or ex partner: Not on file    Emotionally abused: Not on file    Physically abused: Not on file    Forced sexual activity: Not on file  Other Topics Concern  . Not on file  Social History Narrative  . Not on file   ROS-see HPI   + = positive Constitutional:    weight loss, night sweats, fevers, chills, fatigue, lassitude. HEENT:    headaches, difficulty swallowing, tooth/dental problems, sore throat,       sneezing, itching, ear ache, nasal congestion, post nasal drip, snoring CV:    chest pain, orthopnea, PND, swelling in lower extremities, anasarca,                                  dizziness, palpitations Resp:   shortness of breath with exertion or at rest.                productive cough,   non-productive cough, coughing up of blood.              change in color of mucus.  wheezing.   Skin:    rash or lesions. GI:  No-   heartburn, indigestion, abdominal pain, nausea, vomiting, diarrhea,                 change in bowel habits, loss of appetite GU: dysuria, change in color of urine, no urgency or frequency.   flank pain. MS:   +joint pain, stiffness, decreased range of motion, back pain. Neuro-     nothing unusual Psych:  change in mood or affect.  depression or anxiety.   memory loss.  OBJ- Physical Exam General- Alert, Oriented, Affect-appropriate, Distress- none acute, +tall/lean Skin- rash-none, lesions- none, excoriation- none Lymphadenopathy- none Head- atraumatic            Eyes- Gross vision intact, PERRLA, conjunctivae and secretions clear            Ears- Hearing, canals-normal            Nose- Clear, no-Septal dev, mucus, polyps, erosion, perforation             Throat- Mallampati II , mucosa clear , drainage- none,  tonsils- atrophic Neck- flexible , trachea midline, no stridor , thyroid nl, carotid no bruit Chest - symmetrical  excursion , unlabored           Heart/CV- RRR , no murmur , no gallop  , no rub, nl s1 s2                           - JVD- none , edema- none, stasis changes- none, varices- none           Lung- clear to P&A, wheeze- none, cough- none , dullness-none, rub- none           Chest wall-  Abd-  Br/ Gen/ Rectal- Not done, not indicated Extrem- cyanosis- none, clubbing, none, atrophy- none, strength- nl Neuro- grossly intact to observation

## 2018-06-21 NOTE — Patient Instructions (Signed)
Order- please schedule unattended home sleep test  Dx OSA  Please cal me about 2 weeks after the sleep test for results and recommendations. We will see if CPAP is still indicated, and if so we can get you a new autotitration machine.  Please call if we can help

## 2018-06-26 NOTE — Assessment & Plan Note (Signed)
He has had excellent compliance and control since 2011.  His machine is old and should be replaced.  I discussed this with him and we also discussed travel size CPAP. Plan-schedule home sleep study to update documentation.  If appropriate we will order replacement CPAP to [facility].

## 2018-06-26 NOTE — Assessment & Plan Note (Signed)
Status post ablation.  Exam indicates sinus rhythm at this visit.  He is followed by cardiology.

## 2018-07-01 ENCOUNTER — Other Ambulatory Visit: Payer: Self-pay | Admitting: Sports Medicine

## 2018-07-05 ENCOUNTER — Telehealth: Payer: Self-pay | Admitting: Internal Medicine

## 2018-07-05 NOTE — Telephone Encounter (Signed)
Pt is picking up a machine with me tomorrow 9/11 between 2:30 & 3.

## 2018-07-06 DIAGNOSIS — G4733 Obstructive sleep apnea (adult) (pediatric): Secondary | ICD-10-CM | POA: Diagnosis not present

## 2018-07-07 ENCOUNTER — Other Ambulatory Visit: Payer: Self-pay | Admitting: *Deleted

## 2018-07-07 DIAGNOSIS — G4733 Obstructive sleep apnea (adult) (pediatric): Secondary | ICD-10-CM

## 2018-07-08 DIAGNOSIS — M450 Ankylosing spondylitis of multiple sites in spine: Secondary | ICD-10-CM | POA: Diagnosis not present

## 2018-07-08 DIAGNOSIS — M5136 Other intervertebral disc degeneration, lumbar region: Secondary | ICD-10-CM | POA: Diagnosis not present

## 2018-07-08 DIAGNOSIS — I480 Paroxysmal atrial fibrillation: Secondary | ICD-10-CM | POA: Diagnosis not present

## 2018-07-08 DIAGNOSIS — Z6827 Body mass index (BMI) 27.0-27.9, adult: Secondary | ICD-10-CM | POA: Diagnosis not present

## 2018-07-08 DIAGNOSIS — E663 Overweight: Secondary | ICD-10-CM | POA: Diagnosis not present

## 2018-07-08 DIAGNOSIS — M150 Primary generalized (osteo)arthritis: Secondary | ICD-10-CM | POA: Diagnosis not present

## 2018-07-12 DIAGNOSIS — G4733 Obstructive sleep apnea (adult) (pediatric): Secondary | ICD-10-CM | POA: Diagnosis not present

## 2018-07-22 ENCOUNTER — Telehealth: Payer: Self-pay | Admitting: Internal Medicine

## 2018-07-22 DIAGNOSIS — G4733 Obstructive sleep apnea (adult) (pediatric): Secondary | ICD-10-CM

## 2018-07-22 NOTE — Telephone Encounter (Signed)
Called and spoke with patient regarding results.  Informed the patient of results and recommendations today. Placed order for replacement of old CPAP machine, auto 5-20, mask of choice, humidifier, supplies, airView Pt verbalized understanding and denied any questions or concerns at this time.  Nothing further needed.

## 2018-07-22 NOTE — Telephone Encounter (Signed)
Called and spoke to pt, who is requesting home sleep study results from 07/07/18.   CY please advise. Thanks  Current Outpatient Medications on File Prior to Visit  Medication Sig Dispense Refill  . Alpha-Lipoic Acid 600 MG CAPS Take 600 mg by mouth daily.    Marland Kitchen apixaban (ELIQUIS) 5 MG TABS tablet Take 1 tablet by mouth twice daily 180 tablet 3  . Ascorbic Acid (VITAMIN C) 1000 MG tablet Take 1,000 mg by mouth daily.      Marland Kitchen b complex vitamins capsule Take 1 capsule by mouth daily.      . Cyanocobalamin 5000 MCG SUBL Place 1 tablet under the tongue daily.     Marland Kitchen gabapentin (NEURONTIN) 300 MG capsule TAKE 1 CAPSULE THREE TIMES A DAY 270 capsule 4  . Loperamide-Simethicone 2-125 MG TABS Take 1 tablet by mouth as needed.     . meloxicam (MOBIC) 15 MG tablet Take 15 mg by mouth daily as needed for pain.    . metoprolol succinate (TOPROL-XL) 25 MG 24 hr tablet Take 1 tablet (25 mg total) by mouth daily. 90 tablet 3  . Multiple Vitamin (MULTIVITAMIN) tablet Take 1 tablet by mouth daily.      Marland Kitchen NEXIUM 40 MG capsule TAKE 1 CAPSULE DAILY 90 capsule 3  . pyridOXINE (VITAMIN B-6) 50 MG tablet Take 50 mg by mouth 2 (two) times daily.     No current facility-administered medications on file prior to visit.     No Known Allergies

## 2018-07-22 NOTE — Telephone Encounter (Signed)
Home sleep test showed moderate obstructive sleep apnea, averaging 17 apneas/ hour, with drops in blood oxygen level.  He needs his old machine replaced  Order- [facility] - please replace old CPAP machine, auto 5-20, mask of choice, humidifier, supplies, airView

## 2018-08-15 ENCOUNTER — Telehealth: Payer: Self-pay | Admitting: Internal Medicine

## 2018-08-15 NOTE — Telephone Encounter (Signed)
Spoke with the pt  He disagrees with this response and demands to see Dr Annamaria Boots  There are no openings coming up  Can he be worked in please? Thanks

## 2018-08-15 NOTE — Telephone Encounter (Signed)
Please order DME change auto pressure to 5-15 and ask for a download report to [place] in 1 week

## 2018-08-15 NOTE — Telephone Encounter (Signed)
Spoke with pt. He is aware of CY's response.  Katie - pt would like different dates if at all possible. Thanks.

## 2018-08-15 NOTE — Telephone Encounter (Signed)
Katie- please see when you can get this gentleman in.  Triage- please let Mr Coffin know that we will get my nurse to help get him in with me soon.

## 2018-08-15 NOTE — Telephone Encounter (Signed)
Spoke with the pt  He states he does not like his new cpap machine  It is on a setting of 5-20  He feels like there is too much pressure and causes him to mouth breathe  He states if had a choice he would go back to his old machine at a setting of 9  He liked the humidity feature better on his last machine also  He states he feels like all [facility] cares about is pleasing the insurance company  Please advise thanks

## 2018-08-16 NOTE — Telephone Encounter (Signed)
Spoke with the pt and scheduled appt with CDY for tomorrow at 11:30 am  Pt aware of new office location

## 2018-08-16 NOTE — Telephone Encounter (Signed)
Okay to use any RNA slots on CY's schedule. Thanks .

## 2018-08-17 ENCOUNTER — Ambulatory Visit (INDEPENDENT_AMBULATORY_CARE_PROVIDER_SITE_OTHER): Payer: Medicare Other | Admitting: Internal Medicine

## 2018-08-17 ENCOUNTER — Encounter: Payer: Self-pay | Admitting: Internal Medicine

## 2018-08-17 VITALS — BP 126/78 | HR 78 | Ht 76.0 in | Wt 214.0 lb

## 2018-08-17 DIAGNOSIS — G4733 Obstructive sleep apnea (adult) (pediatric): Secondary | ICD-10-CM | POA: Diagnosis not present

## 2018-08-17 DIAGNOSIS — I480 Paroxysmal atrial fibrillation: Secondary | ICD-10-CM

## 2018-08-17 NOTE — Patient Instructions (Signed)
Order- [facility]- please change CPAP to fixed 9 cwp, continue mask of choice, humidifier, supplies, AirView  Please let me know how you feel about the mask with your new settings.

## 2018-08-17 NOTE — Progress Notes (Signed)
HPI  male former smoker followed for OSA, complicated by A. fib/ablation/Eliquis, peripheral neuropathy NPSG 10/24/09-AHI 21.8/hour, desat to 86%, body weight 230 pounds HST-07/06/2018-AHI 17.5/hour, desaturation to 82%, body weight 216 pound  -----------------------------------------------------------------------------------------------  06/21/2018-78 year old male former smoker for sleep evaluation.  Last seen in 2011 for OSA. Sleep consult: Last seen by CY in 2011 for OSA. Has been using a CPAP since then. Uses [facility] as his DME. He needs supplies for his machine.     PCP Dr Shon Baton NPSG 1/11-AHI 21.8/hour, d 86%, body weight 230 pounds Epworth score 5 with CPAP Atrial fibrillation in 2011 led to evaluation diagnosing OSA.  He has used CPAP since then. This machine has been all over the world in his travel.  It works very well and he sleep s well. Medical problem list includes AFib/ ablation/ Eliquis, peripheral neuropathy He needs supplies and replacement for an old machine.  We discussed travel CPAP.  08/17/2018-78 year old male former smoker followed for OSA, complicated by A. fib/ablation/Eliquis, peripheral neuropathy Replaced machine 07/22/2018-auto 5-20/[facility] Body weight 214 lbs HST-07/06/2018-AHI 17.5/hour, desaturation to 82%, body weight 216 pounds He felt the auto pressure 5-20 was too high and causing mouth breathing.  He preferred his old machine pressure 9 and also like the humidifier on that machine better. We had ordered pressure change to 5-15 and requested a download on that but he he disagreed with that response and asked for an office visit. Download old machine 05/19/2018-08/16/2018: Compliance 87% AHI 4.6/hour, pressure 9. Download new machine with regular use just over the last 9 days meeting compliance in that interval with AHI 5.3/hour, AutoSet 5-20.>> today to fixed 9 cwp Mask is uncomfortable- discussed mask and pressure, compliance goals.  ROS-see HPI   + =  positive Constitutional:    weight loss, night sweats, fevers, chills, fatigue, lassitude. HEENT:    headaches, difficulty swallowing, tooth/dental problems, sore throat,       sneezing, itching, ear ache, nasal congestion, post nasal drip, snoring CV:    chest pain, orthopnea, PND, swelling in lower extremities, anasarca,                                  dizziness, palpitations Resp:   shortness of breath with exertion or at rest.                productive cough,   non-productive cough, coughing up of blood.              change in color of mucus.  wheezing.   Skin:    rash or lesions. GI:  No-   heartburn, indigestion, abdominal pain, nausea, vomiting, diarrhea,                 change in bowel habits, loss of appetite GU: dysuria, change in color of urine, no urgency or frequency.   flank pain. MS:   +joint pain, stiffness, decreased range of motion, back pain. Neuro-     nothing unusual Psych:  change in mood or affect.  depression or anxiety.   memory loss.  OBJ- Physical Exam General- Alert, Oriented, Affect-appropriate, Distress- none acute, +tall/lean Skin- rash-none, lesions- none, excoriation- none Lymphadenopathy- none Head- atraumatic            Eyes- Gross vision intact, PERRLA, conjunctivae and secretions clear            Ears- Hearing, canals-normal  Nose- Clear, no-Septal dev, mucus, polyps, erosion, perforation             Throat- Mallampati II , mucosa clear , drainage- none, tonsils- atrophic Neck- flexible , trachea midline, no stridor , thyroid nl, carotid no bruit Chest - symmetrical excursion , unlabored           Heart/CV- RRR , no murmur , no gallop  , no rub, nl s1 s2                           - JVD- none , edema- none, stasis changes- none, varices- none           Lung- clear to P&A, wheeze- none, cough- none , dullness-none, rub- none           Chest wall-  Abd-  Br/ Gen/ Rectal- Not done, not indicated Extrem- cyanosis- none, clubbing, none,  atrophy- none, strength- nl Neuro- grossly intact to observation

## 2018-08-22 ENCOUNTER — Telehealth: Payer: Self-pay | Admitting: Internal Medicine

## 2018-08-22 DIAGNOSIS — G4733 Obstructive sleep apnea (adult) (pediatric): Secondary | ICD-10-CM

## 2018-08-22 NOTE — Telephone Encounter (Signed)
Called pt and advised message from the provider. Pt understood and verbalized understanding. Nothing further is needed.   Order placed to DME

## 2018-08-22 NOTE — Telephone Encounter (Signed)
Called and spoke with Patient.  He is requesting a pressure change on his CPAP.  He stated that the app is showing he is having 12-15 events per night.  He called [facility], and they recommended possible pressure change to 8-12cm.  He is going to go back to the nasal mask tonight, instead of full face, to see if that makes a difference.  Dr. Annamaria Boots, please advise on CPAP settings  No Known Allergies  Current Outpatient Medications on File Prior to Visit  Medication Sig Dispense Refill  . Alpha-Lipoic Acid 600 MG CAPS Take 600 mg by mouth daily.    Marland Kitchen apixaban (ELIQUIS) 5 MG TABS tablet Take 1 tablet by mouth twice daily 180 tablet 3  . Ascorbic Acid (VITAMIN C) 1000 MG tablet Take 1,000 mg by mouth daily.      Marland Kitchen b complex vitamins capsule Take 1 capsule by mouth daily.      . Cyanocobalamin 5000 MCG SUBL Place 1 tablet under the tongue daily.     Marland Kitchen gabapentin (NEURONTIN) 300 MG capsule TAKE 1 CAPSULE THREE TIMES A DAY 270 capsule 4  . Loperamide-Simethicone 2-125 MG TABS Take 1 tablet by mouth as needed.     . meloxicam (MOBIC) 15 MG tablet Take 15 mg by mouth daily as needed for pain.    . metoprolol succinate (TOPROL-XL) 25 MG 24 hr tablet Take 1 tablet (25 mg total) by mouth daily. 90 tablet 3  . Multiple Vitamin (MULTIVITAMIN) tablet Take 1 tablet by mouth daily.      Marland Kitchen NEXIUM 40 MG capsule TAKE 1 CAPSULE DAILY 90 capsule 3  . pyridOXINE (VITAMIN B-6) 50 MG tablet Take 50 mg by mouth 2 (two) times daily.     No current facility-administered medications on file prior to visit.

## 2018-08-22 NOTE — Telephone Encounter (Signed)
Ok please order DME change CPAP to auto 8-12

## 2018-08-26 ENCOUNTER — Telehealth: Payer: Self-pay | Admitting: Internal Medicine

## 2018-08-26 DIAGNOSIS — G4733 Obstructive sleep apnea (adult) (pediatric): Secondary | ICD-10-CM

## 2018-08-26 NOTE — Telephone Encounter (Signed)
Patient returning phone call.  Patient phone number is [phone].  Can be reached at 12:15 pm.

## 2018-08-26 NOTE — Telephone Encounter (Signed)
Spoke with patient. Made him aware order an be placed for sleep center. Patient stated he has had is current mask for years. Will be contacted by [facility] with appointment details. Nothing further needed at this time.

## 2018-08-26 NOTE — Telephone Encounter (Signed)
Patient is requesting an appt with the sleep center about fitting his mask.  States mask is keeping him awake.  He states he feels like it is leaking.  He states he has not had any success with [facility]. CB is [phone].

## 2018-08-26 NOTE — Telephone Encounter (Signed)
LMTCB at number given in message.

## 2018-08-26 NOTE — Telephone Encounter (Signed)
LVMTCB x 1 for patient Requested more detail regarding mask fit. Also need to know what type of mask he is using with CPAP.

## 2018-08-30 ENCOUNTER — Ambulatory Visit (HOSPITAL_BASED_OUTPATIENT_CLINIC_OR_DEPARTMENT_OTHER): Payer: Medicare Other | Attending: Internal Medicine | Admitting: Radiology

## 2018-08-30 DIAGNOSIS — G4733 Obstructive sleep apnea (adult) (pediatric): Secondary | ICD-10-CM

## 2018-09-07 ENCOUNTER — Other Ambulatory Visit (HOSPITAL_BASED_OUTPATIENT_CLINIC_OR_DEPARTMENT_OTHER): Payer: Medicare Other

## 2018-09-12 ENCOUNTER — Other Ambulatory Visit (HOSPITAL_BASED_OUTPATIENT_CLINIC_OR_DEPARTMENT_OTHER): Payer: Medicare Other

## 2018-09-29 ENCOUNTER — Encounter: Payer: Self-pay | Admitting: Pulmonary Disease

## 2018-09-29 ENCOUNTER — Ambulatory Visit (INDEPENDENT_AMBULATORY_CARE_PROVIDER_SITE_OTHER): Payer: Medicare Other | Admitting: Pulmonary Disease

## 2018-09-29 VITALS — BP 114/72 | HR 61 | Ht 74.0 in | Wt 216.6 lb

## 2018-09-29 DIAGNOSIS — G4733 Obstructive sleep apnea (adult) (pediatric): Secondary | ICD-10-CM

## 2018-09-29 NOTE — Assessment & Plan Note (Signed)
Assessment: December/2019 home sleep study showing AHI of 17.5 an hour CPAP compliance report today showing excellent compliance, AHI of 6.3, pressure settings 95th percentile 11.9, maximum 12 Patient reports that he feels that since the mask fitting he has had less leaks  Plan: Continue CPAP therapy at this time We will request 1 month download Will increase pressure settings to APAP 8-15 Patient to contact our office if this is too much pressure for him Follow-up in 3 months with Dr. Annamaria Boots Continue to work on diet and exercise and avoiding alcohol

## 2018-09-29 NOTE — Patient Instructions (Addendum)
DME: [facility]  Settings: 8-15   One month download ordered from [facility]   We recommend that you continue using your CPAP daily >>>Keep up the hard work using your device >>> Goal should be wearing this for the entire night that you are sleeping, at least 4 to 6 hours  Remember:  . Do not drive or operate heavy machinery if tired or drowsy.  . Please notify the supply company and office if you are unable to use your device regularly due to missing supplies or machine being broken.  . Work on maintaining a healthy weight and following your recommended nutrition plan  . Maintain proper daily exercise and movement  . Maintaining proper use of your device can also help improve management of other chronic illnesses such as: Blood pressure, blood sugars, and weight management.   BiPAP/ CPAP Cleaning:  >>>Clean weekly, with Dawn soap, and bottle brush.  Set up to air dry.   Follow up in 3 months with Dr. Annamaria Boots    It is flu season:   >>> Best ways to protect herself from the flu: Receive the yearly flu vaccine, practice good hand hygiene washing with soap and also using hand sanitizer when available, eat a nutritious meals, get adequate rest, hydrate appropriately   Please contact the office if your symptoms worsen or you have concerns that you are not improving.   Thank you for choosing [facility] for your healthcare, and for allowing [place] to partner with you on your healthcare journey. I am thankful to be able to provide care to you today.   Wyn Quaker FNP-C

## 2018-09-29 NOTE — Progress Notes (Signed)
@Patient  ID: Wesley Chris., male    DOB: 1941-07-10, 78 y.o.   MRN: [id number]  Chief Complaint  Patient presents with  . Follow-up    CPAP follow up - doing good    Referring provider: Shon Baton, MD  HPI:  78 year old male former smoker followed in our office for obstructive sleep apnea  PMH: A. fib, status post ablation, maintained on Eliquis, peripheral neuropathy Smoker/ Smoking History: Former smoker Maintenance: None Pt of: Dr. Annamaria Boots  09/29/2018  - Visit   78 year old male former smoker presenting to our office today for a follow-up regarding his obstructive sleep apnea.  Patient was last seen in our office in January/2020.  He has had some ongoing changes regarding his pressure settings of his CPAP.  Patient was also seen in the sleep lab for a mask desensitization with burning.  That was on 08/30/2018.    CPAP compliance report showing excellent compliance.  Compliance report listed below:  CPAP compliance report-30 in the last 30 days use, 29 of those days greater than 4 hours, average usage 6 hours and 44 minutes, APAP pressure settings of 8-12, AHI 6.3  Patient reports has been doing well since mask fitting.  He still has apprehensions regarding why he is having some after starting a new CPAP machine.  He still is unsure why he is having to have the pressure setting change as well as the mask setting changes as he reports that when he initially started CPAP therapy he did not need to have any of these interventions for 4 to 5 years.  He is a little frustrated by that.   Tests:   NPSG 10/24/09-AHI 21.8/hour, desat to 86%, body weight 230 pounds  HST-07/06/2018-AHI 17.5/hour, desaturation to 82%, body weight 216 pound  08/30/2018- CPAP mask fitting desensitization- patient was desensitized to the CPAP device with a pressure of 12 cm H2O with the following mask: ResMed F 30 (medium) and AutoNation (medium with large headgear).  Patient chose DreamWear  as is first choice.  Both mask were provided for patient for home use.  FENO:  No results found for: NITRICOXIDE  PFT: No flowsheet data found.  Imaging: No results found.    Specialty Problems      Pulmonary Problems   OBSTRUCTIVE SLEEP APNEA    NPSG 1/11-AHI 21.8/hour, d 86%, body weight 230 pounds HST-07/06/2018-AHI 17.5/hour, desaturation to 82%, body weight 216 pound  08/30/2018- CPAP mask fitting desensitization- patient was desensitized to the CPAP device with a pressure of 12 cm H2O with the following mask: ResMed F 30 (medium) and AutoNation (medium with large headgear).  Patient chose DreamWear as is first choice.  Both mask were provided for patient for home use.         No Known Allergies  Immunization History  Administered Date(s) Administered  . Influenza Split 06/05/2011  . Influenza Whole 04/17/2010  . Influenza, High Dose Seasonal PF 04/26/2018  . Influenza-Unspecified 05/08/2009, 05/23/2015  . Zoster Recombinat (Shingrix) 09/09/2017    Past Medical History:  Diagnosis Date  . A-fib [facility])    cardioversion x2  . Ankylosing spondylitis ([place])   . Atrial flutter ([place])   . GERD (gastroesophageal reflux disease)   . Hemorrhoids   . Irregular heart beat   . MVP (mitral valve prolapse)    WITH A MIDSYSTOLIC CLICK  . OSA (obstructive sleep apnea)    moderate OSA NPSG 10/24/09 - AHO 21.8/hr  . Peripheral neuropathy   .  Vitamin B12 deficiency     Tobacco History: Social History   Tobacco Use  Smoking Status Former Smoker  . Last attempt to quit: 07/27/1969  . Years since quitting: 49.2  Smokeless Tobacco Never Used   Counseling given: Not Answered  Continue to not smoke  Outpatient Encounter Medications as of 09/29/2018  Medication Sig  . Alpha-Lipoic Acid 600 MG CAPS Take 600 mg by mouth daily.  Marland Kitchen apixaban (ELIQUIS) 5 MG TABS tablet Take 1 tablet by mouth twice daily  . Ascorbic Acid (VITAMIN C) 1000 MG tablet Take 1,000 mg by  mouth daily.    Marland Kitchen b complex vitamins capsule Take 1 capsule by mouth daily.    . Cyanocobalamin 5000 MCG SUBL Place 1 tablet under the tongue daily.   Marland Kitchen gabapentin (NEURONTIN) 300 MG capsule TAKE 1 CAPSULE THREE TIMES A DAY  . Loperamide-Simethicone 2-125 MG TABS Take 1 tablet by mouth as needed.   . meloxicam (MOBIC) 15 MG tablet Take 15 mg by mouth daily as needed for pain.  . metoprolol succinate (TOPROL-XL) 25 MG 24 hr tablet Take 1 tablet (25 mg total) by mouth daily.  . Multiple Vitamin (MULTIVITAMIN) tablet Take 1 tablet by mouth daily.    Marland Kitchen NEXIUM 40 MG capsule TAKE 1 CAPSULE DAILY  . pyridOXINE (VITAMIN B-6) 50 MG tablet Take 50 mg by mouth 2 (two) times daily.  . rosuvastatin (CRESTOR) 5 MG tablet Take 5 mg by mouth daily.   No facility-administered encounter medications on file as of 09/29/2018.      Review of Systems  Review of Systems  Constitutional: Negative for activity change, chills, fatigue, fever and unexpected weight change.  HENT: Negative for sinus pressure, sinus pain, sneezing and sore throat.   Eyes: Negative.   Respiratory: Negative for cough, shortness of breath and wheezing.   Cardiovascular: Negative for chest pain and palpitations.  Gastrointestinal: Negative for constipation, diarrhea, nausea and vomiting.  Endocrine: Negative.   Musculoskeletal: Negative.   Skin: Negative.   Neurological: Negative for dizziness and headaches.  Psychiatric/Behavioral: Negative.  Negative for dysphoric mood. The patient is not nervous/anxious.   All other systems reviewed and are negative.    Physical Exam  BP 114/72 (BP Location: Right Arm, Patient Position: Sitting, Cuff Size: Normal)   Pulse 61   Ht 6\' 2"  (1.88 m)   Wt 216 lb 9.6 oz (98.2 kg)   SpO2 97%   BMI 27.81 kg/m   Wt Readings from Last 5 Encounters:  09/29/18 216 lb 9.6 oz (98.2 kg)  08/17/18 214 lb (97.1 kg)  06/21/18 216 lb 9.6 oz (98.2 kg)  10/28/17 208 lb (94.3 kg)  10/18/17 212 lb 3.2 oz  (96.3 kg)     Physical Exam  Constitutional: He is oriented to person, place, and time and well-developed, well-nourished, and in no distress. No distress.  HENT:  Head: Normocephalic and atraumatic.  Right Ear: Hearing, tympanic membrane, external ear and ear canal normal.  Left Ear: Hearing, tympanic membrane, external ear and ear canal normal.  Nose: Nose normal. Right sinus exhibits no maxillary sinus tenderness and no frontal sinus tenderness. Left sinus exhibits no maxillary sinus tenderness and no frontal sinus tenderness.  Mouth/Throat: Uvula is midline and oropharynx is clear and moist. No oropharyngeal exudate.  Eyes: Pupils are equal, round, and reactive to light.  Neck: Normal range of motion. Neck supple.  Cardiovascular: Normal rate, regular rhythm and normal heart sounds.  Pulmonary/Chest: Effort normal and breath sounds normal. No  accessory muscle usage. No respiratory distress. He has no decreased breath sounds. He has no wheezes. He has no rhonchi.  Neurological: He is alert and oriented to person, place, and time. Gait normal.  Skin: Skin is warm and dry. He is not diaphoretic. No erythema.  Psychiatric: Mood, memory, affect and judgment normal.  Nursing note and vitals reviewed.     Lab Results:  CBC No results found for: WBC, RBC, HGB, HCT, PLT, MCV, MCH, MCHC, RDW, LYMPHSABS, MONOABS, EOSABS, BASOSABS  BMET    Component Value Date/Time   NA 140 06/20/2013 0856   K 3.9 06/20/2013 0856   CL 106 06/20/2013 0856   CO2 27 06/20/2013 0856   GLUCOSE 92 06/20/2013 0856   BUN 14 06/20/2013 0856   CREATININE 0.8 06/20/2013 0856   CALCIUM 9.4 06/20/2013 0856    BNP No results found for: BNP  ProBNP No results found for: PROBNP    Assessment & Plan:     OBSTRUCTIVE SLEEP APNEA Assessment: December/2019 home sleep study showing AHI of 17.5 an hour CPAP compliance report today showing excellent compliance, AHI of 6.3, pressure settings 95th percentile  11.9, maximum 12 Patient reports that he feels that since the mask fitting he has had less leaks  Plan: Continue CPAP therapy at this time We will request 1 month download Will increase pressure settings to APAP 8-15 Patient to contact our office if this is too much pressure for him Follow-up in 3 months with Dr. Annamaria Boots Continue to work on diet and exercise and avoiding alcohol     Lauraine Rinne, NP 09/29/2018   This appointment was 34 min long with over 50% of the time in direct face-to-face patient care, assessment, plan of care, and follow-up.

## 2018-09-30 ENCOUNTER — Other Ambulatory Visit: Payer: Self-pay | Admitting: Cardiology

## 2018-10-03 ENCOUNTER — Telehealth: Payer: Self-pay | Admitting: Pulmonary Disease

## 2018-10-03 NOTE — Telephone Encounter (Signed)
Elon Alas sent to Harland Dingwall, Melissa; [place], Jeanie Cooks, Clinton Gallant, [place], I will have to check on this...there is a backlog on orders getting entered.

## 2018-10-03 NOTE — Telephone Encounter (Signed)
I called & provided an update to the patient.  Pt verbalized understanding.

## 2018-10-03 NOTE — Telephone Encounter (Signed)
LM to have called returned as there was a 26 minute wait w/ [employer].  Also sent a high priority CM to [employer] to get an update.

## 2018-10-03 NOTE — Telephone Encounter (Signed)
Note   Confirmation received from [place].        Type Date User Summary Attachment  General 09/29/2018 4:52 PM Ilona Sorrel - -  Note   Message sent to [facility].        Type Date User Summary Attachment  General 09/29/2018 12:15 PM Ilona Sorrel - -  Note   Holding for signature.        Type Date User Summary Attachment  Provider Comments 09/29/2018 12:10 PM Rosana Berger, CMA Provider Comments -  Note   Please send order to [facility] for CPAP settings to be increased from 8-12 to 8-15  [place]         ___________________________________________  Hulen Skains and spoke with patient, advised him that the order was placed and confirmed by [place]. Patient stated that [facility] told him they never received it. [facility] can you help with this. Thank you.

## 2018-10-04 NOTE — Telephone Encounter (Signed)
Will close encounter. Nothing further needed. [facility] will contact patient.

## 2018-10-24 NOTE — Assessment & Plan Note (Signed)
Discussed mask and comfort options He benefits from CPAP, but needs desensitization/ mask fitting referral, also change auto machine to fixed 9 cwp.

## 2018-10-24 NOTE — Assessment & Plan Note (Signed)
Rhythm at this visit c/w NSR. He doesn't necessarily feel rhythm changes.

## 2018-11-09 ENCOUNTER — Encounter: Payer: Self-pay | Admitting: Internal Medicine

## 2018-11-10 ENCOUNTER — Other Ambulatory Visit: Payer: Self-pay

## 2018-11-10 ENCOUNTER — Ambulatory Visit (INDEPENDENT_AMBULATORY_CARE_PROVIDER_SITE_OTHER): Payer: Medicare Other | Admitting: Internal Medicine

## 2018-11-10 DIAGNOSIS — G4733 Obstructive sleep apnea (adult) (pediatric): Secondary | ICD-10-CM | POA: Diagnosis not present

## 2018-11-10 NOTE — Patient Instructions (Signed)
I suggest we continue CPAP auto 8-15. You can adjust your masks and chin straps or other measures to help keep your mouth closed as you find most comfortable.  Please call if we can help

## 2018-11-10 NOTE — Progress Notes (Signed)
HPI  male former smoker followed for OSA, complicated by A. fib/ablation/Eliquis, peripheral neuropathy NPSG 10/24/09-AHI 21.8/hour, desat to 86%, body weight 230 pounds HST-07/06/2018-AHI 17.5/hour, desaturation to 82%, body weight 216 pound  ----------------------------------------------------------------------------------------------- 08/17/2018-78 year old male former smoker followed for OSA, complicated by A. fib/ablation/Eliquis, peripheral neuropathy Replaced machine 07/22/2018-auto 5-20/[facility] Body weight 214 lbs HST-07/06/2018-AHI 17.5/hour, desaturation to 82%, body weight 216 pounds He felt the auto pressure 5-20 was too high and causing mouth breathing.  He preferred his old machine pressure 9 and also like the humidifier on that machine better. We had ordered pressure change to 5-15 and requested a download on that but he he disagreed with that response and asked for an office visit. Download old machine 05/19/2018-08/16/2018: Compliance 87% AHI 4.6/hour, pressure 9. Download new machine with regular use just over the last 9 days meeting compliance in that interval with AHI 5.3/hour, AutoSet 5-20.>> today to fixed 9 cwp Mask is uncomfortable- discussed mask and pressure, compliance goals.  ROS-see HPI   + = positive Constitutional:    weight loss, night sweats, fevers, chills, fatigue, lassitude. HEENT:    headaches, difficulty swallowing, tooth/dental problems, sore throat,       sneezing, itching, ear ache, nasal congestion, post nasal drip, snoring CV:    chest pain, orthopnea, PND, swelling in lower extremities, anasarca,                                  dizziness, palpitations Resp:   shortness of breath with exertion or at rest.                productive cough,   non-productive cough, coughing up of blood.              change in color of mucus.  wheezing.   Skin:    rash or lesions. GI:  No-   heartburn, indigestion, abdominal pain, nausea, vomiting, diarrhea,        change in bowel habits, loss of appetite GU: dysuria, change in color of urine, no urgency or frequency.   flank pain. MS:   +joint pain, stiffness, decreased range of motion, back pain. Neuro-     nothing unusual Psych:  change in mood or affect.  depression or anxiety.   memory loss.  OBJ- Physical Exam General- Alert, Oriented, Affect-appropriate, Distress- none acute, +tall/lean Skin- rash-none, lesions- none, excoriation- none Lymphadenopathy- none Head- atraumatic            Eyes- Gross vision intact, PERRLA, conjunctivae and secretions clear            Ears- Hearing, canals-normal            Nose- Clear, no-Septal dev, mucus, polyps, erosion, perforation             Throat- Mallampati II , mucosa clear , drainage- none, tonsils- atrophic Neck- flexible , trachea midline, no stridor , thyroid nl, carotid no bruit Chest - symmetrical excursion , unlabored           Heart/CV- RRR , no murmur , no gallop  , no rub, nl s1 s2                           - JVD- none , edema- none, stasis changes- none, varices- none           Lung- clear to P&A, wheeze- none, cough- none ,  dullness-none, rub- none           Chest wall-  Abd-  Br/ Gen/ Rectal- Not done, not indicated Extrem- cyanosis- none, clubbing, none, atrophy- none, strength- nl Neuro- grossly intact to observation   11/10/2018- Virtual Visit via Telephone Note  I connected with Traeson R Sonnie Alamo. on 11/10/18 at  2:30 PM EDT by telephone and verified that I am speaking with the correct person using two identifiers.   I discussed the limitations, risks, security and privacy concerns of performing an evaluation and management service by telephone and the availability of in person appointments. I also discussed with the patient that there may be a patient responsible charge related to this service. The patient expressed understanding and agreed to proceed.   History of Present Illness: 20 yoM male former smoker followed for OSA,  complicated by A. fib/ablation/Eliquis, peripheral neuropathy His original CPAP was 9 and worked for years. Following reassessment in December he has needed several pressure changes and mask changes with some frustration. On 2/4 he had CPAP desensitization at [facility]. CPAP follow up, full face mask is comfortable, but feels the pressure is worse, sleeps well at night, feels he is losing his voice while using full face mask, DME [facility]  Mouth breathing now with full face mask. Admits this and pollen seasson may be making him hoarse.  Walks outside a lot.    Observations/Objective: CPAP auto 8-15/ [facility] Download 100% compliance, AHI 7.2/ hr (mostly centrals)   Assessment and Plan: OSA- Good compliance. Still working on comfort issues.   Follow Up Instructions: 4 months   I discussed the assessment and treatment plan with the patient. The patient was provided an opportunity to ask questions and all were answered. The patient agreed with the plan and demonstrated an understanding of the instructions.   The patient was advised to call back or seek an in-person evaluation if the symptoms worsen or if the condition fails to improve as anticipated.  I provided 24 minutes of non-face-to-face time during this encounter.   Baird Lyons, MD

## 2018-11-14 ENCOUNTER — Encounter: Payer: Self-pay | Admitting: Internal Medicine

## 2018-11-14 NOTE — Assessment & Plan Note (Signed)
He continues to benefit from CPAP, but has never regained the snse of comfort he had with original machine, even though we did try going back to fixed pressure at 9 as before.  He is going to work on ways to keep mouth closed to see if that helps hoarseness. Plan- auto 8-15, chin strap or mouth tape

## 2018-11-25 ENCOUNTER — Ambulatory Visit: Payer: Medicare Other | Admitting: Cardiology

## 2019-01-03 ENCOUNTER — Ambulatory Visit: Payer: Medicare Other | Admitting: Internal Medicine

## 2019-01-04 ENCOUNTER — Other Ambulatory Visit: Payer: Self-pay | Admitting: Cardiology

## 2019-01-04 DIAGNOSIS — I4891 Unspecified atrial fibrillation: Secondary | ICD-10-CM

## 2019-01-06 DIAGNOSIS — M150 Primary generalized (osteo)arthritis: Secondary | ICD-10-CM | POA: Diagnosis not present

## 2019-01-06 DIAGNOSIS — M5136 Other intervertebral disc degeneration, lumbar region: Secondary | ICD-10-CM | POA: Diagnosis not present

## 2019-01-06 DIAGNOSIS — I480 Paroxysmal atrial fibrillation: Secondary | ICD-10-CM | POA: Diagnosis not present

## 2019-01-06 DIAGNOSIS — M450 Ankylosing spondylitis of multiple sites in spine: Secondary | ICD-10-CM | POA: Diagnosis not present

## 2019-01-24 DIAGNOSIS — I480 Paroxysmal atrial fibrillation: Secondary | ICD-10-CM | POA: Diagnosis not present

## 2019-01-24 DIAGNOSIS — Z7901 Long term (current) use of anticoagulants: Secondary | ICD-10-CM | POA: Diagnosis not present

## 2019-01-24 DIAGNOSIS — Z9889 Other specified postprocedural states: Secondary | ICD-10-CM | POA: Diagnosis not present

## 2019-01-24 DIAGNOSIS — Z8679 Personal history of other diseases of the circulatory system: Secondary | ICD-10-CM | POA: Diagnosis not present

## 2019-01-30 DIAGNOSIS — H25811 Combined forms of age-related cataract, right eye: Secondary | ICD-10-CM | POA: Diagnosis not present

## 2019-01-30 DIAGNOSIS — H40013 Open angle with borderline findings, low risk, bilateral: Secondary | ICD-10-CM | POA: Diagnosis not present

## 2019-01-30 DIAGNOSIS — H2512 Age-related nuclear cataract, left eye: Secondary | ICD-10-CM | POA: Diagnosis not present

## 2019-01-30 DIAGNOSIS — H353112 Nonexudative age-related macular degeneration, right eye, intermediate dry stage: Secondary | ICD-10-CM | POA: Diagnosis not present

## 2019-02-01 ENCOUNTER — Telehealth: Payer: Self-pay | Admitting: Physician Assistant

## 2019-02-01 NOTE — Telephone Encounter (Signed)

## 2019-02-02 ENCOUNTER — Ambulatory Visit (INDEPENDENT_AMBULATORY_CARE_PROVIDER_SITE_OTHER): Payer: Medicare Other | Admitting: Physician Assistant

## 2019-02-02 ENCOUNTER — Other Ambulatory Visit: Payer: Self-pay

## 2019-02-02 ENCOUNTER — Encounter: Payer: Self-pay | Admitting: Physician Assistant

## 2019-02-02 VITALS — BP 128/72 | HR 60 | Ht 74.0 in | Wt 215.0 lb

## 2019-02-02 DIAGNOSIS — R002 Palpitations: Secondary | ICD-10-CM

## 2019-02-02 DIAGNOSIS — I480 Paroxysmal atrial fibrillation: Secondary | ICD-10-CM

## 2019-02-02 DIAGNOSIS — I361 Nonrheumatic tricuspid (valve) insufficiency: Secondary | ICD-10-CM | POA: Diagnosis not present

## 2019-02-02 DIAGNOSIS — I341 Nonrheumatic mitral (valve) prolapse: Secondary | ICD-10-CM | POA: Diagnosis not present

## 2019-02-02 NOTE — Progress Notes (Signed)
Cardiology Clinic Note   Patient Name: Wesley [name]. Date of Encounter: 02/02/2019  Primary Care Provider:  Shon Baton, MD Primary Cardiologist:  Kirk Ruths, MD  Patient Profile    Wesley [name]. Wesley [name].  78 year old male presents for follow-up for his paroxysmal atrial fibrillation. Past Medical History    Past Medical History:  Diagnosis Date   A-fib [facility])    cardioversion x2   Ankylosing spondylitis (HCC)    Atrial flutter (HCC)    GERD (gastroesophageal reflux disease)    Hemorrhoids    Irregular heart beat    MVP (mitral valve prolapse)    WITH A MIDSYSTOLIC CLICK   OSA (obstructive sleep apnea)    moderate OSA NPSG 10/24/09 - AHO 21.8/hr   Peripheral neuropathy    Vitamin B12 deficiency    Past Surgical History:  Procedure Laterality Date   ABLATION     COLONOSCOPY     INGUINAL HERNIA REPAIR     venous ligation     for varices left lower leg    Allergies  No Known Allergies  History of Present Illness    Wesley [name] was last seen by Dr. Stanford Breed 10/18/2017.  During that visit he was well and maintained sinus rhythm.  On March 2006 had a normal nuclear study.  He had a successful A. fib ablation on 02/19/2010 in [place].  He continues to be followed there.  However, he has had intermittent infrequent episodes of proximal atrial fibrillation since his ablation and has been treated medically.  An abdominal ultrasound in March 2017 showed no aneurysm.  Echocardiogram April 2019 showed normal LV function, mildly dilated aortic root, trivial mitral prolapse, mild to moderate right ventricle enlargement and moderate tricuspid regurgitation.  He seen in the clinic today and states that he feels he is back in atrial fibrillation.  He has been using his Kardiamobile monitor which is been indicating possible atrial fibrillation.  He states that when he feels his pulse he also feels extra beats.  However, he states he has not felt bad or  stopped any of his normal daily activities.  His EKG today shows sinus rhythm with sinus arrhythmia, PACs, and a first-degree AV block.  He states he is still followed at [facility] for his atrial fibrillation by Dr. Rolland Porter.  He denies chest pain, shortness of breath, lower extremity edema, melena, hematuria, hemoptysis, weakness, fatigue, orthopnea and PND.  Home Medications    Prior to Admission medications   Medication Sig Start Date End Date Taking? Authorizing Provider  Alpha-Lipoic Acid 600 MG CAPS Take 600 mg by mouth daily.    [provider]  Ascorbic Acid (VITAMIN C) 1000 MG tablet Take 1,000 mg by mouth daily.      [provider]  b complex vitamins capsule Take 1 capsule by mouth daily.      [provider]  Cyanocobalamin 5000 MCG SUBL Place 1 tablet under the tongue daily.     [provider]  ELIQUIS 5 MG TABS tablet TAKE 1 TABLET TWICE A DAY 09/30/18   Lelon Perla, MD  gabapentin (NEURONTIN) 300 MG capsule TAKE 1 CAPSULE THREE TIMES A DAY 07/04/18   Stefanie Libel, MD  Loperamide-Simethicone 2-125 MG TABS Take 1 tablet by mouth as needed.     [provider]  meloxicam (MOBIC) 15 MG tablet Take 15 mg by mouth daily as needed for pain.    [provider]  Multiple Vitamin (MULTIVITAMIN) tablet  Take 1 tablet by mouth daily.      [provider]  NEXIUM 40 MG capsule TAKE 1 CAPSULE DAILY 02/08/15   Darlin Coco, MD  pyridOXINE (VITAMIN B-6) 50 MG tablet Take 50 mg by mouth 2 (two) times daily.    [provider]  rosuvastatin (CRESTOR) 5 MG tablet Take 5 mg by mouth daily. Taking 1/2 tab every other day    [provider]  TOPROL XL 25 MG 24 hr tablet TAKE 1 TABLET DAILY 01/04/19   Stanford Breed Denice Bors, MD    Family History    Family History  Problem Relation Age of Onset   Prostate cancer Father    Arthritis Father    Cancer Father    Heart failure Mother    Hypertension Mother    He  indicated that his mother is deceased. He indicated that his father is deceased. He indicated that his sister is alive. He indicated that his brother is alive. He indicated that his maternal grandmother is deceased. He indicated that his maternal grandfather is deceased. He indicated that his paternal grandmother is deceased. He indicated that his paternal grandfather is deceased.  Social History    Social History   Socioeconomic History   Marital status: Married    Spouse name: Not on file   Number of children: 2   Years of education: 18   Highest education level: Not on file  Occupational History   Occupation: Scientist, clinical (histocompatibility and immunogenetics): [employer]: Not on file   Food insecurity    Worry: Not on file    Inability: Not on file   Transportation needs    Medical: Not on file    Non-medical: Not on file  Tobacco Use   Smoking status: Former Smoker    Quit date: 07/27/1969    Years since quitting: 49.5   Smokeless tobacco: Never Used  Substance and Sexual Activity   Alcohol use: Yes    Comment: 1-2 most days   Drug use: No   Sexual activity: Not on file  Lifestyle   Physical activity    Days per week: Not on file    Minutes per session: Not on file   Stress: Not on file  Relationships   Social connections    Talks on phone: Not on file    Gets together: Not on file    Attends religious service: Not on file    Active member of club or organization: Not on file    Attends meetings of clubs or organizations: Not on file    Relationship status: Not on file   Intimate partner violence    Fear of current or ex partner: Not on file    Emotionally abused: Not on file    Physically abused: Not on file    Forced sexual activity: Not on file  Other Topics Concern   Not on file  Social History Narrative   Not on file     Review of Systems    General:  No chills, fever, night sweats or weight changes.    Cardiovascular:  No chest pain, dyspnea on exertion, edema, orthopnea, palpitations, paroxysmal nocturnal dyspnea. Dermatological: No rash, lesions/masses Respiratory: No cough, dyspnea Urologic: No hematuria, dysuria Abdominal:   No nausea, vomiting, diarrhea, bright red blood per rectum, melena, or hematemesis Neurologic:  No visual changes, wkns, changes in mental status. All other systems reviewed and are otherwise negative except  as noted above.  Physical Exam    VS:  BP 128/72    Pulse 60    Ht 6\' 2"  (1.88 m)    Wt 215 lb (97.5 kg)    BMI 27.60 kg/m  , BMI Body mass index is 27.6 kg/m. GEN: Well nourished, well developed, in no acute distress. HEENT: normal. Neck: Supple, no JVD, carotid bruits, or masses. Cardiac: Regularly irregular rhythm, no murmurs, rubs, or gallops. No clubbing, cyanosis, edema.  Radials/DP/PT 2+ and equal bilaterally.  Respiratory:  Respirations regular and unlabored, clear to auscultation bilaterally. GI: Soft, nontender, nondistended, BS + x 4. MS: no deformity or atrophy. Skin: warm and dry, no rash. Neuro:  Strength and sensation are intact. Psych: Normal affect.  Accessory Clinical Findings    ECG personally reviewed by me today-sinus rhythm with sinus arrhythmia, PACs and first-degree AV block, history of incomplete RBBB 60 bpm- No acute changes  EKG 10/18/2017: Sinus rhythm with first-degree AV block 69 bpm  EKG 05/01/2016: Normal sinus rhythm 75 bpm  Echocardiogram 10/26/2017:  Study Conclusions  - Left ventricle: The cavity size was normal. Systolic function was   normal. The estimated ejection fraction was in the range of 60%   to 65%. Wall motion was normal; there were no regional wall   motion abnormalities. Features are consistent with a pseudonormal   left ventricular filling pattern, with concomitant abnormal   relaxation and increased filling pressure (grade 2 diastolic   dysfunction). Doppler parameters are consistent with    indeterminate ventricular filling pressure. - Aortic valve: Transvalvular velocity was within the normal range.   There was no stenosis. There was no regurgitation. - Aorta: Ascending aortic diameter: 36 mm (S). - Ascending aorta: The ascending aorta was at the upper limit of   normal. - Mitral valve: Trivial prolapse, involving the anterior leaflet.   Transvalvular velocity was within the normal range. There was no   evidence for stenosis. There was no regurgitation. - Right ventricle: The cavity size was normal. Wall thickness was   normal. Systolic function was normal. - Atrial septum: No defect or patent foramen ovale was identified   by color flow Doppler. - Tricuspid valve: There was moderate regurgitation. - Pulmonary arteries: Systolic pressure was mildly increased. PA   peak pressure: 46 mm Hg (S).  Assessment & Plan   1.  Paroxysmal atrial fibrillation- EKG: Normal sinus rhythm with sinus arrhythmia, PACs and first-degree AV block history of incomplete RBBB. Continue apixaban 5 mg tablet twice daily Continue metoprolol succinate 25 mg tablet daily Increase physical activity as tolerated CBC/BMP monitored by PCP  2.  Moderate tricuspid regurgitation- echocardiogram 10/26/2017 (unchanged)  3.  Palpitations- PACs per 02/02/2019 EKG, no activity intolerance, no increased shortness of breath Continue metoprolol succinate 25 mg tablet daily Continue Kardia monitor as needed  4.  Mitral valve prolapse-echocardiogram 10/26/2017 (trivial prolapse of anterior leaf, no stenosis or regurgitation.   Disposition: Follow-up with Dr. Stanford Breed in 1 year.  Deberah Pelton, NP-C 02/02/2019, 3:55 PM

## 2019-02-02 NOTE — Patient Instructions (Signed)
Medication Instructions:  Your physician recommends that you continue on your current medications as directed. Please refer to the Current Medication list given to you today.  If you need a refill on your cardiac medications before your next appointment, please call your pharmacy.   Follow-Up: At [facility], you and your health needs are our priority.  As part of our continuing mission to provide you with exceptional heart care, we have created designated Provider Care Teams.  These Care Teams include your primary Cardiologist (physician) and Advanced Practice Providers (APPs -  Physician Assistants and Nurse Practitioners) who all work together to provide you with the care you need, when you need it. You will need a follow up appointment in 12 months.  Please call our office 2 months in advance to schedule this appointment.  You may see Dr. Stanford Breed or one of the following Advanced Practice Providers on your designated Care Team:   Kerin Ransom, PA-C Roby Lofts, [place] . Sande Rives, PA-C  Any Other Special Instructions Will Be Listed Below (If Applicable). None

## 2019-02-06 ENCOUNTER — Encounter (INDEPENDENT_AMBULATORY_CARE_PROVIDER_SITE_OTHER): Payer: Medicare Other | Admitting: Ophthalmology

## 2019-02-06 ENCOUNTER — Other Ambulatory Visit: Payer: Self-pay

## 2019-02-06 DIAGNOSIS — H353132 Nonexudative age-related macular degeneration, bilateral, intermediate dry stage: Secondary | ICD-10-CM | POA: Diagnosis not present

## 2019-02-06 DIAGNOSIS — D3132 Benign neoplasm of left choroid: Secondary | ICD-10-CM

## 2019-02-06 DIAGNOSIS — H2513 Age-related nuclear cataract, bilateral: Secondary | ICD-10-CM | POA: Diagnosis not present

## 2019-02-06 DIAGNOSIS — H43813 Vitreous degeneration, bilateral: Secondary | ICD-10-CM

## 2019-02-24 ENCOUNTER — Telehealth: Payer: Self-pay

## 2019-02-24 NOTE — Telephone Encounter (Signed)
Left a detailed message for the patient about switching his upcoming appointment to a different day and doing a virtual video or telephone visit with Dr. Stanford Breed.

## 2019-03-06 ENCOUNTER — Encounter (INDEPENDENT_AMBULATORY_CARE_PROVIDER_SITE_OTHER): Payer: Medicare Other | Admitting: Ophthalmology

## 2019-03-08 ENCOUNTER — Ambulatory Visit: Payer: Medicare Other | Admitting: Cardiology

## 2019-03-13 DIAGNOSIS — H35371 Puckering of macula, right eye: Secondary | ICD-10-CM | POA: Diagnosis not present

## 2019-03-13 DIAGNOSIS — H353132 Nonexudative age-related macular degeneration, bilateral, intermediate dry stage: Secondary | ICD-10-CM | POA: Diagnosis not present

## 2019-03-13 DIAGNOSIS — H2513 Age-related nuclear cataract, bilateral: Secondary | ICD-10-CM | POA: Diagnosis not present

## 2019-03-14 ENCOUNTER — Encounter (INDEPENDENT_AMBULATORY_CARE_PROVIDER_SITE_OTHER): Payer: Medicare Other | Admitting: Ophthalmology

## 2019-03-14 DIAGNOSIS — Z125 Encounter for screening for malignant neoplasm of prostate: Secondary | ICD-10-CM | POA: Diagnosis not present

## 2019-03-14 DIAGNOSIS — E7849 Other hyperlipidemia: Secondary | ICD-10-CM | POA: Diagnosis not present

## 2019-03-14 DIAGNOSIS — Z23 Encounter for immunization: Secondary | ICD-10-CM | POA: Diagnosis not present

## 2019-03-16 DIAGNOSIS — R82998 Other abnormal findings in urine: Secondary | ICD-10-CM | POA: Diagnosis not present

## 2019-03-21 DIAGNOSIS — R42 Dizziness and giddiness: Secondary | ICD-10-CM | POA: Diagnosis not present

## 2019-03-21 DIAGNOSIS — G629 Polyneuropathy, unspecified: Secondary | ICD-10-CM | POA: Diagnosis not present

## 2019-03-21 DIAGNOSIS — N401 Enlarged prostate with lower urinary tract symptoms: Secondary | ICD-10-CM | POA: Diagnosis not present

## 2019-03-21 DIAGNOSIS — G4733 Obstructive sleep apnea (adult) (pediatric): Secondary | ICD-10-CM | POA: Diagnosis not present

## 2019-03-21 DIAGNOSIS — M1990 Unspecified osteoarthritis, unspecified site: Secondary | ICD-10-CM | POA: Diagnosis not present

## 2019-03-21 DIAGNOSIS — I480 Paroxysmal atrial fibrillation: Secondary | ICD-10-CM | POA: Diagnosis not present

## 2019-03-21 DIAGNOSIS — M459 Ankylosing spondylitis of unspecified sites in spine: Secondary | ICD-10-CM | POA: Diagnosis not present

## 2019-03-21 DIAGNOSIS — H3530 Unspecified macular degeneration: Secondary | ICD-10-CM | POA: Diagnosis not present

## 2019-03-21 DIAGNOSIS — I341 Nonrheumatic mitral (valve) prolapse: Secondary | ICD-10-CM | POA: Diagnosis not present

## 2019-03-21 DIAGNOSIS — Z0000 Encounter for general adult medical examination without abnormal findings: Secondary | ICD-10-CM | POA: Diagnosis not present

## 2019-03-21 DIAGNOSIS — E785 Hyperlipidemia, unspecified: Secondary | ICD-10-CM | POA: Diagnosis not present

## 2019-03-21 DIAGNOSIS — J302 Other seasonal allergic rhinitis: Secondary | ICD-10-CM | POA: Diagnosis not present

## 2019-04-05 ENCOUNTER — Encounter: Payer: Self-pay | Admitting: Internal Medicine

## 2019-04-05 ENCOUNTER — Other Ambulatory Visit: Payer: Self-pay | Admitting: Cardiology

## 2019-04-05 NOTE — Telephone Encounter (Signed)
Refill request

## 2019-04-07 ENCOUNTER — Encounter: Payer: Self-pay | Admitting: Internal Medicine

## 2019-04-07 ENCOUNTER — Ambulatory Visit (INDEPENDENT_AMBULATORY_CARE_PROVIDER_SITE_OTHER): Payer: Medicare Other | Admitting: Internal Medicine

## 2019-04-07 ENCOUNTER — Other Ambulatory Visit: Payer: Self-pay

## 2019-04-07 DIAGNOSIS — G4733 Obstructive sleep apnea (adult) (pediatric): Secondary | ICD-10-CM | POA: Diagnosis not present

## 2019-04-07 DIAGNOSIS — M459 Ankylosing spondylitis of unspecified sites in spine: Secondary | ICD-10-CM | POA: Diagnosis not present

## 2019-04-07 NOTE — Progress Notes (Signed)
HPI  male former smoker followed for OSA, complicated by A. fib/ablation/Eliquis, peripheral neuropathy NPSG 10/24/09-AHI 21.8/hour, desat to 86%, body weight 230 pounds HST-07/06/2018-AHI 17.5/hour, desaturation to 82%, body weight 216 pound  -----------------------------------------------------------------------------------------------  11/10/2018- Virtual Visit via Telephone Note History of Present Illness: 78 yoM male former smoker followed for OSA, complicated by A. fib/ablation/Eliquis, peripheral neuropathy His original CPAP was 9 and worked for years. Following reassessment in December he has needed several pressure changes and mask changes with some frustration. On 2/4 he had CPAP desensitization at [facility]. CPAP follow up, full face mask is comfortable, but feels the pressure is worse, sleeps well at night, feels he is losing his voice while using full face mask, DME [employer]  Mouth breathing now with full face mask. Admits this and pollen seasson may be making him hoarse.  Walks outside a lot.  Observations/Objective: CPAP auto 8-15/ [employer] Download 100% compliance, AHI 7.2/ hr (mostly centrals) Assessment and Plan: OSA- Good compliance. Still working on comfort issues.  Follow Up Instructions: 4 months  04/07/2019-  78 yo male former smoker followed for OSA, complicated by A. fib/ablation/Eliquis, peripheral neuropathy, macular degeneration, Ankylosing Spondylitis (remote dx, inactive) CPAP auto 8-15/ [employer]   Mostly around 12.7 cwp Download compliance 100%, AHI 8.2/ hr     4 minutes of Cheyne-Stokes reported -----OSA on CPAP auto 8-15, DME: [employer], pt states his machine is working fine, reports some "adjustments" since switching to a full face mask Body weight today- 216 lbs Had flu vax- PCP Sleeps well now with CPAP full face mask and chin strap. He is finally comfortable, we agreed not to tweak settings. Accept minimal break-through apnea pattern. Afib mostly controlled- followed by  cardiology.   ROS-see HPI   + = positive Constitutional:    weight loss, night sweats, fevers, chills, fatigue, lassitude. HEENT:    headaches, difficulty swallowing, tooth/dental problems, sore throat,       sneezing, itching, ear ache, nasal congestion, post nasal drip, snoring CV:    chest pain, orthopnea, PND, swelling in lower extremities, anasarca,                                  dizziness, palpitations Resp:   shortness of breath with exertion or at rest.                productive cough,   non-productive cough, coughing up of blood.              change in color of mucus.  wheezing.   Skin:    rash or lesions. GI:  No-   heartburn, indigestion, abdominal pain, nausea, vomiting, diarrhea,                 change in bowel habits, loss of appetite GU: dysuria, change in color of urine, no urgency or frequency.   flank pain. MS:   +joint pain, stiffness, decreased range of motion, back pain. Neuro-     nothing unusual Psych:  change in mood or affect.  depression or anxiety.   memory loss.  OBJ- Physical Exam General- Alert, Oriented, Affect-appropriate, Distress- none acute, +tall/lean Skin- rash-none, lesions- none, excoriation- none Lymphadenopathy- none Head- atraumatic            Eyes- Gross vision intact, PERRLA, conjunctivae and secretions clear            Ears- Hearing, canals-normal  Nose- Clear, no-Septal dev, mucus, polyps, erosion, perforation             Throat- Mallampati II , mucosa clear , drainage- none, tonsils- atrophic Neck- flexible , trachea midline, no stridor , thyroid nl, carotid no bruit Chest - symmetrical excursion , unlabored           Heart/CV- RRR , no murmur , no gallop  , no rub, nl s1 s2                           - JVD- none , edema- none, stasis changes- none, varices- none           Lung- clear to P&A, wheeze- none, cough- none , dullness-none, rub- none           Chest wall-  Abd-  Br/ Gen/ Rectal- Not done, not indicated Extrem-  cyanosis- none, clubbing, none, atrophy- none, strength- nl Neuro- grossly intact to observation                                                                                                                                                 HPI  male former smoker followed for OSA, complicated by A. fib/ablation/Eliquis, peripheral neuropathy NPSG 10/24/09-AHI 21.8/hour, desat to 86%, body weight 230 pounds HST-07/06/2018-AHI 17.5/hour, desaturation to 82%, body weight 216 pound  ----------------------------------------------------------------------------------------------- 08/17/2018-78 year old male former smoker followed for OSA, complicated by A. fib/ablation/Eliquis, peripheral neuropathy Replaced machine 07/22/2018-auto 5-20/[employer] Body weight 214 lbs HST-07/06/2018-AHI 17.5/hour, desaturation to 82%, body weight 216 pounds He felt the auto pressure 5-20 was too high and causing mouth breathing.  He preferred his old machine pressure 9 and also like the humidifier on that machine better. We had ordered pressure change to 5-15 and requested a download on that but he he disagreed with that response and asked for an office visit. Download old machine 05/19/2018-08/16/2018: Compliance 87% AHI 4.6/hour, pressure 9. Download new machine with regular use just over the last 9 days meeting compliance in that interval with AHI 5.3/hour, AutoSet 5-20.>> today to fixed 9 cwp Mask is uncomfortable- discussed mask and pressure, compliance goals.  ROS-see HPI   + = positive Constitutional:    weight loss, night sweats, fevers, chills, fatigue, lassitude. HEENT:    headaches, difficulty swallowing, tooth/dental problems, sore throat,       sneezing, itching, ear ache, nasal congestion, post nasal drip, snoring CV:    chest pain, orthopnea, PND, swelling in lower  extremities, anasarca,                                  dizziness, palpitations Resp:   shortness of breath with exertion or at rest.  productive cough,   non-productive cough, coughing up of blood.              change in color of mucus.  wheezing.   Skin:    rash or lesions. GI:  No-   heartburn, indigestion, abdominal pain, nausea, vomiting, diarrhea,                 change in bowel habits, loss of appetite GU: dysuria, change in color of urine, no urgency or frequency.   flank pain. MS:   +joint pain, stiffness, decreased range of motion, back pain. Neuro-     nothing unusual Psych:  change in mood or affect.  depression or anxiety.   memory loss.  OBJ- Physical Exam General- Alert, Oriented, Affect-appropriate, Distress- none acute, +tall/lean Skin- rash-none, lesions- none, excoriation- none Lymphadenopathy- none Head- atraumatic            Eyes- Gross vision intact, PERRLA, conjunctivae and secretions clear            Ears- Hearing, canals-normal            Nose- Clear, no-Septal dev, mucus, polyps, erosion, perforation             Throat- Mallampati II , mucosa clear , drainage- none, tonsils- atrophic Neck- flexible , trachea midline, no stridor , thyroid nl, carotid no bruit Chest - symmetrical excursion , unlabored           Heart/CV- RRR , no murmur , no gallop  , no rub, nl s1 s2                           - JVD- none , edema- none, stasis changes- none, varices- none           Lung- clear to P&A, wheeze- none, cough- none , dullness-none, rub- none           Chest wall-  Abd-  Br/ Gen/ Rectal- Not done, not indicated Extrem- cyanosis- none, clubbing, none, atrophy- none, strength- nl Neuro- grossly intact to observation   11/10/2018- Virtual Visit via Telephone Note  I connected with Manny R Sonnie Alamo. on 11/10/18 at  2:30 PM EDT by telephone and verified that I am speaking with the correct person using two identifiers.   I discussed the limitations,  risks, security and privacy concerns of performing an evaluation and management service by telephone and the availability of in person appointments. I also discussed with the patient that there may be a patient responsible charge related to this service. The patient expressed understanding and agreed to proceed.   History of Present Illness: 68 yoM male former smoker followed for OSA, complicated by A. fib/ablation/Eliquis, peripheral neuropathy His original CPAP was 9 and worked for years. Following reassessment in December he has needed several pressure changes and mask changes with some frustration. On 2/4 he had CPAP desensitization at [facility]. CPAP follow up, full face mask is comfortable, but feels the pressure is worse, sleeps well at night, feels he is losing his voice while using full face mask, DME [employer]  Mouth breathing now with full face mask. Admits this and pollen seasson may be making him hoarse.  Walks outside a lot.    Observations/Objective: CPAP auto 8-15/ [employer] Download 100% compliance, AHI 7.2/ hr (mostly centrals)   Assessment and Plan: OSA- Good compliance. Still working on comfort issues.   Follow Up Instructions: 4 months   I discussed the  assessment and treatment plan with the patient. The patient was provided an opportunity to ask questions and all were answered. The patient agreed with the plan and demonstrated an understanding of the instructions.   The patient was advised to call back or seek an in-person evaluation if the symptoms worsen or if the condition fails to improve as anticipated.  I provided 24 minutes of non-face-to-face time during this encounter.   Baird Lyons,  MD

## 2019-04-07 NOTE — Assessment & Plan Note (Signed)
Benefits from CPAP. Pressure good. We agreed not to tweak for minimal break-through AHI. Plan- continue auto 8-15

## 2019-04-07 NOTE — Patient Instructions (Signed)
We can continue CPAP auto 8-15, mask of choice, humidifier, supplies, AirView/ card  Please call if we can help

## 2019-04-13 DIAGNOSIS — Z1212 Encounter for screening for malignant neoplasm of rectum: Secondary | ICD-10-CM | POA: Diagnosis not present

## 2019-04-17 DIAGNOSIS — H353122 Nonexudative age-related macular degeneration, left eye, intermediate dry stage: Secondary | ICD-10-CM | POA: Diagnosis not present

## 2019-04-17 DIAGNOSIS — H2513 Age-related nuclear cataract, bilateral: Secondary | ICD-10-CM | POA: Diagnosis not present

## 2019-04-17 DIAGNOSIS — H353211 Exudative age-related macular degeneration, right eye, with active choroidal neovascularization: Secondary | ICD-10-CM | POA: Diagnosis not present

## 2019-04-17 DIAGNOSIS — H35371 Puckering of macula, right eye: Secondary | ICD-10-CM | POA: Diagnosis not present

## 2019-04-24 DIAGNOSIS — L298 Other pruritus: Secondary | ICD-10-CM | POA: Diagnosis not present

## 2019-04-24 DIAGNOSIS — H353211 Exudative age-related macular degeneration, right eye, with active choroidal neovascularization: Secondary | ICD-10-CM | POA: Diagnosis not present

## 2019-04-24 DIAGNOSIS — L280 Lichen simplex chronicus: Secondary | ICD-10-CM | POA: Diagnosis not present

## 2019-05-01 ENCOUNTER — Telehealth: Payer: Self-pay | Admitting: Cardiology

## 2019-05-01 ENCOUNTER — Encounter: Payer: Self-pay | Admitting: Physician Assistant

## 2019-05-01 ENCOUNTER — Ambulatory Visit (INDEPENDENT_AMBULATORY_CARE_PROVIDER_SITE_OTHER): Payer: Medicare Other | Admitting: General Practice

## 2019-05-01 ENCOUNTER — Other Ambulatory Visit: Payer: Self-pay

## 2019-05-01 VITALS — BP 114/81 | HR 132 | Temp 97.3°F | Ht 75.0 in | Wt 217.8 lb

## 2019-05-01 DIAGNOSIS — I4892 Unspecified atrial flutter: Secondary | ICD-10-CM

## 2019-05-01 DIAGNOSIS — E7800 Pure hypercholesterolemia, unspecified: Secondary | ICD-10-CM | POA: Diagnosis not present

## 2019-05-01 DIAGNOSIS — R002 Palpitations: Secondary | ICD-10-CM | POA: Diagnosis not present

## 2019-05-01 NOTE — Progress Notes (Signed)
Cardiology Clinic Note   Patient Name: Wesley [name]. Date of Encounter: 05/01/2019  Primary Care Provider:  Shon Baton, MD Primary Cardiologist:  Kirk Ruths, MD  Patient Profile    Wesley [name]. Wesley [name].  78 year old male presents for follow-up for his paroxysmal atrial fibrillation.  Past Medical History    Past Medical History:  Diagnosis Date  . A-fib [facility])    cardioversion x2  . Ankylosing spondylitis ([place])   . Atrial flutter ([place])   . GERD (gastroesophageal reflux disease)   . Hemorrhoids   . Irregular heart beat   . MVP (mitral valve prolapse)    WITH A MIDSYSTOLIC CLICK  . OSA (obstructive sleep apnea)    moderate OSA NPSG 10/24/09 - AHO 21.8/hr  . Peripheral neuropathy   . Vitamin B12 deficiency    Past Surgical History:  Procedure Laterality Date  . ABLATION    . COLONOSCOPY    . INGUINAL HERNIA REPAIR    . venous ligation     for varices left lower leg    Allergies  No Known Allergies  History of Present Illness    Wesley [name] was last seen by me on 02/02/2019.  During that time he felt like he was back in atrial fibrillation.  He had been using his Kardiamobile monitor that showed possible atrial fibrillation.  However, his EKG showed sinus rhythm with sinus arrhythmia, PACs, and first-degree AV block.  When he felt his pulse he felt like he was detecting extra beats.  He continued to be followed by [facility] and by Dr. Rolland Porter for his atrial fibrillation.  He underwent successful A. fib ablation on 02/19/2010 in [place].  His echocardiogram April 2019 showed normal LV function, mildly dilated aortic root, trivial mitral prolapse, mild to moderate right ventricular enlargement and moderate tricuspid regurgitation.  His PMH also includes OSA, peripheral neuropathy, polyneuropathy, DDD, hypercholesterolemia, and orthostatic dizziness.  He presents the clinic today and states that he had taken sildenafil on Saturday and since that time his  heart rate has been elevated in the 120 bpm range and reported dizziness with changes in positions.  He states he started to feel better on Sunday however, he woke up this morning with a continued fast heart rate and felt like he should be seen.  He has been compliant with his Eliquis and metoprolol.  He states that he has been drinking 2 cups of coffee in the morning and typically exercises daily but did not feel like exercising over the weekend.  He continues to do all of his normal daily activities and states overall he feels like he has less energy.  He denies chest pain, shortness of breath, lower extremity edema,   melena, hematuria, hemoptysis, diaphoresis, weakness, presyncope, syncope, orthopnea, and PND.   Home Medications    Prior to Admission medications   Medication Sig Start Date End Date Taking? Authorizing Provider  Alpha-Lipoic Acid 600 MG CAPS Take 600 mg by mouth daily.    [provider]  Ascorbic Acid (VITAMIN C) 1000 MG tablet Take 1,000 mg by mouth daily.      [provider]  b complex vitamins capsule Take 1 capsule by mouth daily.      [provider]  Cyanocobalamin 5000 MCG SUBL Place 1 tablet under the tongue daily.     [provider]  ELIQUIS 5 MG TABS tablet TAKE 1 TABLET TWICE A DAY 04/05/19   Lelon Perla, MD  gabapentin (  NEURONTIN) 300 MG capsule TAKE 1 CAPSULE THREE TIMES A DAY 07/04/18   Stefanie Libel, MD  Loperamide-Simethicone 2-125 MG TABS Take 1 tablet by mouth as needed.     [provider]  meloxicam (MOBIC) 15 MG tablet Take 15 mg by mouth daily as needed for pain.    [provider]  Multiple Vitamin (MULTIVITAMIN) tablet Take 1 tablet by mouth daily.      [provider]  Multiple Vitamins-Minerals (PRESERVISION AREDS 2+MULTI VIT) CAPS  01/11/19   [provider]  NEXIUM 40 MG capsule TAKE 1 CAPSULE DAILY 02/08/15   Darlin Coco, MD  NON FORMULARY [place] 03/01/19   [provider]  pyridOXINE (VITAMIN B-6) 50 MG tablet Take 50 mg by mouth 2 (two) times daily.    [provider]  rosuvastatin (CRESTOR) 5 MG tablet Take 5 mg by mouth daily. Taking 1/2 tab every other day    [provider]  TOPROL XL 25 MG 24 hr tablet TAKE 1 TABLET DAILY 01/04/19   Lelon Perla, MD    Family History    Family History  Problem Relation Age of Onset  . Prostate cancer Father   . Arthritis Father   . Cancer Father   . Heart failure Mother   . Hypertension Mother    He indicated that his mother is deceased. He indicated that his father is deceased. He indicated that his sister is alive. He indicated that his brother is alive. He indicated that his maternal grandmother is deceased. He indicated that his maternal grandfather is deceased. He indicated that his paternal grandmother is deceased. He indicated that his paternal grandfather is deceased.  Social History    Social History   Socioeconomic History  . Marital status: Married    Spouse name: Not on file  . Number of children: 2  . Years of education: 21  . Highest education level: Not on file  Occupational History  . Occupation: Scientist, clinical (histocompatibility and immunogenetics): [employer]  Social Needs  . Financial resource strain: Not on file  . Food insecurity    Worry: Not on file    Inability: Not on file  . Transportation needs    Medical: Not on file    Non-medical: Not on file  Tobacco Use  . Smoking status: Former Smoker    Packs/day: 0.00    Types: Cigarettes    Quit date: 07/27/1969    Years since quitting: 49.7  . Smokeless tobacco: Never Used  . Tobacco comment: less than 1 pack/day  Substance and Sexual Activity  . Alcohol use: Yes    Comment: 1-2 most days  . Drug use: No  . Sexual activity: Not on file  Lifestyle  . Physical activity    Days per week: Not on file    Minutes per session: Not on file  . Stress: Not on file  Relationships  . Social Herbalist on phone:  Not on file    Gets together: Not on file    Attends religious service: Not on file    Active member of club or organization: Not on file    Attends meetings of clubs or organizations: Not on file    Relationship status: Not on file  . Intimate partner violence    Fear of current or ex partner: Not on file    Emotionally abused: Not on file    Physically abused: Not on file  Forced sexual activity: Not on file  Other Topics Concern  . Not on file  Social History Narrative  . Not on file     Review of Systems    General:  No chills, fever, night sweats or weight changes.  Cardiovascular:  No chest pain, dyspnea on exertion, edema, orthopnea, palpitations, paroxysmal nocturnal dyspnea. Dermatological: No rash, lesions/masses Respiratory: No cough, dyspnea Urologic: No hematuria, dysuria Abdominal:   No nausea, vomiting, diarrhea, bright red blood per rectum, melena, or hematemesis Neurologic:  No visual changes, wkns, changes in mental status. All other systems reviewed and are otherwise negative except as noted above.  Physical Exam    VS:  BP 114/81   Pulse (!) 132   Temp (!) 97.3 F (36.3 C)   Ht 6\' 3"  (1.905 m)   Wt 217 lb 12.8 oz (98.8 kg)   SpO2 97%   BMI 27.22 kg/m  , BMI Body mass index is 27.22 kg/m. GEN: Well nourished, well developed, in no acute distress. HEENT: normal. Neck: Supple, no JVD, carotid bruits, or masses. Cardiac: RRR, no murmurs, rubs, or gallops. No clubbing, cyanosis, edema.  Radials/DP/PT 2+ and equal bilaterally.  Respiratory:  Respirations regular and unlabored, clear to auscultation bilaterally. GI: Soft, nontender, nondistended, BS + x 4. MS: no deformity or atrophy. Skin: warm and dry, no rash. Neuro:  Strength and sensation are intact. Psych: Normal affect.  Accessory Clinical Findings    ECG personally reviewed by me today-2-1 atrial flutter 132 bpm  EKG 02/02/2019 sinus rhythm with sinus arrhythmia, PACs and first-degree AV  block, history of incomplete RBBB 60 bpm- No acute changes  EKG 10/18/2017: Sinus rhythm with first-degree AV block 69 bpm  EKG 05/01/2016: Normal sinus rhythm 75 bpm  Echocardiogram 10/26/2017:  Study Conclusions  - Left ventricle: The cavity size was normal. Systolic function was normal. The estimated ejection fraction was in the range of 60% to 65%. Wall motion was normal; there were no regional wall motion abnormalities. Features are consistent with a pseudonormal left ventricular filling pattern, with concomitant abnormal relaxation and increased filling pressure (grade 2 diastolic dysfunction). Doppler parameters are consistent with indeterminate ventricular filling pressure. - Aortic valve: Transvalvular velocity was within the normal range. There was no stenosis. There was no regurgitation. - Aorta: Ascending aortic diameter: 36 mm (S). - Ascending aorta: The ascending aorta was at the upper limit of normal. - Mitral valve: Trivial prolapse, involving the anterior leaflet. Transvalvular velocity was within the normal range. There was no evidence for stenosis. There was no regurgitation. - Right ventricle: The cavity size was normal. Wall thickness was normal. Systolic function was normal. - Atrial septum: No defect or patent foramen ovale was identified by color flow Doppler. - Tricuspid valve: There was moderate regurgitation. - Pulmonary arteries: Systolic pressure was mildly increased. PA peak pressure: 46 mm Hg (S).  Assessment & Plan   Atrial flutter- EKG today shows 2-1 atrial flutter and 132 bpm Continue apixaban 5 mg tablet twice daily Increase metoprolol succinate 50 mg tablet daily Maintain daily activities Avoid caffeine, chocolate, alcohol, and to continue to wear CPAP. Plan to increase metoprolol for 3 days and reevaluate on Thursday.  Consider DCCV if he fails to convert to sinus rhythm.  Palpitations-  states he can feel  his fast heart rate, continues to maintain all of his daily activities.  He had 2 episodes of dizziness on Saturday but does not have any today.  No increased shortness of breath Increase metoprolol  succinate to 50 mg tablet daily  Hypercholesterolemia-direct LDL 144.2 (06/20/2013) Taking rosuvastatin half 5 mg tablet every other day Monitored by PCP Recommend increasing rosuvastatin to 5 mg tablet daily  Disposition: Follow-up with me on Thursday, 05/04/2019  He understands and agrees with the plan of treatment.  All questions were answered no further  concerns at this time.  Deberah Pelton, NP-C 05/01/2019, 4:49 PM

## 2019-05-01 NOTE — Telephone Encounter (Signed)
Spoke with patient of Dr. Stanford Breed. He took sildenafil on Saturday and since then his HR has been elevated - 122 bpm yesterday and 127-128bpm just now. He does not report feeling like his heart his out of rhythm (has AF history) but feels extra beats. He reports dizziness with position changes. He has a Kardiamobile device per previous notes. Asked that he record an EKG strip now since HR is elevated and send via MyChart so MD can review

## 2019-05-01 NOTE — Telephone Encounter (Signed)
LM2CB APPT SCHEDULED FOR TODAY WITH HAO MENG PA-C TO DISCUSS TACHYCARDIA AND POSSIBLE AFIB Also sent Mychart message to inform pt that appointment has been scheduled

## 2019-05-01 NOTE — Telephone Encounter (Signed)
LMTCB

## 2019-05-01 NOTE — Telephone Encounter (Signed)
Patient will be seen today!!

## 2019-05-01 NOTE — Patient Instructions (Addendum)
Medication Instructions:   Increase Metoprolol 50 mg daily If you need a refill on your cardiac medications before your next appointment, please call your pharmacy.   Lab work:  If you have labs (blood work) drawn today and your tests are completely normal, you will receive your results only by: Marland Kitchen MyChart Message (if you have MyChart) OR . A paper copy in the mail If you have any lab test that is abnormal or we need to change your treatment, we will call you to review the results.  Testing/Procedures: NONE ordered at this time of appointment   Follow-Up: At [facility], you and your health needs are our priority.  As part of our continuing mission to provide you with exceptional heart care, we have created designated Provider Care Teams.  These Care Teams include your primary Cardiologist (physician) and Advanced Practice Providers (APPs -  Physician Assistants and Nurse Practitioners) who all work together to provide you with the care you need, when you need it. . You will need to follow up with Coletta Memos, NP as scheduled  Any Other Special Instructions Will Be Listed Below (If Applicable).   Avoid cafeine, chocolate, alcohol   Wear CPAP

## 2019-05-01 NOTE — Telephone Encounter (Signed)
STAT if HR is under 50 or over 120 (normal HR is 60-100 beats per minute)  1) What is your heart rate? Yesterday 122  2) Do you have a log of your heart rate readings (document readings)? No   3) Do you have any other symptoms? No other symptoms. Does have history of A-fib, but patient is not sure if it's that. He thinks it might be a reaction to a medication he took on Saturday.

## 2019-05-01 NOTE — Telephone Encounter (Signed)
Rhythm strip difficult due to artifact; would arrange PAOV with ECG Kirk Ruths

## 2019-05-03 ENCOUNTER — Telehealth: Payer: Self-pay | Admitting: Cardiology

## 2019-05-03 ENCOUNTER — Emergency Department (HOSPITAL_COMMUNITY): Payer: Medicare Other

## 2019-05-03 ENCOUNTER — Emergency Department (HOSPITAL_COMMUNITY)
Admission: EM | Admit: 2019-05-03 | Discharge: 2019-05-03 | Disposition: A | Discharge status: Home / Self Care | Payer: Medicare Other | Attending: Emergency Medicine | Admitting: Emergency Medicine

## 2019-05-03 ENCOUNTER — Other Ambulatory Visit: Payer: Self-pay

## 2019-05-03 ENCOUNTER — Encounter (HOSPITAL_COMMUNITY): Payer: Self-pay

## 2019-05-03 DIAGNOSIS — I4891 Unspecified atrial fibrillation: Secondary | ICD-10-CM

## 2019-05-03 DIAGNOSIS — Z79899 Other long term (current) drug therapy: Secondary | ICD-10-CM | POA: Insufficient documentation

## 2019-05-03 DIAGNOSIS — Z7901 Long term (current) use of anticoagulants: Secondary | ICD-10-CM | POA: Insufficient documentation

## 2019-05-03 DIAGNOSIS — R000 Tachycardia, unspecified: Secondary | ICD-10-CM | POA: Diagnosis not present

## 2019-05-03 DIAGNOSIS — Z87891 Personal history of nicotine dependence: Secondary | ICD-10-CM | POA: Diagnosis not present

## 2019-05-03 DIAGNOSIS — R42 Dizziness and giddiness: Secondary | ICD-10-CM | POA: Diagnosis not present

## 2019-05-03 LAB — BASIC METABOLIC PANEL
Anion gap: 8 (ref 5–15)
BUN: 13 mg/dL (ref 8–23)
CO2: 26 mmol/L (ref 22–32)
Calcium: 9.2 mg/dL (ref 8.9–10.3)
Chloride: 104 mmol/L (ref 98–111)
Creatinine, Ser: 0.84 mg/dL (ref 0.61–1.24)
GFR calc Af Amer: >60 mL/min (ref >60)
GFR calc non Af Amer: >60 mL/min (ref >60)
Glucose, Bld: 108 mg/dL — ABNORMAL HIGH (ref 70–99)
Potassium: 4.1 mmol/L (ref 3.5–5.1)
Sodium: 138 mmol/L (ref 135–145)

## 2019-05-03 LAB — CBC
HCT: 42.7 % (ref 39.0–52.0)
Hemoglobin: 14.5 g/dL (ref 13.0–17.0)
MCH: 31.5 pg (ref 26.0–34.0)
MCHC: 34 g/dL (ref 30.0–36.0)
MCV: 92.6 fL (ref 80.0–100.0)
Platelets: 278 10*3/uL (ref 150–400)
RBC: 4.61 MIL/uL (ref 4.22–5.81)
RDW: 13.1 % (ref 11.5–15.5)
WBC: 6.4 10*3/uL (ref 4.0–10.5)
nRBC: 0 % (ref 0.0–0.2)

## 2019-05-03 LAB — TROPONIN I (HIGH SENSITIVITY)
Troponin I (High Sensitivity): 13 ng/L (ref <18)
Troponin I (High Sensitivity): 12 ng/L (ref <18)

## 2019-05-03 MED ORDER — APIXABAN 5 MG PO TABS
5.0000 mg | ORAL_TABLET | Freq: Two times a day (BID) | ORAL | Status: DC
  Administered 2019-05-03: 13:00:00 5 mg via ORAL
  Filled 2019-05-03 (×2): qty 1

## 2019-05-03 MED ORDER — ETOMIDATE 2 MG/ML IV SOLN
9.0000 mg | Freq: Once | INTRAVENOUS | Status: DC
  Filled 2019-05-03: qty 10

## 2019-05-03 MED ORDER — SODIUM CHLORIDE 0.9% FLUSH: 3.0000 mL | Freq: Once | INTRAVENOUS | Status: DC

## 2019-05-03 MED ORDER — ETOMIDATE 2 MG/ML IV SOLN: 0.1000 mg/kg | Freq: Once | INTRAVENOUS | Status: DC

## 2019-05-03 MED ORDER — ETOMIDATE 2 MG/ML IV SOLN
INTRAVENOUS | Status: AC | PRN
  Administered 2019-05-03: 9 mg via INTRAVENOUS

## 2019-05-03 NOTE — Telephone Encounter (Signed)
Pt called answering service to report high HR. I called the patient and he reports that His metoprolol was increased on Monday for high HR. Per office note, afib clinic, pt was in atrial flutter. His HR initially improved form 130's to 90's, however, overnight it has been up. HR 160 per [facility] this am. This am radial pulse is not felt to be that high, BP 112/98. Pt with vague mild chest pressure and mild SOB, lack of energy. (Pt trying to send alive cor readings through mychart) I discussed with Coletta Memos, NP who was seeing pt in afib clinic. He advised to have pt go to ED. He probably needs cardioversion. I informed pt. He has been compliant with his anticoagulation with no missed doses. He has had 1/2 banana and few bites of pastry this am. I asked him not to eat anything else.

## 2019-05-03 NOTE — Discharge Instructions (Signed)
Take 2 metoprolol (TOPROL XL) tablets (50mg ) once daily until you follow up with Cardiology.

## 2019-05-03 NOTE — Telephone Encounter (Signed)
Follow Up:; ° ° °Returning your call. °

## 2019-05-03 NOTE — ED Notes (Signed)
Pt ambulated well in the hallway. No complaints of pain or dizziness.

## 2019-05-03 NOTE — ED Provider Notes (Signed)
[name] Provider Note   CSN: [id number] Arrival date & time: 05/03/19  0830     History   Chief Complaint Chief Complaint  Patient presents with   Tachycardia    HPI [name]. is a 78 y.o. male with PMH/o AFIB (on eliquis) who presents for evaluation of tachycardia and palpitations.  He states he first noticed this a few days ago and felt like his heart rate was jumping up.  He noticed on his wrist monitor that his heart rate was going up to the 130s.  He went to the heart care clinic on 05/01/2019 where they saw him and noticed that he was in a flutter.  They decided to increase his metoprolol to see if that would help with symptoms.  He does report that he had some improvement but then noticed last night that his heart rate was jumping up again and noticed that this morning, it went into the 160s.  He states that he had had a history of A. fib and had multiple cardioversions.  He had an ablation done in 2011 and states that since then, he has been relatively episode free of A. fib.  He states that he is on Eliquis and has been compliant and has not missed any doses.  He does report that he occasionally feels some lightheadedness sensation but denies any chest pain, difficulty breathing.      The history is provided by the patient.    Past Medical History:  Diagnosis Date   A-fib [facility])    cardioversion x2   Ankylosing spondylitis (HCC)    Atrial flutter (HCC)    GERD (gastroesophageal reflux disease)    Hemorrhoids    Irregular heart beat    MVP (mitral valve prolapse)    WITH A MIDSYSTOLIC CLICK   OSA (obstructive sleep apnea)    moderate OSA NPSG 10/24/09 - AHO 21.8/hr   Peripheral neuropathy    Vitamin B12 deficiency     Patient Active Problem List   Diagnosis Date Noted   DDD (degenerative disc disease), lumbar 10/28/2017   Bilateral bunions 08/20/2016   Varicose veins of right lower extremity with  complications [id number]   Bruit 10/10/2015   Varicose veins of leg with complications [id number]   Ankylosing spondylitis ([place]) 01/14/2015   Chronic anticoagulation 01/14/2015   Sleep apnea 01/14/2015   S/P ablation of atrial fibrillation 01/08/2015   Persistent atrial fibrillation ([place]) 01/08/2015   Orthostatic dizziness 12/01/2013   Calf pain 11/22/2012   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/22/2012   Peripheral neuropathy 10/21/2012   Pain in joint, ankle and foot 09/21/2012   Disturbance of skin sensation 09/21/2012   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 ([place]) 09/21/2012   Other general symptoms(780.99) 09/21/2012   Heel pain 06/07/2012   Plantar fasciitis 06/07/2012   Mitral valve prolapse 06/03/2011   Arthritis pain of shoulder 04/03/2011   Rotator cuff syndrome of right shoulder 04/03/2011   Paroxysmal atrial fibrillation ([place]) 01/20/2011   Hypercholesterolemia 01/20/2011   OBSTRUCTIVE SLEEP APNEA 10/08/2009    Past Surgical History:  Procedure Laterality Date   ABLATION     COLONOSCOPY     INGUINAL HERNIA REPAIR     venous ligation     for varices left lower leg        Home Medications    Prior to Admission medications   Medication Sig Start Date End Date Taking? Authorizing Provider  acidophilus (RISAQUAD) CAPS capsule Take 1 capsule by  mouth daily.   Yes [provider]  Alpha-Lipoic Acid 600 MG CAPS Take 600 mg by mouth daily.   Yes [provider]  Ascorbic Acid (VITAMIN C) 1000 MG tablet Take 1,000 mg by mouth daily.     Yes [provider]  b complex vitamins capsule Take 1 capsule by mouth daily.     Yes [provider]  Cyanocobalamin 5000 MCG SUBL Place 1 tablet under the tongue daily.    Yes [provider]  ELIQUIS 5 MG TABS tablet TAKE 1 TABLET TWICE A DAY 04/05/19  Yes Crenshaw, Denice Bors, MD  gabapentin (NEURONTIN) 300 MG capsule TAKE 1 CAPSULE THREE TIMES A DAY 07/04/18   Yes Stefanie Libel, MD  Loperamide-Simethicone 2-125 MG TABS Take 1 tablet by mouth as needed.    Yes [provider]  meloxicam (MOBIC) 15 MG tablet Take 15 mg by mouth daily as needed for pain.   Yes [provider]  Multiple Vitamin (MULTIVITAMIN) tablet Take 1 tablet by mouth daily.     Yes [provider]  Multiple Vitamins-Minerals (PRESERVISION AREDS 2+MULTI VIT) CAPS Take 1 tablet by mouth daily.  01/11/19  Yes [provider]  NEXIUM 40 MG capsule TAKE 1 CAPSULE DAILY 02/08/15  Yes Darlin Coco, MD  NON FORMULARY Take 2 capsules by mouth daily. MITOQ 03/01/19  Yes [provider]  pyridOXINE (VITAMIN B-6) 50 MG tablet Take 50 mg by mouth 2 (two) times daily.   Yes [provider]  rosuvastatin (CRESTOR) 5 MG tablet Take 5 mg by mouth daily. Taking 1/2 tab every other day   Yes [provider]  TOPROL XL 25 MG 24 hr tablet TAKE 1 TABLET DAILY Patient taking differently: Take 25 mg by mouth daily.  01/04/19  Yes Crenshaw, Denice Bors, MD    Family History Family History  Problem Relation Age of Onset   Prostate cancer Father    Arthritis Father    Cancer Father    Heart failure Mother    Hypertension Mother     Social History Social History   Tobacco Use   Smoking status: Former Smoker    Packs/day: 0.00    Types: Cigarettes    Quit date: 07/27/1969    Years since quitting: 49.8   Smokeless tobacco: Never Used   Tobacco comment: less than 1 pack/day  Substance Use Topics   Alcohol use: Yes    Comment: 1-2 most days   Drug use: No     Allergies   Patient has no known allergies.   Review of Systems Review of Systems  Constitutional: Negative for fever.  Respiratory: Negative for cough and shortness of breath.   Cardiovascular: Negative for chest pain.  Gastrointestinal: Negative for abdominal pain, nausea and vomiting.  Neurological: Positive for light-headedness. Negative for headaches.  All  other systems reviewed and are negative.    Physical Exam Updated Vital Signs BP 128/71 (BP Location: Right Arm)    Pulse 62    Temp 97.8 F (36.6 C) (Oral)    Resp 18    SpO2 95%   Physical Exam Vitals signs and nursing note reviewed.  Constitutional:      Appearance: Normal appearance. He is well-developed.  HENT:     Head: Normocephalic and atraumatic.  Eyes:     General: Lids are normal.     Conjunctiva/sclera: Conjunctivae normal.     Pupils: Pupils are equal, round, and reactive to light.  Neck:  Musculoskeletal: Full passive range of motion without pain.  Cardiovascular:     Rate and Rhythm: Tachycardia present. Rhythm irregularly irregular.     Pulses: Normal pulses.          Radial pulses are 2+ on the right side and 2+ on the left side.     Heart sounds: Normal heart sounds. No murmur. No friction rub. No gallop.   Pulmonary:     Effort: Pulmonary effort is normal.     Breath sounds: Normal breath sounds.     Comments: Lungs clear to auscultation bilaterally.  Symmetric chest rise.  No wheezing, rales, rhonchi. Abdominal:     Palpations: Abdomen is soft. Abdomen is not rigid.     Tenderness: There is no abdominal tenderness. There is no guarding.  Musculoskeletal: Normal range of motion.  Skin:    General: Skin is warm and dry.     Capillary Refill: Capillary refill takes less than 2 seconds.  Neurological:     Mental Status: He is alert and oriented to person, place, and time.  Psychiatric:        Speech: Speech normal.      ED Treatments / Results  Labs (all labs ordered are listed, but only abnormal results are displayed) Labs Reviewed  BASIC METABOLIC PANEL - Abnormal; Notable for the following components:      Result Value   Glucose, Bld 108 (*)    All other components within normal limits  CBC  TROPONIN I (HIGH SENSITIVITY)  TROPONIN I (HIGH SENSITIVITY)    EKG EKG Interpretation  Date/Time:  Wednesday May 03 2019 08:38:12  EDT Ventricular Rate:  127 PR Interval:    QRS Duration: 118 QT Interval:  340 QTC Calculation: 494 R Axis:   -63 Text Interpretation:  Atrial fibrillation with rapid ventricular response Right bundle branch block Left anterior fascicular block  Bifascicular block  Abnormal ECG Since prior ECG, rate has increased, rhythm is now atrial fibrillation Confirmed by Gareth Morgan [phone]) on 05/03/2019 11:17:53 AM   Radiology Dg Chest 2 View  Result Date: 05/03/2019 CLINICAL DATA:  Tachycardia. EXAM: CHEST - 2 VIEW COMPARISON:  Radiographs of May 20, 2009. FINDINGS: Stable cardiomediastinal silhouette. No pneumothorax or pleural effusion is noted. Both lungs are clear. The visualized skeletal structures are unremarkable. IMPRESSION: No active cardiopulmonary disease. Electronically Signed   By: Marijo Conception M.D.   On: 05/03/2019 09:19    Procedures .Critical Care Performed by: Volanda Napoleon, PA-C Authorized by: Volanda Napoleon, PA-C   Critical care provider statement:    Critical care time (minutes):  35   Critical care was necessary to treat or prevent imminent or life-threatening deterioration of the following conditions: Afib with RVR.   Critical care was time spent personally by me on the following activities:  Discussions with consultants, evaluation of patient's response to treatment, examination of patient, ordering and performing treatments and interventions, ordering and review of laboratory studies, ordering and review of radiographic studies, pulse oximetry, re-evaluation of patient's condition, obtaining history from patient or surrogate and review of old charts .Cardioversion  Date/Time: 05/03/2019 3:37 PM Performed by: Volanda Napoleon, PA-C Authorized by: Volanda Napoleon, PA-C   Consent:    Consent obtained:  Verbal   Consent given by:  Patient Pre-procedure details:    Cardioversion basis:  Emergent   Rhythm:  Atrial fibrillation   Electrode placement:   Anterior-lateral Patient sedated: Yes. Refer to sedation procedure documentation for details of sedation.  Attempt one:    Cardioversion mode:  Synchronous   Waveform:  Biphasic   Shock (Joules):  150   Shock outcome:  Conversion to normal sinus rhythm Post-procedure details:    Patient status:  Awake   Patient tolerance of procedure:  Tolerated well, no immediate complications   (including critical care time)  Medications Ordered in ED Medications  sodium chloride flush (NS) 0.9 % injection 3 mL (has no administration in time range)  etomidate (AMIDATE) injection 9 mg (has no administration in time range)  apixaban (ELIQUIS) tablet 5 mg (5 mg Oral Given 05/03/19 1329)  etomidate (AMIDATE) injection (9 mg Intravenous Given 05/03/19 1210)     Initial Impression / Assessment and Plan / ED Course  I have reviewed the triage vital signs and the nursing notes.  Pertinent labs & imaging results that were available during my care of the patient were reviewed by me and considered in my medical decision making (see chart for details).        78 y.o. M with PMH/o Afib (on eliquis) who presents for evaluation of tachycardia.  He reports that over the last couple days, he has had palpitations.  He was seen by cardiac care and was noted to be in atrial flutter.  He was started on double dose of his metoprolol.  He reports a history of A. fib and had to be cardioverted multiple times in 2011.  He still was having episodes of A. fib so he was scheduled for cardiac ablation.  He reports that since the cardiac ablation, he has not had issues with A. fib.  He states he is not having chest pain or difficulty breathing.  He occasionally get some lightheadedness but otherwise denies any complaints.  Initially arrival, he is afebrile, nontoxic-appearing.  He is tachycardic with heart rates ranging between 120-150s.  Vitals otherwise stable.  He does appear to be in A. fib on exam and on monitor.  He has been  taking his Eliquis without any difficulty.  He states he did not take it this morning but otherwise has had all doses.  Concern for A. fib with RVR.  Plan for labs, EKG.  Trop is negative.  BMP is unremarkable.  CBC without any leukocytosis or anemia.  Chest x-ray is unremarkable.  Dr. Billy Fischer discussed with cardiology.  Given that patient is been on Eliquis, he is a candidate for cardioversion.  Discussed with patient and he is agreeable.  Cardioversion as documented.  Patient tolerated procedure well.  He did appear to go back into normal sinus rhythm.  Will monitor.  Patient able to ambulate without any difficulty.  Repeat EKG is improved.  Patient states he feels better. Dr. Billy Fischer discussed with cardiology who recommended that patient continue on high-dose metoprolol.  Patient strict follow-up with Dr. Stanford Breed. At this time, patient exhibits no emergent life-threatening condition that require further evaluation in ED ro admissmion. Patient had ample opportunity for questions and discussion. All patient's questions were answered with full understanding. Strict return precautions discussed. Patient expresses understanding and agreement to plan.   Portions of this note were generated with Lobbyist. Dictation errors may occur despite best attempts at proofreading.     Final Clinical Impressions(s) / ED Diagnoses   Final diagnoses:  Atrial fibrillation with RVR [facility])    ED Discharge Orders    None       [name] 05/03/19 1538    Gareth Morgan, MD 05/04/19 2334

## 2019-05-03 NOTE — Telephone Encounter (Signed)
Left message to call back  

## 2019-05-03 NOTE — Telephone Encounter (Signed)
Spoke with patient and he would like to see Dr Stanford Breed for follow up. He saw Blima Ledger. NP 10/5 and ED today. Had cardioversion today in ED. He was told in ED to follow up with Dr Stanford Breed or Afib clinic as soon as possible. Will send Afib clinic message about appointment this week.   Patient is feeling better but needs guidance on next steps/medications, would like appointment this week if possible

## 2019-05-03 NOTE — Telephone Encounter (Signed)
Follow Up:     If he does not answer at the first number, please call [phone].

## 2019-05-03 NOTE — ED Triage Notes (Signed)
Patient states that he has increased HR for several days and MD increased lopressor to slow rate. denies CP. States that he has hx of atrial fib with ablation. Complains of weakness with same

## 2019-05-03 NOTE — Telephone Encounter (Signed)
° ° °  Patient would like to see Dr Stanford Breed only to discuss medications and plan of care. Declined appointment with APP  Please call

## 2019-05-04 ENCOUNTER — Ambulatory Visit: Payer: Medicare Other | Admitting: General Practice

## 2019-05-04 NOTE — ED Provider Notes (Signed)
  Physical Exam  BP 128/71 (BP Location: Right Arm)   Pulse 62   Temp 97.8 F (36.6 C) (Oral)   Resp 18   SpO2 95%   Physical Exam  ED Course/Procedures     .Sedation  Date/Time: 05/04/2019 11:35 PM Performed by: Gareth Morgan, MD Authorized by: Gareth Morgan, MD   Consent:    Consent obtained:  Verbal   Consent given by:  Patient   Risks discussed:  Allergic reaction, dysrhythmia, inadequate sedation, nausea, prolonged hypoxia resulting in organ damage, prolonged sedation necessitating reversal, respiratory compromise necessitating ventilatory assistance and intubation and vomiting   Alternatives discussed:  Analgesia without sedation, anxiolysis and regional anesthesia Universal protocol:    Procedure explained and questions answered to patient or proxy's satisfaction: yes     Relevant documents present and verified: yes     Test results available and properly labeled: yes     Imaging studies available: yes     Required blood products, implants, devices, and special equipment available: yes     Site/side marked: yes     Immediately prior to procedure a time out was called: yes     Patient identity confirmation method:  Verbally with patient Indications:    Procedure necessitating sedation performed by:  Physician performing sedation Pre-sedation assessment:    Time since last food or drink:  5   ASA classification: class 2 - patient with mild systemic disease     Neck mobility: normal     Mouth opening:  3 or more finger widths   Thyromental distance:  4 finger widths   Mallampati score:  I - soft palate, uvula, fauces, pillars visible   Pre-sedation assessments completed and reviewed: airway patency, cardiovascular function, hydration status, mental status, nausea/vomiting, pain level, respiratory function and temperature   Immediate pre-procedure details:    Reassessment: Patient reassessed immediately prior to procedure     Reviewed: vital signs, relevant  labs/tests and NPO status     Verified: bag valve mask available, emergency equipment available, intubation equipment available, IV patency confirmed, oxygen available and suction available   Procedure details (see MAR for exact dosages):    Preoxygenation:  Nasal cannula   Sedation:  Etomidate   Intended level of sedation: deep   Intra-procedure monitoring:  Blood pressure monitoring, cardiac monitor, continuous pulse oximetry, frequent LOC assessments, frequent vital sign checks and continuous capnometry   Intra-procedure events: none     Total Provider sedation time (minutes):  15 Post-procedure details:    Attendance: Constant attendance by certified staff until patient recovered     Recovery: Patient returned to pre-procedure baseline     Post-sedation assessments completed and reviewed: airway patency, cardiovascular function, hydration status, mental status, nausea/vomiting, pain level, respiratory function and temperature     Patient is stable for discharge or admission: yes     Patient tolerance:  Tolerated well, no immediate complications Comments:     9mg  etomidate    MDM   Presents with palpitations, found to be in atrial fibrillation with RVR. Chads2-VASc score of 2.  Taking anticoagulation, has paroxysmal episodes. Discussed with Cardiology and agree appropriate for cardioversion and discussed risks and benefits.  Cardioverted to normal sinus rhythm without complications. Having frequent PACs. Discussed with Cardiology, will have pt continue 50mg  metoprolol as discussed a few days ago until further direction from Cardiology.        Gareth Morgan, MD 05/04/19 [phone]

## 2019-05-04 NOTE — Telephone Encounter (Signed)
Spoke with Afib clinic and advised patient of appointment Monday 10/12. Phone number given to patient so he could get directions and parking code.  Patient was c/o feeling a little sluggish at times otherwise feeling well. Blood pressure 111/78-122/86 HR 65-73 Will continue current medications until seen Monday

## 2019-05-08 ENCOUNTER — Other Ambulatory Visit: Payer: Self-pay

## 2019-05-08 ENCOUNTER — Encounter (HOSPITAL_COMMUNITY): Payer: Self-pay | Admitting: Physician Assistant

## 2019-05-08 ENCOUNTER — Ambulatory Visit (HOSPITAL_COMMUNITY)
Admission: RE | Admit: 2019-05-08 | Discharge: 2019-05-08 | Disposition: A | Discharge status: Home / Self Care | Payer: Medicare Other | Source: Ambulatory Visit | Attending: Physician Assistant | Admitting: Physician Assistant

## 2019-05-08 VITALS — BP 118/74 | HR 67 | Ht 75.0 in | Wt 214.0 lb

## 2019-05-08 DIAGNOSIS — Z8042 Family history of malignant neoplasm of prostate: Secondary | ICD-10-CM | POA: Diagnosis not present

## 2019-05-08 DIAGNOSIS — Z79899 Other long term (current) drug therapy: Secondary | ICD-10-CM | POA: Insufficient documentation

## 2019-05-08 DIAGNOSIS — I480 Paroxysmal atrial fibrillation: Secondary | ICD-10-CM | POA: Diagnosis not present

## 2019-05-08 DIAGNOSIS — G629 Polyneuropathy, unspecified: Secondary | ICD-10-CM | POA: Diagnosis not present

## 2019-05-08 DIAGNOSIS — I341 Nonrheumatic mitral (valve) prolapse: Secondary | ICD-10-CM | POA: Diagnosis not present

## 2019-05-08 DIAGNOSIS — G4733 Obstructive sleep apnea (adult) (pediatric): Secondary | ICD-10-CM | POA: Insufficient documentation

## 2019-05-08 DIAGNOSIS — I454 Nonspecific intraventricular block: Secondary | ICD-10-CM | POA: Insufficient documentation

## 2019-05-08 DIAGNOSIS — Z87891 Personal history of nicotine dependence: Secondary | ICD-10-CM | POA: Diagnosis not present

## 2019-05-08 DIAGNOSIS — I071 Rheumatic tricuspid insufficiency: Secondary | ICD-10-CM | POA: Diagnosis not present

## 2019-05-08 DIAGNOSIS — K219 Gastro-esophageal reflux disease without esophagitis: Secondary | ICD-10-CM | POA: Insufficient documentation

## 2019-05-08 DIAGNOSIS — Z8249 Family history of ischemic heart disease and other diseases of the circulatory system: Secondary | ICD-10-CM | POA: Insufficient documentation

## 2019-05-08 DIAGNOSIS — Z7901 Long term (current) use of anticoagulants: Secondary | ICD-10-CM | POA: Diagnosis not present

## 2019-05-08 DIAGNOSIS — I4892 Unspecified atrial flutter: Secondary | ICD-10-CM | POA: Diagnosis not present

## 2019-05-08 DIAGNOSIS — R9431 Abnormal electrocardiogram [ECG] [EKG]: Secondary | ICD-10-CM | POA: Insufficient documentation

## 2019-05-08 DIAGNOSIS — E538 Deficiency of other specified B group vitamins: Secondary | ICD-10-CM | POA: Diagnosis not present

## 2019-05-08 DIAGNOSIS — I491 Atrial premature depolarization: Secondary | ICD-10-CM | POA: Insufficient documentation

## 2019-05-08 NOTE — Progress Notes (Signed)
Primary Care Physician: Shon Baton, MD Primary Cardiologist: Dr Stanford Breed Primary Electrophysiologist: none Referring Physician: [facility]   Wesley [name]. is a 78 y.o. male with a history of ankylosing spondylitis, MVP, OSA, paroxysmal/ persistent atrial fibrillation who presents for consultation in the [facility]. Patient was diagnosed with afib several years ago and underwent ablation by Dr Rolland Porter at [facility] in 2011. He has done very well since then until 05/01/19 when he was seen at [facility] for heart racing and dizziness. He was in atrial flutter at that time and his BB was increased with plans for DCCV. He was seen in the ER on 05/03/19 and underwent successful DCCV at that time. He does have a history of OSA and is compliant with his CPAP. He denies significant alcohol use. Since his DCCV, he has felt well with no heart racing or palpitations.   Today, he denies symptoms of palpitations, chest pain, shortness of breath, orthopnea, PND, lower extremity edema, dizziness, presyncope, syncope, snoring, daytime somnolence, bleeding, or neurologic sequela. The patient is tolerating medications without difficulties and is otherwise without complaint today.    Atrial Fibrillation Risk Factors:  he does have symptoms or diagnosis of sleep apnea. he is compliant with CPAP therapy. he does not have a history of rheumatic fever. he does not have a history of alcohol use.  he has a BMI of Body mass index is 26.75 kg/m.Marland Kitchen Filed Weights   05/08/19 1507  Weight: 97.1 kg    Family History  Problem Relation Age of Onset  . Prostate cancer Father   . Arthritis Father   . Cancer Father   . Heart failure Mother   . Hypertension Mother      Atrial Fibrillation Management history:  Previous antiarrhythmic drugs: none Previous cardioversions: 05/03/19 Previous ablations: 2011 Dr Rolland Porter [facility] score: 2 Anticoagulation history: Eliquis    Past  Medical History:  Diagnosis Date  . A-fib [facility])    cardioversion x2  . Ankylosing spondylitis ([place])   . Atrial flutter ([place])   . GERD (gastroesophageal reflux disease)   . Hemorrhoids   . Irregular heart beat   . MVP (mitral valve prolapse)    WITH A MIDSYSTOLIC CLICK  . OSA (obstructive sleep apnea)    moderate OSA NPSG 10/24/09 - AHO 21.8/hr  . Peripheral neuropathy   . Vitamin B12 deficiency    Past Surgical History:  Procedure Laterality Date  . ABLATION    . COLONOSCOPY    . INGUINAL HERNIA REPAIR    . venous ligation     for varices left lower leg    Current Outpatient Medications  Medication Sig Dispense Refill  . acidophilus (RISAQUAD) CAPS capsule Take 1 capsule by mouth daily.    . Alpha-Lipoic Acid 600 MG CAPS Take 600 mg by mouth daily.    . Ascorbic Acid (VITAMIN C) 1000 MG tablet Take 1,000 mg by mouth daily.      Marland Kitchen b complex vitamins capsule Take 1 capsule by mouth daily.      . Cyanocobalamin 5000 MCG SUBL Place 1 tablet under the tongue daily.     Marland Kitchen ELIQUIS 5 MG TABS tablet TAKE 1 TABLET TWICE A DAY 180 tablet 0  . gabapentin (NEURONTIN) 300 MG capsule TAKE 1 CAPSULE THREE TIMES A DAY 270 capsule 4  . Loperamide-Simethicone 2-125 MG TABS Take 1 tablet by mouth as needed.     . meloxicam (MOBIC) 15 MG tablet Take  15 mg by mouth daily as needed for pain.    . Multiple Vitamin (MULTIVITAMIN) tablet Take 1 tablet by mouth daily.      . Multiple Vitamins-Minerals (PRESERVISION AREDS 2+MULTI VIT) CAPS Take 1 tablet by mouth daily.     Marland Kitchen NEXIUM 40 MG capsule TAKE 1 CAPSULE DAILY 90 capsule 3  . NON FORMULARY Take 2 capsules by mouth daily. MITOQ    . pyridOXINE (VITAMIN B-6) 50 MG tablet Take 50 mg by mouth 2 (two) times daily.    . rosuvastatin (CRESTOR) 5 MG tablet Take 5 mg by mouth daily. Taking 1/2 tab every other day    . TOPROL XL 25 MG 24 hr tablet TAKE 1 TABLET DAILY (Patient taking differently: Take 25 mg by mouth daily. ) 90 tablet 3   No current  facility-administered medications for this encounter.     No Known Allergies  Social History   Socioeconomic History  . Marital status: Married    Spouse name: Not on file  . Number of children: 2  . Years of education: 48  . Highest education level: Not on file  Occupational History  . Occupation: Scientist, clinical (histocompatibility and immunogenetics): [employer]  Social Needs  . Financial resource strain: Not on file  . Food insecurity    Worry: Not on file    Inability: Not on file  . Transportation needs    Medical: Not on file    Non-medical: Not on file  Tobacco Use  . Smoking status: Former Smoker    Packs/day: 0.00    Types: Cigarettes    Quit date: 07/27/1969    Years since quitting: 49.8  . Smokeless tobacco: Never Used  . Tobacco comment: less than 1 pack/day  Substance and Sexual Activity  . Alcohol use: Yes    Comment: 1-2 most days  . Drug use: No  . Sexual activity: Not on file  Lifestyle  . Physical activity    Days per week: Not on file    Minutes per session: Not on file  . Stress: Not on file  Relationships  . Social Herbalist on phone: Not on file    Gets together: Not on file    Attends religious service: Not on file    Active member of club or organization: Not on file    Attends meetings of clubs or organizations: Not on file    Relationship status: Not on file  . Intimate partner violence    Fear of current or ex partner: Not on file    Emotionally abused: Not on file    Physically abused: Not on file    Forced sexual activity: Not on file  Other Topics Concern  . Not on file  Social History Narrative  . Not on file     ROS- All systems are reviewed and negative except as per the HPI above.  Physical Exam: Vitals:   05/08/19 1507  BP: 118/74  Pulse: 67  Weight: 97.1 kg  Height: 6\' 3"  (1.905 m)    GEN- The patient is well appearing elderly male, alert and oriented x 3 today.   Head- normocephalic, atraumatic Eyes-  Sclera clear, conjunctiva  pink Ears- hearing intact Oropharynx- clear Neck- supple  Lungs- Clear to ausculation bilaterally, normal work of breathing Heart- slightly irregular rate and rhythm, no murmurs, rubs or gallops  GI- soft, NT, ND, + BS Extremities- no clubbing, cyanosis, or edema MS- no significant deformity or  atrophy Skin- no rash or lesion Psych- euthymic mood, full affect Neuro- strength and sensation are intact  Wt Readings from Last 3 Encounters:  05/08/19 97.1 kg  05/01/19 98.8 kg  04/07/19 98.1 kg    EKG today demonstrates SR HR 67, PACs, 1st degree AV block, LAD, PR 216, QRS 128, QTc 443  Echo 10/26/17 demonstrated  - Left ventricle: The cavity size was normal. Systolic function was   normal. The estimated ejection fraction was in the range of 60%   to 65%. Wall motion was normal; there were no regional wall   motion abnormalities. Features are consistent with a pseudonormal   left ventricular filling pattern, with concomitant abnormal   relaxation and increased filling pressure (grade 2 diastolic   dysfunction). Doppler parameters are consistent with   indeterminate ventricular filling pressure. - Aortic valve: Transvalvular velocity was within the normal range.   There was no stenosis. There was no regurgitation. - Aorta: Ascending aortic diameter: 36 mm (S). - Ascending aorta: The ascending aorta was at the upper limit of   normal. - Mitral valve: Trivial prolapse, involving the anterior leaflet.   Transvalvular velocity was within the normal range. There was no   evidence for stenosis. There was no regurgitation. - Right ventricle: The cavity size was normal. Wall thickness was   normal. Systolic function was normal. - Atrial septum: No defect or patent foramen ovale was identified   by color flow Doppler. - Tricuspid valve: There was moderate regurgitation. - Pulmonary arteries: Systolic pressure was mildly increased. PA   peak pressure: 46 mm Hg (S).  Epic records are  reviewed at length today  Assessment and Plan:  1. Paroxysmal atrial fibrillation/atrial flutter Patient has done very well for years after ablation.  S/p DCCV 05/03/19. We discussed therapeutic options including AAD vs repeat ablation. Given how well he has done with lifestyle changes, will hold on interventions for now. Continue Eliquis 5 mg BID Continue Toprol 25 mg daily Lifestyle changes as below.  This patients CHA2DS2-VASc Score and unadjusted Ischemic Stroke Rate (% per year) is equal to 2.2 % stroke rate/year from a score of 2  Above score calculated as 1 point each if present [CHF, HTN, DM, Vascular=MI/PAD/Aortic Plaque, Age if 65-74, or Male] Above score calculated as 2 points each if present [Age > 75, or Stroke/TIA/TE]   2. Obstructive sleep apnea The importance of adequate treatment of sleep apnea was discussed today in order to improve our ability to maintain sinus rhythm long term. Patient reports compliance with CPAP.   Patient prefers to follow up with Dr Rolland Porter for his afib at this time (has virtual appt with him tomorrow). Will see in AF clinic as needed.    [facility] [address] [place], [place] [address] [phone] 05/08/2019 3:11 PM

## 2019-05-09 DIAGNOSIS — Z8679 Personal history of other diseases of the circulatory system: Secondary | ICD-10-CM | POA: Diagnosis not present

## 2019-05-09 DIAGNOSIS — I4819 Other persistent atrial fibrillation: Secondary | ICD-10-CM | POA: Diagnosis not present

## 2019-05-09 DIAGNOSIS — Z9889 Other specified postprocedural states: Secondary | ICD-10-CM | POA: Diagnosis not present

## 2019-05-09 DIAGNOSIS — Z7901 Long term (current) use of anticoagulants: Secondary | ICD-10-CM | POA: Diagnosis not present

## 2019-05-22 DIAGNOSIS — H353211 Exudative age-related macular degeneration, right eye, with active choroidal neovascularization: Secondary | ICD-10-CM | POA: Diagnosis not present

## 2019-05-22 DIAGNOSIS — H353122 Nonexudative age-related macular degeneration, left eye, intermediate dry stage: Secondary | ICD-10-CM | POA: Diagnosis not present

## 2019-05-29 DIAGNOSIS — D229 Melanocytic nevi, unspecified: Secondary | ICD-10-CM | POA: Diagnosis not present

## 2019-05-29 DIAGNOSIS — L821 Other seborrheic keratosis: Secondary | ICD-10-CM | POA: Diagnosis not present

## 2019-05-29 DIAGNOSIS — L720 Epidermal cyst: Secondary | ICD-10-CM | POA: Diagnosis not present

## 2019-05-29 DIAGNOSIS — L570 Actinic keratosis: Secondary | ICD-10-CM | POA: Diagnosis not present

## 2019-05-29 DIAGNOSIS — I8393 Asymptomatic varicose veins of bilateral lower extremities: Secondary | ICD-10-CM | POA: Diagnosis not present

## 2019-05-29 DIAGNOSIS — D1801 Hemangioma of skin and subcutaneous tissue: Secondary | ICD-10-CM | POA: Diagnosis not present

## 2019-05-29 DIAGNOSIS — L814 Other melanin hyperpigmentation: Secondary | ICD-10-CM | POA: Diagnosis not present

## 2019-06-19 DIAGNOSIS — H353122 Nonexudative age-related macular degeneration, left eye, intermediate dry stage: Secondary | ICD-10-CM | POA: Diagnosis not present

## 2019-06-19 DIAGNOSIS — H353211 Exudative age-related macular degeneration, right eye, with active choroidal neovascularization: Secondary | ICD-10-CM | POA: Diagnosis not present

## 2019-06-23 ENCOUNTER — Other Ambulatory Visit: Payer: Self-pay | Admitting: Cardiology

## 2019-07-10 DIAGNOSIS — M150 Primary generalized (osteo)arthritis: Secondary | ICD-10-CM | POA: Diagnosis not present

## 2019-07-10 DIAGNOSIS — I480 Paroxysmal atrial fibrillation: Secondary | ICD-10-CM | POA: Diagnosis not present

## 2019-07-10 DIAGNOSIS — M5136 Other intervertebral disc degeneration, lumbar region: Secondary | ICD-10-CM | POA: Diagnosis not present

## 2019-07-10 DIAGNOSIS — M450 Ankylosing spondylitis of multiple sites in spine: Secondary | ICD-10-CM | POA: Diagnosis not present

## 2019-07-13 ENCOUNTER — Other Ambulatory Visit: Payer: Self-pay | Admitting: Sports Medicine

## 2019-07-13 ENCOUNTER — Ambulatory Visit: Payer: Medicare Other | Attending: Internal Medicine

## 2019-07-13 DIAGNOSIS — I4891 Unspecified atrial fibrillation: Secondary | ICD-10-CM

## 2019-07-13 DIAGNOSIS — Z20822 Contact with and (suspected) exposure to covid-19: Secondary | ICD-10-CM

## 2019-07-13 MED ORDER — METOPROLOL SUCCINATE ER 25 MG PO TB24: 25.0000 mg | ORAL_TABLET | Freq: Every day | ORAL | 11 refills | Status: DC

## 2019-07-14 LAB — NOVEL CORONAVIRUS, NAA: SARS-CoV-2, NAA: NOT DETECTED

## 2019-07-25 ENCOUNTER — Telehealth (INDEPENDENT_AMBULATORY_CARE_PROVIDER_SITE_OTHER): Payer: Medicare Other | Admitting: Sports Medicine

## 2019-07-25 DIAGNOSIS — G629 Polyneuropathy, unspecified: Secondary | ICD-10-CM

## 2019-07-25 DIAGNOSIS — G603 Idiopathic progressive neuropathy: Secondary | ICD-10-CM

## 2019-07-25 NOTE — Assessment & Plan Note (Signed)
Since he responds to gabapentin and notes worse sxs when he misses a dose we will try a gradual increase in dose to see if it lessens numbness and improves balance  Go to 300 QID Call in 2 weeks If some improvement titrate upward 300 q 2 wks

## 2019-07-25 NOTE — Progress Notes (Signed)
Patient agrees to [employer] video visit.  Primary concern are medications and understands that exam is limited.  Patient at office.  Physician in [facility] office  CC: neuropathy issues/ 78 yo male  Patient has remained pretty stable with numbness Now extends just above the ankles feels that his balance is gradually worse He does practice balance exercises  He is noting that if he misses a gabapentin dose that his numbness increases Today when he was delayed in taking the dose he got worse numbness This also seems to affect balance  Recently started Multaq - dronedarone  - for chronic AFib control Does not thin he has noted any change in his sxs related to the new medication  Remains active with walking and that makes legs feel better With inactive days he notes some aching in legs  ROS Notes pulse irregularity when his AF flares Back stiffness is stilled controlled with flexion exercises Foot and heel pain mostly resolved

## 2019-08-02 ENCOUNTER — Ambulatory Visit: Payer: Medicare Other | Attending: Internal Medicine

## 2019-08-02 DIAGNOSIS — Z20822 Contact with and (suspected) exposure to covid-19: Secondary | ICD-10-CM

## 2019-08-04 LAB — NOVEL CORONAVIRUS, NAA: SARS-CoV-2, NAA: NOT DETECTED

## 2019-08-05 ENCOUNTER — Ambulatory Visit: Payer: Medicare Other | Attending: Internal Medicine

## 2019-08-05 DIAGNOSIS — Z23 Encounter for immunization: Secondary | ICD-10-CM | POA: Diagnosis not present

## 2019-08-05 NOTE — Progress Notes (Signed)
   [id number]ination [facility]  Name:  Wesley [name].    MRN: [id number] DOB: 23-Dec-1940  08/05/2019  Wesley [name] was observed post Covid-19 immunization for 30 minutes based on pre-vaccination screening without incidence. He was provided with Vaccine Information Sheet and instruction to access the V-Safe system.   Wesley [name] was instructed to call 911 with any severe reactions post vaccine: Marland Kitchen Difficulty breathing  . Swelling of your face and throat  . A fast heartbeat  . A bad rash all over your body  . Dizziness and weakness    Immunizations Administered    Name Date Dose VIS Date Route   Pfizer COVID-19 Vaccine 08/05/2019  1:39 PM 0.3 mL 07/07/2019 Intramuscular   Manufacturer: [employer], [employer]   Lot: [id number]   [place]: [id number]

## 2019-08-11 DIAGNOSIS — Z7901 Long term (current) use of anticoagulants: Secondary | ICD-10-CM | POA: Diagnosis not present

## 2019-08-11 DIAGNOSIS — Z5181 Encounter for therapeutic drug level monitoring: Secondary | ICD-10-CM | POA: Diagnosis not present

## 2019-08-11 DIAGNOSIS — I4819 Other persistent atrial fibrillation: Secondary | ICD-10-CM | POA: Diagnosis not present

## 2019-08-11 DIAGNOSIS — Z9889 Other specified postprocedural states: Secondary | ICD-10-CM | POA: Diagnosis not present

## 2019-08-11 DIAGNOSIS — Z79899 Other long term (current) drug therapy: Secondary | ICD-10-CM | POA: Diagnosis not present

## 2019-08-11 DIAGNOSIS — Z8679 Personal history of other diseases of the circulatory system: Secondary | ICD-10-CM | POA: Diagnosis not present

## 2019-08-23 ENCOUNTER — Telehealth: Payer: Self-pay

## 2019-08-23 MED ORDER — GABAPENTIN 300 MG PO CAPS: 300.0000 mg | ORAL_CAPSULE | Freq: Four times a day (QID) | ORAL | 2 refills | Status: DC

## 2019-08-23 NOTE — Telephone Encounter (Signed)
Refill sent to pharmacy.   

## 2019-08-25 ENCOUNTER — Ambulatory Visit: Payer: Medicare Other

## 2019-08-26 ENCOUNTER — Ambulatory Visit: Payer: Medicare Other | Attending: Internal Medicine

## 2019-08-26 DIAGNOSIS — Z23 Encounter for immunization: Secondary | ICD-10-CM

## 2019-09-12 DIAGNOSIS — H353122 Nonexudative age-related macular degeneration, left eye, intermediate dry stage: Secondary | ICD-10-CM | POA: Diagnosis not present

## 2019-09-12 DIAGNOSIS — H353211 Exudative age-related macular degeneration, right eye, with active choroidal neovascularization: Secondary | ICD-10-CM | POA: Diagnosis not present

## 2019-09-12 DIAGNOSIS — H2513 Age-related nuclear cataract, bilateral: Secondary | ICD-10-CM | POA: Diagnosis not present

## 2019-10-04 NOTE — Progress Notes (Signed)
Virtual Visit via Telephone Note   This visit type was conducted due to national recommendations for restrictions regarding the COVID-19 Pandemic (e.g. social distancing) in an effort to limit this patient's exposure and mitigate transmission in our community.  Due to his co-morbid illnesses, this patient is at least at moderate risk for complications without adequate follow up.  This format is felt to be most appropriate for this patient at this time.  The patient did not have access to video technology/had technical difficulties with video requiring transitioning to audio format only (telephone).  All issues noted in this document were discussed and addressed.  No physical exam could be performed with this format.  Please refer to the patient's chart for his  consent to telehealth for [facility].  Evaluation Performed:  Follow-up visit  This visit type was conducted due to national recommendations for restrictions regarding the COVID-19 Pandemic (e.g. social distancing).  This format is felt to be most appropriate for this patient at this time.  All issues noted in this document were discussed and addressed.  No physical exam was performed (except for noted visual exam findings with Video Visits).  Please refer to the patient's chart (MyChart message for video visits and phone note for telephone visits) for the patient's consent to telehealth for [facility]  Date:  10/05/2019   ID:  Wesley [name]., DOB 1941-02-24, MRN [id number]  Patient Location:  [address] [place] [address]   Provider location:      [place] [address] Office [phone] Fax [phone]   PCP:  Shon Baton, MD  Cardiologist:  Kirk Ruths, MD  Electrophysiologist:  None   Chief Complaint: Follow-up  History of Present Illness:    Wesley [name]. is a 79 y.o. male who presents via audio/video conferencing for a telehealth visit  today.  Patient verified DOB and address.  The patient does not symptoms concerning for COVID-19 infection (fever, chills, cough, or new SHORTNESS OF BREATH).   His PMH also includes atrial fibrillation, OSA, peripheral neuropathy, polyneuropathy, DDD, hypercholesterolemia, and orthostatic dizziness He underwent successful A. fib ablation on 02/19/2010 in [place].  His echocardiogram April 2019 showed normal LV function, mildly dilated aortic root, trivial mitral prolapse, mild to moderate right ventricular enlargement and moderate tricuspid regurgitation.  He was seen on 02/02/2019.  During that time he felt he was back in atrial fibrillation.  He has been using his cardio mobile monitor that showed possible atrial fibrillation.  However, his EKG showed sinus rhythm with sinus arrhythmia, PACs and first-degree AV block.  He continued to be followed by [facility] and by Dr. Rolland Porter for his atrial fibrillation  He presented to the clinic on 04/2019 and stated he had taken sildenafil and since that time his heart rate has been elevated in the 120 bpm range.  He reported dizziness with changes in position.  He continued to be compliant with his Eliquis and metoprolol.  He was concerning 2 cups of coffee in the morning and had been staying physically active until his increased heart rate.  At that time he continued to perform all of his normal daily activities however, he felt like he had less energy.  His EKG showed 2-1 atrial flutter with a heart rate of 132 bpm.  His metoprolol succinate was increased in hopes that he may convert on its own.  However, he presented to the emergency department on 05/03/2019  and underwent a successful DCCV at that time.  He followed up with atrial fibrillation clinic on 05/08/2019.  He was maintaining sinus rhythm at that time and felt well.  He stated he wished to follow with Dr. Rolland Porter at [facility] and was told to  follow-up as needed.  Is seen virtually today and  states he has felt unwell since his DCCV in October 2020.  He has been started on Multaq 400 mg twice daily by Dr. Rolland Porter.  He has adjusted well to the medication.  He continues to walk at her over 5000 steps daily.  He continues to drink 2 to 3 cups of coffee per day and limits his EtOH.  He states that this was okay by Dr. Rolland Porter.  He states he has a follow-up in June at [facility] to discuss long-term plan of care for his atrial fibrillation and whether he wants to do another ablation, continue Multaq, or do nothing with his atrial fibrillation.  I have requested that he send his EKG from [facility] after his visit.  He states he has received his COVID-19 vaccinations.  Today he denies chest pain, shortness of breath, lower extremity edema, fatigue, palpitations, melena, hematuria, hemoptysis, diaphoresis, weakness, presyncope, syncope, orthopnea, and PND.   Prior CV studies:   The following studies were reviewed today:  2-1 atrial flutter 132 bpm  EKG 02/02/2019 sinus rhythm with sinus arrhythmia, PACs and first-degree AV block, history of incomplete RBBB 60 bpm- No acute changes  EKG 10/18/2017: Sinus rhythm with first-degree AV block 69 bpm  EKG 05/01/2016: Normal sinus rhythm 75 bpm  Echocardiogram 10/26/2017: Study Conclusions  - Left ventricle: The cavity size was normal. Systolic function was normal. The estimated ejection fraction was in the range of 60% to 65%. Wall motion was normal; there were no regional wall motion abnormalities. Features are consistent with a pseudonormal left ventricular filling pattern, with concomitant abnormal relaxation and increased filling pressure (grade 2 diastolic dysfunction). Doppler parameters are consistent with indeterminate ventricular filling pressure. - Aortic valve: Transvalvular velocity was within the normal range. There was no stenosis. There was no regurgitation. - Aorta: Ascending aortic diameter: 36 mm (S). - Ascending  aorta: The ascending aorta was at the upper limit of normal. - Mitral valve: Trivial prolapse, involving the anterior leaflet. Transvalvular velocity was within the normal range. There was no evidence for stenosis. There was no regurgitation. - Right ventricle: The cavity size was normal. Wall thickness was normal. Systolic function was normal. - Atrial septum: No defect or patent foramen ovale was identified by color flow Doppler. - Tricuspid valve: There was moderate regurgitation. - Pulmonary arteries: Systolic pressure was mildly increased. PA peak pressure: 46 mm Hg (S).  Past Medical History:  Diagnosis Date  . A-fib [facility])    cardioversion x2  . Ankylosing spondylitis ([place])   . Atrial flutter ([place])   . GERD (gastroesophageal reflux disease)   . Hemorrhoids   . Irregular heart beat   . MVP (mitral valve prolapse)    WITH A MIDSYSTOLIC CLICK  . OSA (obstructive sleep apnea)    moderate OSA NPSG 10/24/09 - AHO 21.8/hr  . Peripheral neuropathy   . Vitamin B12 deficiency    Past Surgical History:  Procedure Laterality Date  . ABLATION    . COLONOSCOPY    . INGUINAL HERNIA REPAIR    . venous ligation     for varices left lower leg     Current Meds  Medication Sig  . acidophilus (  RISAQUAD) CAPS capsule Take 1 capsule by mouth daily.  . Alpha-Lipoic Acid 600 MG CAPS Take 600 mg by mouth daily.  . Ascorbic Acid (VITAMIN C) 1000 MG tablet Take 1,000 mg by mouth daily.    Marland Kitchen b complex vitamins capsule Take 1 capsule by mouth daily.    . Cyanocobalamin 5000 MCG SUBL Place 1 tablet under the tongue daily.   Marland Kitchen dronedarone (MULTAQ) 400 MG tablet Take 400 mg by mouth 2 (two) times daily with a meal.  . ELIQUIS 5 MG TABS tablet TAKE 1 TABLET TWICE A DAY  . gabapentin (NEURONTIN) 300 MG capsule Take 1 capsule (300 mg total) by mouth 4 (four) times daily.  . Loperamide-Simethicone 2-125 MG TABS Take 1 tablet by mouth as needed.   . metoprolol succinate (TOPROL XL) 25 MG  24 hr tablet Take 1 tablet (25 mg total) by mouth daily.  . Multiple Vitamin (MULTIVITAMIN) tablet Take 1 tablet by mouth daily.    . Multiple Vitamins-Minerals (PRESERVISION AREDS 2+MULTI VIT) CAPS Take 1 tablet by mouth daily.   Marland Kitchen NEXIUM 40 MG capsule TAKE 1 CAPSULE DAILY  . NON FORMULARY Take 2 capsules by mouth daily. MITOQ  . pyridOXINE (VITAMIN B-6) 50 MG tablet Take 50 mg by mouth 2 (two) times daily.  . rosuvastatin (CRESTOR) 5 MG tablet Take 5 mg by mouth daily. Taking 1/2 tab every other day     Allergies:   Patient has no known allergies.   Social History   Tobacco Use  . Smoking status: Former Smoker    Packs/day: 0.00    Types: Cigarettes    Quit date: 07/27/1969    Years since quitting: 50.2  . Smokeless tobacco: Never Used  . Tobacco comment: less than 1 pack/day  Substance Use Topics  . Alcohol use: Yes    Comment: 1-2 most days  . Drug use: No     Family Hx: The patient's family history includes Arthritis in his father; Cancer in his father; Heart failure in his mother; Hypertension in his mother; Prostate cancer in his father.  ROS:   Please see the history of present illness.     All other systems reviewed and are negative.   Labs/Other Tests and Data Reviewed:    Recent Labs: 05/03/2019: BUN 13; Creatinine, Ser 0.84; Hemoglobin 14.5; Platelets 278; Potassium 4.1; Sodium 138   Recent Lipid Panel Lab Results  Component Value Date/Time   CHOL 209 (H) 06/20/2013 08:56 AM   TRIG 73.0 06/20/2013 08:56 AM   HDL 57.10 06/20/2013 08:56 AM   CHOLHDL 4 06/20/2013 08:56 AM   LDLCALC 125 (H) 10/19/2012 08:36 AM   LDLDIRECT 144.2 06/20/2013 08:56 AM    Wt Readings from Last 3 Encounters:  10/05/19 211 lb (95.7 kg)  07/25/19 210 lb (95.3 kg)  05/08/19 214 lb (97.1 kg)     Exam:    Vital Signs:  Ht 6\' 3"  (1.905 m)   Wt 211 lb (95.7 kg)   BMI 26.37 kg/m    Well nourished, well developed male in no  acute distress.   ASSESSMENT & PLAN:    1.   Atrial fibrillation- unable to check pulse. Loaned his BP cuff to his pregnant daughter in [employer]. Continue apixaban 5 mg tablet twice daily Continue metoprolol succinate 25 mg tablet daily Continue Multaq 400 mg tablet twice daily Maintain daily activities Avoid caffeine, chocolate, alcohol, and to continue to wear CPAP. Heart healthy low-sodium diet increase physical activity as tolerated  Palpitations-no  recurrent episodes of palpitations.   No increased shortness of breath Continue apixaban 5 mg tablet twice daily Continue metoprolol succinate 25 mg tablet daily Continue Multaq 400 mg tablet twice daily Maintain daily activities Avoid caffeine, chocolate, alcohol, and to continue to wear CPAP. Heart healthy low-sodium diet Increase physical activity as tolerated  Hypercholesterolemia-direct LDL 144.2 (06/20/2013) Continue rosuvastatin 5 mg tablet daily Heart healthy low-sodium high-fiber diet Monitored by PCP  Disposition: Follow-up with Dr. Stanford Breed in 6 months.  COVID-19 Education: The signs and symptoms of COVID-19 were discussed with the patient and how to seek care for testing (follow up with PCP or arrange E-visit).  The importance of social distancing was discussed today.  Patient Risk:   After full review of this patients clinical status, I feel that they are at least moderate risk at this time.  Time:   Today, I have spent 17 minutes with the patient with telehealth technology discussing atrial fibrillation, diet, exercise, medication.     Medication Adjustments/Labs and Tests Ordered: Current medicines are reviewed at length with the patient today.  Concerns regarding medicines are outlined above.   Tests Ordered: No orders of the defined types were placed in this encounter.  Medication Changes: No orders of the defined types were placed in this encounter.   Disposition:  in 6 month(s)  Signed, Jossie Ng. [employer]  [facility] [address] Office [phone] Fax [phone]

## 2019-10-05 ENCOUNTER — Telehealth: Payer: Medicare Other | Admitting: Cardiology

## 2019-10-05 ENCOUNTER — Telehealth (INDEPENDENT_AMBULATORY_CARE_PROVIDER_SITE_OTHER): Payer: Medicare Other | Admitting: General Practice

## 2019-10-05 VITALS — Ht 75.0 in | Wt 211.0 lb

## 2019-10-05 DIAGNOSIS — E7800 Pure hypercholesterolemia, unspecified: Secondary | ICD-10-CM | POA: Diagnosis not present

## 2019-10-05 DIAGNOSIS — R002 Palpitations: Secondary | ICD-10-CM

## 2019-10-05 DIAGNOSIS — I480 Paroxysmal atrial fibrillation: Secondary | ICD-10-CM | POA: Diagnosis not present

## 2019-10-05 NOTE — Patient Instructions (Addendum)
Medication Instructions:  The current medical regimen is effective;  continue present plan and medications as directed. Please refer to the Current Medication list given to you today. If you need a refill on your cardiac medications before your next appointment, please call your pharmacy.  Special Instructions: INCREASE PHYSICAL ACTIVITY AS TOLERATED                       IT-MYCHART [phone]  PLEASE FOLLOW HEART HEALTHY DIET ATTACHED  PLEASE READ AND FOLLOW SALTY 6 ATTACHED  PLEASE READ AND FOLLOW AFIB TRIGGERS  Follow-Up: 6 months Please call our office 2 months in advance, JUL 2021 to schedule this SEPT 2021 appointment. In Person Kirk Ruths, MD.    At [facility], you and your health needs are our priority.  As part of our continuing mission to provide you with exceptional heart care, we have created designated Provider Care Teams.  These Care Teams include your primary Cardiologist (physician) and Advanced Practice Providers (APPs -  Physician Assistants and Nurse Practitioners) who all work together to provide you with the care you need, when you need it.  Reduce your risk of getting COVID-19 With your heart disease it is especially important for people at increased risk of severe illness from COVID-19, and those who live with them, to protect themselves from getting COVID-19. The best way to protect yourself and to help reduce the spread of the virus that causes COVID-19 is to: Marland Kitchen Limit your interactions with other people as much as possible. . Take COVID-19 when you do interact with others. If you start feeling sick and think you may have COVID-19, get in touch with your healthcare provider within 24 hours.  Thank you for choosing [facility] at [facility]!!         HEART HEALTHY DIET Getting too much fat and cholesterol in your diet may cause health problems. Choosing the right foods helps keep your fat and cholesterol at normal levels. This can keep you from  getting certain diseases. What are tips for following this plan? Meal planning  At meals, divide your plate into four equal parts: ? Fill one-half of your plate with vegetables and green salads. ? Fill one-fourth of your plate with whole grains. ? Fill one-fourth of your plate with low-fat (lean) protein foods.  Eat fish that is high in omega-3 fats at least two times a week. This includes mackerel, tuna, sardines, and salmon.  Eat foods that are high in fiber, such as whole grains, beans, apples, broccoli, carrots, peas, and barley. General tips   Work with your doctor to lose weight if you need to.  Avoid: ? Foods with added sugar. ? Fried foods. ? Foods with partially hydrogenated oils.  Limit alcohol intake to no more than 1 drink a day for nonpregnant women and 2 drinks a day for men. One drink equals 12 oz of beer, 5 oz of wine, or 1 oz of hard liquor. Reading food labels  Check food labels for: ? Trans fats. ? Partially hydrogenated oils. ? Saturated fat (g) in each serving. ? Cholesterol (mg) in each serving. ? Fiber (g) in each serving.  Choose foods with healthy fats, such as: ? Monounsaturated fats. ? Polyunsaturated fats. ? Omega-3 fats.  Choose grain products that have whole grains. Look for the word "whole" as the first word in the ingredient list. Cooking  Cook foods using low-fat methods. These include baking, boiling, grilling, and broiling.  Eat more home-cooked  foods. Eat at restaurants and buffets less often.  Avoid cooking using saturated fats, such as butter, cream, palm oil, palm kernel oil, and coconut oil. Recommended foods  Fruits  All fresh, canned (in natural juice), or frozen fruits. Vegetables  Fresh or frozen vegetables (raw, steamed, roasted, or grilled). Green salads. Grains  Whole grains, such as whole wheat or whole grain breads, crackers, cereals, and pasta. Unsweetened oatmeal, bulgur, barley, quinoa, or brown rice. Corn or  whole wheat flour tortillas. Meats and other protein foods  Ground beef (85% or leaner), grass-fed beef, or beef trimmed of fat. Skinless chicken or [place]. Ground chicken or [place]. Pork trimmed of fat. All fish and seafood. Egg whites. Dried beans, peas, or lentils. Unsalted nuts or seeds. Unsalted canned beans. Nut butters without added sugar or oil. Dairy  Low-fat or nonfat dairy products, such as skim or 1% milk, 2% or reduced-fat cheeses, low-fat and fat-free ricotta or cottage cheese, or plain low-fat and nonfat yogurt. Fats and oils  Tub margarine without trans fats. Light or reduced-fat mayonnaise and salad dressings. Avocado. Olive, canola, sesame, or safflower oils. The items listed above may not be a complete list of foods and beverages you can eat. Contact a dietitian for more information. Foods to avoid Fruits  Canned fruit in heavy syrup. Fruit in cream or butter sauce. Fried fruit. Vegetables  Vegetables cooked in cheese, cream, or butter sauce. Fried vegetables. Grains  White bread. White pasta. White rice. Cornbread. Bagels, pastries, and croissants. Crackers and snack foods that contain trans fat and hydrogenated oils. Meats and other protein foods  Fatty cuts of meat. Ribs, chicken wings, bacon, sausage, bologna, salami, chitterlings, fatback, hot dogs, bratwurst, and packaged lunch meats. Liver and organ meats. Whole eggs and egg yolks. Chicken and [place] with skin. Fried meat. Dairy  Whole or 2% milk, cream, half-and-half, and cream cheese. Whole milk cheeses. Whole-fat or sweetened yogurt. Full-fat cheeses. Nondairy creamers and whipped toppings. Processed cheese, cheese spreads, and cheese curds. Beverages  Alcohol. Sugar-sweetened drinks such as sodas, lemonade, and fruit drinks. Fats and oils  Butter, stick margarine, lard, shortening, ghee, or bacon fat. Coconut, palm kernel, and palm oils. Sweets and desserts  Corn syrup, sugars, honey, and molasses.  Candy. Jam and jelly. Syrup. Sweetened cereals. Cookies, pies, cakes, donuts, muffins, and ice cream. The items listed above may not be a complete list of foods and beverages you should avoid. Contact a dietitian for more information. Summary  Choosing the right foods helps keep your fat and cholesterol at normal levels. This can keep you from getting certain diseases.  At meals, fill one-half of your plate with vegetables and green salads.  Eat high-fiber foods, like whole grains, beans, apples, carrots, peas, and barley.  Limit added sugar, saturated fats, alcohol, and fried foods. This information is not intended to replace advice given to you by your health care provider. Make sure you discuss any questions you have with your health care provider.  Preventing Atrial Fibrillation  Atrial fibrillation is a common type of irregular or rapid heartbeat (arrhythmia) that greatly increases your risk for a stroke. In atrial fibrillation, the top portions of the heart (atria) beat out of sync with the lower portions of the heart. When the muscles of the atria are tightening in an uncoordinated way (fibrillating), blood can pool in the heart and form clots. If a clot travels to the brain, it can cause a stroke. This type of stroke is preventable. Understanding atrial fibrillation  and knowing how to properly manage it can prevent you from having a stroke. What increases my risk for a stroke? If you have atrial fibrillation, you may be at increased risk for a stroke if you also:  Have heart failure.  Have high blood pressure.  Are older than age 65.  Have diabetes.  Have a history of vascular disease, such as heart attack or stroke.  Are male. If you have atrial fibrillation and you also have one or more of those risk factors, talk with your health care provider about treatments that can prevent a stroke. Other risk factors for a stroke include:  Smoking.  High  cholesterol.  Diabetes.  Being inactive (sedentary lifestyle).  Having a family history of stroke.  Eating a diet that is high in fat, cholesterol, and salt. What treatments help to manage atrial fibrillation? The main goals of treatment for atrial fibrillation are to prevent blood clots from forming and to keep your heart beating at a normal rate and rhythm. Treatment may include:  Blood-thinning medicine (anticoagulant) that helps to prevent clots from forming. This medicine also increases the risk of bleeding. Talk with your health care provider about the risks and benefits of taking anticoagulants.  Medicine that slows the heart rate or brings the heart rhythm back to normal.  Electrical cardioversion. This is a procedure that resets the heart's rhythm by delivering a controlled, low-energy shock through your skin to your heart.  An ablation procedure, such as catheter ablation, catheter ablation with pacemaker, or surgical ablation. These procedures destroy the heart tissues that send abnormal signals so that heart rhythms can be improved or made normal. A pacemaker is a device that is placed under the skin to help the heart beat in a regular rhythm. How can I prevent atrial fibrillation? Medicines  Take over-the-counter and prescription medicines only as told by your health care provider.  If your health care provider prescribed an anticoagulant, take it exactly as told. Taking too much blood-thinning medicine can cause bleeding. If you do not take enough blood-thinning medicine, you will not have the protection that you need against stroke and other problems. Eating and drinking  Eat healthy foods, including at least 5 servings of fruits and vegetables a day.  Do not drink alcohol.  Do not drink beverages that contain caffeine, such as coffee, soda, and tea.  Follow dietary instructions as told by your health care provider. Managing other medical conditions  Manage and be  aware of your blood pressure. If you have high blood pressure, follow your treatment plan to keep it in your target range.  Have your cholesterol checked as often as recommended by your health care provider. If you have high cholesterol, follow your treatment plan to lower it and keep it in your target range.  Talk with your health care provider about symptoms to watch for. Some people may not have any symptoms, so it can be hard to know that they have atrial fibrillation. Talk with your health care provider if you experience: ? A feeling that your heart is beating rapidly or irregularly. ? An irregular pulse. ? A feeling of discomfort or pain in your chest. ? Shortness of breath. ? Sudden light-headedness or weakness. ? Tiredness (fatigue) that happens easily during exercise.  If you have obstructive sleep apnea (OSA), manage your condition as told by your health care provider. General instructions  Maintain a healthy weight. Do not use diet pills unless your health care provider approves. Diet  pills may make heart problems worse.  Exercise regularly. Get at least 30 minutes of activity on most or all days, or as told by your health care provider.  Do not use any products that contain nicotine or tobacco, such as cigarettes and e-cigarettes. If you need help quitting, ask your health care provider.  Do not use drugs, such as cocaine and amphetamines.  Keep all follow-up visits as told by your health care providers. This is important. These include visits with your heart specialist. Where to find more information You may find more information about preventing atrial fibrillation-related stroke from:  National Stroke Association (AFib-Stroke Connection): www.stroke.org Contact a health care provider if:  You notice a change in the rate, rhythm, or strength of your heartbeat.  You have dizziness.  You are taking an anticoagulant and you have more bruises than usual.  You tire out  more easily when you exercise or do similar activities. Get help right away if:   You have chest pain.  You have pain in your abdomen.  You experience unusual sweating or weakness.  You take anticoagulants and you: ? Have severe headaches or confusion. ? Have blood in your vomit, bowel movement, or urine. ? Have bleeding that will not stop. ? Fall or injure your head.  You have any symptoms of a stroke. "BE FAST" is an easy way to remember the main warning signs of a stroke: ? B - Balance. Signs are dizziness, trouble walking, or loss of balance. ? E - Eyes. Signs are trouble seeing or a sudden change in vision. ? F - Face. Signs are sudden weakness or numbness of the face, or the face or eyelid drooping on one side. ? A - Arms. Signs are weakness or numbness in an arm. This happens suddenly and usually on one side of the body. ? S - Speech. Signs are sudden trouble speaking, slurred speech, or trouble understanding what people say. ? T - Time. Time to call emergency services. Write down what time symptoms started.  You have other signs of a stroke, such as: ? A sudden, severe headache with no known cause. ? Nausea or vomiting. ? Seizure. These symptoms may represent a serious problem that is an emergency. Do not wait to see if the symptoms will go away. Get medical help right away. Call your local emergency services (911 in the [place] Do not drive yourself to the hospital. Summary  Having atrial fibrillation increases the risk for a stroke. Talk with your health care provider about what symptoms to watch for.  Atrial fibrillation-related stroke is preventable. Proper management of atrial fibrillation can prevent you from having a stroke.  Talk with your health care provider about whether anticoagulant medicine is right for you.  Learn the warning signs of a stroke and remember "BE FAST." This information is not intended to replace advice given to you by your health care  provider. Make sure you discuss any questions you have with your health care provider.

## 2019-10-12 ENCOUNTER — Other Ambulatory Visit: Payer: Self-pay

## 2019-10-12 ENCOUNTER — Ambulatory Visit: Payer: Self-pay

## 2019-10-12 ENCOUNTER — Ambulatory Visit (INDEPENDENT_AMBULATORY_CARE_PROVIDER_SITE_OTHER): Payer: Medicare Other | Admitting: Sports Medicine

## 2019-10-12 VITALS — BP 126/78 | Ht 75.0 in | Wt 214.0 lb

## 2019-10-12 DIAGNOSIS — M25512 Pain in left shoulder: Secondary | ICD-10-CM

## 2019-10-12 DIAGNOSIS — M19012 Primary osteoarthritis, left shoulder: Secondary | ICD-10-CM

## 2019-10-12 NOTE — Progress Notes (Signed)
    SUBJECTIVE:   CHIEF COMPLAINT / HPI:   Left shoulder pain Patient reports that he has been feeling pain at his upper lateral left arm that sometimes feels as if it is in his shoulder.  He believes this has been going on for several months and likely prior to January.  He did read somewhere that the vaccine could cause residual pain.  Does not recall injury No night pain or neck pain   Bilateral right medial toe pain Patient reports that he has been having toe pain on the medial aspect of the edge of his great toenails.  PERTINENT  PMH / PSH: A. fib on Eliquis, right glenohumeral arthritis  OBJECTIVE:   BP 126/78   Ht 6\' 3"  (1.905 m)   Wt 214 lb (97.1 kg)   BMI 26.75 kg/m   General: Well-appearing male, no acute distress Cervical spine: No gross abnormalities. No TTP of bony prominences.  Shoulder Exam: No gross abnormalities on inspection. No TTP of bony prominences.  No TTP of major muscle groups.  Patient does have localized tenderness of posterior arm just above radial groove.  Left shoulder with nearly full forward elevation to 175 degrees.  External rotation in neutral position to 60 degrees.  He has 5 out of 5 strength in all muscle groups.  Empty can negative, Neer's negative.  Patient does have positive Hawkins. 5/5 strength of bicep, tricep, flexor and extensor muscles.  No pain or numbness elicited with tapping to cubital tunnel. Feet: Callused areas of bilateral medial distal toe bed.  No obvious erythema or drainage.  There is tenderness to palpation.  Ultrasound of Left Shoulder Biceps tendon normal short and long axis AC joint with narrowing. Hypoechoic effusion Supraspinatus tendon normal but irregularity and spurring noted at humeral head and the supraspinatus tendon is hypoechoic above this suggesting swelling Subscapularis, infraspinatus and terres minor are normal Remainder of [place] joint looks normal  Impression: arthritic change at glenohumeral head with  some impingement on supraspinatus tendon  Ultrasound and interpretation by Dr. Maudie Mercury and Wolfgang Phoenix. Fields, MD  ASSESSMENT/PLAN:   1.  Left humeral head spurring On ultrasound today, while visualizing supraspinatus with cross arm, patient has some bony irregularity at the surface of his humeral head.  This is likely what is causing his pain.  Patient does not have any abnormality of tendon or insertion points.  There is no obvious arthritis at the remainder of glenohumeral space.  The ultrasound is also significant for [facility] arthritis on left side with mild effusion.  Patient otherwise has good strength on physical exam.  Given no formal rotator cuff tear or injury, will have patient continue to do exercises to maintain range of motion.  Exercises include arm circles with arms slightly abducted, internal and external rotation exercises, abduction to 90 degrees with small weeks. Patient should follow up in 6 to 8 weeks if he is not having any improvement and we will obtain x-rays.  2.  Bilateral great toe callus on medial nailbed There is no signs of infection or obvious ingrown toe.  Advised patient to keep area moisturized and clean.  He should use wax floss to help keep the nail separate from the callus area.   Wilber Oliphant, MD [place]   I observed and examined the patient with the resident and agree with assessment and plan.  Note reviewed and modified by me. Ila Mcgill, MD

## 2019-10-24 ENCOUNTER — Ambulatory Visit: Payer: Medicare Other | Admitting: Sports Medicine

## 2019-11-14 DIAGNOSIS — H353122 Nonexudative age-related macular degeneration, left eye, intermediate dry stage: Secondary | ICD-10-CM | POA: Diagnosis not present

## 2019-11-14 DIAGNOSIS — H353211 Exudative age-related macular degeneration, right eye, with active choroidal neovascularization: Secondary | ICD-10-CM | POA: Diagnosis not present

## 2019-11-14 DIAGNOSIS — H2513 Age-related nuclear cataract, bilateral: Secondary | ICD-10-CM | POA: Diagnosis not present

## 2019-11-14 DIAGNOSIS — H43813 Vitreous degeneration, bilateral: Secondary | ICD-10-CM | POA: Diagnosis not present

## 2019-12-19 ENCOUNTER — Other Ambulatory Visit: Payer: Self-pay

## 2019-12-19 ENCOUNTER — Other Ambulatory Visit: Payer: Self-pay | Admitting: *Deleted

## 2019-12-19 ENCOUNTER — Other Ambulatory Visit (HOSPITAL_COMMUNITY)
Admission: RE | Admit: 2019-12-19 | Discharge: 2019-12-19 | Disposition: A | Discharge status: Home / Self Care | Payer: Medicare Other | Source: Ambulatory Visit | Attending: Cardiology | Admitting: Cardiology

## 2019-12-19 ENCOUNTER — Encounter: Payer: Self-pay | Admitting: Cardiology

## 2019-12-19 ENCOUNTER — Ambulatory Visit (INDEPENDENT_AMBULATORY_CARE_PROVIDER_SITE_OTHER): Payer: Medicare Other | Admitting: Cardiology

## 2019-12-19 VITALS — BP 120/60 | HR 128 | Temp 98.0°F | Ht 75.0 in | Wt 217.2 lb

## 2019-12-19 DIAGNOSIS — Z01812 Encounter for preprocedural laboratory examination: Secondary | ICD-10-CM | POA: Insufficient documentation

## 2019-12-19 DIAGNOSIS — I361 Nonrheumatic tricuspid (valve) insufficiency: Secondary | ICD-10-CM

## 2019-12-19 DIAGNOSIS — I480 Paroxysmal atrial fibrillation: Secondary | ICD-10-CM

## 2019-12-19 DIAGNOSIS — Z20822 Contact with and (suspected) exposure to covid-19: Secondary | ICD-10-CM | POA: Diagnosis not present

## 2019-12-19 DIAGNOSIS — R002 Palpitations: Secondary | ICD-10-CM | POA: Diagnosis not present

## 2019-12-19 LAB — SARS CORONAVIRUS 2 (TAT 6-24 HRS): SARS Coronavirus 2: NEGATIVE

## 2019-12-19 NOTE — Progress Notes (Signed)
HPI: Follow-up atrial fibrillation. Nuclear study March 2006 was normal. Had successful ablation of his atrial fibrillation on 02/19/10 in [place].Patient continues to be followed there. He has had intermittent infrequent episodes of paroxysmal atrial fibrillation since his ablation treated medically. Abdominal ultrasound March 2017 showed no aneurysm. Echocardiogram 4/19 showed normal LV function and moderate tricuspid regurgitation. Patient now maintained on dronedarone for atrial fibrillation.  Since last seen, patient developed recurrent palpitations 2 days ago.  His heart rate has been approximately 120-125.  He denies chest pain.  Mild dyspnea with more vigorous activities.  No syncope.  Current Outpatient Medications  Medication Sig Dispense Refill  . acidophilus (RISAQUAD) CAPS capsule Take 1 capsule by mouth daily.    . Alpha-Lipoic Acid 600 MG CAPS Take 600 mg by mouth daily.    . Ascorbic Acid (VITAMIN C) 1000 MG tablet Take 1,000 mg by mouth daily.      Marland Kitchen b complex vitamins capsule Take 1 capsule by mouth daily.      . Cyanocobalamin 5000 MCG SUBL Place 1 tablet under the tongue daily.     Marland Kitchen dronedarone (MULTAQ) 400 MG tablet Take 400 mg by mouth 2 (two) times daily with a meal.    . ELIQUIS 5 MG TABS tablet TAKE 1 TABLET TWICE A DAY 180 tablet 3  . gabapentin (NEURONTIN) 300 MG capsule Take 1 capsule (300 mg total) by mouth 4 (four) times daily. 360 capsule 2  . Loperamide-Simethicone 2-125 MG TABS Take 1 tablet by mouth as needed.     . meloxicam (MOBIC) 15 MG tablet Take 15 mg by mouth daily as needed for pain.    . metoprolol succinate (TOPROL XL) 25 MG 24 hr tablet Take 1 tablet (25 mg total) by mouth daily. 30 tablet 11  . Multiple Vitamin (MULTIVITAMIN) tablet Take 1 tablet by mouth daily.      . Multiple Vitamins-Minerals (PRESERVISION AREDS 2+MULTI VIT) CAPS Take 1 tablet by mouth daily.     Marland Kitchen NEXIUM 40 MG capsule TAKE 1 CAPSULE DAILY 90 capsule 3    . NON FORMULARY Take 2 capsules by mouth daily. MITOQ    . pyridOXINE (VITAMIN B-6) 50 MG tablet Take 50 mg by mouth 2 (two) times daily.    . rosuvastatin (CRESTOR) 5 MG tablet Take 5 mg by mouth daily. Taking 1/2 tab every other day     No current facility-administered medications for this visit.     Past Medical History:  Diagnosis Date  . A-fib [facility])    cardioversion x2  . Ankylosing spondylitis ([place])   . Atrial flutter ([place])   . GERD (gastroesophageal reflux disease)   . Hemorrhoids   . Irregular heart beat   . MVP (mitral valve prolapse)    WITH A MIDSYSTOLIC CLICK  . OSA (obstructive sleep apnea)    moderate OSA NPSG 10/24/09 - AHO 21.8/hr  . Peripheral neuropathy   . Vitamin B12 deficiency     Past Surgical History:  Procedure Laterality Date  . ABLATION    . COLONOSCOPY    . INGUINAL HERNIA REPAIR    . venous ligation     for varices left lower leg    Social History   Socioeconomic History  . Marital status: Married    Spouse name: Not on file  . Number of children: 2  . Years of education: 23  . Highest education level: Not on file  Occupational History  . Occupation: Press photographer  Employer: [employer]  Tobacco Use  . Smoking status: Former Smoker    Packs/day: 0.00    Types: Cigarettes    Quit date: 07/27/1969    Years since quitting: 50.4  . Smokeless tobacco: Never Used  . Tobacco comment: less than 1 pack/day  Substance and Sexual Activity  . Alcohol use: Yes    Comment: 1-2 most days  . Drug use: No  . Sexual activity: Not on file  Other Topics Concern  . Not on file  Social History Narrative  . Not on file   Social Determinants of Health   Financial Resource Strain:   . Difficulty of Paying Living Expenses:   Food Insecurity:   . Worried About Charity fundraiser in the Last Year:   . Arboriculturist in the Last Year:   Transportation Needs:   . Film/video editor (Medical):   Marland Kitchen Lack of Transportation (Non-Medical):    Physical Activity:   . Days of Exercise per Week:   . Minutes of Exercise per Session:   Stress:   . Feeling of Stress :   Social Connections:   . Frequency of Communication with Friends and Family:   . Frequency of Social Gatherings with Friends and Family:   . Attends Religious Services:   . Active Member of Clubs or Organizations:   . Attends Archivist Meetings:   Marland Kitchen Marital Status:   Intimate Partner Violence:   . Fear of Current or Ex-Partner:   . Emotionally Abused:   Marland Kitchen Physically Abused:   . Sexually Abused:     Family History  Problem Relation Age of Onset  . Prostate cancer Father   . Arthritis Father   . Cancer Father   . Heart failure Mother   . Hypertension Mother     ROS: no fevers or chills, productive cough, hemoptysis, dysphasia, odynophagia, melena, hematochezia, dysuria, hematuria, rash, seizure activity, orthopnea, PND, pedal edema, claudication. Remaining systems are negative.  Physical Exam: Well-developed well-nourished in no acute distress.  Skin is warm and dry.  HEENT is normal.  Neck is supple.  Chest is clear to auscultation with normal expansion.  Cardiovascular exam is regular rate and rhythm.  Abdominal exam nontender or distended. No masses palpated. Extremities show no edema. neuro grossly intact  ECG-atrial flutter at a rate of 128, left anterior fascicular block, no ST changes.  Personally reviewed  A/P  1 paroxysmal atrial fibrillation-patient is in atrial flutter today.  Heart rates in the 120s.  We will continue dronedarone and metoprolol.  He will continue apixaban and he has not missed any doses.  We will arrange an elective cardioversion.  He will then follow-up in [place] with Dr. Rolland Porter for further discussions concerning options of different antiarrhythmics versus ablation.  I will repeat echocardiogram.  2 history of moderate tricuspid regurgitation-we will plan repeat echocardiogram.  3 history of  palpitations-symptoms are controlled at present.  Continue present medications.    4 mitral valve prolapse-patient and mild mitral regurgitation on most recent echocardiogram.  Kirk Ruths, MD

## 2019-12-19 NOTE — H&P (View-Only) (Signed)
HPI: Follow-up atrial fibrillation. Nuclear study March 2006 was normal. Had successful ablation of his atrial fibrillation on 02/19/10 in [place].Patient continues to be followed there. He has had intermittent infrequent episodes of paroxysmal atrial fibrillation since his ablation treated medically. Abdominal ultrasound March 2017 showed no aneurysm. Echocardiogram 4/19 showed normal LV function and moderate tricuspid regurgitation. Patient now maintained on dronedarone for atrial fibrillation.  Since last seen, patient developed recurrent palpitations 2 days ago.  His heart rate has been approximately 120-125.  He denies chest pain.  Mild dyspnea with more vigorous activities.  No syncope.  Current Outpatient Medications  Medication Sig Dispense Refill  . acidophilus (RISAQUAD) CAPS capsule Take 1 capsule by mouth daily.    . Alpha-Lipoic Acid 600 MG CAPS Take 600 mg by mouth daily.    . Ascorbic Acid (VITAMIN C) 1000 MG tablet Take 1,000 mg by mouth daily.      Marland Kitchen b complex vitamins capsule Take 1 capsule by mouth daily.      . Cyanocobalamin 5000 MCG SUBL Place 1 tablet under the tongue daily.     Marland Kitchen dronedarone (MULTAQ) 400 MG tablet Take 400 mg by mouth 2 (two) times daily with a meal.    . ELIQUIS 5 MG TABS tablet TAKE 1 TABLET TWICE A DAY 180 tablet 3  . gabapentin (NEURONTIN) 300 MG capsule Take 1 capsule (300 mg total) by mouth 4 (four) times daily. 360 capsule 2  . Loperamide-Simethicone 2-125 MG TABS Take 1 tablet by mouth as needed.     . meloxicam (MOBIC) 15 MG tablet Take 15 mg by mouth daily as needed for pain.    . metoprolol succinate (TOPROL XL) 25 MG 24 hr tablet Take 1 tablet (25 mg total) by mouth daily. 30 tablet 11  . Multiple Vitamin (MULTIVITAMIN) tablet Take 1 tablet by mouth daily.      . Multiple Vitamins-Minerals (PRESERVISION AREDS 2+MULTI VIT) CAPS Take 1 tablet by mouth daily.     Marland Kitchen NEXIUM 40 MG capsule TAKE 1 CAPSULE DAILY 90 capsule 3    . NON FORMULARY Take 2 capsules by mouth daily. MITOQ    . pyridOXINE (VITAMIN B-6) 50 MG tablet Take 50 mg by mouth 2 (two) times daily.    . rosuvastatin (CRESTOR) 5 MG tablet Take 5 mg by mouth daily. Taking 1/2 tab every other day     No current facility-administered medications for this visit.     Past Medical History:  Diagnosis Date  . A-fib [facility])    cardioversion x2  . Ankylosing spondylitis ([place])   . Atrial flutter ([place])   . GERD (gastroesophageal reflux disease)   . Hemorrhoids   . Irregular heart beat   . MVP (mitral valve prolapse)    WITH A MIDSYSTOLIC CLICK  . OSA (obstructive sleep apnea)    moderate OSA NPSG 10/24/09 - AHO 21.8/hr  . Peripheral neuropathy   . Vitamin B12 deficiency     Past Surgical History:  Procedure Laterality Date  . ABLATION    . COLONOSCOPY    . INGUINAL HERNIA REPAIR    . venous ligation     for varices left lower leg    Social History   Socioeconomic History  . Marital status: Married    Spouse name: Not on file  . Number of children: 2  . Years of education: 25  . Highest education level: Not on file  Occupational History  . Occupation: Press photographer  Employer: [employer]  Tobacco Use  . Smoking status: Former Smoker    Packs/day: 0.00    Types: Cigarettes    Quit date: 07/27/1969    Years since quitting: 50.4  . Smokeless tobacco: Never Used  . Tobacco comment: less than 1 pack/day  Substance and Sexual Activity  . Alcohol use: Yes    Comment: 1-2 most days  . Drug use: No  . Sexual activity: Not on file  Other Topics Concern  . Not on file  Social History Narrative  . Not on file   Social Determinants of Health   Financial Resource Strain:   . Difficulty of Paying Living Expenses:   Food Insecurity:   . Worried About Charity fundraiser in the Last Year:   . Arboriculturist in the Last Year:   Transportation Needs:   . Film/video editor (Medical):   Marland Kitchen Lack of Transportation (Non-Medical):    Physical Activity:   . Days of Exercise per Week:   . Minutes of Exercise per Session:   Stress:   . Feeling of Stress :   Social Connections:   . Frequency of Communication with Friends and Family:   . Frequency of Social Gatherings with Friends and Family:   . Attends Religious Services:   . Active Member of Clubs or Organizations:   . Attends Archivist Meetings:   Marland Kitchen Marital Status:   Intimate Partner Violence:   . Fear of Current or Ex-Partner:   . Emotionally Abused:   Marland Kitchen Physically Abused:   . Sexually Abused:     Family History  Problem Relation Age of Onset  . Prostate cancer Father   . Arthritis Father   . Cancer Father   . Heart failure Mother   . Hypertension Mother     ROS: no fevers or chills, productive cough, hemoptysis, dysphasia, odynophagia, melena, hematochezia, dysuria, hematuria, rash, seizure activity, orthopnea, PND, pedal edema, claudication. Remaining systems are negative.  Physical Exam: Well-developed well-nourished in no acute distress.  Skin is warm and dry.  HEENT is normal.  Neck is supple.  Chest is clear to auscultation with normal expansion.  Cardiovascular exam is regular rate and rhythm.  Abdominal exam nontender or distended. No masses palpated. Extremities show no edema. neuro grossly intact  ECG-atrial flutter at a rate of 128, left anterior fascicular block, no ST changes.  Personally reviewed  A/P  1 paroxysmal atrial fibrillation-patient is in atrial flutter today.  Heart rates in the 120s.  We will continue dronedarone and metoprolol.  He will continue apixaban and he has not missed any doses.  We will arrange an elective cardioversion.  He will then follow-up in [place] with Dr. Rolland Porter for further discussions concerning options of different antiarrhythmics versus ablation.  I will repeat echocardiogram.  2 history of moderate tricuspid regurgitation-we will plan repeat echocardiogram.  3 history of  palpitations-symptoms are controlled at present.  Continue present medications.    4 mitral valve prolapse-patient and mild mitral regurgitation on most recent echocardiogram.  Kirk Ruths, MD

## 2019-12-19 NOTE — Anesthesia Preprocedure Evaluation (Addendum)
Anesthesia Evaluation  Patient identified by MRN, date of birth, ID band  Reviewed: Allergy & Precautions, NPO status , Patient's Chart, lab work & pertinent test results  Airway Mallampati: II  TM Distance: >3 FB     Dental   Pulmonary former smoker,    breath sounds clear to auscultation       Cardiovascular negative cardio ROS   Rhythm:Regular Rate:Normal     Neuro/Psych    GI/Hepatic Neg liver ROS, GERD  ,History noted CG   Endo/Other  negative endocrine ROS  Renal/GU negative Renal ROS     Musculoskeletal   Abdominal   Peds  Hematology   Anesthesia Other Findings   Reproductive/Obstetrics                            Anesthesia Physical Anesthesia Plan  ASA: III  Anesthesia Plan: General   Post-op Pain Management:    Induction: Intravenous  PONV Risk Score and Plan: 2 and Ondansetron  Airway Management Planned: Mask and Simple Face Mask  Additional Equipment:   Intra-op Plan:   Post-operative Plan:   Informed Consent: I have reviewed the patients History and Physical, chart, labs and discussed the procedure including the risks, benefits and alternatives for the proposed anesthesia with the patient or authorized representative who has indicated his/her understanding and acceptance.     Dental advisory given  Plan Discussed with: Anesthesiologist and CRNA  Anesthesia Plan Comments:        Anesthesia Quick Evaluation

## 2019-12-19 NOTE — Patient Instructions (Signed)
Medication Instructions:   NO CHANGE  *If you need a refill on your cardiac medications before your next appointment, please call your pharmacy*   Lab Work: If you have labs (blood work) drawn today and your tests are completely normal, you will receive your results only by: Marland Kitchen MyChart Message (if you have MyChart) OR . A paper copy in the mail If you have any lab test that is abnormal or we need to change your treatment, we will call you to review the results.   Testing/Procedures:  Your physician has requested that you have an echocardiogram. Echocardiography is a painless test that uses sound waves to create images of your heart. It provides your doctor with information about the size and shape of your heart and how well your heart's chambers and valves are working. This procedure takes approximately one hour. There are no restrictions for this procedure.[place] are scheduled for a Cardioversion on Wednesday 12/20/19  with Dr. Marlou Porch.  Please arrive at the [facility] (Main Entrance A) at [facility] [address] [place], [place] [address] at 6:30 AM. (1 hour prior to procedure unless lab work is needed; if lab work is needed arrive 1.5 hours ahead)  DIET: Nothing to eat or drink after midnight except a sip of water with medications (see medication instructions below)  Medication Instructions: DO NOT TAKE METOPROLOL TOMORROW MORNING  Continue your anticoagulant: ELIQUIS   GO TO [place] @ 12:45 PM [place] must have a responsible person to drive you home and stay in the waiting area during your procedure. Failure to do so could result in cancellation.  Bring your insurance cards.  *Special Note: Every effort is made to have your procedure done on time. Occasionally there are emergencies that occur at the hospital that may cause delays. Please be patient if a delay does occur.      Follow-Up: At [facility], you  and your health needs are our priority.  As part of our continuing mission to provide you with exceptional heart care, we have created designated Provider Care Teams.  These Care Teams include your primary Cardiologist (physician) and Advanced Practice Providers (APPs -  Physician Assistants and Nurse Practitioners) who all work together to provide you with the care you need, when you need it.  We recommend signing up for the patient portal called "MyChart".  Sign up information is provided on this After Visit Summary.  MyChart is used to connect with patients for Virtual Visits (Telemedicine).  Patients are able to view lab/test results, encounter notes, upcoming appointments, etc.  Non-urgent messages can be sent to your provider as well.   To learn more about what you can do with MyChart, go to [email or web address].    Your next appointment:   3 month(s)  The format for your next appointment:   In Person  Provider:   Kirk Ruths, MD

## 2019-12-20 ENCOUNTER — Ambulatory Visit (HOSPITAL_COMMUNITY): Payer: Medicare Other | Admitting: Anesthesiology

## 2019-12-20 ENCOUNTER — Ambulatory Visit (HOSPITAL_COMMUNITY)
Admission: RE | Admit: 2019-12-20 | Discharge: 2019-12-20 | Disposition: A | Discharge status: Home / Self Care | Payer: Medicare Other | Attending: Cardiology | Admitting: Cardiology

## 2019-12-20 ENCOUNTER — Other Ambulatory Visit: Payer: Self-pay

## 2019-12-20 ENCOUNTER — Encounter (HOSPITAL_COMMUNITY)
Admission: RE | Disposition: A | Discharge status: Home / Self Care | Payer: Medicare Other | Source: Home / Self Care | Attending: Cardiology

## 2019-12-20 DIAGNOSIS — R002 Palpitations: Secondary | ICD-10-CM | POA: Insufficient documentation

## 2019-12-20 DIAGNOSIS — R0600 Dyspnea, unspecified: Secondary | ICD-10-CM | POA: Diagnosis not present

## 2019-12-20 DIAGNOSIS — Z87891 Personal history of nicotine dependence: Secondary | ICD-10-CM | POA: Insufficient documentation

## 2019-12-20 DIAGNOSIS — G4733 Obstructive sleep apnea (adult) (pediatric): Secondary | ICD-10-CM | POA: Insufficient documentation

## 2019-12-20 DIAGNOSIS — Z7901 Long term (current) use of anticoagulants: Secondary | ICD-10-CM | POA: Diagnosis not present

## 2019-12-20 DIAGNOSIS — K219 Gastro-esophageal reflux disease without esophagitis: Secondary | ICD-10-CM | POA: Insufficient documentation

## 2019-12-20 DIAGNOSIS — I4892 Unspecified atrial flutter: Secondary | ICD-10-CM | POA: Diagnosis not present

## 2019-12-20 DIAGNOSIS — G629 Polyneuropathy, unspecified: Secondary | ICD-10-CM | POA: Insufficient documentation

## 2019-12-20 DIAGNOSIS — Z8249 Family history of ischemic heart disease and other diseases of the circulatory system: Secondary | ICD-10-CM | POA: Diagnosis not present

## 2019-12-20 DIAGNOSIS — I341 Nonrheumatic mitral (valve) prolapse: Secondary | ICD-10-CM | POA: Diagnosis not present

## 2019-12-20 DIAGNOSIS — I081 Rheumatic disorders of both mitral and tricuspid valves: Secondary | ICD-10-CM | POA: Diagnosis not present

## 2019-12-20 DIAGNOSIS — I480 Paroxysmal atrial fibrillation: Secondary | ICD-10-CM | POA: Diagnosis not present

## 2019-12-20 DIAGNOSIS — Z79899 Other long term (current) drug therapy: Secondary | ICD-10-CM | POA: Diagnosis not present

## 2019-12-20 DIAGNOSIS — E7800 Pure hypercholesterolemia, unspecified: Secondary | ICD-10-CM | POA: Diagnosis not present

## 2019-12-20 HISTORY — PX: CARDIOVERSION: SHX1299

## 2019-12-20 LAB — POCT I-STAT, CHEM 8
BUN: 12 mg/dL (ref 8–23)
Calcium, Ion: 1.26 mmol/L (ref 1.15–1.40)
Chloride: 105 mmol/L (ref 98–111)
Creatinine, Ser: 1 mg/dL (ref 0.61–1.24)
Glucose, Bld: 99 mg/dL (ref 70–99)
HCT: 41 % (ref 39.0–52.0)
Hemoglobin: 13.9 g/dL (ref 13.0–17.0)
Potassium: 4.1 mmol/L (ref 3.5–5.1)
Sodium: 140 mmol/L (ref 135–145)
TCO2: 29 mmol/L (ref 22–32)

## 2019-12-20 SURGERY — CARDIOVERSION
Anesthesia: General

## 2019-12-20 MED ORDER — SODIUM CHLORIDE 0.9 % IV SOLN: INTRAVENOUS | Status: DC

## 2019-12-20 MED ORDER — LIDOCAINE 2% (20 MG/ML) 5 ML SYRINGE
INTRAMUSCULAR | Status: DC | PRN
  Administered 2019-12-20: 30 mg via INTRAVENOUS

## 2019-12-20 MED ORDER — PROPOFOL 10 MG/ML IV BOLUS
INTRAVENOUS | Status: DC | PRN
  Administered 2019-12-20: 80 mg via INTRAVENOUS

## 2019-12-20 NOTE — Discharge Instructions (Signed)
Electrical Cardioversion Electrical cardioversion is the delivery of a jolt of electricity to restore a normal rhythm to the heart. A rhythm that is too fast or is not regular keeps the heart from pumping well. In this procedure, sticky patches or metal paddles are placed on the chest to deliver electricity to the heart from a device. This procedure may be done in an emergency if:  There is low or no blood pressure as a result of the heart rhythm.  Normal rhythm must be restored as fast as possible to protect the brain and heart from further damage.  It may save a life. This may also be a scheduled procedure for irregular or fast heart rhythms that are not immediately life-threatening. Tell a health care provider about:  Any allergies you have.  All medicines you are taking, including vitamins, herbs, eye drops, creams, and over-the-counter medicines.  Any problems you or family members have had with anesthetic medicines.  Any blood disorders you have.  Any surgeries you have had.  Any medical conditions you have.  Whether you are pregnant or may be pregnant. What are the risks? Generally, this is a safe procedure. However, problems may occur, including:  Allergic reactions to medicines.  A blood clot that breaks free and travels to other parts of your body.  The possible return of an abnormal heart rhythm within hours or days after the procedure.  Your heart stopping (cardiac arrest). This is rare. What happens before the procedure? Medicines  Your health care provider may have you start taking: ? Blood-thinning medicines (anticoagulants) so your blood does not clot as easily. ? Medicines to help stabilize your heart rate and rhythm.  Ask your health care provider about: ? Changing or stopping your regular medicines. This is especially important if you are taking diabetes medicines or blood thinners. ? Taking medicines such as aspirin and ibuprofen. These medicines can  thin your blood. Do not take these medicines unless your health care provider tells you to take them. ? Taking over-the-counter medicines, vitamins, herbs, and supplements. General instructions  Follow instructions from your health care provider about eating or drinking restrictions.  Plan to have someone take you home from the hospital or clinic.  If you will be going home right after the procedure, plan to have someone with you for 24 hours.  Ask your health care provider what steps will be taken to help prevent infection. These may include washing your skin with a germ-killing soap. What happens during the procedure?   An IV will be inserted into one of your veins.  Sticky patches (electrodes) or metal paddles may be placed on your chest.  You will be given a medicine to help you relax (sedative).  An electrical shock will be delivered. The procedure may vary among health care providers and hospitals. What can I expect after the procedure?  Your blood pressure, heart rate, breathing rate, and blood oxygen level will be monitored until you leave the hospital or clinic.  Your heart rhythm will be watched to make sure it does not change.  You may have some redness on the skin where the shocks were given. Follow these instructions at home:  Do not drive for 24 hours if you were given a sedative during your procedure.  Take over-the-counter and prescription medicines only as told by your health care provider.  Ask your health care provider how to check your pulse. Check it often.  Rest for 48 hours after the procedure or   as told by your health care provider.  Avoid or limit your caffeine use as told by your health care provider.  Keep all follow-up visits as told by your health care provider. This is important. Contact a health care provider if:  You feel like your heart is beating too quickly or your pulse is not regular.  You have a serious muscle cramp that does not go  away. Get help right away if:  You have discomfort in your chest.  You are dizzy or you feel faint.  You have trouble breathing or you are short of breath.  Your speech is slurred.  You have trouble moving an arm or leg on one side of your body.  Your fingers or toes turn cold or blue. Summary  Electrical cardioversion is the delivery of a jolt of electricity to restore a normal rhythm to the heart.  This procedure may be done right away in an emergency or may be a scheduled procedure if the condition is not an emergency.  Generally, this is a safe procedure.  After the procedure, check your pulse often as told by your health care provider. This information is not intended to replace advice given to you by your health care provider. Make sure you discuss any questions you have with your health care provider. Document Revised: 02/13/2019 Document Reviewed: 02/13/2019 Elsevier Patient Education  2020 Elsevier Inc.  

## 2019-12-20 NOTE — Transfer of Care (Signed)
Immediate Anesthesia Transfer of Care Note  Patient: Wesley [name].  Procedure(s) Performed: CARDIOVERSION (N/A )  Patient Location: Endoscopy Unit  Anesthesia Type:General  Level of Consciousness: awake, alert , oriented, drowsy and patient cooperative  Airway & Oxygen Therapy: Patient Spontanous Breathing and Patient connected to nasal cannula oxygen  Post-op Assessment: Report given to RN and Post -op Vital signs reviewed and stable  Post vital signs: Reviewed and stable  Last Vitals:  Vitals Value Taken Time  BP 111/83   Temp 98   Pulse 65   Resp    SpO2 99     Last Pain:  Vitals:   12/20/19 0704  TempSrc: Oral  PainSc: 0-No pain         Complications: No apparent anesthesia complications

## 2019-12-20 NOTE — Interval H&P Note (Signed)
History and Physical Interval Note:  12/20/2019 7:38 AM  Wesley [name].  has presented today for surgery, with the diagnosis of afib.  The various methods of treatment have been discussed with the patient and family. After consideration of risks, benefits and other options for treatment, the patient has consented to  Procedure(s): CARDIOVERSION (N/A) as a surgical intervention.  The patient's history has been reviewed, patient examined, no change in status, stable for surgery.  I have reviewed the patient's chart and labs.  Questions were answered to the patient's satisfaction.     [employer]

## 2019-12-20 NOTE — CV Procedure (Signed)
    Electrical Cardioversion Procedure Note Ediberto Farrell LX:[id number] 07-May-1941  Procedure: Electrical Cardioversion Indications:  Atrial Fibrillation  Time Out: Verified patient identification, verified procedure,medications/allergies/relevent history reviewed, required imaging and test results available.  Performed  Procedure Details  The patient was NPO after midnight. Anesthesia was administered at the beside  by Dr.Green with 80mg  of propofol.  Cardioversion was performed with synchronized biphasic defibrillation via AP pads with 200 joules.  1 attempt(s) were performed.  The patient converted to normal sinus rhythm. The patient tolerated the procedure well   IMPRESSION:  Successful cardioversion of atrial fibrillation    Candee Furbish 12/20/2019, 8:01 AM

## 2019-12-20 NOTE — Anesthesia Postprocedure Evaluation (Signed)
Anesthesia Post Note  Patient: Wesley [name].  Procedure(s) Performed: CARDIOVERSION (N/A )     Patient location during evaluation: Endoscopy Anesthesia Type: General Level of consciousness: awake Pain management: pain level controlled Vital Signs Assessment: post-procedure vital signs reviewed and stable Respiratory status: spontaneous breathing Cardiovascular status: stable Postop Assessment: no apparent nausea or vomiting Anesthetic complications: no    Last Vitals:  Vitals:   12/20/19 0810 12/20/19 0815  BP: 94/72 96/74  Pulse: 63 61  Resp: 13 12  Temp:    SpO2: 96% 96%    Last Pain:  Vitals:   12/20/19 0753  TempSrc: Oral  PainSc: 0-No pain                 Dama Hedgepeth

## 2019-12-21 ENCOUNTER — Other Ambulatory Visit: Payer: Self-pay | Admitting: Cardiology

## 2019-12-21 DIAGNOSIS — I4891 Unspecified atrial fibrillation: Secondary | ICD-10-CM

## 2019-12-26 DIAGNOSIS — I471 Supraventricular tachycardia: Secondary | ICD-10-CM | POA: Diagnosis not present

## 2019-12-26 DIAGNOSIS — Z7901 Long term (current) use of anticoagulants: Secondary | ICD-10-CM | POA: Diagnosis not present

## 2019-12-26 DIAGNOSIS — Z8679 Personal history of other diseases of the circulatory system: Secondary | ICD-10-CM | POA: Diagnosis not present

## 2019-12-26 DIAGNOSIS — Z5181 Encounter for therapeutic drug level monitoring: Secondary | ICD-10-CM | POA: Diagnosis not present

## 2019-12-26 DIAGNOSIS — I4819 Other persistent atrial fibrillation: Secondary | ICD-10-CM | POA: Diagnosis not present

## 2019-12-26 DIAGNOSIS — Z9889 Other specified postprocedural states: Secondary | ICD-10-CM | POA: Diagnosis not present

## 2019-12-26 DIAGNOSIS — Z79899 Other long term (current) drug therapy: Secondary | ICD-10-CM | POA: Diagnosis not present

## 2020-01-03 ENCOUNTER — Other Ambulatory Visit: Payer: Self-pay | Admitting: Cardiology

## 2020-01-03 DIAGNOSIS — I4891 Unspecified atrial fibrillation: Secondary | ICD-10-CM

## 2020-01-03 MED ORDER — METOPROLOL SUCCINATE ER 25 MG PO TB24: 25.0000 mg | ORAL_TABLET | Freq: Every day | ORAL | 3 refills | Status: DC

## 2020-01-03 NOTE — Telephone Encounter (Signed)
New Message     *STAT* If patient is at the pharmacy, call can be transferred to refill team.   1. Which medications need to be refilled? (please list name of each medication and dose if known) metoprolol succinate (TOPROL XL) 25 MG 24 hr tablet  2. Which pharmacy/location (including street and city if local pharmacy) is medication to be sent to? [facility] [place], [place]  3. Do they need a 30 day or 90 day supply? [place]

## 2020-01-08 DIAGNOSIS — I480 Paroxysmal atrial fibrillation: Secondary | ICD-10-CM | POA: Diagnosis not present

## 2020-01-08 DIAGNOSIS — M150 Primary generalized (osteo)arthritis: Secondary | ICD-10-CM | POA: Diagnosis not present

## 2020-01-08 DIAGNOSIS — E663 Overweight: Secondary | ICD-10-CM | POA: Diagnosis not present

## 2020-01-08 DIAGNOSIS — M5136 Other intervertebral disc degeneration, lumbar region: Secondary | ICD-10-CM | POA: Diagnosis not present

## 2020-01-08 DIAGNOSIS — M450 Ankylosing spondylitis of multiple sites in spine: Secondary | ICD-10-CM | POA: Diagnosis not present

## 2020-01-08 DIAGNOSIS — Z6827 Body mass index (BMI) 27.0-27.9, adult: Secondary | ICD-10-CM | POA: Diagnosis not present

## 2020-01-09 DIAGNOSIS — H353210 Exudative age-related macular degeneration, right eye, stage unspecified: Secondary | ICD-10-CM | POA: Diagnosis not present

## 2020-01-09 DIAGNOSIS — H353211 Exudative age-related macular degeneration, right eye, with active choroidal neovascularization: Secondary | ICD-10-CM | POA: Diagnosis not present

## 2020-01-11 ENCOUNTER — Other Ambulatory Visit: Payer: Self-pay

## 2020-01-11 ENCOUNTER — Ambulatory Visit (HOSPITAL_COMMUNITY): Payer: Medicare Other | Attending: Cardiovascular Disease

## 2020-01-11 DIAGNOSIS — I480 Paroxysmal atrial fibrillation: Secondary | ICD-10-CM | POA: Diagnosis not present

## 2020-01-21 IMAGING — DX DG CHEST 2V
2 series · 2 of 2 positions shown · non-contrast
Comparison: Radiographs May 20, 2009.

CLINICAL DATA: Tachycardia.

EXAM:
CHEST - 2 VIEW

[chest pa]
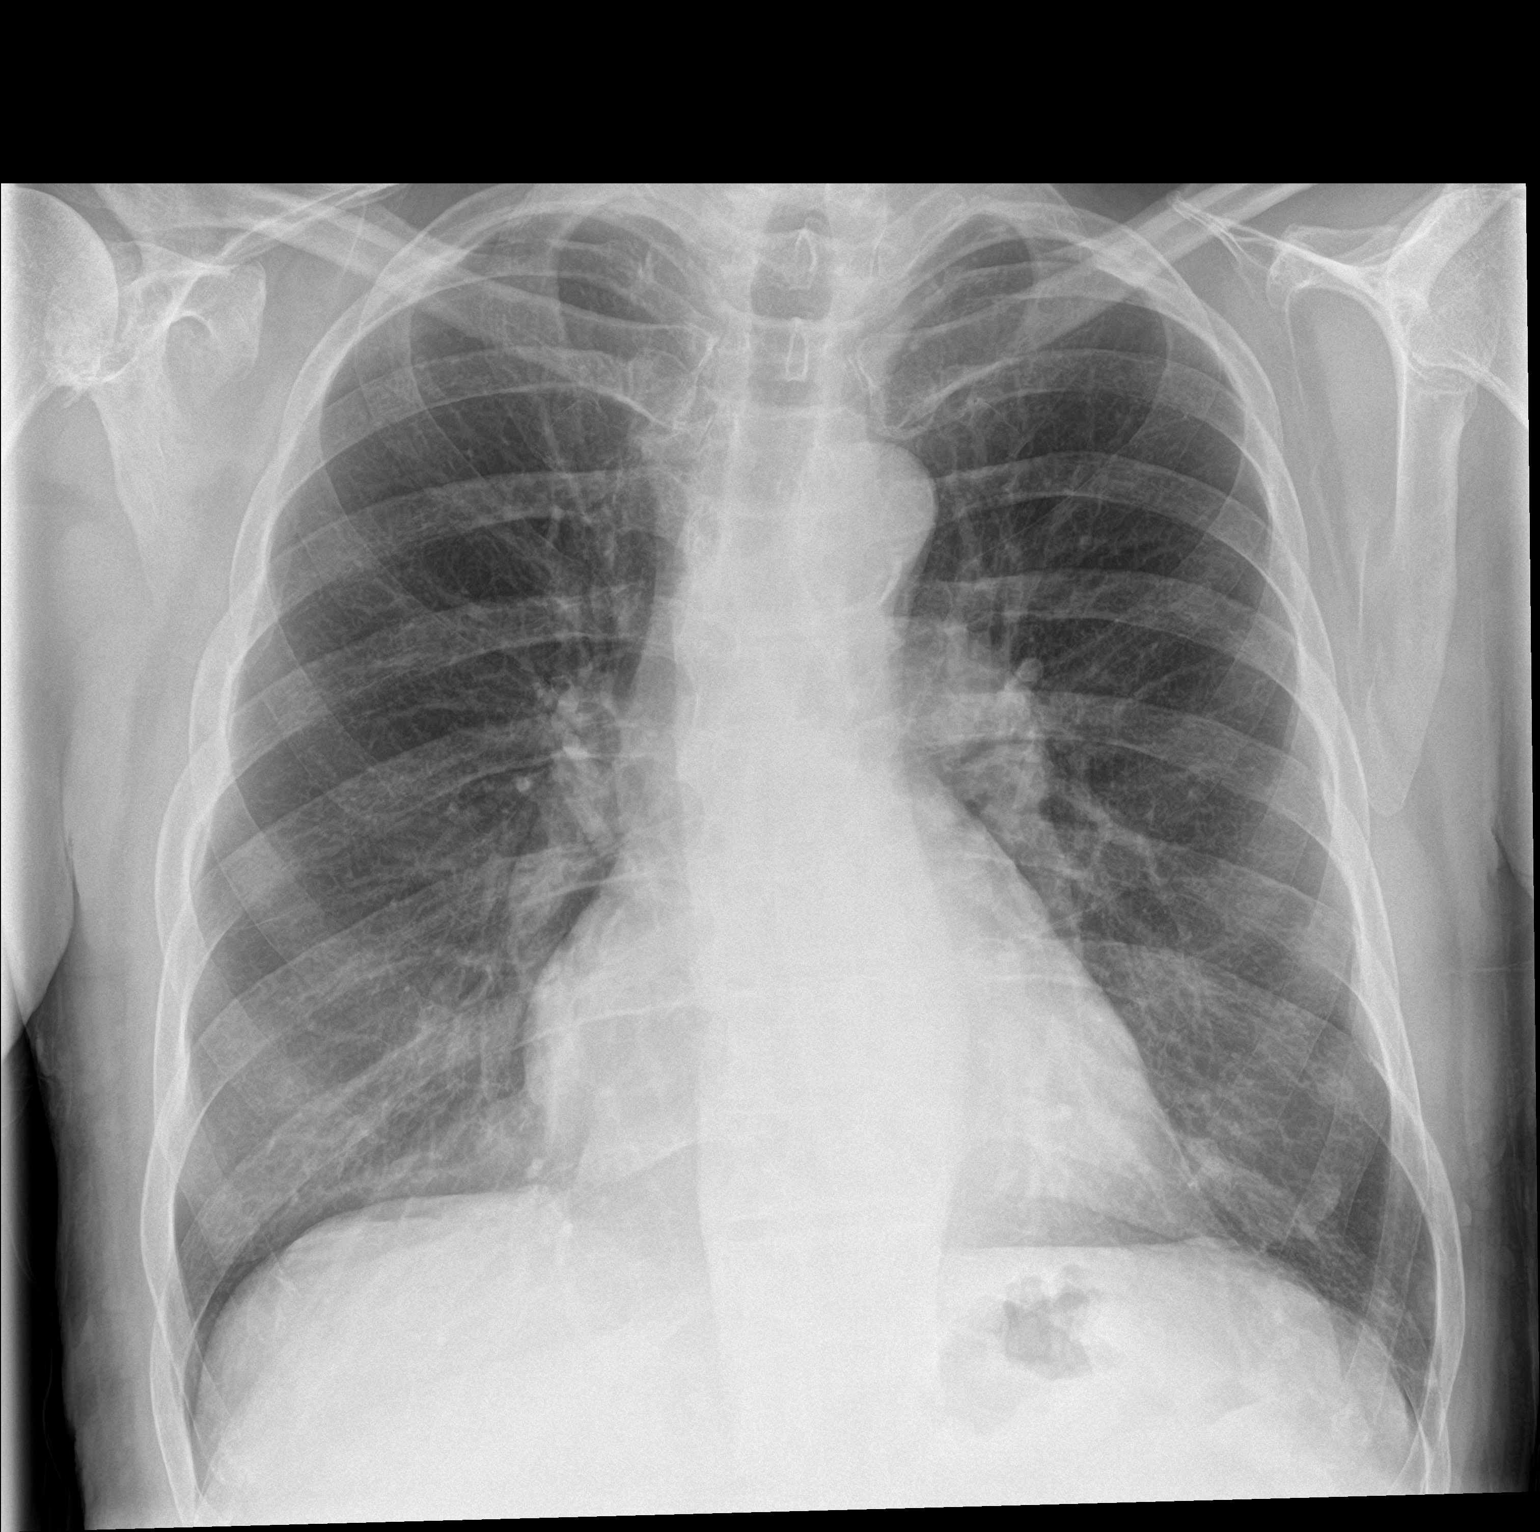

[chest lat]
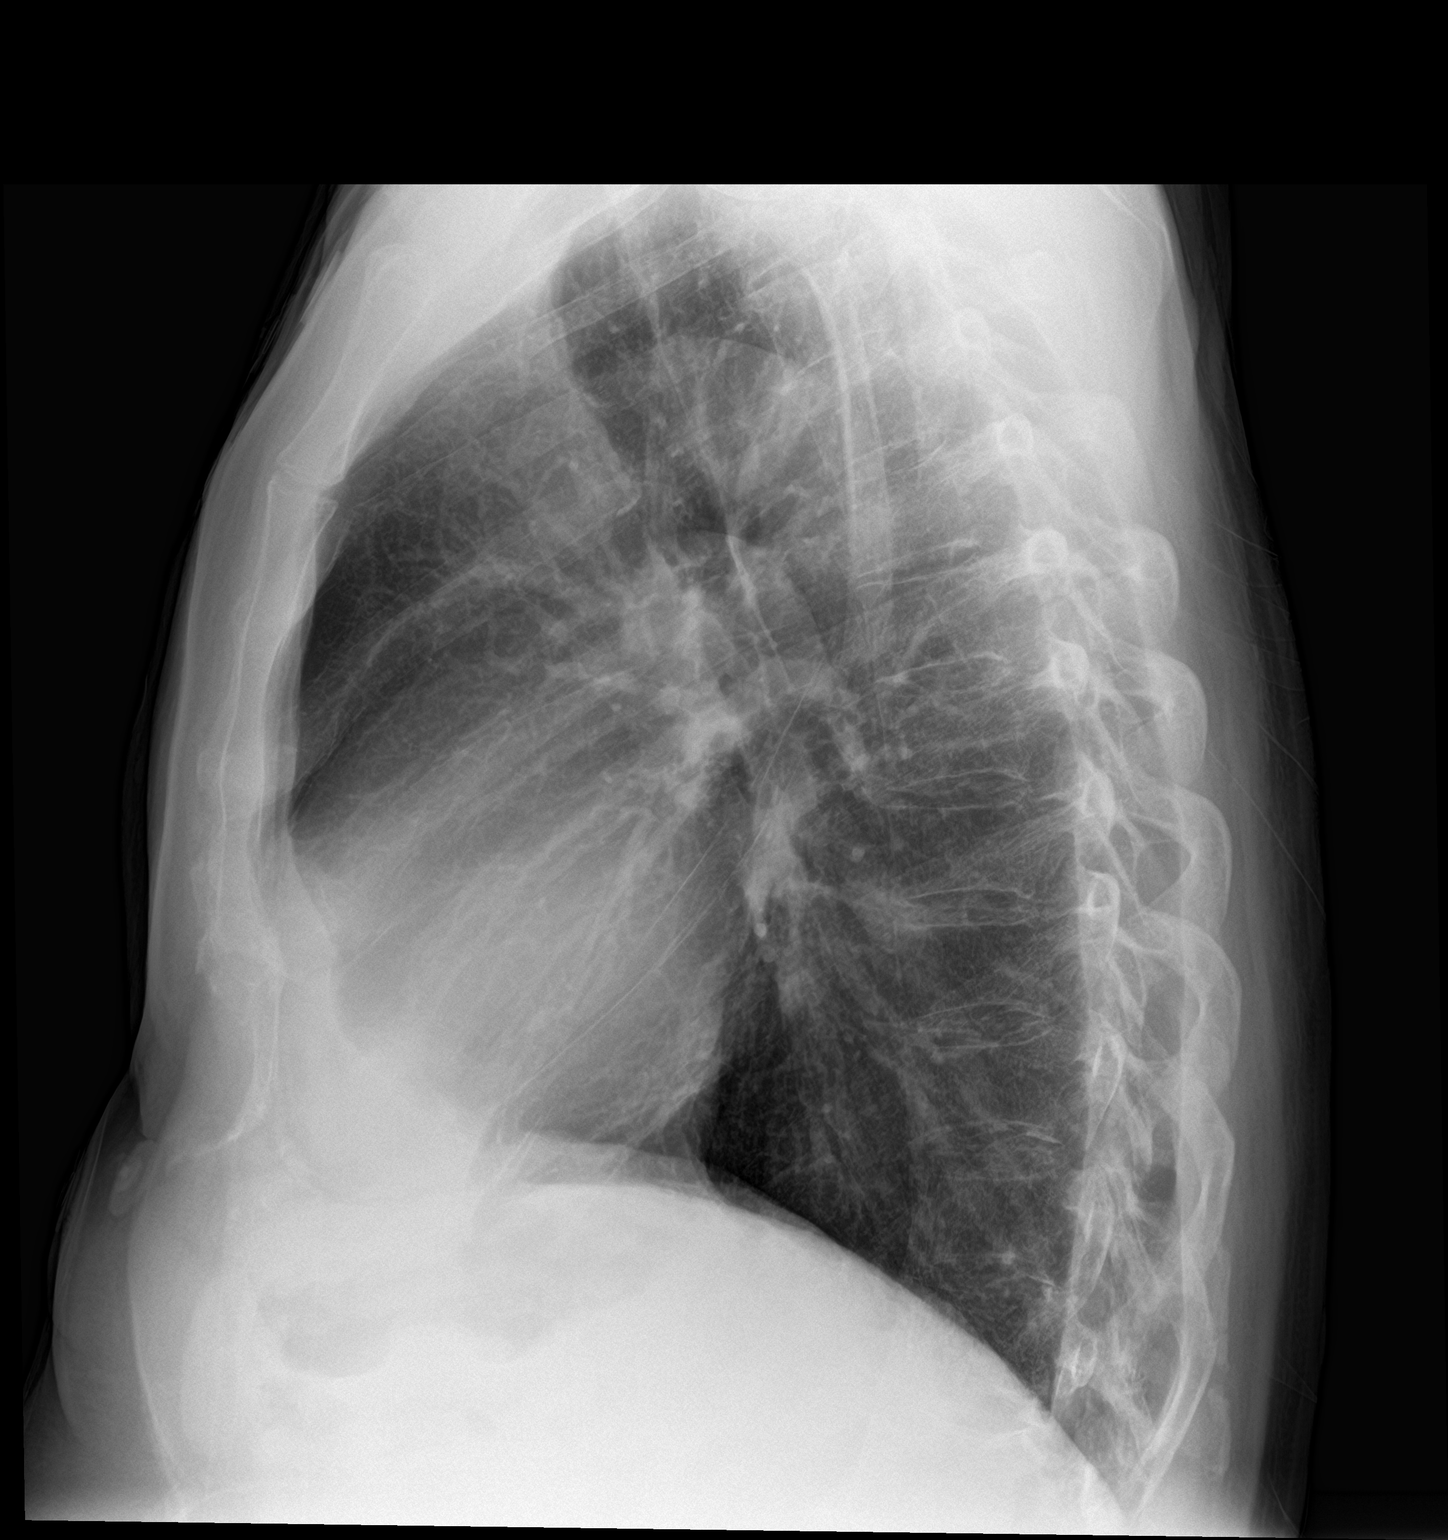

[2 of 2 positions shown; findings below may reference images not displayed]

FINDINGS: Stable cardiomediastinal silhouette. No pneumothorax or pleural
effusion is noted. Both lungs are clear. The visualized skeletal
structures are unremarkable.
IMPRESSION: No active cardiopulmonary disease.

## 2020-01-30 DIAGNOSIS — H43813 Vitreous degeneration, bilateral: Secondary | ICD-10-CM | POA: Diagnosis not present

## 2020-01-30 DIAGNOSIS — H353122 Nonexudative age-related macular degeneration, left eye, intermediate dry stage: Secondary | ICD-10-CM | POA: Diagnosis not present

## 2020-01-30 DIAGNOSIS — H353211 Exudative age-related macular degeneration, right eye, with active choroidal neovascularization: Secondary | ICD-10-CM | POA: Diagnosis not present

## 2020-01-30 DIAGNOSIS — H2513 Age-related nuclear cataract, bilateral: Secondary | ICD-10-CM | POA: Diagnosis not present

## 2020-01-31 NOTE — Progress Notes (Signed)
@Patient  ID: Wesley Chris., male    DOB: 12-27-1940, 79 y.o.   MRN: [id number]  Chief Complaint  Patient presents with  . Follow-up    Referring provider: Shon Baton, MD  HPI: 79 year old male, former smoker. PMH significant for OSA, afib (Eliquis), peripheral neuropathy, ankylosing spondylitis. Patient of Dr. Annamaria Boots, last seen on HST-07/06/2018-AHI 17.5/hour, desaturation to 82%, body weight 216 pound9/11/20. Maintained on CPAP, DME company is [employer].  02/01/2020 Patient presents today for CPAP compliance.He was last seen in September 2020 and recommended to follow-up in 4 months. He has been 100% compliant with CPAP use. He has been on therapy for 11 years. He was originally dx d/t afib. Sleep test was ordered by cardiologist prior to cardiac ablation. He had a recent cardioversion last week at [facility]. He is getting ready for another ablation on July 14th with Dr. Rolland Porter for persistent afib, tachycardia/aflutter. He is on 25mg  Toprol XL and Eliquis 5mg  twice daily.   CPAP is currently set at auto titrate 8-15cm h20. His only concern is events. Denies issues with pressure setting and full face mask fit. His only concern is the events. He is sleeping well, no daytime fatigue. Normal bed time 10:30/11pm, wakes up 6:30am. Wakes up 1-2 times at night to use the restroom .   Airview download 01/02/20-01/31/20: 30/30 days used; 100% > 4 hours Average usage 7 hours 32 mins Pressure auto 8-15cm h20 ( 12.7cm h20- 95%) Events - AI 2.6/ HI 4.9 / AHI 7.5 Central apnea 2.2/ Obstructive 0.4  No Known Allergies  Immunization History  Administered Date(s) Administered  . Influenza Split 06/05/2011  . Influenza Whole 04/17/2010  . Influenza, High Dose Seasonal PF 04/26/2018, 03/16/2019  . Influenza-Unspecified 05/08/2009, 05/23/2015  . PFIZER SARS-COV-2 Vaccination 08/05/2019, 08/26/2019  . Zoster Recombinat (Shingrix) 09/09/2017    Past Medical History:  Diagnosis Date  . A-fib [facility])     cardioversion x2  . Ankylosing spondylitis ([place])   . Atrial flutter ([place])   . GERD (gastroesophageal reflux disease)   . Hemorrhoids   . Irregular heart beat   . MVP (mitral valve prolapse)    WITH A MIDSYSTOLIC CLICK  . OSA (obstructive sleep apnea)    moderate OSA NPSG 10/24/09 - AHO 21.8/hr  . Peripheral neuropathy   . Vitamin B12 deficiency     Tobacco History: Social History   Tobacco Use  Smoking Status Former Smoker  . Packs/day: 0.00  . Types: Cigarettes  . Quit date: 07/27/1969  . Years since quitting: 50.5  Smokeless Tobacco Never Used  Tobacco Comment   less than 1 pack/day   Counseling given: Not Answered Comment: less than 1 pack/day   Outpatient Medications Prior to Visit  Medication Sig Dispense Refill  . acidophilus (RISAQUAD) CAPS capsule Take 1 capsule by mouth daily.    . Alpha-Lipoic Acid 600 MG CAPS Take 600 mg by mouth daily.    . Ascorbic Acid (VITAMIN C) 1000 MG tablet Take 1,000 mg by mouth daily.      Marland Kitchen b complex vitamins capsule Take 1 capsule by mouth daily.      . Cyanocobalamin 5000 MCG SUBL Place 1 tablet under the tongue daily.     Marland Kitchen dronedarone (MULTAQ) 400 MG tablet Take 400 mg by mouth 2 (two) times daily with a meal.    . ELIQUIS 5 MG TABS tablet TAKE 1 TABLET TWICE A DAY (Patient taking differently: Take 5 mg by mouth 2 (two) times daily. )  180 tablet 3  . gabapentin (NEURONTIN) 300 MG capsule Take 1 capsule (300 mg total) by mouth 4 (four) times daily. 360 capsule 2  . Loperamide-Simethicone 2-125 MG TABS Take 1 tablet by mouth as needed.     . meloxicam (MOBIC) 15 MG tablet Take 15 mg by mouth daily as needed for pain.    . metoprolol succinate (TOPROL XL) 25 MG 24 hr tablet Take 1 tablet (25 mg total) by mouth daily. 90 tablet 3  . Multiple Vitamin (MULTIVITAMIN) tablet Take 1 tablet by mouth daily.      . Multiple Vitamins-Minerals (PRESERVISION AREDS 2+MULTI VIT) CAPS Take 1 tablet by mouth daily.     Marland Kitchen NEXIUM 40 MG capsule TAKE 1  CAPSULE DAILY (Patient taking differently: Take 40 mg by mouth daily. ) 90 capsule 3  . NON FORMULARY Take 2 capsules by mouth daily. MITOQ    . pyridOXINE (VITAMIN B-6) 50 MG tablet Take 50 mg by mouth 2 (two) times daily.    . rosuvastatin (CRESTOR) 5 MG tablet Take 5 mg by mouth daily. Taking 1/2 tab every other day     No facility-administered medications prior to visit.    Review of Systems  Review of Systems  Constitutional: Negative.   Respiratory: Negative.   Psychiatric/Behavioral: Negative for sleep disturbance.   Physical Exam  BP 118/64 (BP Location: Left Arm, Cuff Size: Normal)   Pulse 62   Temp 98.1 F (36.7 C)   Ht 6\' 3"  (1.905 m)   Wt 216 lb 12.8 oz (98.3 kg)   SpO2 96%   BMI 27.10 kg/m  Physical Exam Constitutional:      Appearance: Normal appearance.  HENT:     Head: Normocephalic and atraumatic.     Mouth/Throat:     Mouth: Mucous membranes are moist.     Pharynx: Oropharynx is clear.     Comments: Mallampati class I Cardiovascular:     Rate and Rhythm: Normal rate. Rhythm irregular.  Pulmonary:     Effort: Pulmonary effort is normal.     Breath sounds: Normal breath sounds.  Neurological:     General: No focal deficit present.     Mental Status: He is alert and oriented to person, place, and time. Mental status is at baseline.  Psychiatric:        Mood and Affect: Mood normal.        Behavior: Behavior normal.        Thought Content: Thought content normal.        Judgment: Judgment normal.      Lab Results:  CBC    Component Value Date/Time   WBC 6.4 05/03/2019 0852   RBC 4.61 05/03/2019 0852   HGB 13.9 12/20/2019 0726   HCT 41.0 12/20/2019 0726   PLT 278 05/03/2019 0852   MCV 92.6 05/03/2019 0852   MCH 31.5 05/03/2019 0852   MCHC 34.0 05/03/2019 0852   RDW 13.1 05/03/2019 0852    BMET    Component Value Date/Time   NA 140 12/20/2019 0726   K 4.1 12/20/2019 0726   CL 105 12/20/2019 0726   CO2 26 05/03/2019 0852   GLUCOSE  99 12/20/2019 0726   BUN 12 12/20/2019 0726   CREATININE 1.00 12/20/2019 0726   CALCIUM 9.2 05/03/2019 0852   GFRNONAA >60 05/03/2019 0852   GFRAA >60 05/03/2019 0852    BNP No results found for: BNP  ProBNP No results found for: PROBNP  Imaging: ECHOCARDIOGRAM COMPLETE  Result  Date: 01/11/2020    ECHOCARDIOGRAM REPORT   Patient Name:   Wesley R Hilton Saephan. Date of Exam: 01/11/2020 Medical Rec #:  [id number]         Height:       75.0 in Accession #:    [id number]        Weight:       212.0 lb Date of Birth:  1940-08-17         BSA:          2.249 m Patient Age:    62 years          BP:           126/80 mmHg Patient Gender: M                 HR:           56 bpm. Exam Location:  [place] Procedure: 2D Echo, Cardiac Doppler and Color Doppler Indications:    I48.91 Atrial Fibrillation  History:        Patient has prior history of Echocardiogram examinations, most                 recent 10/26/2017. Ablation, Mitral Valve Prolapse,                 Arrythmias:Atrial Flutter; Risk Factors:Sleep Apnea.  Sonographer:    Marygrace Drought RCS Referring Phys: [place]  1. Left ventricular ejection fraction, by estimation, is 55 to 60%. The left ventricle has normal function. The left ventricle has no regional wall motion abnormalities. Left ventricular diastolic parameters are consistent with Grade II diastolic dysfunction (pseudonormalization).  2. Right ventricular systolic function is normal. The right ventricular size is mildly enlarged. There is normal pulmonary artery systolic pressure. The estimated right ventricular systolic pressure is 32.4 mmHg.  3. The mitral valve is myxomatous. Trivial mitral valve regurgitation. No evidence of mitral stenosis.  4. 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  5. The aortic valve is tricuspid. Aortic valve regurgitation is not visualized. Mild aortic valve sclerosis is present, with no evidence of aortic  valve stenosis.  6. The inferior vena cava is normal in size with greater than 50% respiratory variability, suggesting right atrial pressure of 3 mmHg. Comparison(s): No significant change from prior study. EF unchanged. MR now mild. TR remains moderate. FINDINGS  Left Ventricle: Left ventricular ejection fraction, by estimation, is 55 to 60%. The left ventricle has normal function. The left ventricle has no regional wall motion abnormalities. The left ventricular internal cavity size was normal in size. There is  no left ventricular hypertrophy. Left ventricular diastolic parameters are consistent with Grade II diastolic dysfunction (pseudonormalization). Right Ventricle: The right ventricular size is mildly enlarged. No increase in right ventricular wall thickness. Right ventricular systolic function is normal. There is normal pulmonary artery systolic pressure. The tricuspid regurgitant velocity is 2.78  m/s, and with an assumed right atrial pressure of 3 mmHg, the estimated right ventricular systolic pressure is 40.1 mmHg. Left Atrium: Left atrial size was normal in size. Right Atrium: Right atrial size was normal in size. Pericardium: There is no evidence of pericardial effusion. 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 Trivial mitral valve regurgitation. No evidence of mitral valve stenosis. Tricuspid Valve: 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 No evidence of tricuspid stenosis.  Aortic Valve: The aortic valve is tricuspid. . There is mild thickening and mild calcification of the aortic valve. Aortic valve regurgitation is not visualized. Mild aortic valve sclerosis is present, with no evidence of aortic valve stenosis. There is mild thickening of the aortic valve. There is mild calcification of the aortic valve. Pulmonic Valve: The pulmonic valve was grossly normal. Pulmonic valve  regurgitation is trivial. No evidence of pulmonic stenosis. Aorta: The aortic root and ascending aorta are structurally normal, with no evidence of dilitation. Venous: The inferior vena cava is normal in size with greater than 50% respiratory variability, suggesting right atrial pressure of 3 mmHg. IAS/Shunts: The atrial septum is grossly normal.  LEFT VENTRICLE PLAX 2D LVIDd:         5.42 cm  Diastology LVIDs:         3.21 cm  LV e' lateral:   7.29 cm/s LV PW:         0.84 cm  LV E/e' lateral: 12.8 LV IVS:        0.71 cm  LV e' medial:    8.05 cm/s LVOT diam:     2.30 cm  LV E/e' medial:  11.6 LV SV:         63 LV SV Index:   28 LVOT Area:     4.15 cm  RIGHT VENTRICLE RV Basal diam:  4.44 cm RVSP:           33.9 mmHg LEFT ATRIUM             Index       RIGHT ATRIUM LA diam:        3.90 cm 1.73 cm/m  RA Pressure: 3.00 mmHg LA Vol (A2C):   66.4 ml 29.52 ml/m LA Vol (A4C):   52.5 ml 23.34 ml/m LA Biplane Vol: 61.0 ml 27.12 ml/m  AORTIC VALVE LVOT Vmax:   67.90 cm/s LVOT Vmean:  49.600 cm/s LVOT VTI:    0.152 m  AORTA Ao Root diam: 3.80 cm Ao Asc diam:  3.60 cm MITRAL VALVE               TRICUSPID VALVE MV Area (PHT):             TR Peak grad:   30.9 mmHg MV Decel Time:             TR Vmax:        278.00 cm/s MV E velocity: 93.40 cm/s  Estimated RAP:  3.00 mmHg MV A velocity: 45.80 cm/s  RVSP:           33.9 mmHg MV E/A ratio:  2.04                            SHUNTS                            Systemic VTI:  0.15 m                            Systemic Diam: 2.30 cm Eleonore Chiquito MD Electronically signed by Eleonore Chiquito MD Signature Date/Time: 01/11/2020/11:46:17 AM    Final      Assessment & Plan:   OBSTRUCTIVE SLEEP APNEA - Patient has had percent compliant with CPAP and reports benefit from use - Pressure auto 8 -15 cm H2O (12.7 cm H2O-95%);  AHI 7.5  -  Apnea index: Central apnea 2.2/obstructive 0.4 - Discussed with Dr. Annamaria Boots and he felt most of his events were central and may improve after cardiac  ablation due to perfusion - No changes to pressure settings today - Continue to wear CPAP for 4 to 6 hours or more each night, advised patient not to drive if experiencing excessive daytime fatigue or somnolence - Recommend follow-up in 4 months with Dr. Annamaria Boots  Persistent atrial fibrillation -Patient continues 25 mg Toprol XL and 5 mg Eliquis twice daily -He is scheduled for cardiac ablation next week with Dr. Leavy Cella, NP 02/01/2020

## 2020-02-01 ENCOUNTER — Encounter: Payer: Self-pay | Admitting: Primary Care

## 2020-02-01 ENCOUNTER — Ambulatory Visit (INDEPENDENT_AMBULATORY_CARE_PROVIDER_SITE_OTHER): Payer: Medicare Other | Admitting: Primary Care

## 2020-02-01 ENCOUNTER — Other Ambulatory Visit: Payer: Self-pay

## 2020-02-01 VITALS — BP 118/64 | HR 62 | Temp 98.1°F | Ht 75.0 in | Wt 216.8 lb

## 2020-02-01 DIAGNOSIS — G4733 Obstructive sleep apnea (adult) (pediatric): Secondary | ICD-10-CM | POA: Diagnosis not present

## 2020-02-01 DIAGNOSIS — I4819 Other persistent atrial fibrillation: Secondary | ICD-10-CM | POA: Diagnosis not present

## 2020-02-01 NOTE — Assessment & Plan Note (Signed)
-  Patient continues 25 mg Toprol XL and 5 mg Eliquis twice daily -He is scheduled for cardiac ablation next week with Dr. Rolland Porter

## 2020-02-01 NOTE — Assessment & Plan Note (Signed)
-   Patient has had percent compliant with CPAP and reports benefit from use - Pressure auto 8 -15 cm H2O (12.7 cm H2O-95%);  AHI 7.5  - Apnea index: Central apnea 2.2/obstructive 0.4 - Discussed with Dr. Annamaria Boots and he felt most of his events were central and may improve after cardiac ablation due to perfusion - No changes to pressure settings today - Continue to wear CPAP for 4 to 6 hours or more each night, advised patient not to drive if experiencing excessive daytime fatigue or somnolence - Recommend follow-up in 4 months with Dr. Annamaria Boots

## 2020-02-01 NOTE — Patient Instructions (Signed)
Pleasure meeting you today Wesley [name]  Obstructive Sleep apnea: Resmed download showed 100% compliance  No changes to pressure setting today Continue to wear CPAP for minimum 4 to 6 hours every night Do not drive if experiencing daytime fatigue or somnolence  Follow-up: Please reschedule appointment with Dr. Annamaria Boots in September to November 2021   CPAP and BPAP Information CPAP and BPAP are methods of helping a person breathe with the use of air pressure. CPAP stands for "continuous positive airway pressure." BPAP stands for "bi-level positive airway pressure." In both methods, air is blown through your nose or mouth and into your air passages to help you breathe well. CPAP and BPAP use different amounts of pressure to blow air. With CPAP, the amount of pressure stays the same while you breathe in and out. With BPAP, the amount of pressure is increased when you breathe in (inhale) so that you can take larger breaths. Your health care provider will recommend whether CPAP or BPAP would be more helpful for you. Why are CPAP and BPAP treatments used? CPAP or BPAP can be helpful if you have:  Sleep apnea.  Chronic obstructive pulmonary disease (COPD).  Heart failure.  Medical conditions that weaken the muscles of the chest including muscular dystrophy, or neurological diseases such as amyotrophic lateral sclerosis (ALS).  Other problems that cause breathing to be weak, abnormal, or difficult. CPAP is most commonly used for obstructive sleep apnea (OSA) to keep the airways from collapsing when the muscles relax during sleep. How is CPAP or BPAP administered? Both CPAP and BPAP are provided by a small machine with a flexible plastic tube that attaches to a plastic mask. You wear the mask. Air is blown through the mask into your nose or mouth. The amount of pressure that is used to blow the air can be adjusted on the machine. Your health care provider will determine the pressure setting that should  be used based on your individual needs. When should CPAP or BPAP be used? In most cases, the mask only needs to be worn during sleep. Generally, the mask needs to be worn throughout the night and during any daytime naps. People with certain medical conditions may also need to wear the mask at other times when they are awake. Follow instructions from your health care provider about when to use the machine. What are some tips for using the mask?   Because the mask needs to be snug, some people feel trapped or closed-in (claustrophobic) when first using the mask. If you feel this way, you may need to get used to the mask. One way to do this is by holding the mask loosely over your nose or mouth and then gradually applying the mask more snugly. You can also gradually increase the amount of time that you use the mask.  Masks are available in various types and sizes. Some fit over your mouth and nose while others fit over just your nose. If your mask does not fit well, talk with your health care provider about getting a different one.  If you are using a mask that fits over your nose and you tend to breathe through your mouth, a chin strap may be applied to help keep your mouth closed.  The CPAP and BPAP machines have alarms that may sound if the mask comes off or develops a leak.  If you have trouble with the mask, it is very important that you talk with your health care provider about finding a  way to make the mask easier to tolerate. Do not stop using the mask. Stopping the use of the mask could have a negative impact on your health. What are some tips for using the machine?  Place your CPAP or BPAP machine on a secure table or stand near an electrical outlet.  Know where the on/off switch is located on the machine.  Follow instructions from your health care provider about how to set the pressure on your machine and when you should use it.  Do not eat or drink while the CPAP or BPAP machine is  on. Food or fluids could get pushed into your lungs by the pressure of the CPAP or BPAP.  Do not smoke. Tobacco smoke residue can damage the machine.  For home use, CPAP and BPAP machines can be rented or purchased through home health care companies. Many different brands of machines are available. Renting a machine before purchasing may help you find out which particular machine works well for you.  Keep the CPAP or BPAP machine and attachments clean. Ask your health care provider for specific instructions. Get help right away if:  You have redness or open areas around your nose or mouth where the mask fits.  You have trouble using the CPAP or BPAP machine.  You cannot tolerate wearing the CPAP or BPAP mask.  You have pain, discomfort, and bloating in your abdomen. Summary  CPAP and BPAP are methods of helping a person breathe with the use of air pressure.  Both CPAP and BPAP are provided by a small machine with a flexible plastic tube that attaches to a plastic mask.  If you have trouble with the mask, it is very important that you talk with your health care provider about finding a way to make the mask easier to tolerate. This information is not intended to replace advice given to you by your health care provider. Make sure you discuss any questions you have with your health care provider. Document Revised: 11/02/2018 Document Reviewed: 06/01/2016 Elsevier Patient Education  [place].

## 2020-02-07 DIAGNOSIS — M1990 Unspecified osteoarthritis, unspecified site: Secondary | ICD-10-CM | POA: Diagnosis not present

## 2020-02-07 DIAGNOSIS — I4891 Unspecified atrial fibrillation: Secondary | ICD-10-CM | POA: Diagnosis not present

## 2020-02-07 DIAGNOSIS — Z9989 Dependence on other enabling machines and devices: Secondary | ICD-10-CM | POA: Diagnosis not present

## 2020-02-07 DIAGNOSIS — G4733 Obstructive sleep apnea (adult) (pediatric): Secondary | ICD-10-CM | POA: Diagnosis not present

## 2020-02-07 DIAGNOSIS — I471 Supraventricular tachycardia: Secondary | ICD-10-CM | POA: Diagnosis not present

## 2020-02-07 DIAGNOSIS — Z8679 Personal history of other diseases of the circulatory system: Secondary | ICD-10-CM | POA: Diagnosis not present

## 2020-02-07 DIAGNOSIS — K589 Irritable bowel syndrome without diarrhea: Secondary | ICD-10-CM | POA: Diagnosis not present

## 2020-02-07 DIAGNOSIS — I4819 Other persistent atrial fibrillation: Secondary | ICD-10-CM | POA: Diagnosis not present

## 2020-02-07 DIAGNOSIS — M459 Ankylosing spondylitis of unspecified sites in spine: Secondary | ICD-10-CM | POA: Diagnosis not present

## 2020-02-07 DIAGNOSIS — K219 Gastro-esophageal reflux disease without esophagitis: Secondary | ICD-10-CM | POA: Diagnosis not present

## 2020-02-07 DIAGNOSIS — I341 Nonrheumatic mitral (valve) prolapse: Secondary | ICD-10-CM | POA: Diagnosis not present

## 2020-02-07 DIAGNOSIS — Z87891 Personal history of nicotine dependence: Secondary | ICD-10-CM | POA: Diagnosis not present

## 2020-02-07 DIAGNOSIS — Z9889 Other specified postprocedural states: Secondary | ICD-10-CM | POA: Diagnosis not present

## 2020-02-07 DIAGNOSIS — Z7901 Long term (current) use of anticoagulants: Secondary | ICD-10-CM | POA: Diagnosis not present

## 2020-03-18 NOTE — Progress Notes (Signed)
HPI: Follow-up atrial fibrillation. Nuclear study March 2006 was normal. Had atrial fibrillation ablation 02/19/10 in [place].Patient continues to be followed there. Abdominal ultrasound March 2017 showed no aneurysm. 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  At most recent office visit patient was found to have recurrent atrial tachycardia.  He underwent successful cardioversion May 2021.  Had repeat ablation medical [facility] [place].  Since last seen, there is no dyspnea, CP or syncope. Occasional palpitations not sustained.  Current Outpatient Medications  Medication Sig Dispense Refill  . acidophilus (RISAQUAD) CAPS capsule Take 1 capsule by mouth daily.    . Alpha-Lipoic Acid 600 MG CAPS Take 600 mg by mouth daily.    . Ascorbic Acid (VITAMIN C) 1000 MG tablet Take 1,000 mg by mouth daily.      Marland Kitchen b complex vitamins capsule Take 1 capsule by mouth daily.      . Cyanocobalamin 5000 MCG SUBL Place 1 tablet under the tongue daily.     Marland Kitchen ELIQUIS 5 MG TABS tablet TAKE 1 TABLET TWICE A DAY (Patient taking differently: Take 5 mg by mouth 2 (two) times daily. ) 180 tablet 3  . gabapentin (NEURONTIN) 300 MG capsule Take 1 capsule (300 mg total) by mouth 4 (four) times daily. 360 capsule 2  . Loperamide-Simethicone 2-125 MG TABS Take 1 tablet by mouth as needed.     . meloxicam (MOBIC) 15 MG tablet Take 15 mg by mouth daily as needed for pain.    . metoprolol succinate (TOPROL XL) 25 MG 24 hr tablet Take 1 tablet (25 mg total) by mouth daily. 90 tablet 3  . Multiple Vitamin (MULTIVITAMIN) tablet Take 1 tablet by mouth daily.      . Multiple Vitamins-Minerals (PRESERVISION AREDS 2+MULTI VIT) CAPS Take 1 tablet by mouth daily.     Marland Kitchen NEXIUM 40 MG capsule TAKE 1 CAPSULE DAILY (Patient taking differently: Take 40 mg by mouth daily. ) 90 capsule 3  . NON FORMULARY Take 2 capsules by mouth  daily. MITOQ    . pyridOXINE (VITAMIN B-6) 50 MG tablet Take 50 mg by mouth 2 (two) times daily.    . rosuvastatin (CRESTOR) 5 MG tablet Take 5 mg by mouth daily. Taking 1/2 tab every other day     No current facility-administered medications for this visit.     Past Medical History:  Diagnosis Date  . A-fib [facility])    cardioversion x2  . Ankylosing spondylitis ([place])   . Atrial flutter ([place])   . GERD (gastroesophageal reflux disease)   . Hemorrhoids   . Irregular heart beat   . MVP (mitral valve prolapse)    WITH A MIDSYSTOLIC CLICK  . OSA (obstructive sleep apnea)    moderate OSA NPSG 10/24/09 - AHO 21.8/hr  . Peripheral neuropathy   . Vitamin B12 deficiency     Past Surgical History:  Procedure Laterality Date  . ABLATION    . CARDIOVERSION N/A 12/20/2019   Procedure: CARDIOVERSION;  Surgeon: Jerline Pain, MD;  Location: [facility] ENDOSCOPY;  Service: Cardiovascular;  Laterality: N/A;  . COLONOSCOPY    . INGUINAL HERNIA REPAIR    . venous ligation     for varices left lower leg    Social History   Socioeconomic History  . Marital status: Married    Spouse name: Not on file  . Number of children: 2  . Years of education:  18  . Highest education level: Not on file  Occupational History  . Occupation: Scientist, clinical (histocompatibility and immunogenetics): [employer]  Tobacco Use  . Smoking status: Former Smoker    Packs/day: 0.00    Types: Cigarettes    Quit date: 07/27/1969    Years since quitting: 50.6  . Smokeless tobacco: Never Used  . Tobacco comment: less than 1 pack/day  Vaping Use  . Vaping Use: Never used  Substance and Sexual Activity  . Alcohol use: Yes    Comment: 1-2 most days  . Drug use: No  . Sexual activity: Not on file  Other Topics Concern  . Not on file  Social History Narrative  . Not on file   Social Determinants of Health   Financial Resource Strain:   . Difficulty of Paying Living Expenses: Not on file  Food Insecurity:   . Worried About Charity fundraiser in  the Last Year: Not on file  . Ran Out of Food in the Last Year: Not on file  Transportation Needs:   . Lack of Transportation (Medical): Not on file  . Lack of Transportation (Non-Medical): Not on file  Physical Activity:   . Days of Exercise per Week: Not on file  . Minutes of Exercise per Session: Not on file  Stress:   . Feeling of Stress : Not on file  Social Connections:   . Frequency of Communication with Friends and Family: Not on file  . Frequency of Social Gatherings with Friends and Family: Not on file  . Attends Religious Services: Not on file  . Active Member of Clubs or Organizations: Not on file  . Attends Archivist Meetings: Not on file  . Marital Status: Not on file  Intimate Partner Violence:   . Fear of Current or Ex-Partner: Not on file  . Emotionally Abused: Not on file  . Physically Abused: Not on file  . Sexually Abused: Not on file    Family History  Problem Relation Age of Onset  . Prostate cancer Father   . Arthritis Father   . Cancer Father   . Heart failure Mother   . Hypertension Mother     ROS: no fevers or chills, productive cough, hemoptysis, dysphasia, odynophagia, melena, hematochezia, dysuria, hematuria, rash, seizure activity, orthopnea, PND, pedal edema, claudication. Remaining systems are negative.  Physical Exam: Well-developed well-nourished in no acute distress.  Skin is warm and dry.  HEENT is normal.  Neck is supple.  Chest is clear to auscultation with normal expansion.  Cardiovascular exam is regular rate and rhythm.  Abdominal exam nontender or distended. No masses palpated. Extremities show no edema. neuro grossly intact  ECG-sinus rhythm with first-degree AV block, PACs, RV conduction delay, left anterior fascicular block.  Personally reviewed  A/P  1 paroxysmal atrial fibrillation-patient remains in sinus rhythm.  He is now status post ablation of atrial tachycardia as well.  Continue metoprolol and  apixaban.  2 moderate tricuspid regurgitation-most recent echocardiogram was unchanged.  We will plan follow-up studies in the future.  3 mitral valve prolapse-mitral regurgitation mild on most recent echocardiogram.  4 history of palpitations-continue beta-blocker.  Kirk Ruths, MD

## 2020-03-25 ENCOUNTER — Other Ambulatory Visit: Payer: Self-pay

## 2020-03-25 ENCOUNTER — Encounter: Payer: Self-pay | Admitting: Cardiology

## 2020-03-25 ENCOUNTER — Ambulatory Visit (INDEPENDENT_AMBULATORY_CARE_PROVIDER_SITE_OTHER): Payer: Medicare Other | Admitting: Cardiology

## 2020-03-25 VITALS — BP 118/78 | HR 74 | Ht 76.0 in | Wt 218.4 lb

## 2020-03-25 DIAGNOSIS — I480 Paroxysmal atrial fibrillation: Secondary | ICD-10-CM

## 2020-03-25 DIAGNOSIS — R002 Palpitations: Secondary | ICD-10-CM | POA: Diagnosis not present

## 2020-03-25 DIAGNOSIS — I361 Nonrheumatic tricuspid (valve) insufficiency: Secondary | ICD-10-CM

## 2020-03-25 NOTE — Patient Instructions (Signed)

## 2020-04-05 DIAGNOSIS — E785 Hyperlipidemia, unspecified: Secondary | ICD-10-CM | POA: Diagnosis not present

## 2020-04-05 DIAGNOSIS — Z125 Encounter for screening for malignant neoplasm of prostate: Secondary | ICD-10-CM | POA: Diagnosis not present

## 2020-04-08 ENCOUNTER — Ambulatory Visit: Payer: Medicare Other | Admitting: Internal Medicine

## 2020-04-08 DIAGNOSIS — I4891 Unspecified atrial fibrillation: Secondary | ICD-10-CM | POA: Diagnosis not present

## 2020-04-08 DIAGNOSIS — M1990 Unspecified osteoarthritis, unspecified site: Secondary | ICD-10-CM | POA: Diagnosis not present

## 2020-04-08 DIAGNOSIS — S01111A Laceration without foreign body of right eyelid and periocular area, initial encounter: Secondary | ICD-10-CM | POA: Diagnosis not present

## 2020-04-08 DIAGNOSIS — S0101XA Laceration without foreign body of scalp, initial encounter: Secondary | ICD-10-CM | POA: Diagnosis not present

## 2020-04-08 DIAGNOSIS — G8911 Acute pain due to trauma: Secondary | ICD-10-CM | POA: Diagnosis not present

## 2020-04-08 DIAGNOSIS — Z79899 Other long term (current) drug therapy: Secondary | ICD-10-CM | POA: Diagnosis not present

## 2020-04-08 DIAGNOSIS — G629 Polyneuropathy, unspecified: Secondary | ICD-10-CM | POA: Diagnosis not present

## 2020-04-08 DIAGNOSIS — Z7901 Long term (current) use of anticoagulants: Secondary | ICD-10-CM | POA: Diagnosis not present

## 2020-04-08 DIAGNOSIS — S0003XA Contusion of scalp, initial encounter: Secondary | ICD-10-CM | POA: Diagnosis not present

## 2020-04-08 DIAGNOSIS — H3530 Unspecified macular degeneration: Secondary | ICD-10-CM | POA: Diagnosis not present

## 2020-04-08 DIAGNOSIS — S0990XA Unspecified injury of head, initial encounter: Secondary | ICD-10-CM | POA: Diagnosis not present

## 2020-04-08 DIAGNOSIS — M1711 Unilateral primary osteoarthritis, right knee: Secondary | ICD-10-CM | POA: Diagnosis not present

## 2020-04-08 DIAGNOSIS — M25511 Pain in right shoulder: Secondary | ICD-10-CM | POA: Diagnosis not present

## 2020-04-12 DIAGNOSIS — R2689 Other abnormalities of gait and mobility: Secondary | ICD-10-CM | POA: Diagnosis not present

## 2020-04-12 DIAGNOSIS — G4733 Obstructive sleep apnea (adult) (pediatric): Secondary | ICD-10-CM | POA: Diagnosis not present

## 2020-04-12 DIAGNOSIS — N401 Enlarged prostate with lower urinary tract symptoms: Secondary | ICD-10-CM | POA: Diagnosis not present

## 2020-04-12 DIAGNOSIS — I2721 Secondary pulmonary arterial hypertension: Secondary | ICD-10-CM | POA: Diagnosis not present

## 2020-04-12 DIAGNOSIS — I480 Paroxysmal atrial fibrillation: Secondary | ICD-10-CM | POA: Diagnosis not present

## 2020-04-12 DIAGNOSIS — G629 Polyneuropathy, unspecified: Secondary | ICD-10-CM | POA: Diagnosis not present

## 2020-04-12 DIAGNOSIS — R82998 Other abnormal findings in urine: Secondary | ICD-10-CM | POA: Diagnosis not present

## 2020-04-12 DIAGNOSIS — Z0000 Encounter for general adult medical examination without abnormal findings: Secondary | ICD-10-CM | POA: Diagnosis not present

## 2020-04-12 DIAGNOSIS — Z7901 Long term (current) use of anticoagulants: Secondary | ICD-10-CM | POA: Diagnosis not present

## 2020-04-12 DIAGNOSIS — Z23 Encounter for immunization: Secondary | ICD-10-CM | POA: Diagnosis not present

## 2020-04-12 DIAGNOSIS — I341 Nonrheumatic mitral (valve) prolapse: Secondary | ICD-10-CM | POA: Diagnosis not present

## 2020-04-12 DIAGNOSIS — E785 Hyperlipidemia, unspecified: Secondary | ICD-10-CM | POA: Diagnosis not present

## 2020-04-12 DIAGNOSIS — I8390 Asymptomatic varicose veins of unspecified lower extremity: Secondary | ICD-10-CM | POA: Diagnosis not present

## 2020-04-23 DIAGNOSIS — H43813 Vitreous degeneration, bilateral: Secondary | ICD-10-CM | POA: Diagnosis not present

## 2020-04-23 DIAGNOSIS — H2513 Age-related nuclear cataract, bilateral: Secondary | ICD-10-CM | POA: Diagnosis not present

## 2020-04-23 DIAGNOSIS — H353211 Exudative age-related macular degeneration, right eye, with active choroidal neovascularization: Secondary | ICD-10-CM | POA: Diagnosis not present

## 2020-04-23 DIAGNOSIS — H353122 Nonexudative age-related macular degeneration, left eye, intermediate dry stage: Secondary | ICD-10-CM | POA: Diagnosis not present

## 2020-04-30 DIAGNOSIS — H40013 Open angle with borderline findings, low risk, bilateral: Secondary | ICD-10-CM | POA: Diagnosis not present

## 2020-05-02 ENCOUNTER — Other Ambulatory Visit: Payer: Self-pay | Admitting: Sports Medicine

## 2020-05-28 DIAGNOSIS — D229 Melanocytic nevi, unspecified: Secondary | ICD-10-CM | POA: Diagnosis not present

## 2020-05-28 DIAGNOSIS — I8393 Asymptomatic varicose veins of bilateral lower extremities: Secondary | ICD-10-CM | POA: Diagnosis not present

## 2020-05-28 DIAGNOSIS — L304 Erythema intertrigo: Secondary | ICD-10-CM | POA: Diagnosis not present

## 2020-05-28 DIAGNOSIS — D1801 Hemangioma of skin and subcutaneous tissue: Secondary | ICD-10-CM | POA: Diagnosis not present

## 2020-05-28 DIAGNOSIS — L821 Other seborrheic keratosis: Secondary | ICD-10-CM | POA: Diagnosis not present

## 2020-05-28 DIAGNOSIS — L720 Epidermal cyst: Secondary | ICD-10-CM | POA: Diagnosis not present

## 2020-05-28 DIAGNOSIS — D485 Neoplasm of uncertain behavior of skin: Secondary | ICD-10-CM | POA: Diagnosis not present

## 2020-05-28 DIAGNOSIS — L814 Other melanin hyperpigmentation: Secondary | ICD-10-CM | POA: Diagnosis not present

## 2020-06-04 DIAGNOSIS — H25043 Posterior subcapsular polar age-related cataract, bilateral: Secondary | ICD-10-CM | POA: Diagnosis not present

## 2020-06-04 DIAGNOSIS — H2511 Age-related nuclear cataract, right eye: Secondary | ICD-10-CM | POA: Diagnosis not present

## 2020-06-04 DIAGNOSIS — H2513 Age-related nuclear cataract, bilateral: Secondary | ICD-10-CM | POA: Diagnosis not present

## 2020-06-04 DIAGNOSIS — H18413 Arcus senilis, bilateral: Secondary | ICD-10-CM | POA: Diagnosis not present

## 2020-06-04 DIAGNOSIS — H25013 Cortical age-related cataract, bilateral: Secondary | ICD-10-CM | POA: Diagnosis not present

## 2020-06-04 DIAGNOSIS — H353211 Exudative age-related macular degeneration, right eye, with active choroidal neovascularization: Secondary | ICD-10-CM | POA: Diagnosis not present

## 2020-06-04 DIAGNOSIS — H353122 Nonexudative age-related macular degeneration, left eye, intermediate dry stage: Secondary | ICD-10-CM | POA: Diagnosis not present

## 2020-06-05 ENCOUNTER — Ambulatory Visit: Payer: Medicare Other | Admitting: Internal Medicine

## 2020-06-11 DIAGNOSIS — I471 Supraventricular tachycardia: Secondary | ICD-10-CM | POA: Diagnosis not present

## 2020-06-11 DIAGNOSIS — R6889 Other general symptoms and signs: Secondary | ICD-10-CM | POA: Diagnosis not present

## 2020-06-11 DIAGNOSIS — Z9889 Other specified postprocedural states: Secondary | ICD-10-CM | POA: Diagnosis not present

## 2020-06-11 DIAGNOSIS — Z8679 Personal history of other diseases of the circulatory system: Secondary | ICD-10-CM | POA: Diagnosis not present

## 2020-06-11 DIAGNOSIS — Z7901 Long term (current) use of anticoagulants: Secondary | ICD-10-CM | POA: Diagnosis not present

## 2020-06-11 DIAGNOSIS — I4819 Other persistent atrial fibrillation: Secondary | ICD-10-CM | POA: Diagnosis not present

## 2020-06-12 ENCOUNTER — Other Ambulatory Visit: Payer: Self-pay | Admitting: Cardiology

## 2020-06-13 NOTE — Telephone Encounter (Signed)
Dx A.Fib Last OV Dr Stanford Breed on 02/2020 79yo Male 99.1kg Scr 1.00 on 11/2019

## 2020-06-21 DIAGNOSIS — I471 Supraventricular tachycardia: Secondary | ICD-10-CM | POA: Diagnosis not present

## 2020-06-21 DIAGNOSIS — I4819 Other persistent atrial fibrillation: Secondary | ICD-10-CM | POA: Diagnosis not present

## 2020-07-03 DIAGNOSIS — M150 Primary generalized (osteo)arthritis: Secondary | ICD-10-CM | POA: Diagnosis not present

## 2020-07-03 DIAGNOSIS — M450 Ankylosing spondylitis of multiple sites in spine: Secondary | ICD-10-CM | POA: Diagnosis not present

## 2020-07-03 DIAGNOSIS — E663 Overweight: Secondary | ICD-10-CM | POA: Diagnosis not present

## 2020-07-03 DIAGNOSIS — Z6827 Body mass index (BMI) 27.0-27.9, adult: Secondary | ICD-10-CM | POA: Diagnosis not present

## 2020-07-03 DIAGNOSIS — M5136 Other intervertebral disc degeneration, lumbar region: Secondary | ICD-10-CM | POA: Diagnosis not present

## 2020-07-03 DIAGNOSIS — I480 Paroxysmal atrial fibrillation: Secondary | ICD-10-CM | POA: Diagnosis not present

## 2020-07-11 DIAGNOSIS — Z1212 Encounter for screening for malignant neoplasm of rectum: Secondary | ICD-10-CM | POA: Diagnosis not present

## 2020-07-30 DIAGNOSIS — H353211 Exudative age-related macular degeneration, right eye, with active choroidal neovascularization: Secondary | ICD-10-CM | POA: Diagnosis not present

## 2020-07-30 DIAGNOSIS — H43813 Vitreous degeneration, bilateral: Secondary | ICD-10-CM | POA: Diagnosis not present

## 2020-07-30 DIAGNOSIS — H35432 Paving stone degeneration of retina, left eye: Secondary | ICD-10-CM | POA: Diagnosis not present

## 2020-07-30 DIAGNOSIS — H353122 Nonexudative age-related macular degeneration, left eye, intermediate dry stage: Secondary | ICD-10-CM | POA: Diagnosis not present

## 2020-08-12 ENCOUNTER — Ambulatory Visit: Payer: Medicare Other | Admitting: Internal Medicine

## 2020-08-19 DIAGNOSIS — H2511 Age-related nuclear cataract, right eye: Secondary | ICD-10-CM | POA: Diagnosis not present

## 2020-08-20 DIAGNOSIS — H2512 Age-related nuclear cataract, left eye: Secondary | ICD-10-CM | POA: Diagnosis not present

## 2020-09-09 DIAGNOSIS — H2512 Age-related nuclear cataract, left eye: Secondary | ICD-10-CM | POA: Diagnosis not present

## 2020-09-19 NOTE — Progress Notes (Signed)
HPI: Follow-up atrial fibrillation. Nuclear study March 2006 was normal. Had atrial fibrillation ablation 02/19/10 in [place].Patient continues to be followed there. Abdominal ultrasound March 2017 showed no aneurysm. 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  At most recent office visit patient was found to have recurrent atrial tachycardia.  He underwent successful cardioversion May 2021.  Had repeat ablation medical [facility] 7/21.  Since last seen,patient denies dyspnea, chest pain, palpitations or syncope.  Current Outpatient Medications  Medication Sig Dispense Refill  . acidophilus (RISAQUAD) CAPS capsule Take 1 capsule by mouth daily.    . Alpha-Lipoic Acid 600 MG CAPS Take 600 mg by mouth daily.    Marland Kitchen apixaban (ELIQUIS) 5 MG TABS tablet Take 1 tablet (5 mg total) by mouth 2 (two) times daily. 180 tablet 1  . Ascorbic Acid (VITAMIN C) 1000 MG tablet Take 1,000 mg by mouth daily.    Marland Kitchen b complex vitamins capsule Take 1 capsule by mouth daily.    . Cyanocobalamin 5000 MCG SUBL Place 1 tablet under the tongue daily.     Marland Kitchen gabapentin (NEURONTIN) 300 MG capsule TAKE 1 CAPSULE FOUR TIMES A DAY 360 capsule 3  . Loperamide-Simethicone 2-125 MG TABS Take 1 tablet by mouth as needed.     . meloxicam (MOBIC) 15 MG tablet Take 15 mg by mouth daily as needed for pain.    . metoprolol succinate (TOPROL XL) 25 MG 24 hr tablet Take 1 tablet (25 mg total) by mouth daily. 90 tablet 3  . Multiple Vitamin (MULTIVITAMIN) tablet Take 1 tablet by mouth daily.    . Multiple Vitamins-Minerals (PRESERVISION AREDS 2+MULTI VIT) CAPS Take 1 tablet by mouth daily.     Marland Kitchen NEXIUM 40 MG capsule TAKE 1 CAPSULE DAILY (Patient taking differently: Take 40 mg by mouth daily.) 90 capsule 3  . NON FORMULARY Take 2 capsules by mouth daily. MITOQ    . pyridOXINE (VITAMIN B-6) 50 MG tablet Take 50 mg by  mouth 2 (two) times daily.    . rosuvastatin (CRESTOR) 5 MG tablet Take 5 mg by mouth daily. Taking 1/2 tab every other day     No current facility-administered medications for this visit.     Past Medical History:  Diagnosis Date  . A-fib [facility])    cardioversion x2  . Ankylosing spondylitis ([place])   . Atrial flutter ([place])   . GERD (gastroesophageal reflux disease)   . Hemorrhoids   . Irregular heart beat   . MVP (mitral valve prolapse)    WITH A MIDSYSTOLIC CLICK  . OSA (obstructive sleep apnea)    moderate OSA NPSG 10/24/09 - AHO 21.8/hr  . Peripheral neuropathy   . Vitamin B12 deficiency     Past Surgical History:  Procedure Laterality Date  . ABLATION    . CARDIOVERSION N/A 12/20/2019   Procedure: CARDIOVERSION;  Surgeon: Jerline Pain, MD;  Location: [facility] ENDOSCOPY;  Service: Cardiovascular;  Laterality: N/A;  . COLONOSCOPY    . INGUINAL HERNIA REPAIR    . venous ligation     for varices left lower leg    Social History   Socioeconomic History  . Marital status: Married    Spouse name: Not on file  . Number of children: 2  . Years of education: 48  . Highest education level: Not on file  Occupational History  . Occupation: Scientist, clinical (histocompatibility and immunogenetics): [employer]  Tobacco Use  . Smoking status: Former Smoker    Packs/day: 0.00    Types: Cigarettes    Quit date: 07/27/1969    Years since quitting: 51.2  . Smokeless tobacco: Never Used  . Tobacco comment: less than 1 pack/day  Vaping Use  . Vaping Use: Never used  Substance and Sexual Activity  . Alcohol use: Yes    Comment: 1-2 most days  . Drug use: No  . Sexual activity: Not on file  Other Topics Concern  . Not on file  Social History Narrative  . Not on file   Social Determinants of Health   Financial Resource Strain: Not on file  Food Insecurity: Not on file  Transportation Needs: Not on file  Physical Activity: Not on file  Stress: Not on file  Social Connections: Not on file  Intimate Partner  Violence: Not on file    Family History  Problem Relation Age of Onset  . Prostate cancer Father   . Arthritis Father   . Cancer Father   . Heart failure Mother   . Hypertension Mother     ROS: no fevers or chills, productive cough, hemoptysis, dysphasia, odynophagia, melena, hematochezia, dysuria, hematuria, rash, seizure activity, orthopnea, PND, pedal edema, claudication. Remaining systems are negative.  Physical Exam: Well-developed well-nourished in no acute distress.  Skin is warm and dry.  HEENT is normal.  Neck is supple.  Chest is clear to auscultation with normal expansion.  Cardiovascular exam is regular rate and rhythm.  Abdominal exam nontender or distended. No masses palpated. Extremities show no edema. neuro grossly intact  ECG- personally reviewed  A/P  1 paroxysmal atrial fibrillation-patient is in sinus rhythm today.  He has had previous ablation of atrial tachycardia as well.  We will continue metoprolol and apixaban.  2 history of palpitations-symptoms are reasonably well controlled.  Continue beta-blocker.  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  Kirk Ruths, MD

## 2020-09-30 ENCOUNTER — Ambulatory Visit (INDEPENDENT_AMBULATORY_CARE_PROVIDER_SITE_OTHER): Payer: Medicare Other | Admitting: Cardiology

## 2020-09-30 ENCOUNTER — Other Ambulatory Visit: Payer: Self-pay

## 2020-09-30 ENCOUNTER — Encounter: Payer: Self-pay | Admitting: Cardiology

## 2020-09-30 VITALS — BP 118/72 | HR 68 | Ht 75.0 in | Wt 220.6 lb

## 2020-09-30 DIAGNOSIS — R002 Palpitations: Secondary | ICD-10-CM

## 2020-09-30 DIAGNOSIS — I480 Paroxysmal atrial fibrillation: Secondary | ICD-10-CM | POA: Diagnosis not present

## 2020-09-30 DIAGNOSIS — I361 Nonrheumatic tricuspid (valve) insufficiency: Secondary | ICD-10-CM | POA: Diagnosis not present

## 2020-09-30 NOTE — Patient Instructions (Signed)

## 2020-10-04 ENCOUNTER — Encounter: Payer: Self-pay | Admitting: Adult Health

## 2020-10-04 ENCOUNTER — Ambulatory Visit (INDEPENDENT_AMBULATORY_CARE_PROVIDER_SITE_OTHER): Payer: Medicare Other | Admitting: Adult Health

## 2020-10-04 ENCOUNTER — Other Ambulatory Visit: Payer: Self-pay

## 2020-10-04 VITALS — BP 110/70 | HR 75 | Temp 97.8°F | Ht 75.0 in | Wt 221.8 lb

## 2020-10-04 DIAGNOSIS — R490 Dysphonia: Secondary | ICD-10-CM

## 2020-10-04 DIAGNOSIS — G4733 Obstructive sleep apnea (adult) (pediatric): Secondary | ICD-10-CM | POA: Diagnosis not present

## 2020-10-04 NOTE — Assessment & Plan Note (Signed)
Excellent control and compliance on nocturnal CPAP.  Continue on current settings  Plan  Patient Instructions  Continue on CPAP At bedtime   Keep up good work .  Remain active  Do not drive if sleepy  Saline nasal rinses and gel As needed    Refer to ENT for chronic hoarseness.   Follow up with Dr. Annamaria Boots  In 1 year and As needed

## 2020-10-04 NOTE — Patient Instructions (Signed)
Continue on CPAP At bedtime   Keep up good work .  Remain active  Do not drive if sleepy  Saline nasal rinses and gel As needed    Refer to ENT for chronic hoarseness.   Follow up with Dr. Annamaria Boots  In 1 year and As needed

## 2020-10-04 NOTE — Assessment & Plan Note (Signed)
Hoarseness and voice instability over the last year questionable etiology.  Will need further evaluation.  Refer to ENT.

## 2020-10-04 NOTE — Progress Notes (Signed)
@Patient  ID: Wesley Chris., male    DOB: 10/20/1940, 80 y.o.   MRN: [id number]  Chief Complaint  Patient presents with  . Follow-up    Referring provider: Shon Baton, MD  HPI: 80 year old male former smoker  followed for obstructive sleep apnea Medical history significant for peripheral neuropathy, hx ankylosing spondylitis, a flutter on anticoagulation, osteoarthritis  TEST/EVENTS :  HST-07/06/2018-AHI 17.5/hour, desaturation to 82%, body weight 216 pound    10/04/2020 Follow up ; OSA  Patient returns for a follow-up visit.  Patient has underlying obstructive sleep apnea.  Is on nocturnal CPAP.  He says he is doing well on CPAP.  He feels rested with no significant daytime sleepiness and feels that he benefits from CPAP. CPAP download shows excellent compliance with 100% usage.  Daily average usage at 7 hours.  Patient is on auto CPAP 8 to 15 cm H2O.  AHI 6.2.  (Central 1.2) minimal leaks. Uses a full facemask  Patient complains over the last year that he has had chronic hoarseness speech variability.  He is concerned that CPAP is contributing this.  He denies any significant postnasal drainage or nasal stuffiness.  He denies any reflux.  Talking seems to make his hoarseness and speech worse.  He denies any unintentional weight loss, hemoptysis.  He denies any dysphagia.    No Known Allergies  Immunization History  Administered Date(s) Administered  . Fluad Quad(high Dose 65+) 03/27/2020  . Influenza Split 06/05/2011  . Influenza Whole 04/17/2010  . Influenza, High Dose Seasonal PF 04/26/2018, 03/16/2019  . Influenza-Unspecified 05/08/2009, 05/23/2015  . PFIZER(Purple Top)SARS-COV-2 Vaccination 08/05/2019, 08/26/2019, 04/29/2020  . Zoster Recombinat (Shingrix) 09/09/2017    Past Medical History:  Diagnosis Date  . A-fib [facility])    cardioversion x2  . Ankylosing spondylitis ([place])   . Atrial flutter ([place])   . GERD (gastroesophageal reflux disease)   . Hemorrhoids    . Irregular heart beat   . MVP (mitral valve prolapse)    WITH A MIDSYSTOLIC CLICK  . OSA (obstructive sleep apnea)    moderate OSA NPSG 10/24/09 - AHO 21.8/hr  . Peripheral neuropathy   . Vitamin B12 deficiency     Tobacco History: Social History   Tobacco Use  Smoking Status Former Smoker  . Packs/day: 0.00  . Types: Cigarettes  . Quit date: 07/27/1969  . Years since quitting: 51.2  Smokeless Tobacco Never Used  Tobacco Comment   less than 1 pack/day   Counseling given: Not Answered Comment: less than 1 pack/day   Outpatient Medications Prior to Visit  Medication Sig Dispense Refill  . acidophilus (RISAQUAD) CAPS capsule Take 1 capsule by mouth daily.    . Alpha-Lipoic Acid 600 MG CAPS Take 600 mg by mouth daily.    Marland Kitchen apixaban (ELIQUIS) 5 MG TABS tablet Take 1 tablet (5 mg total) by mouth 2 (two) times daily. 180 tablet 1  . Ascorbic Acid (VITAMIN C) 1000 MG tablet Take 1,000 mg by mouth daily.    Marland Kitchen b complex vitamins capsule Take 1 capsule by mouth daily.    . Cyanocobalamin 5000 MCG SUBL Place 1 tablet under the tongue daily.     Marland Kitchen gabapentin (NEURONTIN) 300 MG capsule TAKE 1 CAPSULE FOUR TIMES A DAY 360 capsule 3  . Loperamide-Simethicone 2-125 MG TABS Take 1 tablet by mouth as needed.     . meloxicam (MOBIC) 15 MG tablet Take 15 mg by mouth daily as needed for pain.    . metoprolol  succinate (TOPROL XL) 25 MG 24 hr tablet Take 1 tablet (25 mg total) by mouth daily. 90 tablet 3  . Multiple Vitamin (MULTIVITAMIN) tablet Take 1 tablet by mouth daily.    . Multiple Vitamins-Minerals (PRESERVISION AREDS 2+MULTI VIT) CAPS Take 1 tablet by mouth daily.     Marland Kitchen NEXIUM 40 MG capsule TAKE 1 CAPSULE DAILY (Patient taking differently: Take 40 mg by mouth daily.) 90 capsule 3  . NON FORMULARY Take 2 capsules by mouth daily. MITOQ    . pyridOXINE (VITAMIN B-6) 50 MG tablet Take 50 mg by mouth 2 (two) times daily.    . rosuvastatin (CRESTOR) 5 MG tablet Take 5 mg by mouth daily. Taking  1/2 tab every other day     No facility-administered medications prior to visit.     Review of Systems:   Constitutional:   No  weight loss, night sweats,  Fevers, chills, fatigue, or  lassitude.  HEENT:   No headaches,  Difficulty swallowing,  Tooth/dental problems, or  Sore throat,                No sneezing, itching, ear ache, nasal congestion, post nasal drip, positive hoarseness  CV:  No chest pain,  Orthopnea, PND, swelling in lower extremities, anasarca, dizziness, palpitations, syncope.   GI  No heartburn, indigestion, abdominal pain, nausea, vomiting, diarrhea, change in bowel habits, loss of appetite, bloody stools.   Resp: No shortness of breath with exertion or at rest.  No excess mucus, no productive cough,  No non-productive cough,  No coughing up of blood.  No change in color of mucus.  No wheezing.  No chest wall deformity  Skin: no rash or lesions.  GU: no dysuria, change in color of urine, no urgency or frequency.  No flank pain, no hematuria   MS:  No joint pain or swelling.  No decreased range of motion.  No back pain.    Physical Exam  BP 110/70 (BP Location: Left Arm, Patient Position: Sitting, Cuff Size: Normal)   Pulse 75   Temp 97.8 F (36.6 C) (Temporal)   Ht 6\' 3"  (1.905 m)   Wt 221 lb 12.8 oz (100.6 kg)   SpO2 97%   BMI 27.72 kg/m   GEN: A/Ox3; pleasant , NAD, well nourished    HEENT:  [place]/AT,    NOSE-clear, THROAT-clear, no lesions, no postnasal drip or exudate noted.  Posterior pharynx is clear with no visible lesions noted.  NECK:  Supple w/ fair ROM; no JVD; normal carotid impulses w/o bruits; no thyromegaly or nodules palpated; no lymphadenopathy.    RESP  Clear  P & A; w/o, wheezes/ rales/ or rhonchi. no accessory muscle use, no dullness to percussion  CARD:  RRR, no m/r/g, no peripheral edema, pulses intact, no cyanosis or clubbing.  GI:   Soft & nt; nml bowel sounds; no organomegaly or masses detected.   Musco: Warm bil, no  deformities or joint swelling noted.   Neuro: alert, no focal deficits noted.    Skin: Warm, no lesions or rashes    Lab Results:  BNP No results found for: BNP  ProBNP No results found for: PROBNP  Imaging: No results found.    No flowsheet data found.  No results found for: NITRICOXIDE      Assessment & Plan:   OBSTRUCTIVE SLEEP APNEA Excellent control and compliance on nocturnal CPAP.  Continue on current settings  Plan  Patient Instructions  Continue on CPAP At bedtime  Keep up good work .  Remain active  Do not drive if sleepy  Saline nasal rinses and gel As needed    Refer to ENT for chronic hoarseness.   Follow up with Dr. Annamaria Boots  In 1 year and As needed          Chronic hoarseness Hoarseness and voice instability over the last year questionable etiology.  Will need further evaluation.  Refer to ENT.     Rexene Edison, NP 10/04/2020

## 2020-10-07 ENCOUNTER — Telehealth: Payer: Self-pay | Admitting: Adult Health

## 2020-10-07 DIAGNOSIS — R490 Dysphonia: Secondary | ICD-10-CM

## 2020-10-07 NOTE — Telephone Encounter (Signed)
New referral placed for Dr. Wilburn Cornelia for ENT per pts request.

## 2020-10-24 ENCOUNTER — Ambulatory Visit (INDEPENDENT_AMBULATORY_CARE_PROVIDER_SITE_OTHER): Payer: Medicare Other | Admitting: Otolaryngology

## 2020-10-29 ENCOUNTER — Ambulatory Visit: Payer: Medicare Other | Admitting: Internal Medicine

## 2020-11-19 DIAGNOSIS — H35432 Paving stone degeneration of retina, left eye: Secondary | ICD-10-CM | POA: Diagnosis not present

## 2020-11-19 DIAGNOSIS — H353212 Exudative age-related macular degeneration, right eye, with inactive choroidal neovascularization: Secondary | ICD-10-CM | POA: Diagnosis not present

## 2020-11-19 DIAGNOSIS — H43813 Vitreous degeneration, bilateral: Secondary | ICD-10-CM | POA: Diagnosis not present

## 2020-11-19 DIAGNOSIS — H353122 Nonexudative age-related macular degeneration, left eye, intermediate dry stage: Secondary | ICD-10-CM | POA: Diagnosis not present

## 2020-11-30 ENCOUNTER — Other Ambulatory Visit: Payer: Self-pay | Admitting: Cardiology

## 2020-11-30 DIAGNOSIS — I4891 Unspecified atrial fibrillation: Secondary | ICD-10-CM

## 2020-12-02 ENCOUNTER — Other Ambulatory Visit: Payer: Self-pay

## 2020-12-02 DIAGNOSIS — I4891 Unspecified atrial fibrillation: Secondary | ICD-10-CM

## 2020-12-02 MED ORDER — METOPROLOL SUCCINATE ER 25 MG PO TB24: 25.0000 mg | ORAL_TABLET | Freq: Every day | ORAL | 3 refills | Status: DC

## 2020-12-02 NOTE — Telephone Encounter (Signed)
3m, 100.6kg, scr 0.7 02/07/20, lovw/crenshaw 09/30/20

## 2020-12-03 DIAGNOSIS — J342 Deviated nasal septum: Secondary | ICD-10-CM | POA: Diagnosis not present

## 2020-12-03 DIAGNOSIS — J3489 Other specified disorders of nose and nasal sinuses: Secondary | ICD-10-CM | POA: Diagnosis not present

## 2020-12-03 DIAGNOSIS — R490 Dysphonia: Secondary | ICD-10-CM | POA: Diagnosis not present

## 2020-12-03 DIAGNOSIS — G4733 Obstructive sleep apnea (adult) (pediatric): Secondary | ICD-10-CM | POA: Diagnosis not present

## 2020-12-03 DIAGNOSIS — J343 Hypertrophy of nasal turbinates: Secondary | ICD-10-CM | POA: Diagnosis not present

## 2020-12-03 DIAGNOSIS — Z9989 Dependence on other enabling machines and devices: Secondary | ICD-10-CM | POA: Diagnosis not present

## 2021-01-28 DIAGNOSIS — Z20822 Contact with and (suspected) exposure to covid-19: Secondary | ICD-10-CM | POA: Diagnosis not present

## 2021-02-18 DIAGNOSIS — H353212 Exudative age-related macular degeneration, right eye, with inactive choroidal neovascularization: Secondary | ICD-10-CM | POA: Diagnosis not present

## 2021-03-19 ENCOUNTER — Telehealth: Payer: Self-pay

## 2021-03-19 DIAGNOSIS — K580 Irritable bowel syndrome with diarrhea: Secondary | ICD-10-CM | POA: Diagnosis not present

## 2021-03-19 DIAGNOSIS — Z01818 Encounter for other preprocedural examination: Secondary | ICD-10-CM

## 2021-03-19 DIAGNOSIS — Z1211 Encounter for screening for malignant neoplasm of colon: Secondary | ICD-10-CM | POA: Diagnosis not present

## 2021-03-19 DIAGNOSIS — I480 Paroxysmal atrial fibrillation: Secondary | ICD-10-CM

## 2021-03-19 DIAGNOSIS — Z7901 Long term (current) use of anticoagulants: Secondary | ICD-10-CM | POA: Diagnosis not present

## 2021-03-19 DIAGNOSIS — R143 Flatulence: Secondary | ICD-10-CM | POA: Diagnosis not present

## 2021-03-19 NOTE — Telephone Encounter (Signed)
   [facility] Pre-operative Risk Assessment    Patient Name: Wesley [name].  DOB: 18-Jan-1941 MRN: [id number]  HEARTCARE STAFF:  - IMPORTANT!!!!!! Under Visit Info/Reason for Call, type in Other and utilize the format Clearance MM/DD/YY or Clearance TBD. Do not use dashes or single digits. - Please review there is not already an duplicate clearance open for this procedure. - If request is for dental extraction, please clarify the # of teeth to be extracted. - If the patient is currently at the dentist's office, call Pre-Op Callback Staff (MA/nurse) to input urgent request.  - If the patient is not currently in the dentist office, please route to the Pre-Op pool.  Request for surgical clearance:  What type of surgery is being performed? Colonoscopy  When is this surgery scheduled? 08.29.22  What type of clearance is required (medical clearance vs. Pharmacy clearance to hold med vs. Both)? Medical Are there any medications that need to be held prior to surgery and how long? Eliquis day before and Morning of Procedure  Practice name and name of physician performing surgery? [facility]  What is the office phone number? [phone]   7.   What is the office fax number? [phone]  8.   Anesthesia type (None, local, MAC, general) ? Unknown   Zebedee Iba 03/19/2021, 2:46 PM  _________________________________________________________________   (provider comments below)

## 2021-03-20 ENCOUNTER — Other Ambulatory Visit: Payer: Self-pay

## 2021-03-20 ENCOUNTER — Other Ambulatory Visit: Payer: Medicare Other | Admitting: *Deleted

## 2021-03-20 DIAGNOSIS — Z01818 Encounter for other preprocedural examination: Secondary | ICD-10-CM

## 2021-03-20 DIAGNOSIS — I480 Paroxysmal atrial fibrillation: Secondary | ICD-10-CM

## 2021-03-20 LAB — BASIC METABOLIC PANEL
BUN/Creatinine Ratio: 15 (ref 10–24)
BUN: 12 mg/dL (ref 8–27)
CO2: 25 mmol/L (ref 20–29)
Calcium: 8.9 mg/dL (ref 8.6–10.2)
Chloride: 102 mmol/L (ref 96–106)
Creatinine, Ser: 0.81 mg/dL (ref 0.76–1.27)
Glucose: 88 mg/dL (ref 65–99)
Potassium: 4.5 mmol/L (ref 3.5–5.2)
Sodium: 141 mmol/L (ref 134–144)
eGFR: 90 mL/min/{1.73_m2} (ref >59)

## 2021-03-20 LAB — CBC
Hematocrit: 38.7 % (ref 37.5–51.0)
Hemoglobin: 13.2 g/dL (ref 13.0–17.7)
MCH: 31.5 pg (ref 26.6–33.0)
MCHC: 34.1 g/dL (ref 31.5–35.7)
MCV: 92 fL (ref 79–97)
Platelets: 236 x10E3/uL (ref 150–450)
RBC: 4.19 x10E6/uL (ref 4.14–5.80)
RDW: 12.7 % (ref 11.6–15.4)
WBC: 4.7 x10E3/uL (ref 3.4–10.8)

## 2021-03-20 NOTE — Telephone Encounter (Signed)
Can we schedule this patient for BMET and CBC to assist pharmacy for holding rec request?

## 2021-03-20 NOTE — Telephone Encounter (Signed)
Pt came in today and did have lab work done for pre op clearance. I will update the pre op provider and the Pharm-D.

## 2021-03-20 NOTE — Telephone Encounter (Signed)
Left very detailed message for pt that he is going to need lab work for pre op clearance, BMET, CBC. I will place these orders in to be done at [place] for today. I did ask for the pt to please call the office back and confirm he can come in today for the lab work. Colonoscopy is set for 03/24/21 Monday, so pt will need labs to in order for our team to clear pt in time.

## 2021-03-20 NOTE — Telephone Encounter (Signed)
Patient with diagnosis of A Fib on Eliquis for anticoagulation.    Procedure: colonoscopy Date of procedure: 03/24/21   CHA2DS2-VASc Score = 2  This indicates a 2.2% annual risk of stroke. The patient's score is based upon: CHF History: No HTN History: No Diabetes History: No Stroke History: No Vascular Disease History: No Age Score: 2 Gender Score: 0   Patient most recent labs from 7/21.  Will need updated BMP and CBC to complete clearance

## 2021-03-21 NOTE — Telephone Encounter (Signed)
    Patient Name: Wesley [name].  DOB: 1940-09-13 MRN: [id number]  Primary Cardiologist: Kirk Ruths, MD  Chart reviewed as part of pre-operative protocol coverage. Given past medical history and time since last visit, based on ACC/AHA guidelines, Tiler R Sonnie [place]amo. would be at acceptable risk for the planned procedure without further cardiovascular testing.   Patient with diagnosis of A Fib on Eliquis for anticoagulation.     Procedure: colonoscopy Date of procedure: 03/24/21   CHA2DS2-VASc Score = 2  This indicates a 2.2% annual risk of stroke. The patient's score is based upon: CHF History: No HTN History: No Diabetes History: No Stroke History: No Vascular Disease History: No Age Score: 2 Gender Score: 0    CrCl: 199m/min Plt Ct: 236K   Patient can hold Eliquis 2 days prior to procedure  The patient was advised that if he develops new symptoms prior to surgery to contact our office to arrange for a follow-up visit, and he verbalized understanding.  I will route this recommendation to the requesting party via Epic fax function and remove from pre-op pool.  Please call with questions.  JKathyrn Drown NP 03/21/2021, 7:40 AM

## 2021-03-21 NOTE — Telephone Encounter (Signed)
Patient with diagnosis of A Fib on Eliquis for anticoagulation.     Procedure: colonoscopy Date of procedure: 03/24/21     CHA2DS2-VASc Score = 2  This indicates a 2.2% annual risk of stroke. The patient's score is based upon: CHF History: No HTN History: No Diabetes History: No Stroke History: No Vascular Disease History: No Age Score: 2 Gender Score: 0   CrCl: 122m/min Plt Ct: 236K  Patient can hold Eliquis 2 days prior to procedure

## 2021-03-24 DIAGNOSIS — Z1211 Encounter for screening for malignant neoplasm of colon: Secondary | ICD-10-CM | POA: Diagnosis not present

## 2021-03-24 DIAGNOSIS — K5730 Diverticulosis of large intestine without perforation or abscess without bleeding: Secondary | ICD-10-CM | POA: Diagnosis not present

## 2021-04-21 DIAGNOSIS — E785 Hyperlipidemia, unspecified: Secondary | ICD-10-CM | POA: Diagnosis not present

## 2021-04-21 DIAGNOSIS — Z125 Encounter for screening for malignant neoplasm of prostate: Secondary | ICD-10-CM | POA: Diagnosis not present

## 2021-04-28 DIAGNOSIS — R2689 Other abnormalities of gait and mobility: Secondary | ICD-10-CM | POA: Diagnosis not present

## 2021-04-28 DIAGNOSIS — I480 Paroxysmal atrial fibrillation: Secondary | ICD-10-CM | POA: Diagnosis not present

## 2021-04-28 DIAGNOSIS — R42 Dizziness and giddiness: Secondary | ICD-10-CM | POA: Diagnosis not present

## 2021-04-28 DIAGNOSIS — Z0000 Encounter for general adult medical examination without abnormal findings: Secondary | ICD-10-CM | POA: Diagnosis not present

## 2021-04-28 DIAGNOSIS — R82998 Other abnormal findings in urine: Secondary | ICD-10-CM | POA: Diagnosis not present

## 2021-04-28 DIAGNOSIS — R490 Dysphonia: Secondary | ICD-10-CM | POA: Diagnosis not present

## 2021-04-28 DIAGNOSIS — M1990 Unspecified osteoarthritis, unspecified site: Secondary | ICD-10-CM | POA: Diagnosis not present

## 2021-04-28 DIAGNOSIS — G4733 Obstructive sleep apnea (adult) (pediatric): Secondary | ICD-10-CM | POA: Diagnosis not present

## 2021-04-28 DIAGNOSIS — Z23 Encounter for immunization: Secondary | ICD-10-CM | POA: Diagnosis not present

## 2021-04-28 DIAGNOSIS — Z1212 Encounter for screening for malignant neoplasm of rectum: Secondary | ICD-10-CM | POA: Diagnosis not present

## 2021-04-28 DIAGNOSIS — N401 Enlarged prostate with lower urinary tract symptoms: Secondary | ICD-10-CM | POA: Diagnosis not present

## 2021-04-28 DIAGNOSIS — Z7901 Long term (current) use of anticoagulants: Secondary | ICD-10-CM | POA: Diagnosis not present

## 2021-04-28 DIAGNOSIS — E785 Hyperlipidemia, unspecified: Secondary | ICD-10-CM | POA: Diagnosis not present

## 2021-04-28 DIAGNOSIS — G629 Polyneuropathy, unspecified: Secondary | ICD-10-CM | POA: Diagnosis not present

## 2021-05-08 ENCOUNTER — Ambulatory Visit (INDEPENDENT_AMBULATORY_CARE_PROVIDER_SITE_OTHER): Payer: Medicare Other | Admitting: Sports Medicine

## 2021-05-08 VITALS — BP 104/60 | Ht 76.0 in | Wt 216.0 lb

## 2021-05-08 DIAGNOSIS — G603 Idiopathic progressive neuropathy: Secondary | ICD-10-CM | POA: Diagnosis not present

## 2021-05-08 MED ORDER — GABAPENTIN 300 MG PO CAPS: ORAL_CAPSULE | ORAL | 3 refills | Status: DC

## 2021-05-08 NOTE — Patient Instructions (Signed)
Thank you for coming to see me today. It was a pleasure. Today we talked about:   - Increase your gabapentin to two tablets at your bedtime dose.  - We will send you to neuro rehab for a consultation for your balance.  - Add side spine stretches on each side every day and continue your otherwise balance exercises and stretching.  Please follow-up with [place] in 6-8 weeks.  If you have any questions or concerns, please do not hesitate to call the office at [phone].  Best,   Ari[place] Constable, DO [place]

## 2021-05-08 NOTE — Assessment & Plan Note (Signed)
Will increase his nighttime gabapentin dose to 600mg  and continue other doses at 300mg .  Will also refer to neuro rehab for consultation given his balance issues.  He has developed lumbar scoliosis as well, therefore will add bilateral side spine stretch and have him continue home balance exercises. F/U 6-8 weeks.

## 2021-05-08 NOTE — Progress Notes (Signed)
   Wesley [name]. is a 80 y.o. male who presents to [facility] today for the following:  Peripheral Neuropathy Has been on gabapentin 300mg  QID for quite some time Does well with this, but notices that if he is late on his dose, he feels burning pain Reports that he has been having some balance concerns and is worried that this will continue to get worse He has been thinking about moving into a retirement facility  [place] reviewed.  ROS as above. Medications reviewed.  Exam:  BP 104/60   Ht 6\' 4"  (1.93 m)   Wt 216 lb (98 kg)   BMI 26.29 kg/m  Gen: Well NAD MSK:  Bilateral Feet: Inspection:  B/L pes planus with b/l hallux valgus  Palpation: No tenderness to palpation b/l ROM: Full  ROM of the ankle b/l Strength: 5/5 strength ankle in all planes b/l Neurovascular: 2+ DP pulses b/l, sensation decreased in stocking distribution bilaterally, with hypersensitivity at plantar lateral aspect of left foot  Gait: left sided lean with occasional crossover of the foot and overall cautious gait  Lumbar Spine  Inspection: Lumbar levoscoliosis   No results found.   Assessment and Plan: 1) Peripheral neuropathy Will increase his nighttime gabapentin dose to 600mg  and continue other doses at 300mg .  Will also refer to neuro rehab for consultation given his balance issues.  He has developed lumbar scoliosis as well, therefore will add bilateral side spine stretch and have him continue home balance exercises. F/U 6-8 weeks.   [place] Constable, D.O.  PGY-4 [place]  05/08/2021 2:43 PM  I observed and examined the patient with the resident and agree with assessment and plan.  Note reviewed and modified by me. Ila Mcgill, MD

## 2021-05-13 ENCOUNTER — Ambulatory Visit: Payer: Medicare Other | Admitting: Sports Medicine

## 2021-05-16 ENCOUNTER — Ambulatory Visit: Payer: Medicare Other | Attending: Sports Medicine | Admitting: Physical Therapy

## 2021-05-16 ENCOUNTER — Other Ambulatory Visit: Payer: Self-pay

## 2021-05-16 DIAGNOSIS — M6281 Muscle weakness (generalized): Secondary | ICD-10-CM | POA: Diagnosis not present

## 2021-05-16 DIAGNOSIS — R2689 Other abnormalities of gait and mobility: Secondary | ICD-10-CM | POA: Insufficient documentation

## 2021-05-16 DIAGNOSIS — G603 Idiopathic progressive neuropathy: Secondary | ICD-10-CM | POA: Diagnosis not present

## 2021-05-16 DIAGNOSIS — R2681 Unsteadiness on feet: Secondary | ICD-10-CM | POA: Insufficient documentation

## 2021-05-16 NOTE — Therapy (Signed)
[facility] [address] [place], [place], [address] Phone: [phone]   Fax:  [phone]  Physical Therapy Evaluation  Patient Details  Name: Wesley [name]. MRN: [id number] Date of Birth: Oct 10, 1940 Referring Provider (PT): Dr. Stefanie Libel   Encounter Date: 05/16/2021   PT End of Session - 05/16/21 0939     Visit Number 1    Number of Visits 9    Date for PT Re-Evaluation 06/13/21    Authorization Type Medicare/Tricare    PT Start Time 0931    PT Stop Time 1024    PT Time Calculation (min) 53 min    Equipment Utilized During Treatment --    Activity Tolerance Patient tolerated treatment well    Behavior During Therapy WFL for tasks assessed/performed             Past Medical History:  Diagnosis Date   A-fib [facility])    cardioversion x2   Ankylosing spondylitis ([place])    Atrial flutter (HCC)    GERD (gastroesophageal reflux disease)    Hemorrhoids    Irregular heart beat    MVP (mitral valve prolapse)    WITH A MIDSYSTOLIC CLICK   OSA (obstructive sleep apnea)    moderate OSA NPSG 10/24/09 - AHO 21.8/hr   Peripheral neuropathy    Vitamin B12 deficiency     Past Surgical History:  Procedure Laterality Date   ABLATION     CARDIOVERSION N/A 12/20/2019   Procedure: CARDIOVERSION;  Surgeon: Jerline Pain, MD;  [facility];  Service: Cardiovascular;  Laterality: N/A;   COLONOSCOPY     INGUINAL HERNIA REPAIR     venous ligation     for varices left lower leg    There were no vitals filed for this visit.    Subjective Assessment - 05/16/21 0934     Subjective Feel that about 80% of my neuropathy pain is actually numbness.  This is on both legs, but started out on R side.  Balance has gotten appreciably worse, especially over the past year.    Patient Stated Goals Pt's goal for therapy is to improve balance, reduce risk of falling.    Currently in Pain? Yes    Pain Location Foot    Pain  Orientation Right;Left    Pain Descriptors / Indicators Numbness;Sharp    Pain Type Chronic pain    Pain Onset More than a month ago    Pain Frequency Intermittent    Aggravating Factors  Some positions (driving positions), later in the day    Pain Relieving Factors Gabapentin                [facility] PT Assessment - 05/16/21 0941       Assessment   Medical Diagnosis idiopathic peripheral neuropathy    Referring Provider (PT) Dr. Stefanie Libel    Onset Date/Surgical Date 05/08/21    Hand Dominance Right      Precautions   Precautions Fall      Balance Screen   Has the patient fallen in the past 6 months No    How many times? 1   > 1 year ago, tripped over raised sidewalk   Has the patient had a decrease in activity level because of a fear of falling?  No    Is the patient reluctant to leave their home because of a fear of falling?  No      Home Environment   Living  Environment Private residence    Armed forces logistics/support/administrative officer other      Prior Function   Level of [place] Retired    Leisure Enjoys working out at [employer]      Observation/Other [employer] on Therapeutic Outcomes (FOTO)  NA      Sensation   Light Touch Impaired by gross assessment    Additional Comments Per pt report, at Dr. Nona Dell office-unable to feel sharp touch on plantar aspect of feet      ROM / Strength   AROM / PROM / Strength Strength      Strength   Overall Strength Within functional limits for tasks performed    Overall Strength Comments Grossly tested 5/5 bilateral lower extremities      Transfers   Transfers Sit to Stand;Stand to Sit    Sit to Stand 6: Modified independent (Device/Increase time);From chair/3-in-1;Without upper extremity assist    Five time sit to stand comments  12.78    Stand to Sit 6: Modified independent (Device/Increase time);Without upper extremity assist;To chair/3-in-1      Ambulation/Gait   Ambulation/Gait  Yes    Ambulation/Gait Assistance 6: Modified independent (Device/Increase time)    Ambulation Distance (Feet) 50 Feet    Assistive device None    Gait Pattern Step-through pattern;Wide base of support;Poor foot clearance - left;Right flexed knee in stance;Left flexed knee in stance    Ambulation Surface Level;Indoor    Gait velocity 9.98 sec = 32.9 ft/sec      Standardized Balance Assessment   Standardized Balance Assessment Berg Balance Test;Dynamic Gait Index      Berg Balance Test   Sit to Stand Able to stand without using hands and stabilize independently    Standing Unsupported Able to stand safely 2 minutes    Sitting with Back Unsupported but Feet Supported on Floor or Stool Able to sit safely and securely 2 minutes    Stand to Sit Sits safely with minimal use of hands    Transfers Able to transfer safely, minor use of hands    Standing Unsupported with Eyes Closed Able to stand 10 seconds with supervision    Standing Unsupported with Feet Together Able to place feet together independently and stand for 1 minute with supervision    From Standing, Reach Forward with Outstretched Arm Can reach confidently >25 cm (10")    From Standing Position, Pick up Object from Floor Able to pick up shoe safely and easily    From Standing Position, Turn to Look Behind Over each Shoulder Looks behind from both sides and weight shifts well    Turn 360 Degrees Able to turn 360 degrees safely one side only in 4 seconds or less    Standing Unsupported, Alternately Place Feet on Step/Stool Able to complete >2 steps/needs minimal assist    Standing Unsupported, One Foot in Front Able to plae foot ahead of the other independently and hold 30 seconds    Standing on One Leg Tries to lift leg/unable to hold 3 seconds but remains standing independently    Total Score 46    Berg comment: Scores <45/56 indicate increased fall risk      Dynamic Gait Index   Level Surface Mild Impairment    Change in Gait  Speed Mild Impairment    Gait with Horizontal Head Turns Mild Impairment    Gait with Vertical Head Turns Mild Impairment    Gait and Pivot Turn Mild Impairment  Step Over Obstacle Mild Impairment    Step Around Obstacles Mild Impairment    Steps --   No steps available in clinic; PT forgot to ask how pt negotiates step     High Level Balance   High Level Balance Comments Tandem stance, LLE posterior 9 seconds, RLE posterior 30 seconds with increased sway.  RLE SLS 1-2 sec, LLE SLS <3 sec.  Standing solid surface able to hold balance EO and EC 30 sec; foam surface EO 30 sec and EC 6 sec with increased sway.                        Objective measurements completed on examination: See above findings.                PT Education - 05/16/21 1138     Education Details PT eval results, POC; vestibular/visual/somatosensory systems use in balance; need for use of light to improve balance and reduce fall risk in dark areas    Person(s) Educated Patient    Methods Explanation    Comprehension Verbalized understanding              PT Short Term Goals - 05/16/21 1149       PT SHORT TERM GOAL #1   Title STGs = LTGs               PT Long Term Goals - 05/16/21 1149       PT LONG TERM GOAL #1   Title Pt will be independent with HEP for improved strength, balance, transfers, and gait.  TARGET 06/13/2021    Time 4    Period Weeks    Status New      PT LONG TERM GOAL #2   Title Pt will improve 5x sit<>stand to less than or equal to 12 sec to demonstrate improved functional strength and transfer efficiency.    Baseline 12.78 sec    Time 4    Period Weeks    Status New      PT LONG TERM GOAL #3   Title Pt will improve DGI score to by at least 3 points to decrease fall risk.    Time 4    Period Weeks    Status New      PT LONG TERM GOAL #4   Title Pt will improve standing eyes closed on compliant surface, for at least 15 seconds to demonstrate  improved vestibular system use for balance.    Baseline 6 seconds    Time 4    Period Weeks    Status New      PT LONG TERM GOAL #5   Title Pt will verbalize understanding of fall prevention in home environment.    Time 4    Period Weeks    Status New                    Plan - 05/16/21 1141     Clinical Impression Statement Pt is a 80 yo male who presents to OPPT with balance difficulties due to idiopathic peripheral neuropathy.  He presents to OPPT with decreased functional strength, decreased balance, instability with gait, likely decreased vestibular system component with balance.  He is at fall risk per Merrilee Jansky and likely DGI (did not have ability to trial or ask about steps today).  He is active with exercise and outdoors and would benefit from skilled PT to improve overall balance, decrease fall risk.  Personal Factors and Comorbidities Comorbidity 3+    Comorbidities PMH:  PAF, atrial tachycardia s/p cardioversion 11/2019 and ablation 7/21, ankylosing spondylitis, GERD, MVP, OSA    Examination-Activity Limitations Locomotion Level;Transfers;Stand    Examination-Participation Restrictions Community Activity;Other   community exercise, travel   Stability/Clinical Decision Making Evolving/Moderate complexity    Clinical Decision Making Moderate    Rehab Potential Good    PT Frequency 2x / week    PT Duration 4 weeks   plus eval   PT Treatment/Interventions ADLs/Self Care Home Management;Gait training;Functional mobility training;Therapeutic activities;Therapeutic exercise;Balance training;Neuromuscular re-education;Patient/family education    PT Next Visit Plan Initiate balance exercises for vestibular system balance training (corner balance), compliant surfaces, eyes closed; work on single limb and tandem stance;  check hip MMT for hip strength component for balance; work on ankle/hip strategies    Consulted and Agree with Plan of Care Patient             Patient will  benefit from skilled therapeutic intervention in order to improve the following deficits and impairments:  Abnormal gait, Difficulty walking, Decreased balance, Decreased mobility, Decreased strength  Visit Diagnosis: Unsteadiness on feet  Other abnormalities of gait and mobility  Muscle weakness (generalized)     Problem List Patient Active Problem List   Diagnosis Date Noted   Chronic hoarseness 10/04/2020   PAF (paroxysmal atrial fibrillation) (HCC)    DDD (degenerative disc disease), lumbar 10/28/2017   Bilateral bunions 08/20/2016   Varicose veins of right lower extremity with complications 05/12/5101   Bruit 10/10/2015   Varicose veins of leg with complications 58/52/7782   Ankylosing spondylitis ([place]) 01/14/2015   Chronic anticoagulation 01/14/2015   Sleep apnea 01/14/2015   S/P ablation of atrial fibrillation 01/08/2015   Persistent atrial fibrillation ([place]) 01/08/2015   Orthostatic dizziness 12/01/2013   Calf pain 11/22/2012   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/22/2012   Peripheral neuropathy 10/21/2012   Pain in joint, ankle and foot 09/21/2012   Disturbance of skin sensation 09/21/2012   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 ([place]) 09/21/2012   Other general symptoms(780.99) 09/21/2012   Heel pain 06/07/2012   Plantar fasciitis 06/07/2012   Mitral valve prolapse 06/03/2011   Arthritis pain of shoulder 04/03/2011   Rotator cuff syndrome of right shoulder 04/03/2011   Paroxysmal atrial fibrillation ([place]) 01/20/2011   Hypercholesterolemia 01/20/2011   OBSTRUCTIVE SLEEP APNEA 10/08/2009    Domnic Vantol W., PT 05/16/2021, 12:05 PM  [facility] [address], [place], [place], [address] Phone: [phone]   Fax:  [phone]  Name: Wesley [name]. MRN: [id number] Date of Birth: 02-24-1941

## 2021-05-20 ENCOUNTER — Ambulatory Visit: Payer: Self-pay | Admitting: Physical Therapy

## 2021-05-20 DIAGNOSIS — H353212 Exudative age-related macular degeneration, right eye, with inactive choroidal neovascularization: Secondary | ICD-10-CM | POA: Diagnosis not present

## 2021-05-20 DIAGNOSIS — H35432 Paving stone degeneration of retina, left eye: Secondary | ICD-10-CM | POA: Diagnosis not present

## 2021-05-20 DIAGNOSIS — H353122 Nonexudative age-related macular degeneration, left eye, intermediate dry stage: Secondary | ICD-10-CM | POA: Diagnosis not present

## 2021-05-20 DIAGNOSIS — H43813 Vitreous degeneration, bilateral: Secondary | ICD-10-CM | POA: Diagnosis not present

## 2021-05-21 ENCOUNTER — Encounter: Payer: Self-pay | Admitting: Physical Therapy

## 2021-05-21 ENCOUNTER — Other Ambulatory Visit: Payer: Self-pay

## 2021-05-21 ENCOUNTER — Ambulatory Visit: Payer: Medicare Other | Admitting: Physical Therapy

## 2021-05-21 DIAGNOSIS — R2681 Unsteadiness on feet: Secondary | ICD-10-CM | POA: Diagnosis not present

## 2021-05-21 DIAGNOSIS — G603 Idiopathic progressive neuropathy: Secondary | ICD-10-CM | POA: Diagnosis not present

## 2021-05-21 DIAGNOSIS — M6281 Muscle weakness (generalized): Secondary | ICD-10-CM

## 2021-05-21 DIAGNOSIS — R2689 Other abnormalities of gait and mobility: Secondary | ICD-10-CM | POA: Diagnosis not present

## 2021-05-21 NOTE — Patient Instructions (Signed)
Provided copy of handout for corner balance exercises on compliant surface:   Feet apart, eyes open, head turns/nods x 5-10 reps Feet apart, eyes closed, 15 sec hold (progress to EC head turns/nods x 5-10 reps) Feet together, eyes open, head turns/nods x 5-10 reps Feet together, eyes closed 15 sec hold (progress to EC head turns/nods x 5-10 reps)  Perform once per day.  Perform in corner with chair for UE support as needed.

## 2021-05-21 NOTE — Therapy (Signed)
[facility] [address] [place], [place], [address] Phone: [phone]   Fax:  [phone]  Physical Therapy Treatment  Patient Details  Name: Wesley [name]. MRN: [id number] Date of Birth: Jun 03, 1941 Referring Provider (PT): Dr. Stefanie Libel   Encounter Date: 05/21/2021   PT End of Session - 05/21/21 1310     Visit Number 2    Number of Visits 9    Date for PT Re-Evaluation 06/13/21    Authorization Type Medicare/Tricare    PT Start Time 1311    PT Stop Time 1359    PT Time Calculation (min) 48 min    Activity Tolerance Patient tolerated treatment well    Behavior During Therapy WFL for tasks assessed/performed             Past Medical History:  Diagnosis Date   A-fib [facility])    cardioversion x2   Ankylosing spondylitis ([place])    Atrial flutter (HCC)    GERD (gastroesophageal reflux disease)    Hemorrhoids    Irregular heart beat    MVP (mitral valve prolapse)    WITH A MIDSYSTOLIC CLICK   OSA (obstructive sleep apnea)    moderate OSA NPSG 10/24/09 - AHO 21.8/hr   Peripheral neuropathy    Vitamin B12 deficiency     Past Surgical History:  Procedure Laterality Date   ABLATION     CARDIOVERSION N/A 12/20/2019   Procedure: CARDIOVERSION;  Surgeon: Jerline Pain, MD;  Location: [place];  Service: Cardiovascular;  Laterality: N/A;   COLONOSCOPY     INGUINAL HERNIA REPAIR     venous ligation     for varices left lower leg    There were no vitals filed for this visit.   Subjective Assessment - 05/21/21 1310     Subjective My back has gone out on me and my R thumb joint is bothering me.  They both are getting a little better.  Uses rail and step over step pattern for stair negotiation.    Patient Stated Goals Pt's goal for therapy is to improve balance, reduce risk of falling.    Currently in Pain? Yes    Pain Score 5     Pain Location Back    Pain Orientation Right;Mid;Lower    Pain Descriptors /  Indicators Aching    Pain Type Acute pain    Pain Onset More than a month ago    Pain Frequency Intermittent    Aggravating Factors  bending, later at night    Pain Relieving Factors stretches, medication, heating pad    Multiple Pain Sites Yes    Pain Score 5    Pain Location Finger (Comment which one)   thumb   Pain Orientation Right    Pain Descriptors / Indicators Sharp    Pain Type Acute pain    Pain Onset In the past 7 days    Pain Frequency Intermittent    Aggravating Factors  unsure    Pain Relieving Factors getting better in the past few days    Pain Score --   No pain in Bil[place] feet today.  Just numbness               [facility] PT Assessment - 05/21/21 0001       Dynamic Gait Index   Steps Mild Impairment   per report, uses handrail, step over step   DGI comment: Total score 16/24 (scores <19/24 indicate increased fall risk)  [place] [facility] PT Treatment/Exercise - 05/21/21 0001       Ambulation/Gait   Ambulation/Gait Yes    Ambulation/Gait Assistance 6: Modified independent (Device/Increase time)    Ambulation Distance (Feet) 60 Feet   x 2   Assistive device None    Gait Pattern Step-through pattern;Wide base of support;Right flexed knee in stance;Left flexed knee in stance    Ambulation Surface Level;Indoor    Gait Comments Initial cues for exagerrated foot clearance/heelstrike, pt with improved foot clearance with attention to heelstrike by end of session.      Exercises   Exercises Other Exercises    Other Exercises  MMT:  Hip abduction LLE 3+/5, RLE 4/5                 Balance Exercises - 05/21/21 0001       Balance Exercises: Standing   Standing Eyes Opened Wide (BOA);Narrow base of support (BOS);Head turns;Solid surface;Limitations;Foam/compliant surface    Standing Eyes Opened Limitations Head turns/nods x 5 reps each    Standing Eyes Closed Narrow base of support (BOS);Wide (BOA);Limitations;Solid  surface;Foam/compliant surface    Standing Eyes Closed Limitations Head turns/nods x 5 reps.  Also stationary head position, 10 seconds, 2 reps each position.  Intermittent UE support at chair for stability.    SLS with Vectors Solid surface;Upper extremity assist 2;Upper extremity assist 1;Other reps (comment);Limitations    SLS with Vectors Limitations step taps to aerobic step, 10 reps BUE support, 10 reps 1 UE support, 10 reps no support; occasional cues to look ahead and lessen forward posture to look down at feet.    Retro Gait 4 reps;Limitations    Retro Gait Limitations Forward/back in parallel bars, cues for heelstrike    Heel Raises Both;10 reps    Toe Raise Both;10 reps    Other Standing Exercises Hip strategy work, 10 reps at parallel bars    Other Standing Exercises Comments Stagger stance forward/back rocking for dynamic gastroc stretch and active dorsiflexion                PT Education - 05/21/21 1511     Education Details Initial HEP-see instructions    Person(s) Educated Patient    Methods Explanation;Demonstration;Handout    Comprehension Verbalized understanding;Returned demonstration              PT Short Term Goals - 05/16/21 1149       PT SHORT TERM GOAL #1   Title STGs = LTGs               PT Long Term Goals - 05/21/21 1517       PT LONG TERM GOAL #1   Title Pt will be independent with HEP for improved strength, balance, transfers, and gait.  TARGET 06/13/2021    Time 4    Period Weeks    Status New      PT LONG TERM GOAL #2   Title Pt will improve 5x sit<>stand to less than or equal to 12 sec to demonstrate improved functional strength and transfer efficiency.    Baseline 12.78 sec    Time 4    Period Weeks    Status New      PT LONG TERM GOAL #3   Title Pt will improve DGI score to by at least 3 points to decrease fall risk.    Baseline 16/24    Time 4    Period Weeks    Status New  PT LONG TERM GOAL #4   Title Pt will  improve standing eyes closed on compliant surface, for at least 15 seconds to demonstrate improved vestibular system use for balance.    Baseline 6 seconds    Time 4    Period Weeks    Status New      PT LONG TERM GOAL #5   Title Pt will verbalize understanding of fall prevention in home environment.    Time 4    Period Weeks    Status New                   Plan - 05/21/21 1319     Clinical Impression Statement Initiated HEP to address compliant surface/vision removed exercises for improved vestibular system use for balance.  Also worked on hip/ankle strategies and attention to improved heelstrike for improved foot clearance.  With MMT, pt demonstrates decreased hip abductor weakness, L > R.  Will plan to address hip strengthening as part of improved balance and stability work.    Personal Factors and Comorbidities Comorbidity 3+    Comorbidities PMH:  PAF, atrial tachycardia s/p cardioversion 11/2019 and ablation 7/21, ankylosing spondylitis, GERD, MVP, OSA    Examination-Activity Limitations Locomotion Level;Transfers;Stand    Examination-Participation Restrictions Community Activity;Other   community exercise, travel   Stability/Clinical Decision Making Evolving/Moderate complexity    Rehab Potential Good    PT Frequency 2x / week    PT Duration 4 weeks   plus eval   PT Treatment/Interventions ADLs/Self Care Home Management;Gait training;Functional mobility training;Therapeutic activities;Therapeutic exercise;Balance training;Neuromuscular re-education;Patient/family education    PT Next Visit Plan IReview initial HEP; continue vestibular system balance training (corner balance), compliant surfaces, eyes closed; work on single limb and tandem stance;  work on hip strength component for balance; work on ankle/hip strategies    Consulted and Agree with Plan of Care Patient             Patient will benefit from skilled therapeutic intervention in order to improve the  following deficits and impairments:  Abnormal gait, Difficulty walking, Decreased balance, Decreased mobility, Decreased strength  Visit Diagnosis: Unsteadiness on feet  Other abnormalities of gait and mobility  Muscle weakness (generalized)     Problem List Patient Active Problem List   Diagnosis Date Noted   Chronic hoarseness 10/04/2020   PAF (paroxysmal atrial fibrillation) (HCC)    DDD (degenerative disc disease), lumbar 10/28/2017   Bilateral bunions 08/20/2016   Varicose veins of right lower extremity with complications 53/97/6734   Bruit 10/10/2015   Varicose veins of leg with complications 19/37/9024   Ankylosing spondylitis ([place]) 01/14/2015   Chronic anticoagulation 01/14/2015   Sleep apnea 01/14/2015   S/P ablation of atrial fibrillation 01/08/2015   Persistent atrial fibrillation ([place]) 01/08/2015   Orthostatic dizziness 12/01/2013   Calf pain 11/22/2012   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/22/2012   Peripheral neuropathy 10/21/2012   Pain in joint, ankle and foot 09/21/2012   Disturbance of skin sensation 09/21/2012   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 ([place]) 09/21/2012   Other general symptoms(780.99) 09/21/2012   Heel pain 06/07/2012   Plantar fasciitis 06/07/2012   Mitral valve prolapse 06/03/2011   Arthritis pain of shoulder 04/03/2011   Rotator cuff syndrome of right shoulder 04/03/2011   Paroxysmal atrial fibrillation ([place]) 01/20/2011   Hypercholesterolemia 01/20/2011   OBSTRUCTIVE SLEEP APNEA 10/08/2009    Nealy Karapetian W., PT 05/21/2021, 3:20 PM  [facility] [address]  [place], [place], [address] Phone: [phone]   Fax:  [phone]  Name: Wesley [name]. MRN: [id number] Date of Birth: 1941-07-16

## 2021-05-22 ENCOUNTER — Ambulatory Visit: Payer: Medicare Other | Admitting: Physical Therapy

## 2021-05-22 ENCOUNTER — Encounter: Payer: Self-pay | Admitting: Physical Therapy

## 2021-05-22 DIAGNOSIS — G603 Idiopathic progressive neuropathy: Secondary | ICD-10-CM | POA: Diagnosis not present

## 2021-05-22 DIAGNOSIS — R2681 Unsteadiness on feet: Secondary | ICD-10-CM | POA: Diagnosis not present

## 2021-05-22 DIAGNOSIS — R2689 Other abnormalities of gait and mobility: Secondary | ICD-10-CM

## 2021-05-22 DIAGNOSIS — M6281 Muscle weakness (generalized): Secondary | ICD-10-CM | POA: Diagnosis not present

## 2021-05-22 NOTE — Patient Instructions (Signed)
Access Code: [email or web address] URL: [email or web address] Date: 05/22/2021 Prepared by: Mady Haagensen  Exercises Standing on Foam Pad - 1 x daily - 5 x weekly - 1 sets - 10 reps -EO/EC head turns/nods  Romberg Stance on Foam Pad - 1 x daily - 5 x weekly - 1 sets - 10 reps -EO/EC head turns/nods

## 2021-05-22 NOTE — Therapy (Signed)
[facility] [address] [place], [place], [address] Phone: [phone]   Fax:  [phone]  Physical Therapy Treatment  Patient Details  Name: Wesley [name]. MRN: [id number] Date of Birth: 1941-07-25 Referring Provider (PT): Dr. Stefanie Libel   Encounter Date: 05/22/2021   PT End of Session - 05/22/21 1059     Visit Number 3    Number of Visits 9    Date for PT Re-Evaluation 06/13/21    Authorization Type Medicare/Tricare    PT Start Time 1059    PT Stop Time 1144    PT Time Calculation (min) 45 min    Activity Tolerance Patient tolerated treatment well    Behavior During Therapy WFL for tasks assessed/performed             Past Medical History:  Diagnosis Date   A-fib ([place])    cardioversion x2   Ankylosing spondylitis ([place])    Atrial flutter ([facility])    GERD (gastroesophageal reflux disease)    Hemorrhoids    Irregular heart beat    MVP (mitral valve prolapse)    WITH A MIDSYSTOLIC CLICK   OSA (obstructive sleep apnea)    moderate OSA NPSG 10/24/09 - AHO 21.8/hr   Peripheral neuropathy    Vitamin B12 deficiency     Past Surgical History:  Procedure Laterality Date   ABLATION     CARDIOVERSION N/A 12/20/2019   Procedure: CARDIOVERSION;  Surgeon: Jerline Pain, MD;  Location: [facility];  Service: Cardiovascular;  Laterality: N/A;   COLONOSCOPY     INGUINAL HERNIA REPAIR     venous ligation     for varices left lower leg    There were no vitals filed for this visit.   Subjective Assessment - 05/22/21 1100     Subjective Already been to work out this morning.  Back is about the same and thumb is feeling better.  Attention to picking up feet/striking heels with gait makes a lot of sense.    Patient Stated Goals Pt's goal for therapy is to improve balance, reduce risk of falling.    Currently in Pain? Yes    Pain Score 5     Pain Location Back    Pain Orientation Right;Lower;Mid    Pain Descriptors /  Indicators Aching    Pain Type Acute pain    Pain Onset More than a month ago    Aggravating Factors  bending, later at night    Pain Relieving Factors stretches, medication, heating pad    Multiple Pain Sites Yes    Pain Score 1    Pain Location Finger (Comment which one)   thumb   Pain Orientation Right    Pain Descriptors / Indicators Sharp    Pain Type Acute pain    Pain Onset In the past 7 days    Pain Frequency Intermittent    Aggravating Factors  unsure    Pain Relieving Factors gradually getting better                               [facility] Adult PT Treatment/Exercise - 05/22/21 1246       Ambulation/Gait   Ambulation/Gait Yes    Ambulation/Gait Assistance 6: Modified independent (Device/Increase time)    Ambulation Distance (Feet) 60 Feet   x6 with attention to widened BOS, heel strike, with cues relaxed arm swing   Assistive device  None    Gait Pattern Step-through pattern;Right flexed knee in stance;Left flexed knee in stance    Ambulation Surface Level;Indoor    Gait Comments Additional 40 ft x 2 gait with head turns                 Balance Exercises - 05/22/21 1104       Balance Exercises: Standing   Standing Eyes Opened Wide (BOA);Narrow base of support (BOS);Head turns;Solid surface;Limitations;Foam/compliant surface    Standing Eyes Opened Limitations Head turns/nods x 10 reps each    Standing Eyes Closed Narrow base of support (BOS);Wide (BOA);Limitations;Solid surface;Foam/compliant surface    Standing Eyes Closed Limitations Head turns/nods x 10 reps.  Intermittent UE support at parallel bars for stability.    Tandem Stance Eyes open;Eyes closed;Foam/compliant surface;Intermittent upper extremity support;Limitations    Tandem Stance Time Head turns, head nods EO x 10 reps; EC head steady 15 sec, each foot position, intermittent UE support    Stepping Strategy Anterior;Posterior;Lateral;Foam/compliant surface;UE support;10  reps;Limitations    Stepping Strategy Limitations Cues for improved foot clearance with return to Airex cushion from forward step    Rockerboard Anterior/posterior;Lateral;EO;Intermittent UE support;Limitations    Rockerboard Limitations Hip/ankle strategy work, then maintaining board in midline with arms by sides, then with arm swing.  Min guard/supervision provided throughout.    Retro Gait Limitations;5 reps    Retro Gait Limitations Forward/back in parallel bars, attention to heelstrike and widened BOS    Heel Raises Both;10 reps   on Airex   Toe Raise Both;10 reps   on Airex   Other Standing Exercises Hip strategy work, 10 reps at parallel bars, standing on Airex    Other Standing Exercises Comments Stagger stance forward/back rocking for dynamic gastroc stretch and active dorsiflexion                PT Education - 05/22/21 1253     Education Details provided MedBridge HEP instructions (same exercises as yesterday, just updated)    Person(s) Educated Patient    Methods Explanation;Demonstration;Handout    Comprehension Verbalized understanding;Returned demonstration              PT Short Term Goals - 05/16/21 1149       PT SHORT TERM GOAL #1   Title STGs = LTGs               PT Long Term Goals - 05/21/21 1517       PT LONG TERM GOAL #1   Title Pt will be independent with HEP for improved strength, balance, transfers, and gait.  TARGET 06/13/2021    Time 4    Period Weeks    Status New      PT LONG TERM GOAL #2   Title Pt will improve 5x sit<>stand to less than or equal to 12 sec to demonstrate improved functional strength and transfer efficiency.    Baseline 12.78 sec    Time 4    Period Weeks    Status New      PT LONG TERM GOAL #3   Title Pt will improve DGI score to by at least 3 points to decrease fall risk.    Baseline 16/24    Time 4    Period Weeks    Status New      PT LONG TERM GOAL #4   Title Pt will improve standing eyes closed on  compliant surface, for at least 15 seconds to demonstrate improved vestibular system use for  balance.    Baseline 6 seconds    Time 4    Period Weeks    Status New      PT LONG TERM GOAL #5   Title Pt will verbalize understanding of fall prevention in home environment.    Time 4    Period Weeks    Status New                   Plan - 05/22/21 1255     Clinical Impression Statement Reviewed HEP with pt return demo understanding.  Pt demonstrates and reports improved attention to cues that we have been addressing in therapy sessions, improved foot clearance and lessened narrow BOS with gait.  Pt continues to benefit from skilled PT to further address balance and gait to improve balance, decrease fall risk.    Personal Factors and Comorbidities Comorbidity 3+    Comorbidities PMH:  PAF, atrial tachycardia s/p cardioversion 11/2019 and ablation 7/21, ankylosing spondylitis, GERD, MVP, OSA    Examination-Activity Limitations Locomotion Level;Transfers;Stand    Examination-Participation Restrictions Community Activity;Other   community exercise, travel   Stability/Clinical Decision Making Evolving/Moderate complexity    Rehab Potential Good    PT Frequency 2x / week    PT Duration 4 weeks   plus eval   PT Treatment/Interventions ADLs/Self Care Home Management;Gait training;Functional mobility training;Therapeutic activities;Therapeutic exercise;Balance training;Neuromuscular re-education;Patient/family education    PT Next Visit Plan Continue vestibular system balance training (corner balance), compliant surfaces, eyes closed; work on single limb and tandem stance;  work on hip strength component for balance; work on ankle/hip strategies; Turning strategies with gait    Consulted and Agree with Plan of Care Patient             Patient will benefit from skilled therapeutic intervention in order to improve the following deficits and impairments:  Abnormal gait, Difficulty walking,  Decreased balance, Decreased mobility, Decreased strength  Visit Diagnosis: Unsteadiness on feet  Other abnormalities of gait and mobility     Problem List Patient Active Problem List   Diagnosis Date Noted   Chronic hoarseness 10/04/2020   PAF (paroxysmal atrial fibrillation) (HCC)    DDD (degenerative disc disease), lumbar 10/28/2017   Bilateral bunions 08/20/2016   Varicose veins of right lower extremity with complications 26/37/8588   Bruit 10/10/2015   Varicose veins of leg with complications 50/27/7412   Ankylosing spondylitis ([place]) 01/14/2015   Chronic anticoagulation 01/14/2015   Sleep apnea 01/14/2015   S/P ablation of atrial fibrillation 01/08/2015   Persistent atrial fibrillation ([place]) 01/08/2015   Orthostatic dizziness 12/01/2013   Calf pain 11/22/2012   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/22/2012   Peripheral neuropathy 10/21/2012   Pain in joint, ankle and foot 09/21/2012   Disturbance of skin sensation 09/21/2012   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 ([place]) 09/21/2012   Other general symptoms(780.99) 09/21/2012   Heel pain 06/07/2012   Plantar fasciitis 06/07/2012   Mitral valve prolapse 06/03/2011   Arthritis pain of shoulder 04/03/2011   Rotator cuff syndrome of right shoulder 04/03/2011   Paroxysmal atrial fibrillation ([place]) 01/20/2011   Hypercholesterolemia 01/20/2011   OBSTRUCTIVE SLEEP APNEA 10/08/2009    Aashna Matson W., PT 05/22/2021, 1:02 PM  [facility] [address] [place], [place], [address] Phone: [phone]   Fax:  [phone]  Name: Wesley Chris. MRN: [id number] Date of Birth: 1941-06-01

## 2021-05-26 ENCOUNTER — Encounter: Payer: Self-pay | Admitting: Physical Therapy

## 2021-05-26 ENCOUNTER — Ambulatory Visit: Payer: Medicare Other | Admitting: Physical Therapy

## 2021-05-26 ENCOUNTER — Other Ambulatory Visit: Payer: Self-pay

## 2021-05-26 DIAGNOSIS — M6281 Muscle weakness (generalized): Secondary | ICD-10-CM | POA: Diagnosis not present

## 2021-05-26 DIAGNOSIS — R2681 Unsteadiness on feet: Secondary | ICD-10-CM

## 2021-05-26 DIAGNOSIS — R2689 Other abnormalities of gait and mobility: Secondary | ICD-10-CM | POA: Diagnosis not present

## 2021-05-26 DIAGNOSIS — G603 Idiopathic progressive neuropathy: Secondary | ICD-10-CM | POA: Diagnosis not present

## 2021-05-26 NOTE — Therapy (Signed)
[facility] [address] [place], [place], [address] Phone: [phone]   Fax:  [phone]  Physical Therapy Treatment  Patient Details  Name: Wesley [name]. MRN: [id number] Date of Birth: 1941/06/12 Referring Provider (PT): Dr. Stefanie Libel   Encounter Date: 05/26/2021   PT End of Session - 05/26/21 1402     Visit Number 4    Number of Visits 9    Date for PT Re-Evaluation 06/13/21    Authorization Type Medicare/Tricare    PT Start Time 1402    PT Stop Time 1448    PT Time Calculation (min) 46 min    Activity Tolerance Patient tolerated treatment well    Behavior During Therapy WFL for tasks assessed/performed             Past Medical History:  Diagnosis Date   A-fib ([place])    cardioversion x2   Ankylosing spondylitis ([place])    Atrial flutter ([facility])    GERD (gastroesophageal reflux disease)    Hemorrhoids    Irregular heart beat    MVP (mitral valve prolapse)    WITH A MIDSYSTOLIC CLICK   OSA (obstructive sleep apnea)    moderate OSA NPSG 10/24/09 - AHO 21.8/hr   Peripheral neuropathy    Vitamin B12 deficiency     Past Surgical History:  Procedure Laterality Date   ABLATION     CARDIOVERSION N/A 12/20/2019   Procedure: CARDIOVERSION;  Surgeon: Jerline Pain, MD;  Location: [facility];  Service: Cardiovascular;  Laterality: N/A;   COLONOSCOPY     INGUINAL HERNIA REPAIR     venous ligation     for varices left lower leg    There were no vitals filed for this visit.   Subjective Assessment - 05/26/21 1404     Subjective Have had a lot of work going on at my home.  Been doing the exercises.    Patient Stated Goals Pt's goal for therapy is to improve balance, reduce risk of falling.    Currently in Pain? Yes    Pain Score 2     Pain Location --   Thumb, shoulder, low back ( inherent pain)   Pain Onset More than a month ago    Pain Onset --                               [facility]  Adult PT Treatment/Exercise - 05/26/21 0001       Ambulation/Gait   Ambulation/Gait Yes    Ambulation/Gait Assistance 6: Modified independent (Device/Increase time)    Ambulation Distance (Feet) 60 Feet   x 2-3 reps   Assistive device None    Gait Pattern Step-through pattern;Right flexed knee in stance;Left flexed knee in stance    Ambulation Surface Level;Indoor    Gait Comments Cues to look ahead at eye level or about 10 ft ahead on ground to allow for visual target with straight line gait and turns      High Level Balance   High Level Balance Activities Figure 8 turns;Other (comment)    High Level Balance Comments Worked on various turning methods:  figure-8 turns around [employer] and obstacles for wider turns, with varied cues (wider BOS, heelstrike, smaller step for inside foot); progressed to more stationary position>quarter turn R or L to initiate gait.  Short distance gait and gait around clinic with quick turns/changes of direction with  cues for maintaining wide BOS and avoiding crossing feet with turns.  Occasionally LLE scissors across midline during turn/change of direction.                 Balance Exercises - 05/26/21 0001       Balance Exercises: Standing   Standing Eyes Opened Narrow base of support (BOS);Head turns;Limitations;Foam/compliant surface    Standing Eyes Opened Limitations Head turns/nods x 10 reps each    Standing Eyes Closed Narrow base of support (BOS);Limitations;Foam/compliant surface    Standing Eyes Closed Limitations Head turns/nods x 10 reps.  Intermittent UE support at parallel bars for stability.    Tandem Stance Eyes open;Eyes closed;Foam/compliant surface;Intermittent upper extremity support;Limitations    Tandem Stance Time Head turns, head nods EO x 10 reps; EC head steady 15 sec, each foot position, intermittent UE support.  On Airex cushion, so not full tandem, more heel/toe stance position.    SLS Eyes open;Foam/compliant  surface;Upper extremity support 2;Intermittent upper extremity support;Limitations    SLS Limitations Standing on Airex:  foot propped on BOSU, head turns x 5; head nods x 5; arm swing x 5 (each foot position)    SLS with Vectors Foam/compliant surface;Upper extremity assist 2;Intermittent upper extremity assist;Limitations    SLS with Vectors Limitations Standing on Airex:  alt forward kicks x 10.  Alternating forward taps to BOSU x 10 reps    Stepping Strategy Anterior;Posterior;Lateral;Foam/compliant surface;UE support;10 reps;Limitations    Stepping Strategy Limitations Initial cues for improved foot clearance    Other Standing Exercises Standing on Airex with forward>back step and weightshift x 10 reps each leg; pt does nice job with foot clearance/step length.                PT Education - 05/26/21 1717     Education Details various turning methods practiced in session today-pt responds well to widened BOS, heelstrike/foot clearance for better turns    Person(s) Educated Patient    Methods Explanation;Demonstration;Verbal cues    Comprehension Verbalized understanding;Returned demonstration              PT Short Term Goals - 05/16/21 1149       PT SHORT TERM GOAL #1   Title STGs = LTGs               PT Long Term Goals - 05/21/21 1517       PT LONG TERM GOAL #1   Title Pt will be independent with HEP for improved strength, balance, transfers, and gait.  TARGET 06/13/2021    Time 4    Period Weeks    Status New      PT LONG TERM GOAL #2   Title Pt will improve 5x sit<>stand to less than or equal to 12 sec to demonstrate improved functional strength and transfer efficiency.    Baseline 12.78 sec    Time 4    Period Weeks    Status New      PT LONG TERM GOAL #3   Title Pt will improve DGI score to by at least 3 points to decrease fall risk.    Baseline 16/24    Time 4    Period Weeks    Status New      PT LONG TERM GOAL #4   Title Pt will improve  standing eyes closed on compliant surface, for at least 15 seconds to demonstrate improved vestibular system use for balance.    Baseline 6 seconds  Time 4    Period Weeks    Status New      PT LONG TERM GOAL #5   Title Pt will verbalize understanding of fall prevention in home environment.    Time 4    Period Weeks    Status New                   Plan - 05/26/21 1719     Clinical Impression Statement Continued work on compliant surface with head motions/vision removed.  Also continued work on turning methods, with pt responding well to wider BOS and heelstrike for foot clearance.  Attempted other methods, but these seem the most natural.  Will continue to benefit from work with visual targets with gait/turns and gait with head motions.    Personal Factors and Comorbidities Comorbidity 3+    Comorbidities PMH:  PAF, atrial tachycardia s/p cardioversion 11/2019 and ablation 7/21, ankylosing spondylitis, GERD, MVP, OSA    Examination-Activity Limitations Locomotion Level;Transfers;Stand    Examination-Participation Restrictions Community Activity;Other   community exercise, travel   Stability/Clinical Decision Making Evolving/Moderate complexity    Rehab Potential Good    PT Frequency 2x / week    PT Duration 4 weeks   plus eval   PT Treatment/Interventions ADLs/Self Care Home Management;Gait training;Functional mobility training;Therapeutic activities;Therapeutic exercise;Balance training;Neuromuscular re-education;Patient/family education    PT Next Visit Plan Continue vestibular system balance training (corner balance progression), compliant surfaces, eyes closed; work on single limb and tandem stance;  progress to gait with head turns/head nods.  Use of visual target for straight line gait and head turns>body turns with turning technique.  Continued turning practice with gait.    Consulted and Agree with Plan of Care Patient             Patient will benefit from skilled  therapeutic intervention in order to improve the following deficits and impairments:  Abnormal gait, Difficulty walking, Decreased balance, Decreased mobility, Decreased strength  Visit Diagnosis: Unsteadiness on feet  Other abnormalities of gait and mobility     Problem List Patient Active Problem List   Diagnosis Date Noted   Chronic hoarseness 10/04/2020   PAF (paroxysmal atrial fibrillation) (HCC)    DDD (degenerative disc disease), lumbar 10/28/2017   Bilateral bunions 08/20/2016   Varicose veins of right lower extremity with complications 02/77/4128   Bruit 10/10/2015   Varicose veins of leg with complications 78/67/6720   Ankylosing spondylitis ([place]) 01/14/2015   Chronic anticoagulation 01/14/2015   Sleep apnea 01/14/2015   S/P ablation of atrial fibrillation 01/08/2015   Persistent atrial fibrillation ([place]) 01/08/2015   Orthostatic dizziness 12/01/2013   Calf pain 11/22/2012   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/22/2012   Peripheral neuropathy 10/21/2012   Pain in joint, ankle and foot 09/21/2012   Disturbance of skin sensation 09/21/2012   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 ([place]) 09/21/2012   Other general symptoms(780.99) 09/21/2012   Heel pain 06/07/2012   Plantar fasciitis 06/07/2012   Mitral valve prolapse 06/03/2011   Arthritis pain of shoulder 04/03/2011   Rotator cuff syndrome of right shoulder 04/03/2011   Paroxysmal atrial fibrillation ([place]) 01/20/2011   Hypercholesterolemia 01/20/2011   OBSTRUCTIVE SLEEP APNEA 10/08/2009    Noach Calvillo W., PT 05/26/2021, 5:24 PM  [facility] [address], [place], [place], [address] Phone: [phone]   Fax:  [phone]  Name: Wesley Chris. MRN: [id number] Date of Birth: [birth year]

## 2021-05-28 ENCOUNTER — Encounter: Payer: Self-pay | Admitting: Physical Therapy

## 2021-05-28 ENCOUNTER — Other Ambulatory Visit: Payer: Self-pay

## 2021-05-28 ENCOUNTER — Ambulatory Visit: Payer: Medicare Other | Attending: Sports Medicine | Admitting: Physical Therapy

## 2021-05-28 DIAGNOSIS — L570 Actinic keratosis: Secondary | ICD-10-CM | POA: Diagnosis not present

## 2021-05-28 DIAGNOSIS — R2689 Other abnormalities of gait and mobility: Secondary | ICD-10-CM | POA: Diagnosis not present

## 2021-05-28 DIAGNOSIS — L814 Other melanin hyperpigmentation: Secondary | ICD-10-CM | POA: Diagnosis not present

## 2021-05-28 DIAGNOSIS — R2681 Unsteadiness on feet: Secondary | ICD-10-CM | POA: Diagnosis not present

## 2021-05-28 DIAGNOSIS — I788 Other diseases of capillaries: Secondary | ICD-10-CM | POA: Diagnosis not present

## 2021-05-28 DIAGNOSIS — L298 Other pruritus: Secondary | ICD-10-CM | POA: Diagnosis not present

## 2021-05-28 DIAGNOSIS — M6281 Muscle weakness (generalized): Secondary | ICD-10-CM | POA: Insufficient documentation

## 2021-05-28 DIAGNOSIS — L821 Other seborrheic keratosis: Secondary | ICD-10-CM | POA: Diagnosis not present

## 2021-05-28 DIAGNOSIS — L538 Other specified erythematous conditions: Secondary | ICD-10-CM | POA: Diagnosis not present

## 2021-05-28 DIAGNOSIS — D225 Melanocytic nevi of trunk: Secondary | ICD-10-CM | POA: Diagnosis not present

## 2021-05-28 DIAGNOSIS — D230 Other benign neoplasm of skin of lip: Secondary | ICD-10-CM | POA: Diagnosis not present

## 2021-05-28 NOTE — Therapy (Signed)
[facility] [address], [place], [place], [address] Phone: [phone]   Fax:  [phone]  Physical Therapy Treatment  Patient Details  Name: Wesley [name]. MRN: [id number] Date of Birth: Jun 06, 1941 Referring Provider (PT): Dr. Stefanie Libel   Encounter Date: 05/28/2021   PT End of Session - 05/28/21 1658     Visit Number 5    Number of Visits 9    Date for PT Re-Evaluation 06/13/21    Authorization Type Medicare/Tricare    PT Start Time 1447    PT Stop Time 1532    PT Time Calculation (min) 45 min    Activity Tolerance Patient tolerated treatment well    Behavior During Therapy WFL for tasks assessed/performed             Past Medical History:  Diagnosis Date   A-fib [facility])    cardioversion x2   Ankylosing spondylitis ([place])    Atrial flutter (HCC)    GERD (gastroesophageal reflux disease)    Hemorrhoids    Irregular heart beat    MVP (mitral valve prolapse)    WITH A MIDSYSTOLIC CLICK   OSA (obstructive sleep apnea)    moderate OSA NPSG 10/24/09 - AHO 21.8/hr   Peripheral neuropathy    Vitamin B12 deficiency     Past Surgical History:  Procedure Laterality Date   ABLATION     CARDIOVERSION N/A 12/20/2019   Procedure: CARDIOVERSION;  Surgeon: Jerline Pain, MD;  Location: [place];  Service: Cardiovascular;  Laterality: N/A;   COLONOSCOPY     INGUINAL HERNIA REPAIR     venous ligation     for varices left lower leg    There were no vitals filed for this visit.   Subjective Assessment - 05/28/21 1449     Subjective Didn't sleep well last night and have had a busy day today.  Maybe the turning is a little better.    Patient Stated Goals Pt's goal for therapy is to improve balance, reduce risk of falling.    Currently in Pain? Yes    Pain Score 2    baseline pain   Pain Onset More than a month ago                               [facility] Adult PT Treatment/Exercise - 05/28/21 0001        Ambulation/Gait   Ambulation/Gait Yes    Ambulation/Gait Assistance 6: Modified independent (Device/Increase time)    Ambulation Distance (Feet) 30 Feet   x 6   Assistive device None    Gait Pattern Step-through pattern;Right flexed knee in stance;Left flexed knee in stance    Ambulation Surface Level;Indoor    Gait Comments Cues to look ahead at eye level or about 10 ft ahead on ground to allow for visual target with straight line gait.  Additional 30 ft x 4 with head turns, 30 ft x 4 with head nods with supervision.  Slowed pattern more so with head turns                 Balance Exercises - 05/28/21 0001       Balance Exercises: Standing   Stepping Strategy Anterior;Posterior;Lateral;Foam/compliant surface;UE support;10 reps;Limitations    Stepping Strategy Limitations Standing on Airex beam    Rockerboard Anterior/posterior;Lateral;EO;Intermittent UE support;Limitations    Rockerboard Limitations Hip/ankle strategy work, then maintaining board in midline  with arms by sides, then with arm swing.  Maintaining board in midline with head turns x 10, head nods x 10.  Min guard/supervision provided throughout.    Sidestepping Upper extremity support;Foam/compliant support;Limitations    Sidestepping Limitations On solid surface 3 reps R and L, then on Airex beam, 2 reps R and L intermittent UE support.    Other Standing Exercises Resisted sidestepping in parallel bars, green band above knees x 3 reps R and L; monster walk forward/back in parallel bars, green band resistance at ankles x 3 reps.                PT Education - 05/28/21 1535     Education Details Additions to HEP to address hip strengthening-see instructions    Person(s) Educated Patient    Methods Explanation;Demonstration;Handout    Comprehension Verbalized understanding;Returned demonstration              PT Short Term Goals - 05/16/21 1149       PT SHORT TERM GOAL #1   Title STGs = LTGs                PT Long Term Goals - 05/21/21 1517       PT LONG TERM GOAL #1   Title Pt will be independent with HEP for improved strength, balance, transfers, and gait.  TARGET 06/13/2021    Time 4    Period Weeks    Status New      PT LONG TERM GOAL #2   Title Pt will improve 5x sit<>stand to less than or equal to 12 sec to demonstrate improved functional strength and transfer efficiency.    Baseline 12.78 sec    Time 4    Period Weeks    Status New      PT LONG TERM GOAL #3   Title Pt will improve DGI score to by at least 3 points to decrease fall risk.    Baseline 16/24    Time 4    Period Weeks    Status New      PT LONG TERM GOAL #4   Title Pt will improve standing eyes closed on compliant surface, for at least 15 seconds to demonstrate improved vestibular system use for balance.    Baseline 6 seconds    Time 4    Period Weeks    Status New      PT LONG TERM GOAL #5   Title Pt will verbalize understanding of fall prevention in home environment.    Time 4    Period Weeks    Status New                   Plan - 05/28/21 1659     Clinical Impression Statement Pt responds well to cues to use visual target for gait at eye level or at floor about 10 ft ahead.  Added resisted band hip strengthening exercises to HEP to encourage hip abduction strength to assist with wider BOS with gait.  Pt continues to benefit from skilled PT to address balance, strength and gait towards LTGs.    Personal Factors and Comorbidities Comorbidity 3+    Comorbidities PMH:  PAF, atrial tachycardia s/p cardioversion 11/2019 and ablation 7/21, ankylosing spondylitis, GERD, MVP, OSA    Examination-Activity Limitations Locomotion Level;Transfers;Stand    Examination-Participation Restrictions Community Activity;Other   community exercise, travel   Stability/Clinical Decision Making Evolving/Moderate complexity    Rehab Potential Good    PT  Frequency 2x / week    PT Duration 4 weeks    plus eval   PT Treatment/Interventions ADLs/Self Care Home Management;Gait training;Functional mobility training;Therapeutic activities;Therapeutic exercise;Balance training;Neuromuscular re-education;Patient/family education    PT Next Visit Plan Review HEP updates; Continue compliant surfaces, eyes closed/ head motions; work on single limb and tandem stance;  progress to gait with head turns/head nods.  Use of visual target for straight line gait and head turns>body turns with turning technique.  Continued turning practice with gait.    Consulted and Agree with Plan of Care Patient             Patient will benefit from skilled therapeutic intervention in order to improve the following deficits and impairments:  Abnormal gait, Difficulty walking, Decreased balance, Decreased mobility, Decreased strength  Visit Diagnosis: Unsteadiness on feet  Muscle weakness (generalized)  Other abnormalities of gait and mobility     Problem List Patient Active Problem List   Diagnosis Date Noted   Chronic hoarseness 10/04/2020   PAF (paroxysmal atrial fibrillation) (HCC)    DDD (degenerative disc disease), lumbar 10/28/2017   Bilateral bunions 08/20/2016   Varicose veins of right lower extremity with complications 97/67/3419   Bruit 10/10/2015   Varicose veins of leg with complications 37/90/2409   Ankylosing spondylitis ([place]) 01/14/2015   Chronic anticoagulation 01/14/2015   Sleep apnea 01/14/2015   S/P ablation of atrial fibrillation 01/08/2015   Persistent atrial fibrillation ([place]) 01/08/2015   Orthostatic dizziness 12/01/2013   Calf pain 11/22/2012   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/22/2012   Peripheral neuropathy 10/21/2012   Pain in joint, ankle and foot 09/21/2012   Disturbance of skin sensation 09/21/2012   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 ([place]) 09/21/2012   Other general symptoms(780.99) 09/21/2012   Heel pain 06/07/2012   Plantar fasciitis 06/07/2012    Mitral valve prolapse 06/03/2011   Arthritis pain of shoulder 04/03/2011   Rotator cuff syndrome of right shoulder 04/03/2011   Paroxysmal atrial fibrillation ([place]) 01/20/2011   Hypercholesterolemia 01/20/2011   OBSTRUCTIVE SLEEP APNEA 10/08/2009    Cherylynn Liszewski W., PT 05/28/2021, 5:03 PM  [facility] [address] [place], [place], [address] Phone: [phone]   Fax:  [phone]  Name: Wesley Chris. MRN: [id number] Date of Birth: 1941/03/30

## 2021-05-28 NOTE — Patient Instructions (Signed)
Access Code: [email or web address] URL: [email or web address] Date: 05/28/2021 Prepared by: Mady Haagensen  Exercises Standing on Foam Pad - 1 x daily - 5 x weekly - 1 sets - 10 reps Romberg Stance on Foam Pad - 1 x daily - 5 x weekly - 1 sets - 10 reps  Added 05/28/21: Side Stepping with Resistance at Thighs - 1 x daily - 5 x weekly - 1 sets - 4-5 reps Forward and Backward Monster Walk with Resistance at Ankles and Counter Support - 1 x daily - 5 x weekly - 1 sets - 4-5 reps

## 2021-06-02 ENCOUNTER — Ambulatory Visit: Payer: Medicare Other | Admitting: Physical Therapy

## 2021-06-02 ENCOUNTER — Other Ambulatory Visit: Payer: Self-pay

## 2021-06-02 DIAGNOSIS — R2681 Unsteadiness on feet: Secondary | ICD-10-CM | POA: Diagnosis not present

## 2021-06-02 DIAGNOSIS — R2689 Other abnormalities of gait and mobility: Secondary | ICD-10-CM

## 2021-06-02 DIAGNOSIS — M6281 Muscle weakness (generalized): Secondary | ICD-10-CM | POA: Diagnosis not present

## 2021-06-02 NOTE — Therapy (Signed)
[facility] [address] [place], [place], [address] Phone: [phone]   Fax:  [phone]  Physical Therapy Treatment  Patient Details  Name: Wesley [name]. MRN: [id number] Date of Birth: March 07, 1941 Referring Provider (PT): Dr. Stefanie Libel   Encounter Date: 06/02/2021   PT End of Session - 06/02/21 1720     Visit Number 6    Number of Visits 9    Date for PT Re-Evaluation 06/13/21    Authorization Type Medicare/Tricare    PT Start Time 8299   PT running late from previous session   PT Stop Time 1451    PT Time Calculation (min) 40 min    Equipment Utilized During Treatment Gait belt    Activity Tolerance Patient tolerated treatment well    Behavior During Therapy WFL for tasks assessed/performed             Past Medical History:  Diagnosis Date   A-fib [facility])    cardioversion x2   Ankylosing spondylitis ([place])    Atrial flutter (HCC)    GERD (gastroesophageal reflux disease)    Hemorrhoids    Irregular heart beat    MVP (mitral valve prolapse)    WITH A MIDSYSTOLIC CLICK   OSA (obstructive sleep apnea)    moderate OSA NPSG 10/24/09 - AHO 21.8/hr   Peripheral neuropathy    Vitamin B12 deficiency     Past Surgical History:  Procedure Laterality Date   ABLATION     CARDIOVERSION N/A 12/20/2019   Procedure: CARDIOVERSION;  Surgeon: Jerline Pain, MD;  [facility];  Service: Cardiovascular;  Laterality: N/A;   COLONOSCOPY     INGUINAL HERNIA REPAIR     venous ligation     for varices left lower leg    There were no vitals filed for this visit.   Subjective Assessment - 06/02/21 1412     Subjective Trying to keep my head up more and using the turning techniques a little more.    Patient Stated Goals Pt's goal for therapy is to improve balance, reduce risk of falling.    Pain Onset More than a month ago                          Therapeutic Activities:  Short distances of  gait (below), and review of monster walk forward (educated in doing backwards as well), with no resistance in clinic today.  (Pt is using resistance when he performs at gym).     [facility] Adult PT Treatment/Exercise - 06/02/21 0001       Ambulation/Gait   Ambulation/Gait Yes    Ambulation/Gait Assistance 6: Modified independent (Device/Increase time)    Ambulation Distance (Feet) 60 Feet   x 4   Assistive device None;Other (Comment)   single walking pole   Gait Pattern Step-through pattern;Right flexed knee in stance;Left flexed knee in stance    Ambulation Surface Level;Indoor    Gait Comments Trial of single walking pole in clinic, initial cues for sequence and pt does nice job with use of walking pole.  Pt reports feeling more steady with additional stability of walking pole.      Exercises   Exercises Knee/Hip      Knee/Hip Exercises: Stretches   Active Hamstring Stretch Right;Left;3 reps;30 seconds    Active Hamstring Stretch Limitations foot propped on floor, cues for technique  Balance Exercises - 06/02/21 0001       Balance Exercises: Standing   SLS with Vectors Foam/compliant surface;Upper extremity assist 2;Intermittent upper extremity assist;Limitations    SLS with Vectors Limitations Standing on Airex:  Alternating forward single step taps, then double step taps to BOSU x 10 reps each    Partial Tandem Stance Eyes open;Eyes closed;Foam/compliant surface;Intermittent upper extremity support;Limitations    Partial Tandem Stance Limitations Eyes open head turns x 10, eyes open x 10; then eyes closed head steady x 3 reps, 10 sec each foot position with cues for glut activaiton.    Other Standing Exercises Sidestep up and over Airex, x 10 reps each R and L    Other Standing Exercises Comments On blue mat surface:  forward/back walking, forward/back monster walk, sidestepping, forward walk with turning around.  Pt uses vision initially looking down at ground  then at mirror in front, close supervision from PT, but no LOB.                PT Education - 06/02/21 1720     Education Details Additions to HEP; benefits of using walking pole    Person(s) Educated Patient    Methods Explanation;Demonstration;Handout    Comprehension Verbalized understanding;Returned demonstration              PT Short Term Goals - 05/16/21 1149       PT SHORT TERM GOAL #1   Title STGs = LTGs               PT Long Term Goals - 05/21/21 1517       PT LONG TERM GOAL #1   Title Pt will be independent with HEP for improved strength, balance, transfers, and gait.  TARGET 06/13/2021    Time 4    Period Weeks    Status New      PT LONG TERM GOAL #2   Title Pt will improve 5x sit<>stand to less than or equal to 12 sec to demonstrate improved functional strength and transfer efficiency.    Baseline 12.78 sec    Time 4    Period Weeks    Status New      PT LONG TERM GOAL #3   Title Pt will improve DGI score to by at least 3 points to decrease fall risk.    Baseline 16/24    Time 4    Period Weeks    Status New      PT LONG TERM GOAL #4   Title Pt will improve standing eyes closed on compliant surface, for at least 15 seconds to demonstrate improved vestibular system use for balance.    Baseline 6 seconds    Time 4    Period Weeks    Status New      PT LONG TERM GOAL #5   Title Pt will verbalize understanding of fall prevention in home environment.    Time 4    Period Weeks    Status New                   Plan - 06/02/21 1722     Clinical Impression Statement Continued to challenge patient with compliant surface balance activities.  On blue compliant mat surface with gait activities, pt has slowed pace, but overall good foot clearance and no LOB.  Trial of single walking pole in clinic, with pt reporting and demonstrating improved stability and noted wider BOS with gait.  He will continue  to benefit from skilled PT to  further address balance and gait towards LTGs.    Personal Factors and Comorbidities Comorbidity 3+    Comorbidities PMH:  PAF, atrial tachycardia s/p cardioversion 11/2019 and ablation 7/21, ankylosing spondylitis, GERD, MVP, OSA    Examination-Activity Limitations Locomotion Level;Transfers;Stand    Examination-Participation Restrictions Community Activity;Other   community exercise, travel   Stability/Clinical Decision Making Evolving/Moderate complexity    Rehab Potential Good    PT Frequency 2x / week    PT Duration 4 weeks   plus eval   PT Treatment/Interventions ADLs/Self Care Home Management;Gait training;Functional mobility training;Therapeutic activities;Therapeutic exercise;Balance training;Neuromuscular re-education;Patient/family education    PT Next Visit Plan Review HEP updates; Continue compliant surfaces, eyes closed/ head motions;gait with single walking pole on outdoor surfaces.  Fall prevention education    Consulted and Agree with Plan of Care Patient             Patient will benefit from skilled therapeutic intervention in order to improve the following deficits and impairments:  Abnormal gait, Difficulty walking, Decreased balance, Decreased mobility, Decreased strength  Visit Diagnosis: Unsteadiness on feet  Other abnormalities of gait and mobility     Problem List Patient Active Problem List   Diagnosis Date Noted   Chronic hoarseness 10/04/2020   PAF (paroxysmal atrial fibrillation) (HCC)    DDD (degenerative disc disease), lumbar 10/28/2017   Bilateral bunions 08/20/2016   Varicose veins of right lower extremity with complications 28/41/3244   Bruit 10/10/2015   Varicose veins of leg with complications 07/29/7251   Ankylosing spondylitis ([place]) 01/14/2015   Chronic anticoagulation 01/14/2015   Sleep apnea 01/14/2015   S/P ablation of atrial fibrillation 01/08/2015   Persistent atrial fibrillation (HCC) 01/08/2015   Orthostatic dizziness 12/01/2013    Calf pain 11/22/2012   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/22/2012   Peripheral neuropathy 10/21/2012   Pain in joint, ankle and foot 09/21/2012   Disturbance of skin sensation 09/21/2012   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 ([place]) 09/21/2012   Other general symptoms(780.99) 09/21/2012   Heel pain 06/07/2012   Plantar fasciitis 06/07/2012   Mitral valve prolapse 06/03/2011   Arthritis pain of shoulder 04/03/2011   Rotator cuff syndrome of right shoulder 04/03/2011   Paroxysmal atrial fibrillation ([place]) 01/20/2011   Hypercholesterolemia 01/20/2011   OBSTRUCTIVE SLEEP APNEA 10/08/2009    Devine Dant W., PT 06/02/2021, 5:26 PM  [facility] [address], [place], [place], [address] Phone: [phone]   Fax:  [phone]  Name: [name] MRN: [id number] Date of Birth: 1941-07-13

## 2021-06-02 NOTE — Patient Instructions (Addendum)
Access Code: [email or web address] URL: [email or web address] Date: 06/02/2021 Prepared by: Mady Haagensen  Exercises Standing on Foam Pad - 1 x daily - 5 x weekly - 1 sets - 10 reps Romberg Stance on Foam Pad - 1 x daily - 5 x weekly - 1 sets - 10 reps Side Stepping with Resistance at Thighs - 1 x daily - 5 x weekly - 1 sets - 4-5 reps Forward and Backward Monster Walk with Resistance at Ankles and Counter Support - 1 x daily - 5 x weekly - 1 sets - 4-5 reps  Added 06/02/2021 Standing Romberg to 1/2 Tandem Stance - 1 x daily - 5 x weekly - 1 sets - 10 reps Seated Hamstring Stretch - 1-2 x daily - 7 x weekly - 1 sets - 3 reps - 30 sec hold

## 2021-06-03 DIAGNOSIS — Z9889 Other specified postprocedural states: Secondary | ICD-10-CM | POA: Diagnosis not present

## 2021-06-03 DIAGNOSIS — Z7189 Other specified counseling: Secondary | ICD-10-CM | POA: Diagnosis not present

## 2021-06-03 DIAGNOSIS — I4819 Other persistent atrial fibrillation: Secondary | ICD-10-CM | POA: Diagnosis not present

## 2021-06-03 DIAGNOSIS — Z8679 Personal history of other diseases of the circulatory system: Secondary | ICD-10-CM | POA: Diagnosis not present

## 2021-06-03 DIAGNOSIS — R6889 Other general symptoms and signs: Secondary | ICD-10-CM | POA: Diagnosis not present

## 2021-06-04 ENCOUNTER — Ambulatory Visit: Payer: Self-pay | Admitting: Physical Therapy

## 2021-06-06 ENCOUNTER — Other Ambulatory Visit: Payer: Self-pay

## 2021-06-06 ENCOUNTER — Ambulatory Visit: Payer: Medicare Other | Admitting: Physical Therapy

## 2021-06-06 ENCOUNTER — Encounter: Payer: Self-pay | Admitting: Physical Therapy

## 2021-06-06 DIAGNOSIS — R2689 Other abnormalities of gait and mobility: Secondary | ICD-10-CM

## 2021-06-06 DIAGNOSIS — R2681 Unsteadiness on feet: Secondary | ICD-10-CM | POA: Diagnosis not present

## 2021-06-06 DIAGNOSIS — M6281 Muscle weakness (generalized): Secondary | ICD-10-CM | POA: Diagnosis not present

## 2021-06-06 NOTE — Therapy (Signed)
[facility] [address] [place], [place], [address] Phone: [phone]   Fax:  [phone]  Physical Therapy Treatment  Patient Details  Name: Wesley [name]. MRN: [id number] Date of Birth: March 05, 1941 Referring Provider (PT): Dr. Stefanie Libel   Encounter Date: 06/06/2021   PT End of Session - 06/06/21 1019     Visit Number 7    Number of Visits 9    Date for PT Re-Evaluation 06/13/21    Authorization Type Medicare/Tricare    PT Start Time 1020    PT Stop Time 1100    PT Time Calculation (min) 40 min    Equipment Utilized During Treatment Gait belt    Activity Tolerance Patient tolerated treatment well    Behavior During Therapy WFL for tasks assessed/performed             Past Medical History:  Diagnosis Date   A-fib ([place])    cardioversion x2   Ankylosing spondylitis ([place])    Atrial flutter ([facility])    GERD (gastroesophageal reflux disease)    Hemorrhoids    Irregular heart beat    MVP (mitral valve prolapse)    WITH A MIDSYSTOLIC CLICK   OSA (obstructive sleep apnea)    moderate OSA NPSG 10/24/09 - AHO 21.8/hr   Peripheral neuropathy    Vitamin B12 deficiency     Past Surgical History:  Procedure Laterality Date   ABLATION     CARDIOVERSION N/A 12/20/2019   Procedure: CARDIOVERSION;  Surgeon: Jerline Pain, MD;  Location: [facility];  Service: Cardiovascular;  Laterality: N/A;   COLONOSCOPY     INGUINAL HERNIA REPAIR     venous ligation     for varices left lower leg    There were no vitals filed for this visit.   Subjective Assessment - 06/06/21 1020     Subjective Had cardiologist visit at [facility] earlier this week.  No medication changes.    Patient Stated Goals Pt's goal for therapy is to improve balance, reduce risk of falling.    Currently in Pain? No/denies    Pain Onset More than a month ago                           Self Care:  Also discussed POC and plans to assess goals  next week.  Discussed gradual improvements in vestibular system use for balance, but use of walking pole for assistive device may be additionally helpful on outdoor surfaces to decrease fall risk.    [facility] Adult PT Treatment/Exercise - 06/06/21 0001       Self-Care   Self-Care Other Self-Care Comments    Other Self-Care Comments  Discussed fall prevention education             Reviewed HEP additions from last visit.  Reminder cues for hamstring stretch frequency and hold time. Standing Romberg to 1/2 Tandem Stance - 1 x daily - 5 x weekly - 1 sets - 10 reps Seated Hamstring Stretch - 1-2 x daily - 7 x weekly - 1 sets - 3 reps - 30 sec hold    Balance Exercises - 06/06/21 0001       Balance Exercises: Standing   Tandem Gait Forward;Retro;Intermittent upper extremity support;Limitations;5 reps    Tandem Gait Limitations Progress to tandem march forward/back in parallel bars x 3 reps    Other Standing Exercises Comments On blue mat surface:  forward/back  walking, forward/back monster walk, sidestepping, forward walk with turning around.  Forward gait with head turns x 4 reps, then head nods x 4 reps.  Close supervision from PT, but no LOB.           Incorporated dual tasking with tandem gait and standing exercises on blue mat surface with conversation tasks while performing balance activities.     PT Education - 06/06/21 1055     Education Details Fall prevention    Person(s) Educated Patient    Methods Explanation    Comprehension Verbalized understanding              PT Short Term Goals - 05/16/21 1149       PT SHORT TERM GOAL #1   Title STGs = LTGs               PT Long Term Goals - 05/21/21 1517       PT LONG TERM GOAL #1   Title Pt will be independent with HEP for improved strength, balance, transfers, and gait.  TARGET 06/13/2021    Time 4    Period Weeks    Status New      PT LONG TERM GOAL #2   Title Pt will improve 5x sit<>stand to less  than or equal to 12 sec to demonstrate improved functional strength and transfer efficiency.    Baseline 12.78 sec    Time 4    Period Weeks    Status New      PT LONG TERM GOAL #3   Title Pt will improve DGI score to by at least 3 points to decrease fall risk.    Baseline 16/24    Time 4    Period Weeks    Status New      PT LONG TERM GOAL #4   Title Pt will improve standing eyes closed on compliant surface, for at least 15 seconds to demonstrate improved vestibular system use for balance.    Baseline 6 seconds    Time 4    Period Weeks    Status New      PT LONG TERM GOAL #5   Title Pt will verbalize understanding of fall prevention in home environment.    Time 4    Period Weeks    Status New                   Plan - 06/06/21 1021     Clinical Impression Statement Reviewed updates to HEP and continued to work on compliant surfaces with and without UE support.  On compliant mat surface, PT provides min guard/supervision and pt has slowed gait with added head turns.  Discussed fall prevention education in session today.    Personal Factors and Comorbidities Comorbidity 3+    Comorbidities PMH:  PAF, atrial tachycardia s/p cardioversion 11/2019 and ablation 7/21, ankylosing spondylitis, GERD, MVP, OSA    Examination-Activity Limitations Locomotion Level;Transfers;Stand    Examination-Participation Restrictions Community Activity;Other   community exercise, travel   Stability/Clinical Decision Making Evolving/Moderate complexity    Rehab Potential Good    PT Frequency 2x / week    PT Duration 4 weeks   plus eval   PT Treatment/Interventions ADLs/Self Care Home Management;Gait training;Functional mobility training;Therapeutic activities;Therapeutic exercise;Balance training;Neuromuscular re-education;Patient/family education    PT Next Visit Plan Check STGs and discuss POC.  Continue compliant surfaces, eyes closed/ head motions;gait with single walking pole on outdoor  surfaces.  Review fall prevention education  Consulted and Agree with Plan of Care Patient             Patient will benefit from skilled therapeutic intervention in order to improve the following deficits and impairments:  Abnormal gait, Difficulty walking, Decreased balance, Decreased mobility, Decreased strength  Visit Diagnosis: Unsteadiness on feet  Other abnormalities of gait and mobility     Problem List Patient Active Problem List   Diagnosis Date Noted   Chronic hoarseness 10/04/2020   PAF (paroxysmal atrial fibrillation) (HCC)    DDD (degenerative disc disease), lumbar 10/28/2017   Bilateral bunions 08/20/2016   Varicose veins of right lower extremity with complications 95/03/3266   Bruit 10/10/2015   Varicose veins of leg with complications 12/45/8099   Ankylosing spondylitis ([place]) 01/14/2015   Chronic anticoagulation 01/14/2015   Sleep apnea 01/14/2015   S/P ablation of atrial fibrillation 01/08/2015   Persistent atrial fibrillation ([place]) 01/08/2015   Orthostatic dizziness 12/01/2013   Calf pain 11/22/2012   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/22/2012   Peripheral neuropathy 10/21/2012   Pain in joint, ankle and foot 09/21/2012   Disturbance of skin sensation 09/21/2012   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 ([place]) 09/21/2012   Other general symptoms(780.99) 09/21/2012   Heel pain 06/07/2012   Plantar fasciitis 06/07/2012   Mitral valve prolapse 06/03/2011   Arthritis pain of shoulder 04/03/2011   Rotator cuff syndrome of right shoulder 04/03/2011   Paroxysmal atrial fibrillation ([place]) 01/20/2011   Hypercholesterolemia 01/20/2011   OBSTRUCTIVE SLEEP APNEA 10/08/2009    Zoe Creasman W., PT 06/06/2021, 12:04 PM  [facility] [address] [place], [place], [address] Phone: [phone]   Fax:  [phone]  Name: Wesley [name]. MRN: [id number] Date of Birth: [birth year]

## 2021-06-09 ENCOUNTER — Ambulatory Visit: Payer: Medicare Other | Admitting: Physical Therapy

## 2021-06-09 DIAGNOSIS — Z1152 Encounter for screening for COVID-19: Secondary | ICD-10-CM | POA: Diagnosis not present

## 2021-06-09 DIAGNOSIS — J029 Acute pharyngitis, unspecified: Secondary | ICD-10-CM | POA: Diagnosis not present

## 2021-06-09 DIAGNOSIS — Z7901 Long term (current) use of anticoagulants: Secondary | ICD-10-CM | POA: Diagnosis not present

## 2021-06-09 DIAGNOSIS — I2721 Secondary pulmonary arterial hypertension: Secondary | ICD-10-CM | POA: Diagnosis not present

## 2021-06-09 DIAGNOSIS — I480 Paroxysmal atrial fibrillation: Secondary | ICD-10-CM | POA: Diagnosis not present

## 2021-06-09 DIAGNOSIS — J069 Acute upper respiratory infection, unspecified: Secondary | ICD-10-CM | POA: Diagnosis not present

## 2021-06-09 DIAGNOSIS — R059 Cough, unspecified: Secondary | ICD-10-CM | POA: Diagnosis not present

## 2021-06-09 DIAGNOSIS — R0981 Nasal congestion: Secondary | ICD-10-CM | POA: Diagnosis not present

## 2021-06-11 ENCOUNTER — Ambulatory Visit: Payer: Self-pay | Admitting: Physical Therapy

## 2021-06-12 DIAGNOSIS — Z9889 Other specified postprocedural states: Secondary | ICD-10-CM | POA: Diagnosis not present

## 2021-06-12 DIAGNOSIS — I4819 Other persistent atrial fibrillation: Secondary | ICD-10-CM | POA: Diagnosis not present

## 2021-06-12 DIAGNOSIS — Z8679 Personal history of other diseases of the circulatory system: Secondary | ICD-10-CM | POA: Diagnosis not present

## 2021-06-21 ENCOUNTER — Other Ambulatory Visit: Payer: Self-pay | Admitting: Cardiology

## 2021-06-23 ENCOUNTER — Ambulatory Visit: Payer: Medicare Other | Admitting: Physical Therapy

## 2021-06-23 NOTE — Telephone Encounter (Signed)
Prescription refill request for Eliquis received. Indication:Afib Last office visit:3/22 Scr:0.8 Age: 80 Weight:98 kg  Prescription refilled

## 2021-06-25 ENCOUNTER — Ambulatory Visit: Payer: Medicare Other | Admitting: Physical Therapy

## 2021-06-26 ENCOUNTER — Ambulatory Visit (INDEPENDENT_AMBULATORY_CARE_PROVIDER_SITE_OTHER): Payer: Medicare Other | Admitting: Sports Medicine

## 2021-06-26 DIAGNOSIS — G603 Idiopathic progressive neuropathy: Secondary | ICD-10-CM

## 2021-06-26 DIAGNOSIS — R269 Unspecified abnormalities of gait and mobility: Secondary | ICD-10-CM | POA: Diagnosis not present

## 2021-06-26 NOTE — Assessment & Plan Note (Signed)
He still seems to respond well to gabapentin and clearly does worse when he does not take  We will leave him at [phone] as he feels this is making him have tolerable symptoms without side effects

## 2021-06-26 NOTE — Patient Instructions (Addendum)
Needs strength, balance and gait exercises  Hip flexion Up from chair Toe walk SLR  Keep up balance  Add recumbent bike  or some rowing machine  Keep meds same  2 month follow up

## 2021-06-26 NOTE — Progress Notes (Signed)
Chief complaint worsening symptoms with peripheral neuropathy and an unstable gait  On last visit we increased gabapentin Extra dose has seemed to help Sleeps deeper Numbness decrease  We also referred him to neuro PT for assessment of fall risk and gait training doing neuro rehab Balance exercises teaching him issues Difficult balance if any congestion Neuro PT also seems to be helping By their assessment he is at high fall risk  PE Gen - NAD BP 110/72   Ht 6\' 3"  (1.905 m)   Wt 216 lb (98 kg)   BMI 27.00 kg/m  [facility] Adult Exercise 05/08/2021  Frequency of aerobic exercise (# of days/week) 3  Average time in minutes 25  Frequency of strengthening activities (# of days/week) 3   Gait - trying to keep head up Trying not to shuffle He is able to complete turns and can turn twice in a circle without showing signs of instability to the extent where he would fall.  However he relies on his inside leg rather than crossing over as you would normally do. Toe walk shows that he has significant weakness bilaterally but left may be worse Heel walk he does pretty well Tandem walk and crossover walk are somewhat unstable He can do long striding

## 2021-06-26 NOTE — Assessment & Plan Note (Signed)
We will have him continue working with PT. I gave him additional home balancing exercises. He has some weakness on sit to stand maneuver and I suggested exercises to improve this as well as other general strength exercises. We plan to recheck this in 2 months

## 2021-06-30 ENCOUNTER — Ambulatory Visit: Payer: Medicare Other | Attending: Sports Medicine | Admitting: Physical Therapy

## 2021-06-30 ENCOUNTER — Encounter: Payer: Self-pay | Admitting: Physical Therapy

## 2021-06-30 ENCOUNTER — Other Ambulatory Visit: Payer: Self-pay

## 2021-06-30 DIAGNOSIS — M6281 Muscle weakness (generalized): Secondary | ICD-10-CM | POA: Insufficient documentation

## 2021-06-30 DIAGNOSIS — R2689 Other abnormalities of gait and mobility: Secondary | ICD-10-CM | POA: Diagnosis not present

## 2021-06-30 DIAGNOSIS — R2681 Unsteadiness on feet: Secondary | ICD-10-CM | POA: Diagnosis not present

## 2021-07-01 NOTE — Therapy (Signed)
[facility] [address], [place], [place], [address] Phone: [phone]   Fax:  [phone]  Physical Therapy Treatment  Patient Details  Name: Wesley [name]. MRN: [id number] Date of Birth: October 22, 1940 Referring Provider (PT): Dr. Stefanie Libel   Encounter Date: 06/30/2021   PT End of Session - 06/30/21 1448     Visit Number 8    Number of Visits 9    Date for PT Re-Evaluation 07/04/21    Authorization Type Medicare/Tricare    PT Start Time 1445    PT Stop Time 1530    PT Time Calculation (min) 45 min    Equipment Utilized During Treatment --    Activity Tolerance Patient tolerated treatment well    Behavior During Therapy WFL for tasks assessed/performed             Past Medical History:  Diagnosis Date   A-fib [facility])    cardioversion x2   Ankylosing spondylitis ([place])    Atrial flutter (HCC)    GERD (gastroesophageal reflux disease)    Hemorrhoids    Irregular heart beat    MVP (mitral valve prolapse)    WITH A MIDSYSTOLIC CLICK   OSA (obstructive sleep apnea)    moderate OSA NPSG 10/24/09 - AHO 21.8/hr   Peripheral neuropathy    Vitamin B12 deficiency     Past Surgical History:  Procedure Laterality Date   ABLATION     CARDIOVERSION N/A 12/20/2019   Procedure: CARDIOVERSION;  Surgeon: Jerline Pain, MD;  Location: [facility];  Service: Cardiovascular;  Laterality: N/A;   COLONOSCOPY     INGUINAL HERNIA REPAIR     venous ligation     for varices left lower leg    There were no vitals filed for this visit.   Subjective Assessment - 06/30/21 1446     Subjective Had a respiratory infection and that has taken up the past 3 weeks.  Now I have a heart monitor, just as follow up from cardiologist.    Patient Stated Goals Pt's goal for therapy is to improve balance, reduce risk of falling.    Currently in Pain? No/denies    Pain Onset More than a month ago                [facility] PT Assessment -  06/30/21 1450       Dynamic Gait Index   Level Surface Mild Impairment   6.28   Change in Gait Speed Normal    Gait with Horizontal Head Turns Normal   6.03   Gait with Vertical Head Turns Normal   5.89   Gait and Pivot Turn Normal    Step Over Obstacle Mild Impairment    Step Around Obstacles Mild Impairment    Steps Mild Impairment    Total Score 20    DGI comment: improved from 16/24      Functional Gait  Assessment   Gait assessed  Yes    Gait Level Surface Walks 20 ft in less than 7 sec but greater than 5.5 sec, uses assistive device, slower speed, mild gait deviations, or deviates 6-10 in outside of the 12 in walkway width.   6.28   Change in Gait Speed Able to smoothly change walking speed without loss of balance or gait deviation. Deviate no more than 6 in outside of the 12 in walkway width.    Gait with Horizontal Head Turns Performs head turns smoothly with no  change in gait. Deviates no more than 6 in outside 12 in walkway width   6.03   Gait with Vertical Head Turns Performs head turns with no change in gait. Deviates no more than 6 in outside 12 in walkway width.   6.03   Gait and Pivot Turn Pivot turns safely within 3 sec and stops quickly with no loss of balance.    Step Over Obstacle Is able to step over one shoe box (4.5 in total height) without changing gait speed. No evidence of imbalance.    Gait with Narrow Base of Support Ambulates 4-7 steps.   6   Gait with Eyes Closed Walks 20 ft, slow speed, abnormal gait pattern, evidence for imbalance, deviates 10-15 in outside 12 in walkway width. Requires more than 9 sec to ambulate 20 ft.   10.07   Ambulating Backwards Walks 20 ft, no assistive devices, good speed, no evidence for imbalance, normal gait   14.07   Steps Alternating feet, must use rail.    Total Score 23    FGA comment: Scores <22/30 indicate increased fall risk                           [facility] Adult PT Treatment/Exercise - 06/30/21 1450        Transfers   Transfers Sit to Stand;Stand to Sit    Sit to Stand 6: Modified independent (Device/Increase time);From chair/3-in-1;Without upper extremity assist    Five time sit to stand comments  10.62    Stand to Sit 6: Modified independent (Device/Increase time);Without upper extremity assist;To chair/3-in-1      Ambulation/Gait   Ambulation/Gait Yes      Standardized Balance Assessment   Standardized Balance Assessment Dynamic Gait Index      Dynamic Gait Index   Level Surface --   6.28     Self-Care   Self-Care Other Self-Care Comments    Other Self-Care Comments  Discussed progress towards goals; reviewed fall prevention education.  Verbally reviewed parts of HEP; discussed extending/continuing POC through this week for any updates to HEP.  Pt asks about follow up in several months and discussed return eval (pt to ask Dr. Oneida Alar at Feb 2023 visit) to re-assess balance at that time.      Neuro Re-ed    Neuro Re-ed Details  Standing solid surface:  EO and EC 30 sec, EO and EC on foam surface 30 seconds each. (EC improved from 6 seconds)             Pt performs standing heel raises x 5 reps, 2 sets with UE support; then toe walking 8-10 ft with no UE support (given by Dr. Oneida Alar as part of BLE strengthening HEP).  Pt has increased forward lean and PT provides cues to maintain upright trunk.       PT Education - 07/01/21 0807     Education Details Progress towards goals, POC    Person(s) Educated Patient    Methods Explanation    Comprehension Verbalized understanding              PT Short Term Goals - 05/16/21 1149       PT SHORT TERM GOAL #1   Title STGs = LTGs               PT Long Term Goals - 06/30/21 1450       PT LONG TERM GOAL #1   Title Pt will  be independent with HEP for improved strength, balance, transfers, and gait.  TARGET 06/13/2021    Baseline verbally reviewed and verbalizes understanding    Time 4    Period Weeks    Status  Achieved      PT LONG TERM GOAL #2   Title Pt will improve 5x sit<>stand to less than or equal to 12 sec to demonstrate improved functional strength and transfer efficiency.    Baseline 12.78 sec; 10.62 sec 06/30/2021    Time 4    Period Weeks    Status Achieved      PT LONG TERM GOAL #3   Title Pt will improve DGI score to by at least 3 points to decrease fall risk.    Baseline 16/24; 20/24 06/30/2021    Time 4    Period Weeks    Status Achieved      PT LONG TERM GOAL #4   Title Pt will improve standing eyes closed on compliant surface, for at least 15 seconds to demonstrate improved vestibular system use for balance.    Baseline 6 seconds; 30 sec 06/30/2021    Time 4    Period Weeks    Status Achieved      PT LONG TERM GOAL #5   Title Pt will verbalize understanding of fall prevention in home environment.    Time 4    Period Weeks    Status Achieved            NEW goal for recert:     PT Long Term Goals - 07/01/21 1610       PT LONG TERM GOAL #1   Title Pt will be independent with progression of HEP for improved strength, balance, transfers, and gait.  TARGET 07/04/2021    Time 1    Period Weeks    Status Revised                 Plan - 07/01/21 0811     Clinical Impression Statement Pt has not attended therapy sessions in the past several weeks due to respiratory illness.  He returns to [place] today, and LTGs assessed.  Pt has met 5 of 5 LTGs.  He has demonstrated improvement in mobility measures, 5x sit<>Stand, DGI, and vestibular system use for balance.  He has seen Dr. Oneida Alar and he gave him additional strength exercises for ankle strengthening.  With ankle plantarflexion exercises, he demonstrates decreased ankle plantarflexion strength.  Pt would benefit from one additional visit this week to address upgrading HEP for continued high level balance and strengthening for improved overall mobility.    Personal Factors and Comorbidities Comorbidity 3+     Comorbidities PMH:  PAF, atrial tachycardia s/p cardioversion 11/2019 and ablation 7/21, ankylosing spondylitis, GERD, MVP, OSA    Examination-Activity Limitations Locomotion Level;Transfers;Stand    Examination-Participation Restrictions Community Activity;Other   community exercise, travel   Stability/Clinical Decision Making Evolving/Moderate complexity    Rehab Potential Good    PT Frequency 2x / week    PT Duration Other (comment)   1 week   PT Treatment/Interventions ADLs/Self Care Home Management;Gait training;Functional mobility training;Therapeutic activities;Therapeutic exercise;Balance training;Neuromuscular re-education;Patient/family education    PT Next Visit Plan Review full HEP and add any additional exercises as needed.  Plan for d/c next visit.    Consulted and Agree with Plan of Care Patient             Patient will benefit from skilled therapeutic intervention in order to improve the  following deficits and impairments:  Abnormal gait, Difficulty walking, Decreased balance, Decreased mobility, Decreased strength  Visit Diagnosis: Unsteadiness on feet  Muscle weakness (generalized)  Other abnormalities of gait and mobility     Problem List Patient Active Problem List   Diagnosis Date Noted   Abnormality of gait 06/26/2021   Chronic hoarseness 10/04/2020   PAF (paroxysmal atrial fibrillation) (HCC)    DDD (degenerative disc disease), lumbar 10/28/2017   Bilateral bunions 08/20/2016   Varicose veins of right lower extremity with complications 00/94/1791   Bruit 10/10/2015   Varicose veins of leg with complications 99/57/9009   Ankylosing spondylitis ([place]) 01/14/2015   Chronic anticoagulation 01/14/2015   Sleep apnea 01/14/2015   S/P ablation of atrial fibrillation 01/08/2015   Persistent atrial fibrillation ([place]) 01/08/2015   Orthostatic dizziness 12/01/2013   Calf pain 11/22/2012   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/22/2012   Peripheral neuropathy 10/21/2012    Pain in joint, ankle and foot 09/21/2012   Disturbance of skin sensation 09/21/2012   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 ([place]) 09/21/2012   Other general symptoms(780.99) 09/21/2012   Heel pain 06/07/2012   Plantar fasciitis 06/07/2012   Mitral valve prolapse 06/03/2011   Arthritis pain of shoulder 04/03/2011   Rotator cuff syndrome of right shoulder 04/03/2011   Paroxysmal atrial fibrillation ([place]) 01/20/2011   Hypercholesterolemia 01/20/2011   OBSTRUCTIVE SLEEP APNEA 10/08/2009    Lovis More W., PT 07/01/2021, 8:20 AM  [facility] [address] [place], [place], [address] Phone: [phone]   Fax:  [phone]  Name: [name] MRN: [id number] Date of Birth: 26-Jun-1941

## 2021-07-02 ENCOUNTER — Ambulatory Visit: Payer: Medicare Other | Admitting: Physical Therapy

## 2021-07-04 ENCOUNTER — Encounter: Payer: Self-pay | Admitting: Physical Therapy

## 2021-07-04 ENCOUNTER — Ambulatory Visit: Payer: Medicare Other | Admitting: Physical Therapy

## 2021-07-04 ENCOUNTER — Other Ambulatory Visit: Payer: Self-pay

## 2021-07-04 DIAGNOSIS — R2681 Unsteadiness on feet: Secondary | ICD-10-CM | POA: Diagnosis not present

## 2021-07-04 DIAGNOSIS — M6281 Muscle weakness (generalized): Secondary | ICD-10-CM

## 2021-07-04 DIAGNOSIS — R2689 Other abnormalities of gait and mobility: Secondary | ICD-10-CM

## 2021-07-04 NOTE — Patient Instructions (Signed)
Access Code: [email or web address] URL: [email or web address] Date: 07/04/2021 Prepared by: [facility]  Exercises Romberg Stance on Foam Pad - 1 x daily - 5 x weekly - 1 sets - 10 reps Side Stepping with Resistance at Thighs - 1 x daily - 5 x weekly - 1 sets - 4-5 reps Forward and Backward Monster Walk with Resistance at Ankles and Counter Support - 1 x daily - 5 x weekly - 1 sets - 4-5 reps Standing Romberg to 1/2 Tandem Stance - 1 x daily - 5 x weekly - 1 sets - 10 reps Seated Hamstring Stretch - 1-2 x daily - 7 x weekly - 1 sets - 3 reps - 30 sec hold Alternating Heel Raises - 1 x daily - 5 x weekly - 2-3 sets - 10 reps Staggered Stance Forward Backward Weight Shift with Counter Support - 1 x daily - 5 x weekly - 2-3 sets - 10 reps Single Leg Heel Raise with Counter Support - 1 x daily - 5 x weekly - 2 sets - 10 reps - 3 sec hold Mini Squat with Counter Support - 1 x daily - 5 x weekly - 2 sets - 10 reps

## 2021-07-04 NOTE — Therapy (Signed)
[facility] [address], [place], [place], [address] Phone: [phone]   Fax:  [phone]  Physical Therapy Treatment/Discharge Summary  Patient Details  Name: Wesley [name]. MRN: [id number] Date of Birth: 13-Jul-1941 Referring Provider (PT): Dr. Stefanie Libel    PHYSICAL THERAPY DISCHARGE SUMMARY  Visits from Start of Care: 9  Current functional level related to goals / functional outcomes:  PT Long Term Goals - 07/04/21 0843       PT LONG TERM GOAL #1   Title Pt will be independent with progression of HEP for improved strength, balance, transfers, and gait.  TARGET 07/04/2021    Baseline Exercises added 07/04/21 and pt return demo understanding    Time 1    Period Weeks    Status Achieved               Remaining deficits: Gastroc weakness, high level balance (improving and continuing to be addressed with HEP)   Education / Equipment: HEP additions, fall prevention  Patient agrees to discharge. Patient goals were met. Patient is being discharged due to meeting the stated rehab goals.  Mady Haagensen, PT 07/04/21 8:51 AM Phone: [phone] Fax: [phone]   Encounter Date: 07/04/2021   PT End of Session - 07/04/21 0802     Visit Number 9    Number of Visits 9    Date for PT Re-Evaluation 07/04/21    Authorization Type Medicare/Tricare    PT Start Time 0804    PT Stop Time 0844    PT Time Calculation (min) 40 min    Activity Tolerance Patient tolerated treatment well    Behavior During Therapy [facility] for tasks assessed/performed             Past Medical History:  Diagnosis Date   A-fib ([place])    cardioversion x2   Ankylosing spondylitis ([place])    Atrial flutter (HCC)    GERD (gastroesophageal reflux disease)    Hemorrhoids    Irregular heart beat    MVP (mitral valve prolapse)    WITH A MIDSYSTOLIC CLICK   OSA (obstructive sleep apnea)    moderate OSA NPSG 10/24/09 - AHO 21.8/hr   Peripheral  neuropathy    Vitamin B12 deficiency     Past Surgical History:  Procedure Laterality Date   ABLATION     CARDIOVERSION N/A 12/20/2019   Procedure: CARDIOVERSION;  Surgeon: Jerline Pain, MD;  Location: [employer];  Service: Cardiovascular;  Laterality: N/A;   COLONOSCOPY     INGUINAL HERNIA REPAIR     venous ligation     for varices left lower leg    There were no vitals filed for this visit.   Subjective Assessment - 07/04/21 0801     Subjective Just been very busy.  Helping my wife after a medical procedure.    Patient Stated Goals Pt's goal for therapy is to improve balance, reduce risk of falling.    Currently in Pain? No/denies    Pain Onset More than a month ago                               [facility] Adult PT Treatment/Exercise - 07/04/21 0001       Ambulation/Gait   Ambulation/Gait Yes    Ambulation/Gait Assistance 6: Modified independent (Device/Increase time)    Gait Comments Short distance gait with changes of direction, weaving through dark tiles in  clinic, cues for wide BOS and heelstrike, with pt returning demo.  Progress to figure-8 turns, continued cues and practice for heelstrike and widened BOS.      Self-Care   Self-Care Other Self-Care Comments    Other Self-Care Comments  Discussed progress towards goals, POC; benefits of combination of hip and ankle strengthening, specific balance exercises and strategies to assist with balance (plenty of lighting, use of walking pole for exercise walking)      Exercises   Exercises Ankle    Other Exercises  Toe walking 10 steps x 4 reps, cues for upright posture; heel/toe raises x 10 reps, 3 sec hold.  Single leg heel raises x 5 reps with definite UE support, alternating legs up on toes, x 10 reps; stagger stance rocking forward/back x 10 reps.  Cues for technique throughout      Knee/Hip Exercises: Standing   Functional Squat 1 set;10 reps;Limitations    Functional Squat Limitations Standing on  [employer]                     PT Education - 07/04/21 0834     Education Details Additions to HEP-see instructions    Person(s) Educated Patient    Methods Explanation;Demonstration;Handout    Comprehension Verbalized understanding;Returned demonstration              PT Short Term Goals - 05/16/21 1149       PT SHORT TERM GOAL #1   Title STGs = LTGs               PT Long Term Goals - 07/04/21 0843       PT LONG TERM GOAL #1   Title Pt will be independent with progression of HEP for improved strength, balance, transfers, and gait.  TARGET 07/04/2021    Baseline Exercises added 07/04/21 and pt return demo understanding    Time 1    Period Weeks    Status Achieved                   Plan - 07/04/21 0843     Clinical Impression Statement Skilled PT session focused today on updates to HEP to include ankle and quad/glut strengthening.  Pt receptive to additions to HEP.  He has difficulty with single limb stance plantarflexion, indicating gastroc weakness; however, he is able to perform alternating heel raises and provided patient with several options to address as well as progression for ankle stregnthening.  Pt feels good about improvements in therapy and is appropriate for d/c from PT at this time.    Personal Factors and Comorbidities Comorbidity 3+    Comorbidities PMH:  PAF, atrial tachycardia s/p cardioversion 11/2019 and ablation 7/21, ankylosing spondylitis, GERD, MVP, OSA    Examination-Activity Limitations Locomotion Level;Transfers;Stand    Examination-Participation Restrictions Community Activity;Other   community exercise, travel   Stability/Clinical Decision Making Evolving/Moderate complexity    Rehab Potential Good    PT Frequency 2x / week    PT Duration Other (comment)   1 week   PT Treatment/Interventions ADLs/Self Care Home Management;Gait training;Functional mobility training;Therapeutic activities;Therapeutic exercise;Balance  training;Neuromuscular re-education;Patient/family education    PT Next Visit Plan D/C this visit.    Consulted and Agree with Plan of Care Patient             Patient will benefit from skilled therapeutic intervention in order to improve the following deficits and impairments:  Abnormal gait, Difficulty walking, Decreased balance, Decreased mobility, Decreased strength  Visit Diagnosis: Muscle weakness (generalized)  Unsteadiness on feet  Other abnormalities of gait and mobility     Problem List Patient Active Problem List   Diagnosis Date Noted   Abnormality of gait 06/26/2021   Chronic hoarseness 10/04/2020   PAF (paroxysmal atrial fibrillation) (HCC)    DDD (degenerative disc disease), lumbar 10/28/2017   Bilateral bunions 08/20/2016   Varicose veins of right lower extremity with complications 62/86/3817   Bruit 10/10/2015   Varicose veins of leg with complications 71/16/5790   Ankylosing spondylitis ([place]) 01/14/2015   Chronic anticoagulation 01/14/2015   Sleep apnea 01/14/2015   S/P ablation of atrial fibrillation 01/08/2015   Persistent atrial fibrillation ([place]) 01/08/2015   Orthostatic dizziness 12/01/2013   Calf pain 11/22/2012   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/22/2012   Peripheral neuropathy 10/21/2012   Pain in joint, ankle and foot 09/21/2012   Disturbance of skin sensation 09/21/2012   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, [place], [place], [address] Phone:  [phone]   Fax:  [phone]  Name: [name] MRN: [id number] Date of Birth: 1941/01/27

## 2021-07-07 ENCOUNTER — Ambulatory Visit: Payer: Medicare Other | Admitting: Physical Therapy

## 2021-07-09 ENCOUNTER — Ambulatory Visit: Payer: Medicare Other | Admitting: Physical Therapy

## 2021-07-09 DIAGNOSIS — M150 Primary generalized (osteo)arthritis: Secondary | ICD-10-CM | POA: Diagnosis not present

## 2021-07-09 DIAGNOSIS — I480 Paroxysmal atrial fibrillation: Secondary | ICD-10-CM | POA: Diagnosis not present

## 2021-07-09 DIAGNOSIS — E663 Overweight: Secondary | ICD-10-CM | POA: Diagnosis not present

## 2021-07-09 DIAGNOSIS — M5136 Other intervertebral disc degeneration, lumbar region: Secondary | ICD-10-CM | POA: Diagnosis not present

## 2021-07-09 DIAGNOSIS — Z6827 Body mass index (BMI) 27.0-27.9, adult: Secondary | ICD-10-CM | POA: Diagnosis not present

## 2021-07-09 DIAGNOSIS — M450 Ankylosing spondylitis of multiple sites in spine: Secondary | ICD-10-CM | POA: Diagnosis not present

## 2021-08-02 DIAGNOSIS — Z20822 Contact with and (suspected) exposure to covid-19: Secondary | ICD-10-CM | POA: Diagnosis not present

## 2021-08-19 DIAGNOSIS — H353212 Exudative age-related macular degeneration, right eye, with inactive choroidal neovascularization: Secondary | ICD-10-CM | POA: Diagnosis not present

## 2021-08-28 ENCOUNTER — Ambulatory Visit (INDEPENDENT_AMBULATORY_CARE_PROVIDER_SITE_OTHER): Payer: Medicare Other | Admitting: Sports Medicine

## 2021-08-28 VITALS — BP 108/76 | Ht 75.0 in | Wt 214.0 lb

## 2021-08-28 DIAGNOSIS — G603 Idiopathic progressive neuropathy: Secondary | ICD-10-CM

## 2021-08-28 DIAGNOSIS — R269 Unspecified abnormalities of gait and mobility: Secondary | ICD-10-CM

## 2021-08-28 NOTE — Progress Notes (Signed)
° °  Wesley [name]. is a 81 y.o. male who presents to [facility] today for the following:  Peripheral neuropathy and gait abnormality f/u Went to neuro rehab and doing home balancing exercises and strengthening exercises Also on gabapentin 300, 300, 300, 600 Overall has noticed significant improvement in his balance He has been very happy with neuro PT He is hoping for a referral to go back so that he can have a reevaluation and see where to go from here Also states that gabapentin has been helpful, but in the late afternoon he starts to have some pain in his feet He denies any side effects from the gabapentin   PMH reviewed.  ROS as above. Medications reviewed.  Exam:  BP 108/76    Ht 6\' 3"  (1.905 m)    Wt 214 lb (97.1 kg)    BMI 26.75 kg/m  Gen: Well NAD MSK:  Gait: Improved gait with improved balance.  He continues to be slightly unsteady.  Appropriate get up and go test.  No results found.   Assessment and Plan: 1) Peripheral neuropathy Given his pain in the late afternoon we will increase his noon dose from 300 to 600 mg.  He will trial 300, 600, 300, 600.  Advised to discontinue if he is having too many side effects with this or if he is not having improvement with this.  Abnormality of gait Making strides with neuro PT and home exercises.  We will have him reevaluated at neuro PT and see if we can progress him further.  After this, we can determine if her former Pilates or PT Pilates may be helpful for him.  Follow-up in 3 months.   Arizona Constable, D.O.  PGY-4 [facility]  08/28/2021 1:59 PM  I observed and examined the patient with the [facility] resident and agree with assessment and plan.  Note reviewed and modified by me. Ila Mcgill, MD

## 2021-08-28 NOTE — Assessment & Plan Note (Signed)
Making strides with neuro PT and home exercises.  We will have him reevaluated at neuro PT and see if we can progress him further.  After this, we can determine if her former Pilates or PT Pilates may be helpful for him.  Follow-up in 3 months.

## 2021-08-28 NOTE — Patient Instructions (Signed)
Thank you for coming to see me today. It was a pleasure. Today we talked about:   You can try increasing your gabapentin to 600 mg at noon to see if this helps with your early afternoon pain.  If this makes you too sleepy or you do not find it helpful after increasing for a few doses, you can stop.  We have placed another referral for neuro PT, they should contact you to schedule an appointment.  Please follow-up with [place] in 3 months or sooner as needed.  If you have any questions or concerns, please do not hesitate to call the office at [phone].  Best,   Arizona Constable, DO [place]

## 2021-08-28 NOTE — Assessment & Plan Note (Signed)
Given his pain in the late afternoon we will increase his noon dose from 300 to 600 mg.  He will trial 300, 600, 300, 600.  Advised to discontinue if he is having too many side effects with this or if he is not having improvement with this.

## 2021-09-10 ENCOUNTER — Ambulatory Visit: Payer: Medicare Other | Attending: Sports Medicine | Admitting: Physical Therapy

## 2021-09-10 ENCOUNTER — Other Ambulatory Visit: Payer: Self-pay

## 2021-09-10 DIAGNOSIS — M6281 Muscle weakness (generalized): Secondary | ICD-10-CM | POA: Insufficient documentation

## 2021-09-10 DIAGNOSIS — R2689 Other abnormalities of gait and mobility: Secondary | ICD-10-CM | POA: Insufficient documentation

## 2021-09-10 NOTE — Therapy (Signed)
[facility] [address] [place], [place], [address] Phone: [phone]   Fax:  [phone]  Physical Therapy Evaluation  Patient Details  Name: Kairon R Dean Goldner. MRN: [id number] Date of Birth: [birth year] Referring Provider (PT): Dr. Stefanie Libel   Encounter Date: 09/10/2021   PT End of Session - 09/10/21 1553     Visit Number 1    Number of Visits 8    Date for PT Re-Evaluation 11/01/21    Authorization Type Medicare/Tricare    PT Start Time 1447    PT Stop Time 1540    PT Time Calculation (min) 53 min    Activity Tolerance Patient tolerated treatment well    Behavior During Therapy WFL for tasks assessed/performed             Past Medical History:  Diagnosis Date   A-fib ([place])    cardioversion x2   Ankylosing spondylitis ([place])    Atrial flutter (HCC)    GERD (gastroesophageal reflux disease)    Hemorrhoids    Irregular heart beat    MVP (mitral valve prolapse)    WITH A MIDSYSTOLIC CLICK   OSA (obstructive sleep apnea)    moderate OSA NPSG 10/24/09 - AHO 21.8/hr   Peripheral neuropathy    Vitamin B12 deficiency     Past Surgical History:  Procedure Laterality Date   ABLATION     CARDIOVERSION N/A 12/20/2019   Procedure: CARDIOVERSION;  Surgeon: Jerline Pain, MD;  Location: [facility];  Service: Cardiovascular;  Laterality: N/A;   COLONOSCOPY     INGUINAL HERNIA REPAIR     venous ligation     for varices left lower leg    There were no vitals filed for this visit.    Subjective Assessment - 09/10/21 1449     Subjective Just went back to Dr. One[place] Alar and told him I'd like to come back for re-eval of therapy.  Been continueing to work on the exercises.  Feel like there is an increase in numbness in toes.    Patient Stated Goals To make sure I'm doing the exercises correctly and check my balance.    Currently in Pain? No/denies                [facility] PT Assessment - 09/10/21 0001        Assessment   Medical Diagnosis idiopathic peripheral neuropathy, abnormality of giat    Referring Provider (PT) Dr. Stefanie Libel    Onset Date/Surgical Date 08/28/21    Hand Dominance Right      Precautions   Precautions Fall      Balance Screen   Has the patient fallen in the past 6 months No    Has the patient had a decrease in activity level because of a fear of falling?  No    Is the patient reluctant to leave their home because of a fear of falling?  No      Home Ec[employer] residence    Living Arrangements Spouse/significant other      Prior Function   Level of [place] Retired    Leisure Enjoys working out at [employer] 2-3x/wk, back stretches, HEP, balance cushion exercises      Observation/Other Assessments   Focus on Therapeutic Outcomes (FOTO)  NA      ROM / Strength   AROM / PROM / Strength Strength  Strength   Strength Assessment Site Hip;Knee;Ankle    Right/Left Hip Right;Left    Right Hip Flexion 5/5    Right Hip Extension 3+/5    Right Hip ABduction 3/5    Left Hip Flexion 5/5    Left Hip Extension 3+/5    Left Hip ABduction 3+/5    Right/Left Knee Right;Left    Right Knee Flexion 4+/5    Right Knee Extension 4+/5    Left Knee Flexion 5/5    Left Knee Extension 5/5    Right/Left Ankle Right;Left    Right Ankle Dorsiflexion 5/5    Right Ankle Plantar Flexion 2/5   completes 2 partial   Right Ankle Inversion 5/5    Right Ankle Eversion 5/5    Left Ankle Dorsiflexion 5/5    Left Ankle Plantar Flexion 3/5   completes 9 full   Left Ankle Inversion 5/5    Left Ankle Eversion 5/5      Transfers   Transfers Sit to Stand;Stand to Sit    Sit to Stand 6: Modified independent (Device/Increase time);From chair/3-in-1;Without upper extremity assist    Five time sit to stand comments  9.16    Stand to Sit 6: Modified independent (Device/Increase time);Without upper extremity assist;To  chair/3-in-1      Ambulation/Gait   Ambulation/Gait Yes    Ambulation/Gait Assistance 6: Modified independent (Device/Increase time)    Ambulation Distance (Feet) 60 Feet   x 4   Assistive device None    Gait Pattern Step-through pattern;Trendelenburg    Ambulation Surface Level;Indoor    Gait velocity 8.68 sec= 3.78 ft/sec      High Level Balance   High Level Balance Comments SLS:  RLE < 1 sec; LLE 1.3 sec at best.  Standing EO and EC on solid surface x 30 seconds      Functional Gait  Assessment   Gait assessed  Yes    Gait Level Surface Walks 20 ft in less than 7 sec but greater than 5.5 sec, uses assistive device, slower speed, mild gait deviations, or deviates 6-10 in outside of the 12 in walkway width.   6.03   Change in Gait Speed Able to smoothly change walking speed without loss of balance or gait deviation. Deviate no more than 6 in outside of the 12 in walkway width.    Gait with Horizontal Head Turns Performs head turns smoothly with no change in gait. Deviates no more than 6 in outside 12 in walkway width   5.6   Gait with Vertical Head Turns Performs head turns with no change in gait. Deviates no more than 6 in outside 12 in walkway width.   5.43   Gait and Pivot Turn Pivot turns safely within 3 sec and stops quickly with no loss of balance.    Step Over Obstacle Is able to step over one shoe box (4.5 in total height) without changing gait speed. No evidence of imbalance.    Gait with Narrow Base of Support Ambulates 7-9 steps.    Gait with Eyes Closed Walks 20 ft, slow speed, abnormal gait pattern, evidence for imbalance, deviates 10-15 in outside 12 in walkway width. Requires more than 9 sec to ambulate 20 ft.   10.82   Ambulating Backwards Walks 20 ft, no assistive devices, good speed, no evidence for imbalance, normal gait   13.47   Steps Alternating feet, must use rail.    Total Score 24    FGA comment: Scores <22/30 indicate increased  fall risk.                         Objective measurements completed on examination: See above findings.          Therapeutic Exercise  Standing hip abduction, resisted green theraband, 10 reps, then 5 reps.  Cues to be tall through stance limb throughout and to be steady through trunk Standing hip extension, resisted green theraband, 5 reps.  Cues for tall stance limb.  Cues to take out monster walk and resisted sidestepping from HEP.      PT Education - 09/10/21 1538     Education Details Eval results; updates to pt's current HEP    Person(s) Educated Patient    Methods Explanation;Demonstration;Handout;Verbal cues    Comprehension Verbalized understanding;Returned demonstration              PT Short Term Goals - 09/10/21 1559       PT SHORT TERM GOAL #1   Title Pt will be independent with HEP for improved strength, balance, and gait.  TARGET3/04/2022    Time 4    Period Weeks    Status New               PT Long Term Goals - 09/10/21 1600       PT LONG TERM GOAL #1   Title Pt will be independent with progression of HEP for improved strength, balance, and gait.  TARGET 10/31/2021    Time 8    Period Weeks    Status New      PT LONG TERM GOAL #2   Title Pt will improve single limb stance to at least 3 sec each leg for improved hip stability.    Baseline 1 sec or less multiple trials    Time 8    Period Weeks    Status New      PT LONG TERM GOAL #3   Title Pt will improve hip abductor and extensor strength to at least 3+/5 on RLE and 4/5 on LLE to improve single limb stance and gait mechanics.    Time 8    Period Weeks    Status New                    Plan - 09/10/21 1554     Clinical Impression Statement Pt is a 81 year old male who presents to OPPT with idiopathic peripheral neuropathy, abnormality of gait.  He comes as a request for re-evaluation from Dr. [place] Alar, after his d/c from PT in December 2022.  Pt verbalizes continued performance of  HEP, and he demonstrates improved measures on 5x sit<>stand, gait velocity, and FGA scores today compared to d/c.  With specific MMT, he does demonstrate muscle weakness in hip abductors and hip extensors and with gait, he is noted to have wide BOS and Trendelenburg pattern; also pt has decreased single limb stance each leg (1 second or less), indicating decreased hip strength.  He would benefit from skilled PT to address the deficits noted above-particularly strength, gait and high level balance to improve overall functional mobility and decreased fall risk.    Comorbidities PMH:  PAF, atrial tachycardia s/p cardioversion 11/2019 and ablation 7/21, ankylosing spondylitis, GERD, MVP, OSA    Examination-Activity Limitations Locomotion Level;Transfers;Stand    Examination-Participation Restrictions Community Activity;Other   community, exercise, travel, walking his dog   Stability/Clinical Decision Making Evolving/Moderate complexity    Clinical Decision Making Moderate  Rehab Potential Good    PT Frequency 1x / week    PT Duration 8 weeks   including eval   PT Treatment/Interventions ADLs/Self Care Home Management;Gait training;Functional mobility training;Therapeutic activities;Therapeutic exercise;Balance training;Neuromuscular re-education;Patient/family education    PT Next Visit Plan Review HEP additions; work on hip stability and hip strength-step ups, single limb stance.    Consulted and Agree with Plan of Care Patient             Patient will benefit from skilled therapeutic intervention in order to improve the following deficits and impairments:  Abnormal gait, Decreased balance, Decreased mobility, Decreased strength  Visit Diagnosis: Muscle weakness (generalized)  Other abnormalities of gait and mobility     Problem List Patient Active Problem List   Diagnosis Date Noted   Abnormality of gait 06/26/2021   Chronic hoarseness 10/04/2020   PAF (paroxysmal atrial  fibrillation) (HCC)    DDD (degenerative disc disease), lumbar 10/28/2017   Bilateral bunions 08/20/2016   Varicose veins of right lower extremity with complications 09/38/1829   Bruit 10/10/2015   Varicose veins of leg with complications 93/71/6967   Ankylosing spondylitis ([place]) 01/14/2015   Chronic anticoagulation 01/14/2015   Sleep apnea 01/14/2015   S/P ablation of atrial fibrillation 01/08/2015   Persistent atrial fibrillation ([place]) 01/08/2015   Orthostatic dizziness 12/01/2013   Calf pain 11/22/2012   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/22/2012   Peripheral neuropathy 10/21/2012   Pain in joint, ankle and foot 09/21/2012   Disturbance of skin sensation 09/21/2012   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 [place], [place], [address] Phone: [phone]   Fax:  [phone]  Name: [name] MRN: [id number] Date of Birth: June 29, 1941

## 2021-09-10 NOTE — Patient Instructions (Signed)
Access Code: [email or web address] URL: [email or web address] Date: 09/10/2021 Prepared by: [facility]  Exercises Romberg Stance on Foam Pad - 1 x daily - 5 x weekly - 1 sets - 10 reps Standing Romberg to 1/2 Tandem Stance - 1 x daily - 5 x weekly - 1 sets - 10 reps Seated Hamstring Stretch - 1-2 x daily - 7 x weekly - 1 sets - 3 reps - 30 sec hold Alternating Heel Raises - 1 x daily - 5 x weekly - 2-3 sets - 10 reps Staggered Stance Forward Backward Weight Shift with Counter Support - 1 x daily - 5 x weekly - 2-3 sets - 10 reps Single Leg Heel Raise with Counter Support - 1 x daily - 5 x weekly - 2 sets - 10 reps - 3 sec hold Mini Squat with Counter Support - 1 x daily - 5 x weekly - 2 sets - 10 reps Standing Hip Abduction with Resistance at Ankles and Counter Support - 1 x daily - 5 x weekly - 3 sets - 10 reps Standing Hip Extension with Resistance at Ankles and Counter Support - 1 x daily - 5 x weekly - 3 sets - 10 reps

## 2021-09-17 ENCOUNTER — Other Ambulatory Visit: Payer: Self-pay

## 2021-09-17 ENCOUNTER — Ambulatory Visit: Payer: Medicare Other | Admitting: Physical Therapy

## 2021-09-17 ENCOUNTER — Encounter: Payer: Self-pay | Admitting: Physical Therapy

## 2021-09-17 DIAGNOSIS — R2689 Other abnormalities of gait and mobility: Secondary | ICD-10-CM

## 2021-09-17 DIAGNOSIS — M6281 Muscle weakness (generalized): Secondary | ICD-10-CM

## 2021-09-17 NOTE — Therapy (Signed)
[facility] [address], [place], [place], [address] Phone: [phone]   Fax:  [phone]  Physical Therapy Treatment  Patient Details  Name: Wesley [name]. MRN: [id number] Date of Birth: 08-15-1940 Referring Provider (PT): Dr. Stefanie Libel   Encounter Date: 09/17/2021   PT End of Session - 09/17/21 1404     Visit Number 2    Number of Visits 8    Date for PT Re-Evaluation 11/01/21    Authorization Type Medicare/Tricare    PT Start Time 1404    PT Stop Time 1446    PT Time Calculation (min) 42 min    Activity Tolerance Patient tolerated treatment well    Behavior During Therapy WFL for tasks assessed/performed             Past Medical History:  Diagnosis Date   A-fib ([place])    cardioversion x2   Ankylosing spondylitis ([place])    Atrial flutter ([facility])    GERD (gastroesophageal reflux disease)    Hemorrhoids    Irregular heart beat    MVP (mitral valve prolapse)    WITH A MIDSYSTOLIC CLICK   OSA (obstructive sleep apnea)    moderate OSA NPSG 10/24/09 - AHO 21.8/hr   Peripheral neuropathy    Vitamin B12 deficiency     Past Surgical History:  Procedure Laterality Date   ABLATION     CARDIOVERSION N/A 12/20/2019   Procedure: CARDIOVERSION;  Surgeon: Jerline Pain, MD;  Location: [facility];  Service: Cardiovascular;  Laterality: N/A;   COLONOSCOPY     INGUINAL HERNIA REPAIR     venous ligation     for varices left lower leg    There were no vitals filed for this visit.   Subjective Assessment - 09/17/21 1404     Subjective Been working on the exercises.  Working up to 2 sets of 10 of the resisted exercises.    Patient Stated Goals To make sure I'm doing the exercises correctly and check my balance.    Currently in Pain? No/denies   occasional pain in toes, back (history of arthritis)                        Therapeutic Exercise-Reviewed HEP from last visit, with pt return demo  understanding.   Standing hip abduction, resisted green theraband, 10 reps, then 5 reps.  Cues to be tall through stance limb throughout and to be steady through trunk with cues for core stability. Standing hip extension, resisted green theraband, 10 reps.  Cues for tall stance limb.        [place] Adult PT Treatment/Exercise - 09/17/21 0001       Ambulation/Gait   Ambulation/Gait Yes    Ambulation/Gait Assistance 6: Modified independent (Device/Increase time)    Ambulation Distance (Feet) 60 Feet   x 4   Assistive device None    Gait Pattern Step-through pattern;Trendelenburg    Ambulation Surface Level;Indoor    Gait Comments Cues for abdominal activation-tall through trunk and hips with gait.      Knee/Hip Exercises: Standing   Lateral Step Up Right;Left;1 set;10 reps;Hand Hold: 1;Step Height: 6"    Forward Step Up Right;Left;1 set;10 reps;Hand Hold: 2;Step Height: 6"    SLS with Vectors SLS with rolling green weighted ball fwd/back x 10, then rolling lightweight ball fwd/back x 10.  Cues for abdominal activation and being tall through stance hip.  SLS with step  taps to 2 cones, then to 3 cones, light UE support.    Other Standing Knee Exercises Resisted side step R and L, green theraband, 10 reps.      Knee/Hip Exercises: Prone   Other Prone Exercises Attempted quadruped hip extension kicks x 5 reps, then attempted hip extension with flexed knee, 3 reps R and 1 rep L, with difficulty due to pain in shoulder and wrists.    Other Prone Exercises Modified quadruped standing at counter with flexed knee hip extension, 5 reps x 2 sets.                 Balance Exercises - 09/17/21 0001       Balance Exercises: Standing   SLS with Vectors --    SLS with Vectors Limitations --                  PT Short Term Goals - 09/10/21 1559       PT SHORT TERM GOAL #1   Title Pt will be independent with HEP for improved strength, balance, and gait.  TARGET3/04/2022    Time  4    Period Weeks    Status New               PT Long Term Goals - 09/10/21 1600       PT LONG TERM GOAL #1   Title Pt will be independent with progression of HEP for improved strength, balance, and gait.  TARGET 10/31/2021    Time 8    Period Weeks    Status New      PT LONG TERM GOAL #2   Title Pt will improve single limb stance to at least 3 sec each leg for improved hip stability.    Baseline 1 sec or less multiple trials    Time 8    Period Weeks    Status New      PT LONG TERM GOAL #3   Title Pt will improve hip abductor and extensor strength to at least 3+/5 on RLE and 4/5 on LLE to improve single limb stance and gait mechanics.    Time 8    Period Weeks    Status New                   Plan - 09/17/21 1559     Clinical Impression Statement Reviewed HEP this visit, with pt responding well to cues for abdominal activation and glut activation with the resisted hip abduction and hip extension exercises.  Continued to address strengthening as well as core stability in standing and quadruped/modified quadruped exercises.  He will continue to benefit from skilled PT towards LTGs for improved functional mobility and gait mechanics.    Comorbidities PMH:  PAF, atrial tachycardia s/p cardioversion 11/2019 and ablation 7/21, ankylosing spondylitis, GERD, MVP, OSA    Examination-Activity Limitations Locomotion Level;Transfers;Stand    Examination-Participation Restrictions Community Activity;Other   community, exercise, travel, walking his dog   Stability/Clinical Decision Making Evolving/Moderate complexity    Rehab Potential Good    PT Frequency 1x / week    PT Duration 8 weeks   including eval   PT Treatment/Interventions ADLs/Self Care Home Management;Gait training;Functional mobility training;Therapeutic activities;Therapeutic exercise;Balance training;Neuromuscular re-education;Patient/family education    PT Next Visit Plan Add step ups (forward and lateral) to  HEP;  work on hip stability and hip strength-step ups, single limb stance.    Consulted and Agree with Plan of Care Patient  Patient will benefit from skilled therapeutic intervention in order to improve the following deficits and impairments:  Abnormal gait, Decreased balance, Decreased mobility, Decreased strength  Visit Diagnosis: Muscle weakness (generalized)  Other abnormalities of gait and mobility     Problem List Patient Active Problem List   Diagnosis Date Noted   Abnormality of gait 06/26/2021   Chronic hoarseness 10/04/2020   PAF (paroxysmal atrial fibrillation) (HCC)    DDD (degenerative disc disease), lumbar 10/28/2017   Bilateral bunions 08/20/2016   Varicose veins of right lower extremity with complications 27/51/7001   Bruit 10/10/2015   Varicose veins of leg with complications 74/94/4967   Ankylosing spondylitis ([place]) 01/14/2015   Chronic anticoagulation 01/14/2015   Sleep apnea 01/14/2015   S/P ablation of atrial fibrillation 01/08/2015   Persistent atrial fibrillation ([place]) 01/08/2015   Orthostatic dizziness 12/01/2013   Calf pain 11/22/2012   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/22/2012   Peripheral neuropathy 10/21/2012   Pain in joint, ankle and foot 09/21/2012   Disturbance of skin sensation 09/21/2012   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 ([place]) 09/21/2012   Other general symptoms(780.99) 09/21/2012   Heel pain 06/07/2012   Plantar fasciitis 06/07/2012   Mitral valve prolapse 06/03/2011   Arthritis pain of shoulder 04/03/2011   Rotator cuff syndrome of right shoulder 04/03/2011   Paroxysmal atrial fibrillation ([place]) 01/20/2011   Hypercholesterolemia 01/20/2011   OBSTRUCTIVE SLEEP APNEA 10/08/2009    Breanah Faddis W., PT 09/17/2021, 4:03 PM  [facility] [address], [place], [place], [address] Phone: [phone]   Fax:  [phone]  Name: Wesley Chris. MRN:  [id number] Date of Birth: [birth year]

## 2021-09-24 ENCOUNTER — Ambulatory Visit: Payer: Medicare Other | Attending: Sports Medicine | Admitting: Physical Therapy

## 2021-09-24 ENCOUNTER — Other Ambulatory Visit: Payer: Self-pay

## 2021-09-24 ENCOUNTER — Encounter: Payer: Self-pay | Admitting: Physical Therapy

## 2021-09-24 DIAGNOSIS — M6281 Muscle weakness (generalized): Secondary | ICD-10-CM | POA: Insufficient documentation

## 2021-09-24 DIAGNOSIS — R2689 Other abnormalities of gait and mobility: Secondary | ICD-10-CM | POA: Insufficient documentation

## 2021-09-24 DIAGNOSIS — R2681 Unsteadiness on feet: Secondary | ICD-10-CM | POA: Diagnosis not present

## 2021-09-24 NOTE — Therapy (Signed)
[facility] ?[place] [address], [address] ?[place], [place], [address] ?Phone: [phone]   Fax:  [phone] ? ?Physical Therapy Treatment ? ?Patient Details  ?Name: Wesley [name]. ?MRN: [id number] ?Date of Birth: 1940/08/08 ?Referring Provider (PT): Dr. Stefanie Libel ? ? ?Encounter Date: 09/24/2021 ? ? PT End of Session - 09/24/21 1546   ? ? Visit Number 3   ? Number of Visits 8   ? Date for PT Re-Evaluation 11/01/21   ? Authorization Type Medicare/Tricare   ? PT Start Time 1450   ? PT Stop Time 1531   ? PT Time Calculation (min) 41 min   ? Activity Tolerance Patient tolerated treatment well   ? Behavior During Therapy [facility] for tasks assessed/performed   ? ?  ?  ? ?  ? ? ?Past Medical History:  ?Diagnosis Date  ? A-fib ([place])   ? cardioversion x2  ? Ankylosing spondylitis ([place])   ? Atrial flutter ([place])   ? GERD (gastroesophageal reflux disease)   ? Hemorrhoids   ? Irregular heart beat   ? MVP (mitral valve prolapse)   ? WITH A MIDSYSTOLIC CLICK  ? OSA (obstructive sleep apnea)   ? moderate OSA NPSG 10/24/09 - AHO 21.8/hr  ? Peripheral neuropathy   ? Vitamin B12 deficiency   ? ? ?Past Surgical History:  ?Procedure Laterality Date  ? ABLATION    ? CARDIOVERSION N/A 12/20/2019  ? Procedure: CARDIOVERSION;  Surgeon: Jerline Pain, MD;  Location: [facility] ENDOSCOPY;  Service: Cardiovascular;  Laterality: N/A;  ? COLONOSCOPY    ? INGUINAL HERNIA REPAIR    ? venous ligation    ? for varices left lower leg  ? ? ?There were no vitals filed for this visit. ? ? Subjective Assessment - 09/24/21 1451   ? ? Subjective Had some back pain after doing the 30 reps of the resisted hip abduction.   ? Patient Stated Goals To make sure I'm doing the exercises correctly and check my balance.   ? Currently in Pain? No/denies   ? ?  ?  ? ?  ? ? ? ? ? ? ? ? ? ? ? ? ? ? ? ? ? ? ? ? [place] Adult PT Treatment/Exercise - 09/24/21 0001   ? ?  ? Knee/Hip Exercises: Standing  ? Hip Abduction Stengthening;Right;Left;2 sets;10  reps;Limitations   performed as step taps, cues for technique and trunk control  ? Abduction Limitations no resistance x 2 sets; 3rd set resisted step tap with red theraband   ? Hip Extension Stengthening;Right;Left;1 set;10 reps;Limitations   ? Extension Limitations red theraband, step taps, cues for upright trunk   ? Lateral Step Up Right;Left;1 set;10 reps;Hand Hold: 1;Step Height: 6"   ? Forward Step Up Right;Left;10 reps;Hand Hold: 2;Step Height: 6";2 sets   ? Functional Squat 1 set;10 reps;Limitations   ? Functional Squat Limitations squat to up on toes x 10 reps   ? SLS with Vectors SLS with rolling lightweight ball fwd/back x 10.  Cues for abdominal activation and being tall through stance hip.  SLS with step taps to 2 cones, light UE support   ? Other Standing Knee Exercises Standing sideways one foot on step, tap down to floor, then up in air, repeated 10 times each leg with UE support.   ? Other Standing Knee Exercises Sit to stand with glut activation upon standing x 5 reps.; Reviewed stagger standing forward/back rocking x 10 reps each side,  focus on active ankle dorsiflexion and plantarflexion; reviewed alternating toe raises for improved isolation of plantarflexion.   ? ?  ?  ? ?  ? ? ? ? ? ? ? ? ? ? PT Education - 09/24/21 1543   ? ? Education Details Review of HEP and updates to HEP; reviewed some exercises per pt questions to make sure performing correctly; provided modification for resisted hip abduction/extension wtih step taps and attention to tall, upright posture through midline to avoid excess trunk motion (due to pt's c/o back pain after last session)   ? Person(s) Educated Patient   ? Methods Explanation;Demonstration;Tactile cues;Handout   ? Comprehension Verbalized understanding;Returned demonstration   ? ?  ?  ? ?  ? ? ? PT Short Term Goals - 09/10/21 1559   ? ?  ? PT SHORT TERM GOAL #1  ? Title Pt will be independent with HEP for improved strength, balance, and gait.  TARGET3/04/2022    ? Time 4   ? Period Weeks   ? Status New   ? ?  ?  ? ?  ? ? ? ? PT Long Term Goals - 09/10/21 1600   ? ?  ? PT LONG TERM GOAL #1  ? Title Pt will be independent with progression of HEP for improved strength, balance, and gait.  TARGET 10/31/2021   ? Time 8   ? Period Weeks   ? Status New   ?  ? PT LONG TERM GOAL #2  ? Title Pt will improve single limb stance to at least 3 sec each leg for improved hip stability.   ? Baseline 1 sec or less multiple trials   ? Time 8   ? Period Weeks   ? Status New   ?  ? PT LONG TERM GOAL #3  ? Title Pt will improve hip abductor and extensor strength to at least 3+/5 on RLE and 4/5 on LLE to improve single limb stance and gait mechanics.   ? Time 8   ? Period Weeks   ? Status New   ? ?  ?  ? ?  ? ? ? ? ? ? ? ? Plan - 09/24/21 1547   ? ? Clinical Impression Statement Continued to review and update HEP to address glut strengthening for hip stabilization in static and dynamic standing.  Pt reports having had some back pain after less sesion and performing resisted hip abduction/extension at home, that has subsided; made modifications today with changing to step taps and use of red theraband, to promote hip strenghtening with attention to trunk stability during these exercises.  Cues with various exercises to perform through full range and hold briefly at end range to maximize muscle activation.   ? Comorbidities PMH:  PAF, atrial tachycardia s/p cardioversion 11/2019 and ablation 7/21, ankylosing spondylitis, GERD, MVP, OSA   ? Examination-Activity Limitations Locomotion Level;Transfers;Stand   ? Examination-Participation Restrictions Community Activity;Other   community, exercise, travel, walking his dog  ? Stability/Clinical Decision Making Evolving/Moderate complexity   ? Rehab Potential Good   ? PT Frequency 1x / week   ? PT Duration 8 weeks   including eval  ? PT Treatment/Interventions ADLs/Self Care Home Management;Gait training;Functional mobility training;Therapeutic  activities;Therapeutic exercise;Balance training;Neuromuscular re-education;Patient/family education   ? PT Next Visit Plan Review HEP additions; work on hip stability and hip strength-step ups, single limb stance.   ? Consulted and Agree with Plan of Care Patient   ? ?  ?  ? ?  ? ? ?  Patient will benefit from skilled therapeutic intervention in order to improve the following deficits and impairments:  Abnormal gait, Decreased balance, Decreased mobility, Decreased strength ? ?Visit Diagnosis: ?Muscle weakness (generalized) ? ?Other abnormalities of gait and mobility ? ? ? ? ?Problem List ?Patient Active Problem List  ? Diagnosis Date Noted  ? Abnormality of gait 06/26/2021  ? Chronic hoarseness 10/04/2020  ? PAF (paroxysmal atrial fibrillation) ([place])   ? DDD (degenerative disc disease), lumbar 10/28/2017  ? Bilateral bunions 08/20/2016  ? Varicose veins of right lower extremity with complications 56/38/7564  ? Bruit 10/10/2015  ? Varicose veins of leg with complications 33/29/5188  ? Ankylosing spondylitis ([place]) 01/14/2015  ? Chronic anticoagulation 01/14/2015  ? Sleep apnea 01/14/2015  ? S/P ablation of atrial fibrillation 01/08/2015  ? Persistent atrial fibrillation ([place]) 01/08/2015  ? Orthostatic dizziness 12/01/2013  ? Calf pain 11/22/2012  ? 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/22/2012  ? Peripheral neuropathy 10/21/2012  ? Pain in joint, ankle and foot 09/21/2012  ? Disturbance of skin sensation 09/21/2012  ? 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 ([place]) 09/21/2012  ? Other general symptoms(780.99) 09/21/2012  ? Heel pain 06/07/2012  ? Plantar fasciitis 06/07/2012  ? Mitral valve prolapse 06/03/2011  ? Arthritis pain of shoulder 04/03/2011  ? Rotator cuff syndrome of right shoulder 04/03/2011  ? Paroxysmal atrial fibrillation ([place]) 01/20/2011  ? Hypercholesterolemia 01/20/2011  ? OBSTRUCTIVE SLEEP APNEA 10/08/2009  ? ? ?Wesley [name] W., PT ?09/24/2021, 3:52 PM ? ?[facility] ?[address], [address] ?[place], [place], [address] ?Phone: [phone]   Fax:  [phone] ? ?Name: Wesley [name]. ?MRN: [id number] ?Date of Birth: 04/16/1941 ? ? ? ?

## 2021-09-24 NOTE — Patient Instructions (Signed)
Access Code: [email or web address] ?URL: [email or web address] ?Date: 09/24/2021 ?Prepared by: [facility] ? ?Program Notes ?Provided glut med/standing on step with opposite foot in air-tapping to floor, back into air, 10 reps x 2.  Repeat other leg. ? ? ?Exercises ?Seated Hamstring Stretch - 2 x daily - 7 x weekly - 1 sets - 3 reps - 30 seconds hold ?Sit to stand in stride stance - 1 x daily - 5 x weekly - 1 sets - 5 reps ?Standing Gastroc Stretch at [employer] - 2 x daily - 7 x weekly - 1 sets - 3 reps - 30 seconds hold ?Standing Hip Abduction with Counter Support - 1 x daily - 5 x weekly - 2 sets - 10 reps ?Standing Hip Extension with Counter Support - 1 x daily - 5 x weekly - 1-2 sets - 10 reps ?Heel Raises with Counter Support - 2 x daily - 7 x weekly - 1 sets - 10-15 reps ?Mini Squat with Counter Support - 1 x daily - 5 x weekly - 2 sets - 10 reps ? ?

## 2021-09-30 DIAGNOSIS — L280 Lichen simplex chronicus: Secondary | ICD-10-CM | POA: Diagnosis not present

## 2021-09-30 DIAGNOSIS — L728 Other follicular cysts of the skin and subcutaneous tissue: Secondary | ICD-10-CM | POA: Diagnosis not present

## 2021-09-30 DIAGNOSIS — L821 Other seborrheic keratosis: Secondary | ICD-10-CM | POA: Diagnosis not present

## 2021-10-02 ENCOUNTER — Other Ambulatory Visit: Payer: Self-pay

## 2021-10-02 ENCOUNTER — Encounter: Payer: Self-pay | Admitting: Internal Medicine

## 2021-10-02 ENCOUNTER — Ambulatory Visit: Payer: Medicare Other | Admitting: Physical Therapy

## 2021-10-02 ENCOUNTER — Encounter: Payer: Self-pay | Admitting: Physical Therapy

## 2021-10-02 DIAGNOSIS — R2681 Unsteadiness on feet: Secondary | ICD-10-CM | POA: Diagnosis not present

## 2021-10-02 DIAGNOSIS — M6281 Muscle weakness (generalized): Secondary | ICD-10-CM

## 2021-10-02 DIAGNOSIS — R2689 Other abnormalities of gait and mobility: Secondary | ICD-10-CM | POA: Diagnosis not present

## 2021-10-03 NOTE — Therapy (Signed)
[place] [facility] ?[address], [address] ?[place], [place], [address] ?Phone: [phone]   Fax:  [phone] ? ?Physical Therapy Treatment ? ?Patient Details  ?Name: Wesley [name]. ?MRN: [id number] ?Date of Birth: 1940-12-12 ?Referring Provider (PT): Dr. Stefanie Libel ? ? ?Encounter Date: 10/02/2021 ? ? PT End of Session - 10/02/21 1448   ? ? Visit Number 4   ? Number of Visits 8   ? Date for PT Re-Evaluation 11/01/21   ? Authorization Type Medicare/Tricare   ? PT Start Time 1448   ? PT Stop Time 1528   ? PT Time Calculation (min) 40 min   ? Activity Tolerance Patient tolerated treatment well   ? Behavior During Therapy [facility] for tasks assessed/performed   ? ?  ?  ? ?  ? ? ?Past Medical History:  ?Diagnosis Date  ? A-fib ([place])   ? cardioversion x2  ? Ankylosing spondylitis ([place])   ? Atrial flutter ([place])   ? GERD (gastroesophageal reflux disease)   ? Hemorrhoids   ? Irregular heart beat   ? MVP (mitral valve prolapse)   ? WITH A MIDSYSTOLIC CLICK  ? OSA (obstructive sleep apnea)   ? moderate OSA NPSG 10/24/09 - AHO 21.8/hr  ? Peripheral neuropathy   ? Vitamin B12 deficiency   ? ? ?Past Surgical History:  ?Procedure Laterality Date  ? ABLATION    ? CARDIOVERSION N/A 12/20/2019  ? Procedure: CARDIOVERSION;  Surgeon: Jerline Pain, MD;  Location: [facility] ENDOSCOPY;  Service: Cardiovascular;  Laterality: N/A;  ? COLONOSCOPY    ? INGUINAL HERNIA REPAIR    ? venous ligation    ? for varices left lower leg  ? ? ?There were no vitals filed for this visit. ? ? Subjective Assessment - 10/02/21 1448   ? ? Subjective I wonder if something from the fusion is causing the weakness on R side. Have a little bit of soreness in my hip from the resisted bands.  Will stay at 2 sets of 10 with red band.   ? Patient Stated Goals To make sure I'm doing the exercises correctly and check my balance.   ? Currently in Pain? No/denies   ? Pain Score --   very low rated pain in R hip  ? ?  ?  ? ?   ? ? ? ? ? ? ? ? ? ? ? ? ? ? ? ? ? ? ? ? [facility] Adult PT Treatment/Exercise - 10/02/21 1450   ? ?  ? Exercises  ? Exercises Other Exercises   ? Other Exercises  Core stability activation:  seated abdominal activation wtih alt UE lifts x 5; alt leg lifts x 10 reps, alt LAQ x 10; opposite arm/leg lifts 2 x 5 reps.   ?  ? Knee/Hip Exercises: Standing  ? Functional Squat 10 reps;Limitations;2 sets   ? Functional Squat Limitations then up on toes   on Airex for 2nd set  ? SLS 3 reps each side, 1-3 seconds; 2 sets; SLS with light UE support, 3 x 10 sec   ? SLS with Vectors Standing on Airex with alt forward step taps to 12" step, then 6/12/6" step, then forward step tap>hip/knee extension into runner's stretch position.  All to address static/dynamic hip stability, SLS on compliant surface.   ? Other Standing Knee Exercises Reviewed addition to HEP last visit:  Standing sideways one foot on step, tap down to floor, then up in air, repeated  10 times each leg with UE support.   ? Other Standing Knee Exercises Standing on Airex:  foot propped on step with alt UE lifts, then bilat UE lifts with 3# weights, then bilat UE rotation side to side with 3# weights, cues for abdominal activation   ? ?  ?  ? ?  ? ? ? ? ? ? ? ? ? ? PT Education - 10/03/21 0939   ? ? Education Details Continueing exercises at home to continue to address hip stability and strength; activation of core muscles with standing and seated activities   ? Person(s) Educated Patient   ? Methods Explanation;Demonstration   ? Comprehension Verbalized understanding   ? ?  ?  ? ?  ? ? ? PT Short Term Goals - 10/03/21 0943   ? ?  ? PT SHORT TERM GOAL #1  ? Title Pt will be independent with HEP for improved strength, balance, and gait.  TARGET3/04/2022   ? Time 4   ? Period Weeks   ? Status Achieved   ? ?  ?  ? ?  ? ? ? ? PT Long Term Goals - 10/03/21 0943   ? ?  ? PT LONG TERM GOAL #1  ? Title Pt will be independent with progression of HEP for improved strength,  balance, and gait.  TARGET 10/31/2021   ? Time 8   ? Period Weeks   ? Status On-going   ?  ? PT LONG TERM GOAL #2  ? Title Pt will improve single limb stance to at least 3 sec each leg for improved hip stability.   ? Baseline 1 sec or less multiple trials   ? Time 8   ? Period Weeks   ? Status On-going   ?  ? PT LONG TERM GOAL #3  ? Title Pt will improve hip abductor and extensor strength to at least 3+/5 on RLE and 4/5 on LLE to improve single limb stance and gait mechanics.   ? Time 8   ? Period Weeks   ? Status On-going   ? ?  ?  ? ?  ? ? ? ? ? ? ? ? Plan - 10/03/21 0940   ? ? Clinical Impression Statement Skilled PT session focused on core activation/stability with seated and standing exercises as well as continued work on single limb stance, hip stability standing on solid and compliant surfaces.  Pt continues to require light UE support for single limb stance activities; he is performing HEP at home consistently and will continue to benefit from skilled PT to further address balance, strength and stability through hip and ankle musculature.   ? Comorbidities PMH:  PAF, atrial tachycardia s/p cardioversion 11/2019 and ablation 7/21, ankylosing spondylitis, GERD, MVP, OSA   ? Examination-Activity Limitations Locomotion Level;Transfers;Stand   ? Examination-Participation Restrictions Community Activity;Other   community, exercise, travel, walking his dog  ? Stability/Clinical Decision Making Evolving/Moderate complexity   ? Rehab Potential Good   ? PT Frequency 1x / week   ? PT Duration 8 weeks   including eval  ? PT Treatment/Interventions ADLs/Self Care Home Management;Gait training;Functional mobility training;Therapeutic activities;Therapeutic exercise;Balance training;Neuromuscular re-education;Patient/family education   ? PT Next Visit Plan work on hip stability and hip strength-step ups, single limb stance.   ? Consulted and Agree with Plan of Care Patient   ? ?  ?  ? ?  ? ? ?Patient will benefit from skilled  therapeutic intervention in order to improve the following deficits  and impairments:  Abnormal gait, Decreased balance, Decreased mobility, Decreased strength ? ?Visit Diagnosis: ?Muscle weakness (generalized) ? ?Unsteadiness on feet ? ? ? ? ?Problem List ?Patient Active Problem List  ? Diagnosis Date Noted  ? Abnormality of gait 06/26/2021  ? Chronic hoarseness 10/04/2020  ? PAF (paroxysmal atrial fibrillation) ([place])   ? DDD (degenerative disc disease), lumbar 10/28/2017  ? Bilateral bunions 08/20/2016  ? Varicose veins of right lower extremity with complications 82/95/6213  ? Bruit 10/10/2015  ? Varicose veins of leg with complications 08/65/7846  ? Ankylosing spondylitis ([place]) 01/14/2015  ? Chronic anticoagulation 01/14/2015  ? Sleep apnea 01/14/2015  ? S/P ablation of atrial fibrillation 01/08/2015  ? Persistent atrial fibrillation ([place]) 01/08/2015  ? Orthostatic dizziness 12/01/2013  ? Calf pain 11/22/2012  ? 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/22/2012  ? Peripheral neuropathy 10/21/2012  ? Pain in joint, ankle and foot 09/21/2012  ? Disturbance of skin sensation 09/21/2012  ? 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 ([place]) 09/21/2012  ? Other general symptoms(780.99) 09/21/2012  ? Heel pain 06/07/2012  ? Plantar fasciitis 06/07/2012  ? Mitral valve prolapse 06/03/2011  ? Arthritis pain of shoulder 04/03/2011  ? Rotator cuff syndrome of right shoulder 04/03/2011  ? Paroxysmal atrial fibrillation ([place]) 01/20/2011  ? Hypercholesterolemia 01/20/2011  ? OBSTRUCTIVE SLEEP APNEA 10/08/2009  ? ? ?Mahika Vanvoorhis W., PT ?10/03/2021, 9:44 AM ? ?[place] [facility] ?[address], [address] ?[place], [place], [address] ?Phone: [phone]   Fax:  [phone] ? ?Name: Wesley [name]. ?MRN: [id number] ?Date of Birth: 11/15/1940 ? ? ? ?

## 2021-10-03 NOTE — Progress Notes (Deleted)
?HPI ? male former smoker followed for OSA, complicated by A. fib/ablation/Eliquis, peripheral neuropathy ?NPSG 10/24/09-AHI 21.8/hour, desat to 86%, body weight 230 pounds ?HST-07/06/2018-AHI 17.5/hour, desaturation to 82%, body weight 216 pound ? ?----------------------------------------------------------------------------------------------- ? ? ?11/10/2018- Virtual Visit via Telephone Note ? ?I connected with Wesley [name]. on 11/10/18 at  2:30 PM EDT by telephone and verified that I am speaking with the correct person using two identifiers. ?  ?I discussed the limitations, risks, security and privacy concerns of performing an evaluation and management service by telephone and the availability of in person appointments. I also discussed with the patient that there may be a patient responsible charge related to this service. The patient expressed understanding and agreed to proceed. ? ? ?History of Present Illness: ?22 yoM male former smoker followed for OSA, complicated by A. fib/ablation/Eliquis, peripheral neuropathy ?His original CPAP was 9 and worked for years. Following reassessment in December he has needed several pressure changes and mask changes with some frustration. ?On 2/4 he had CPAP desensitization at [facility]. ?CPAP follow up, full face mask is comfortable, but feels the pressure is worse, sleeps well at night, feels he is losing his voice while using full face mask, DME [employer]  ?Mouth breathing now with full face mask. Admits this and pollen seasson may be making him hoarse.  Walks outside a lot.  ?  ?Observations/Objective: ?CPAP auto 8-15/ [employer] ?Download 100% compliance, AHI 7.2/ hr (mostly centrals) ? ? ?Assessment and Plan: ?OSA- Good compliance. Still working on comfort issues.  ? ?Follow Up Instructions: ?4 months ?  ?I discussed the assessment and treatment plan with the patient. The patient was provided an opportunity to ask questions and all were answered. The patient agreed with the plan  and demonstrated an understanding of the instructions. ?  ?The patient was advised to call back or seek an in-person evaluation if the symptoms worsen or if the condition fails to improve as anticipated. ? ?I provided 24 minutes of non-face-to-face time during this encounter. ? ? ?Baird Lyons, MD ? ? ?10/06/21- 17 yoM male former smoker followed for OSA, complicated by A. fib/ablation/Eliquis, peripheral neuropathy, Ankylosing Spondylitis,  ?His original CPAP was 9 and worked for years. Following reassessment in December he has needed several pressure changes and mask changes with some frustration. ?On 2/420 he had CPAP desensitization at [facility]. ?CPAP 9/ [employer] ?Download- ?Body weight today- ?Covid vax- ?Flu vax- ? ? ?CXR 05/03/19- ?IMPRESSION: ?No active cardiopulmonary disease. ? ?ROS-see HPI   + = positive ?Constitutional:    weight loss, night sweats, fevers, chills, fatigue, lassitude. ?HEENT:    headaches, difficulty swallowing, tooth/dental problems, sore throat,  ?     sneezing, itching, ear ache, nasal congestion, post nasal drip, snoring ?CV:    chest pain, orthopnea, PND, swelling in lower extremities, anasarca,                                  dizziness, palpitations ?Resp:   shortness of breath with exertion or at rest.   ?             productive cough,   non-productive cough, coughing up of blood.   ?           change in color of mucus.  wheezing.   ?Skin:    rash or lesions. ?GI:  No-   heartburn, indigestion, abdominal pain, nausea, vomiting, diarrhea,  ?  change in bowel habits, loss of appetite ?GU: dysuria, change in color of urine, no urgency or frequency.   flank pain. ?MS:   +joint pain, stiffness, decreased range of motion, back pain. ?Neuro-     nothing unusual ?Psych:  change in mood or affect.  depression or anxiety.   memory loss. ? ?OBJ- Physical Exam ?General- Alert, Oriented, Affect-appropriate, Distress- none acute, +tall/lean ?Skin- rash-none, lesions- none, excoriation-  none ?Lymphadenopathy- none ?Head- atraumatic ?           Eyes- Gross vision intact, PERRLA, conjunctivae and secretions clear ?           Ears- Hearing, canals-normal ?           Nose- Clear, no-Septal dev, mucus, polyps, erosion, perforation  ?           Throat- Mallampati II , mucosa clear , drainage- none, tonsils- atrophic ?Neck- flexible , trachea midline, no stridor , thyroid nl, carotid no bruit ?Chest - symmetrical excursion , unlabored ?          Heart/CV- RRR , no murmur , no gallop  , no rub, nl s1 s2 ?                          - JVD- none , edema- none, stasis changes- none, varices- none ?          Lung- clear to P&A, wheeze- none, cough- none , dullness-none, rub- none ?          Chest wall-  ?Abd-  ?Br/ Gen/ Rectal- Not done, not indicated ?Extrem- cyanosis- none, clubbing, none, atrophy- none, strength- nl ?Neuro- grossly intact to observation ? ? ? ? ? ? ? ? ? ? ? ? ? ? ? ? ? ? ? ? ? ? ? ? ? ? ? ? ? ? ? ? ? ? ? ? ? ? ? ? ? ? ? ? ? ? ? ? ? ? ? ? ? ? ? ? ? ? ? ? ? ? ? ? ? ? ? ? ? ? ? ? ? ? ? ? ? ? ? ? ? ? ? ? ? ? ? ? ? ? ? ? ? ? ? ? ? ? ? ? ? ? ? ? ? ? ? ? ? ? ? ? ? ? ? ? ? ? ? ? ? ? ? ? ? ? ? ? ? ? ? ? ? ? ? ? ? ? ? ? ? ? ? ? ? ? ? ? ? ? ? ? ? ? ? ? ? ? ? ? ?

## 2021-10-06 ENCOUNTER — Ambulatory Visit: Payer: Medicare Other | Admitting: Internal Medicine

## 2021-10-08 ENCOUNTER — Ambulatory Visit: Payer: Medicare Other | Admitting: Physical Therapy

## 2021-10-15 ENCOUNTER — Ambulatory Visit: Payer: Medicare Other | Admitting: Physical Therapy

## 2021-10-21 ENCOUNTER — Other Ambulatory Visit: Payer: Self-pay

## 2021-10-21 ENCOUNTER — Ambulatory Visit (INDEPENDENT_AMBULATORY_CARE_PROVIDER_SITE_OTHER): Payer: Medicare Other | Admitting: Cardiology

## 2021-10-21 ENCOUNTER — Encounter: Payer: Self-pay | Admitting: Cardiology

## 2021-10-21 VITALS — BP 90/62 | HR 74 | Ht 76.0 in | Wt 215.6 lb

## 2021-10-21 DIAGNOSIS — I341 Nonrheumatic mitral (valve) prolapse: Secondary | ICD-10-CM

## 2021-10-21 DIAGNOSIS — I480 Paroxysmal atrial fibrillation: Secondary | ICD-10-CM | POA: Diagnosis not present

## 2021-10-21 DIAGNOSIS — R002 Palpitations: Secondary | ICD-10-CM

## 2021-10-21 NOTE — Patient Instructions (Signed)

## 2021-10-21 NOTE — Progress Notes (Signed)
? ? ? ? ?TWS:[id number]-NT atrial fibrillation. Nuclear study March 2006 was normal. Had atrial fibrillation ablation 02/19/10 in [place]. Patient continues to be followed there. Abdominal ultrasound March 2017 showed no aneurysm. 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  At most recent office visit patient was found to have recurrent atrial tachycardia.  He underwent successful cardioversion May 2021.  Had repeat ablation medical [facility] 7/21.  Since last seen, the patient denies any dyspnea on exertion, orthopnea, PND, pedal edema, palpitations, syncope or chest pain. ? ? ?Current Outpatient Medications  ?Medication Sig Dispense Refill  ? acidophilus (RISAQUAD) CAPS capsule Take 1 capsule by mouth daily.    ? Alpha-Lipoic Acid 600 MG CAPS Take 600 mg by mouth daily.    ? apixaban (ELIQUIS) 5 MG TABS tablet TAKE 1 TABLET TWICE A DAY 180 tablet 1  ? Ascorbic Acid (VITAMIN C) 1000 MG tablet Take 1,000 mg by mouth daily.    ? b complex vitamins capsule Take 1 capsule by mouth daily.    ? Cyanocobalamin 5000 MCG SUBL Place 1 tablet under the tongue daily.     ? gabapentin (NEURONTIN) 300 MG capsule Take '300mg'$ , '300mg'$ , '300mg'$  and '600mg'$  for the last dose of the QID 450 capsule 3  ? Loperamide-Simethicone 2-125 MG TABS Take 1 tablet by mouth as needed.     ? metoprolol succinate (TOPROL XL) 25 MG 24 hr tablet Take 1 tablet (25 mg total) by mouth daily. 90 tablet 3  ? Multiple Vitamin (MULTIVITAMIN) tablet Take 1 tablet by mouth daily.    ? Multiple Vitamins-Minerals (PRESERVISION AREDS 2+MULTI VIT) CAPS Take 1 tablet by mouth daily.     ? NEXIUM 40 MG capsule TAKE 1 CAPSULE DAILY (Patient taking differently: Take 40 mg by mouth daily.) 90 capsule 3  ? NON FORMULARY Take 2 capsules by mouth daily. MITOQ    ? pyridOXINE (VITAMIN B-6) 50 MG tablet Take 50 mg by mouth 2 (two) times daily.    ? rosuvastatin  (CRESTOR) 5 MG tablet Take 5 mg by mouth daily. Taking 1/2 tab every other day    ? ?No current facility-administered medications for this visit.  ? ? ? ?Past Medical History:  ?Diagnosis Date  ? A-fib ([place])   ? cardioversion x2  ? Ankylosing spondylitis ([place])   ? Atrial flutter ([place])   ? GERD (gastroesophageal reflux disease)   ? Hemorrhoids   ? Irregular heart beat   ? MVP (mitral valve prolapse)   ? WITH A MIDSYSTOLIC CLICK  ? OSA (obstructive sleep apnea)   ? moderate OSA NPSG 10/24/09 - AHO 21.8/hr  ? Peripheral neuropathy   ? Vitamin B12 deficiency   ? ? ?Past Surgical History:  ?Procedure Laterality Date  ? ABLATION    ? CARDIOVERSION N/A 12/20/2019  ? Procedure: CARDIOVERSION;  Surgeon: Jerline Pain, MD;  Location: [facility] ENDOSCOPY;  Service: Cardiovascular;  Laterality: N/A;  ? COLONOSCOPY    ? INGUINAL HERNIA REPAIR    ? venous ligation    ? for varices left lower leg  ? ? ?Social History  ? ?Socioeconomic History  ? Marital status: Married  ?  Spouse name: Not on file  ? Number of children: 2  ? Years of education: 7  ? Highest education level: Not on file  ?Occupational History  ? Occupation: Press photographer  ?  Employer: [employer]  ?Tobacco Use  ? Smoking status:  Former  ?  Packs/day: 0.00  ?  Types: Cigarettes  ?  Quit date: 07/27/1969  ?  Years since quitting: 52.2  ? Smokeless tobacco: Never  ? Tobacco comments:  ?  less than 1 pack/day  ?Vaping Use  ? Vaping Use: Never used  ?Substance and Sexual Activity  ? Alcohol use: Yes  ?  Comment: 1-2 most days  ? Drug use: No  ? Sexual activity: Not on file  ?Other Topics Concern  ? Not on file  ?Social History Narrative  ? Not on file  ? ?Social Determinants of Health  ? ?Financial Resource Strain: Not on file  ?Food Insecurity: Not on file  ?Transportation Needs: Not on file  ?Physical Activity: Not on file  ?Stress: Not on file  ?Social Connections: Not on file  ?Intimate Partner Violence: Not on file  ? ? ?Family History  ?Problem Relation Age of Onset  ?  Prostate cancer Father   ? Arthritis Father   ? Cancer Father   ? Heart failure Mother   ? Hypertension Mother   ? ? ?ROS: no fevers or chills, productive cough, hemoptysis, dysphasia, odynophagia, melena, hematochezia, dysuria, hematuria, rash, seizure activity, orthopnea, PND, pedal edema, claudication. Remaining systems are negative. ? ?Physical Exam: ?Well-developed well-nourished in no acute distress.  ?Skin is warm and dry.  ?HEENT is normal.  ?Neck is supple.  ?Chest is clear to auscultation with normal expansion.  ?Cardiovascular exam is regular rate and rhythm.  ?Abdominal exam nontender or distended. No masses palpated. ?Extremities show no edema. ?neuro grossly intact ? ?ECG-normal sinus rhythm with first-degree AV block, left anterior fascicular block, no ST changes.  Personally reviewed ? ?A/P ? ?1 paroxysmal atrial fibrillation-patient remains in sinus rhythm and has had previous ablation of atrial tachycardia.  We will continue metoprolol and apixaban. ? ?2 palpitations-symptoms are reasonly well controlled.  Continue beta-blocker at present dose. ? ?3 history of mitral valve prolapse ? 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 ? ?5 hyperlipidemia-continue statin. ? ?Kirk Ruths, MD ? ? ? ?

## 2021-10-27 ENCOUNTER — Ambulatory Visit: Payer: Medicare Other | Attending: Sports Medicine | Admitting: Physical Therapy

## 2021-10-27 DIAGNOSIS — M6281 Muscle weakness (generalized): Secondary | ICD-10-CM | POA: Diagnosis not present

## 2021-10-27 DIAGNOSIS — R2689 Other abnormalities of gait and mobility: Secondary | ICD-10-CM | POA: Diagnosis not present

## 2021-10-27 DIAGNOSIS — R2681 Unsteadiness on feet: Secondary | ICD-10-CM | POA: Insufficient documentation

## 2021-10-27 NOTE — Therapy (Signed)
[facility] ?[address], [address] ?[place], [place], [address] ?Phone: [phone]   Fax:  [phone] ? ?Physical Therapy Treatment ? ?Patient Details  ?Name: Wesley [name]. ?MRN: [id number] ?Date of Birth: 1940/11/20 ?Referring Provider (PT): Dr. Stefanie Libel ? ? ?Encounter Date: 10/27/2021 ? ? PT End of Session - 10/27/21 1714   ? ? Visit Number 5   ? Number of Visits 8   ? Date for PT Re-Evaluation 11/01/21   ? Authorization Type Medicare/Tricare   ? PT Start Time 1535   ? PT Stop Time 1610   ? PT Time Calculation (min) 35 min   ? Activity Tolerance Patient tolerated treatment well   ? Behavior During Therapy [facility] for tasks assessed/performed   ? ?  ?  ? ?  ? ? ?Past Medical History:  ?Diagnosis Date  ? A-fib ([place])   ? cardioversion x2  ? Ankylosing spondylitis ([place])   ? Atrial flutter ([place])   ? GERD (gastroesophageal reflux disease)   ? Hemorrhoids   ? Irregular heart beat   ? MVP (mitral valve prolapse)   ? WITH A MIDSYSTOLIC CLICK  ? OSA (obstructive sleep apnea)   ? moderate OSA NPSG 10/24/09 - AHO 21.8/hr  ? Peripheral neuropathy   ? Vitamin B12 deficiency   ? ? ?Past Surgical History:  ?Procedure Laterality Date  ? ABLATION    ? CARDIOVERSION N/A 12/20/2019  ? Procedure: CARDIOVERSION;  Surgeon: Jerline Pain, MD;  Location[facility] ENDOS[facility];  Service: Cardiovascular;  Laterality: N/A;  ? COLONOSCOPY    ? INGUINAL HERNIA REPAIR    ? venous ligation    ? for varices left lower leg  ? ? ?There were no vitals filed for this visit. ? ? Subjective Assessment - 10/27/21 1535   ? ? Subjective Doing well with my exercises.  Using green band and back up to 30, with no problem.   ? Patient Stated Goals To make sure I'm doing the exercises correctly and check my balance.   ? Currently in Pain? No/denies   ? ?  ?  ? ?  ? ? ? ? ? [facility] PT Assessment - 10/27/21 0001   ? ?  ? Strength  ? Right Hip Extension 4/5   ? Right Hip ABduction 3+/5   ? Left Hip Extension 4/5   ? Left Hip ABduction  3+/5   ? ?  ?  ? ?  ? ? ? ? ? ?Access Code: [email or web address] ?URL: [email or web address] ?Date: 10/27/2021 ?Prepared by: [facility] ? ?Program Notes ?Provided glut med/standing on step with opposite foot in air-tapping to floor, back into air, 10 reps x 2.  Repeat other leg.  Pt return demo understanding. ? ?Exercises-Verbalized/returned demo understanding ?- Seated Hamstring Stretch  - 2 x daily - 7 x weekly - 1 sets - 3 reps - 30 seconds hold ?- Sit to stand in stride stance  - 1 x daily - 5 x weekly - 1 sets - 5 reps ?- Standing Gastroc Stretch at [employer]  - 2 x daily - 7 x weekly - 1 sets - 3 reps - 30 seconds hold ?- Standing Hip Abduction with Counter Support  - 1 x daily - 5 x weekly - 2 sets - 10 reps ?- Standing Hip Extension with Counter Support  - 1 x daily - 5 x weekly - 1-2 sets - 10 reps ?- Heel Raises with  Counter Support  - 2 x daily - 7 x weekly - 1 sets - 10-15 reps ?- Mini Squat with Counter Support  - 1 x daily - 5 x weekly - 2 sets - 10 reps ? ?(Did not perform the following, as pt is not having increased back pain anymore): ? Seated Active Hip Flexion  - 1-2 x daily - 5 x weekly - 1 sets - 10 reps ?- Seated Shoulder Rolls  - 1-2 x daily - 7 x weekly - 1 sets - 10 reps ?- Lower Trunk Rotations  - 1 x daily - 7 x weekly - 3 sets - 10 reps ?- Supine Scapular Retraction  - 1 x daily - 7 x weekly - 1 sets - 10 reps ? ? ?Pt demonstrates his low back flexibility/strengthening/core exercise program that trainer has been working on with him; he performs those and above PT HEP exercises daily. ? ?Educated patient in importance of continued glut activation for hip strengthening and stability. ? ? ? ? ? ? ? ? ? ? ? ? ? ? ? ? ? ? ? PT Education - 10/27/21 1714   ? ? Education Details Progress towards goals, POC, and plans for d/c   ? Person(s) Educated Patient   ? Methods Explanation   ? Comprehension Verbalized understanding   ? ?  ?  ? ?  ? ? ? PT Short Term  Goals - 10/03/21 0943   ? ?  ? PT SHORT TERM GOAL #1  ? Title Pt will be independent with HEP for improved strength, balance, and gait.  TARGET3/04/2022   ? Time 4   ? Period Weeks   ? Status Achieved   ? ?  ?  ? ?  ? ? ? ? PT Long Term Goals - 10/27/21 1549   ? ?  ? PT LONG TERM GOAL #1  ? Title Pt will be independent with progression of HEP for improved strength, balance, and gait.  TARGET 10/31/2021   ? Time 8   ? Period Weeks   ? Status Achieved   ?  ? PT LONG TERM GOAL #2  ? Title Pt will improve single limb stance to at least 3 sec each leg for improved hip stability.   ? Baseline 1 sec or less multiple trials; RLE 2.38, 7.97 sec; LLE 3.2, RLE 4.47   ? Time 8   ? Period Weeks   ? Status Achieved   ?  ? PT LONG TERM GOAL #3  ? Title Pt will improve hip abductor and extensor strength to at least 3+/5 on RLE and 4/5 on LLE to improve single limb stance and gait mechanics.   ? Time 8   ? Period Weeks   ? Status Achieved   ? ?  ?  ? ?  ? ? ? ? ? ? ? ? Plan - 10/27/21 1714   ? ? Clinical Impression Statement Assessed LTGs this visit, with pt meeting 3 of 3 LTGs.  He reports improved stability and feeling stronger through hips and core.  He demonstrates improved hip strength and improved hip stability for increased single limb stance time.  He verbalizes and demonstrates full HEP and consistently performs daily.  He is appropriate for d/c at this time.   ? Comorbidities PMH:  PAF, atrial tachycardia s/p cardioversion 11/2019 and ablation 7/21, ankylosing spondylitis, GERD, MVP, OSA   ? Examination-Activity Limitations Locomotion Level;Transfers;Stand   ? Examination-Participation Restrictions Community Activity;Other   community, exercise,  travel, walking his dog  ? Stability/Clinical Decision Making Evolving/Moderate complexity   ? Rehab Potential Good   ? PT Frequency 1x / week   ? PT Duration 8 weeks   including eval  ? PT Treatment/Interventions ADLs/Self Care Home Management;Gait training;Functional mobility  training;Therapeutic activities;Therapeutic exercise;Balance training;Neuromuscular re-education;Patient/family education   ? PT Next Visit Plan D/C this visit.   ? Consulted and Agree with Plan of Care Patient   ? ?  ?  ? ?  ? ? ?Patient will benefit from skilled therapeutic intervention in order to improve the following deficits and impairments:  Abnormal gait, Decreased balance, Decreased mobility, Decreased strength ? ?Visit Diagnosis: ?Muscle weakness (generalized) ? ?Unsteadiness on feet ? ?Other abnormalities of gait and mobility ? ? ? ? ?Problem List ?Patient Active Problem List  ? Diagnosis Date Noted  ? Abnormality of gait 06/26/2021  ? Chronic hoarseness 10/04/2020  ? PAF (paroxysmal atrial fibrillation) ([place])   ? DDD (degenerative disc disease), lumbar 10/28/2017  ? Bilateral bunions 08/20/2016  ? Varicose veins of right lower extremity with complications 88/50/2774  ? Bruit 10/10/2015  ? Varicose veins of leg with complications 12/87/8676  ? Ankylosing spondylitis ([place]) 01/14/2015  ? Chronic anticoagulation 01/14/2015  ? Sleep apnea 01/14/2015  ? S/P ablation of atrial fibrillation 01/08/2015  ? Persistent atrial fibrillation ([place]) 01/08/2015  ? Orthostatic dizziness 12/01/2013  ? Calf pain 11/22/2012  ? 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/22/2012  ? Peripheral neuropathy 10/21/2012  ? Pain in joint, ankle and foot 09/21/2012  ? Disturbance of skin sensation 09/21/2012  ? 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 ([place]) 09/21/2012  ? Other general symptoms(780.99) 09/21/2012  ? Heel pain 06/07/2012  ? Plantar fasciitis 06/07/2012  ? Mitral valve prolapse 06/03/2011  ? Arthritis pain of shoulder 04/03/2011  ? Rotator cuff syndrome of right shoulder 04/03/2011  ? Paroxysmal atrial fibrillation ([place]) 01/20/2011  ? Hypercholesterolemia 01/20/2011  ? OBSTRUCTIVE SLEEP APNEA 10/08/2009  ? ? ?Felicia Both W., PT ?10/27/2021, 5:17 PM ? ?[facility] ?[address], [address] ?[place], [place], [address] ?Phone: [phone]   Fax:  [phone] ? ?Name: Wesley [name]. ?MRN: [id number] ?Date of Birth: Apr 27, 1941 ? ? ? ?

## 2021-11-08 NOTE — Progress Notes (Signed)
?HPI ? male former smoker followed for OSA, complicated by A. fib/ablation/Eliquis, peripheral neuropathy ?NPSG 10/24/09-AHI 21.8/hour, desat to 86%, body weight 230 pounds ?HST-07/06/2018-AHI 17.5/hour, desaturation to 82%, body weight 216 pound ? ?----------------------------------------------------------------------------------------------- ? ? ?04/07/2019-  81 yo male former smoker followed for OSA, complicated by A. fib/ablation/Eliquis, peripheral neuropathy, macular degeneration, Ankylosing Spondylitis (remote dx, inactive) ?CPAP auto 8-15/ [employer]   Mostly around 12.7 cwp ?Download compliance 100%, AHI 8.2/ hr     4 minutes of Cheyne-Stokes reported ?-----OSA on CPAP auto 8-15, DME: [employer], pt states his machine is working fine, reports some "adjustments" since switching to a full face mask ?Body weight today- 216 lbs ?Had flu vax- PCP ?Sleeps well now with CPAP full face mask and chin strap. He is finally comfortable, we agreed not to tweak settings. Accept minimal break-through apnea pattern. ?Afib mostly controlled- followed by cardiology.  ? ?11/10/21- 81 yo male former smoker followed for OSA, complicated by A. fib/ablation/Eliquis, peripheral neuropathy, macular degeneration, Ankylosing Spondylitis (remote dx, inactive) ?CPAP auto 8-15/ [employer]    ?Download compliance 100%, AHI 6.2/ hr ?Body weight today- ?Covid vax-5 Phizer ?Flu vax-had ?-----Patient was told he has "muscle fatigue" when he went to the ENT and was told it was mostly age related. Patient is doing good otherwise. ?Download reviewed. Comfortable and satisfied for now. We would like fewer breakthrough apneas- will watch this but it doesn't look like a pressure setting issue. Discussed CPAP v Inspire option and can refer if he decides he wants. He has seen Dr Wilburn Cornelia before.  ?CXR 05/03/19- ?IMPRESSION: ?No active cardiopulmonary disease. ? ?ROS-see HPI   + = positive ?Constitutional:    weight loss, night sweats, fevers, chills, fatigue,  lassitude. ?HEENT:    headaches, difficulty swallowing, tooth/dental problems, sore throat,  ?     sneezing, itching, ear ache, nasal congestion, post nasal drip, snoring ?CV:    chest pain, orthopnea, PND, swelling in lower extremities, anasarca,                                  ? dizziness, palpitations ?Resp:   shortness of breath with exertion or at rest.   ?             productive cough,   non-productive cough, coughing up of blood.   ?           change in color of mucus.  wheezing.   ?Skin:    rash or lesions. ?GI:  No-   heartburn, indigestion, abdominal pain, nausea, vomiting, diarrhea,  ?               change in bowel habits, loss of appetite ?GU: dysuria, change in color of urine, no urgency or frequency.   flank pain. ?MS:   +joint pain, stiffness, decreased range of motion, back pain. ?Neuro-     nothing unusual ?Psych:  change in mood or affect.  depression or anxiety.   memory loss. ? ?OBJ- Physical Exam ?General- Alert, Oriented, Affect-appropriate, Distress- none acute, +tall/lean ?Skin- rash-none, lesions- none, excoriation- none ?Lymphadenopathy- none ?Head- atraumatic ?           Eyes- Gross vision intact, PERRLA, conjunctivae and secretions clear ?           Ears- Hearing, canals-normal ?           Nose- Clear, no-Septal dev, mucus, polyps, erosion, perforation  ?  Throat- Mallampati II , mucosa clear , drainage- none, tonsils- atrophic ?Neck- flexible , trachea midline, no stridor , thyroid nl, carotid no bruit ?Chest - symmetrical excursion , unlabored ?          Heart/CV- RRR , no murmur , no gallop  , no rub, nl s1 s2 ?                          - JVD- none , edema- none, stasis changes- none, varices- none ?          Lung- clear to P&A, wheeze- none, cough- none , dullness-none, rub- none ?          Chest wall-  ?Abd-  ?Br/ Gen/ Rectal- Not done, not indicated ?Extrem- cyanosis- none, clubbing, none, atrophy- none, strength- nl ?Neuro- grossly intact to  observation ? ? ? ? ? ? ? ? ? ? ? ? ? ? ? ? ? ? ? ? ? ? ? ? ? ? ? ? ? ? ? ? ? ? ? ? ? ? ? ? ? ? ? ? ? ? ? ? ? ? ? ? ? ? ? ? ? ? ? ? ? ? ? ? ? ? ? ? ? ? ? ? ? ? ? ? ? ? ? ? ? ? ? ? ? ? ? ? ? ? ? ? ? ? ? ? ? ? ? ? ? ? ? ? ? ? ? ? ? ? ? ? ? ? ? ? ? ? ? ? ? ? ? ? ? ? ? ? ? ? ? ? ? ? ? ? ? ? ? ? ? ? ? ? ? ? ? ? ? ? ? ? ? ? ? ? ? ? ? ? ? ? ? ? ? ? ? ? ? ? ? ? ? ? ? ? ? ? ? ? ? ? ? ? ? ? ? ? ? ? ? ? ? ? ? ? ? ? ? ? ? ? ? ? ? ? ? ? ? ? ? ? ? ? ? ? ? ? ? ? ? ? ? ? ? ? ? ? ? ? ? ? ? ? ? ? ? ? ? ? ? ? ? ? ? ? ? ? ? ? ? ? ? ? ? ? ? ? ? ? ? ? ? ? ? ? ? ? ? ? ? ? ? ? ? ? ? ? ? ? ? ? ? ? ? ? ? ? ? ? ? ? ? ? ? ? ?

## 2021-11-10 ENCOUNTER — Ambulatory Visit (INDEPENDENT_AMBULATORY_CARE_PROVIDER_SITE_OTHER): Payer: Medicare Other | Admitting: Internal Medicine

## 2021-11-10 ENCOUNTER — Encounter: Payer: Self-pay | Admitting: Internal Medicine

## 2021-11-10 DIAGNOSIS — I480 Paroxysmal atrial fibrillation: Secondary | ICD-10-CM | POA: Diagnosis not present

## 2021-11-10 DIAGNOSIS — G4733 Obstructive sleep apnea (adult) (pediatric): Secondary | ICD-10-CM

## 2021-11-10 NOTE — Patient Instructions (Signed)
We can continue CPAP auto 8-15  Please call if we can help 

## 2021-11-10 NOTE — Assessment & Plan Note (Signed)
Continues to work with cardiology. Sinus rhythm today by my exam.  ?

## 2021-11-10 NOTE — Assessment & Plan Note (Signed)
Benefits from CPAP with good compliance. ?Plan- continue auto 8-15. Watch breakthrough events. ?

## 2021-11-11 ENCOUNTER — Ambulatory Visit: Payer: Medicare Other | Admitting: Physical Therapy

## 2021-11-11 ENCOUNTER — Ambulatory Visit: Payer: Medicare Other | Admitting: General Practice

## 2021-11-11 DIAGNOSIS — Z20822 Contact with and (suspected) exposure to covid-19: Secondary | ICD-10-CM | POA: Diagnosis not present

## 2021-11-22 DIAGNOSIS — Z20822 Contact with and (suspected) exposure to covid-19: Secondary | ICD-10-CM | POA: Diagnosis not present

## 2021-11-28 DIAGNOSIS — Z20822 Contact with and (suspected) exposure to covid-19: Secondary | ICD-10-CM | POA: Diagnosis not present

## 2021-12-09 DIAGNOSIS — H353122 Nonexudative age-related macular degeneration, left eye, intermediate dry stage: Secondary | ICD-10-CM | POA: Diagnosis not present

## 2021-12-09 DIAGNOSIS — H353212 Exudative age-related macular degeneration, right eye, with inactive choroidal neovascularization: Secondary | ICD-10-CM | POA: Diagnosis not present

## 2021-12-09 DIAGNOSIS — H35432 Paving stone degeneration of retina, left eye: Secondary | ICD-10-CM | POA: Diagnosis not present

## 2021-12-09 DIAGNOSIS — H43813 Vitreous degeneration, bilateral: Secondary | ICD-10-CM | POA: Diagnosis not present

## 2021-12-25 ENCOUNTER — Other Ambulatory Visit: Payer: Self-pay | Admitting: Cardiology

## 2021-12-25 DIAGNOSIS — I4891 Unspecified atrial fibrillation: Secondary | ICD-10-CM

## 2021-12-25 NOTE — Telephone Encounter (Signed)
Prescription refill request for Eliquis received. Indication:Afib Last office visit:3/23 Scr:0.8 Age: 81 Weight:98.5 kg  Prescription refilled

## 2022-01-13 ENCOUNTER — Ambulatory Visit (INDEPENDENT_AMBULATORY_CARE_PROVIDER_SITE_OTHER): Payer: Medicare Other | Admitting: Sports Medicine

## 2022-01-13 VITALS — BP 120/60 | Ht 75.0 in | Wt 210.0 lb

## 2022-01-13 DIAGNOSIS — M25551 Pain in right hip: Secondary | ICD-10-CM | POA: Diagnosis not present

## 2022-01-13 DIAGNOSIS — M459 Ankylosing spondylitis of unspecified sites in spine: Secondary | ICD-10-CM

## 2022-01-13 DIAGNOSIS — G603 Idiopathic progressive neuropathy: Secondary | ICD-10-CM | POA: Diagnosis not present

## 2022-01-13 DIAGNOSIS — M47818 Spondylosis without myelopathy or radiculopathy, sacral and sacrococcygeal region: Secondary | ICD-10-CM

## 2022-01-13 NOTE — Progress Notes (Signed)
PCP: Shon Baton, MD  Subjective:   HPI: Wesley [name] is a 81 y.o. male here for right posterior hip and buttock pain x 3 weeks.  He has a longstanding history of ankylosing spondylitis as well as known lumbar stenosis.  He has had a long issue with balance issues as well.  He has seen Dr. Oneida Alar for this in the past.  He is working with physical therapist Burnis Medin on these.  He also is doing some neuro physical therapy for balance issues.  The back and the balance issues have been doing better, however over the last 3 weeks or so he is having a nagging pain on the right posterior hip and buttock.  He denies any numbness tingling or shooting pains down the leg.  He denies the leg giving out.  He had she has no pain at night or when walking, but will get pain when he is sitting.  He denies any redness, swelling, no fever or chills.  No Known Allergies  BP 120/60 (BP Location: Right Arm, Patient Position: Sitting, Cuff Size: Normal)   Ht '6\' 3"'$  (1.905 m)   Wt 210 lb (95.3 kg)   BMI 26.25 kg/m      05/08/2021    1:49 PM 08/28/2021    1:30 PM  [facility] Adult Exercise  Frequency of aerobic exercise (# of days/week) 3 3  Average time in minutes 25 60  Frequency of strengthening activities (# of days/week) 3 3        No data to display        Lumbar spine 10/2017:        Objective:  Physical Exam:  Gen: Well-appearing, in no acute distress; non-toxic CV: Regular Rate. Well-perfused. Warm.  Resp: Breathing unlabored on room air; no wheezing. Psych: Fluid speech in conversation; appropriate affect; normal thought process Neuro: Sensation intact throughout. No gross coordination deficits.  MSK:  - Bilateral hips: There is some tenderness to palpation over the right superior lateral buttock near the insertion of the gluteus medius tendon.  There is no redness or ecchymosis in this region.  There are some pain around the SI joint, although no specific tenderness to palpation  distal to the PSIS.  Negative passive internal/external logroll.  Full range of motion about the hip.  Negative SI joint compression test.  There is weakness with lateral abduction bilaterally, otherwise 5/5 strength throughout the hip.  - Lumbar: No midline spinous process TTP.  Negative straight leg raise bilaterally.  Strength 5/5 in bilateral lower extremities.   Assessment & Plan:  1. Right posterolateral hip and gluteus pain 2. SI joint sclerosis bilaterally 3.  Ankylosing spondylitis  We had a lengthy discussion with Wesley [name] today regarding the possible etiology of his right posterolateral hip/buttock pain.  His pain seems more musculature in nature near the gluteus medius tendon.  He does have quite significant SI joint sclerosis bilaterally, although provocatively this does not seem to bother him as much.  He does have weakness with hip abduction, so we will get him started on home rehab for the hip and lateral abduction strengthening.  Given that he is doing a copious amount of home exercises, we will send a referral to [place] [facility] to have him work on Paediatric nurse and for an evaluation to help make his home rehab more succinct.  He will continue on his usual gabapentin for his neuropathy.  We will follow-up in 4-6 weeks.  May want to consider Pilates as an  option to work on multiple joint and balance issues.  Elba Barman, DO PGY-4, Sports Medicine Fellow [place]  This note was dictated using Dragon naturally speaking software and may contain errors in syntax, spelling, or content which have not been identified prior to signing this note.   I observed and examined the patient with the [facility] resident and agree with assessment and plan.  Note reviewed and modified by me. Ila Mcgill, MD

## 2022-02-04 ENCOUNTER — Ambulatory Visit: Payer: Medicare Other | Attending: Sports Medicine | Admitting: Physical Therapy

## 2022-02-04 DIAGNOSIS — R2681 Unsteadiness on feet: Secondary | ICD-10-CM | POA: Insufficient documentation

## 2022-02-04 DIAGNOSIS — G603 Idiopathic progressive neuropathy: Secondary | ICD-10-CM | POA: Insufficient documentation

## 2022-02-04 DIAGNOSIS — M6281 Muscle weakness (generalized): Secondary | ICD-10-CM | POA: Insufficient documentation

## 2022-02-04 DIAGNOSIS — M25551 Pain in right hip: Secondary | ICD-10-CM | POA: Insufficient documentation

## 2022-02-04 DIAGNOSIS — R2689 Other abnormalities of gait and mobility: Secondary | ICD-10-CM | POA: Diagnosis not present

## 2022-02-04 NOTE — Therapy (Signed)
OUTPATIENT PHYSICAL THERAPY LOWER EXTREMITY EVALUATION   Patient Name: Wesley [name]. MRN: [id number] DOB:[birth year], 81 y.o., male Today's Date: 02/04/2022   PT End of Session - 02/04/22 1009     Visit Number 1    Number of Visits 8    Date for PT Re-Evaluation 04/01/22    Authorization Type Medicare/Tricare    PT Start Time 1010    PT Stop Time 1104    PT Time Calculation (min) 54 min    Activity Tolerance Patient tolerated treatment well    Behavior During Therapy WFL for tasks assessed/performed             Past Medical History:  Diagnosis Date   A-fib ([place])    cardioversion x2   Ankylosing spondylitis ([facility])    Atrial flutter ([facility])    GERD (gastroesophageal reflux disease)    Hemorrhoids    Irregular heart beat    MVP (mitral valve prolapse)    WITH A MIDSYSTOLIC CLICK   OSA (obstructive sleep apnea)    moderate OSA NPSG 10/24/09 - AHO 21.8/hr   Peripheral neuropathy    Vitamin B12 deficiency    Past Surgical History:  Procedure Laterality Date   ABLATION     CARDIOVERSION N/A 12/20/2019   Procedure: CARDIOVERSION;  Surgeon: Jerline Pain, MD;  Location: [facility];  Service: Cardiovascular;  Laterality: N/A;   COLONOSCOPY     INGUINAL HERNIA REPAIR     venous ligation     for varices left lower leg   Patient Active Problem List   Diagnosis Date Noted   Abnormality of gait 06/26/2021   Chronic hoarseness 10/04/2020   PAF (paroxysmal atrial fibrillation) ([place])    DDD (degenerative disc disease), lumbar 10/28/2017   Bilateral bunions 08/20/2016   Varicose veins of right lower extremity with complications 44/81/8563   Bruit 10/10/2015   Varicose veins of leg with complications 14/97/0263   Ankylosing spondylitis ([place]) 01/14/2015   Chronic anticoagulation 01/14/2015   Sleep apnea 01/14/2015   S/P ablation of atrial fibrillation 01/08/2015   Persistent atrial fibrillation ([place]) 01/08/2015   Orthostatic dizziness 12/01/2013   Calf pain 11/22/2012    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/22/2012   Peripheral neuropathy 10/21/2012   Pain in joint, ankle and foot 09/21/2012   Disturbance of skin sensation 09/21/2012   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 ([place]) 09/21/2012   Other general symptoms(780.99) 09/21/2012   Heel pain 06/07/2012   Plantar fasciitis 06/07/2012   Mitral valve prolapse 06/03/2011   Arthritis pain of shoulder 04/03/2011   Rotator cuff syndrome of right shoulder 04/03/2011   Paroxysmal atrial fibrillation ([place]) 01/20/2011   Hypercholesterolemia 01/20/2011   OBSTRUCTIVE SLEEP APNEA 10/08/2009    PCP: Shon Baton  REFERRING PROVIDER: Dr. 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   Dr Oneida Alar thinks that he may benefit from Pilates for a holistic multi-system approach. His peripheral neuropathy contributes to a balance issue for the past 3 or so years. He noticed some weakness in Rt hip, pain with certain activites.  He is unsure though because he has so many exercises  from previous PT. Currently does about 45-60 min daily, between balance, strength and cardio.    PERTINENT HISTORY: Dr. Oneida Alar note: He has a longstanding history of ankylosing spondylitis as well as known lumbar stenosis.  He has had a long issue with balance issues as well.  He has seen Dr. Oneida Alar for this in the past.  He is working with physical therapist Burnis Medin on these.  He also is doing some neuro physical therapy for balance issues.  The back and the balance issues have been doing better, however over the last 3  weeks or so he is having a nagging pain on the right posterior hip and buttock.  He denies any numbness tingling or shooting pains down the leg.  He denies the leg giving out.  He has no pain at night or when walking, but will get pain when he is sitting.  He denies any redness, swelling, no fever or chills.   PAIN:  Are you having pain? Yes: NPRS scale: 4/10 Pain location: Rt hip  Pain description: achy sore  Aggravating factors: elliptical, sitting awhile > 20-30 min, ?walking   Relieving factors: changing positions, exercise? 7/10 yesterday, gets to 2/10 2-3 mos.   PRECAUTIONS: Other: balance A-FIB AS   WEIGHT BEARING RESTRICTIONS No  FALLS:  Has patient fallen in last 6 months? No but has lost and regained balance several times.  He has to slow his pace.  He catches himself dragging his feet.    LIVING ENVIRONMENT: Lives with: lives with their spouse Lives in: House/apartment Stairs:  NT Has following equipment at home:  unknown   OCCUPATION: retired, Systems developer   PLOF: Independent  PATIENT GOALS To figure out the balance of working my hip, back and not to overdo it   OBJECTIVE:   DIAGNOSTIC FINDINGS: none recent   PATIENT SURVEYS:  FOTO emailed to patient on eval due to time constraints   COGNITION:  Overall cognitive status: Within functional limits for tasks assessed     SENSATION: Proprioception: Impaired in feet   EDEMA:  None   MUSCLE LENGTH: Hamstrings: Passive SLR to 40 deg bilateral  Thomas test: Right NT deg; Left NT deg  POSTURE: rounded shoulders, forward head, decreased lumbar lordosis, increased thoracic kyphosis, and posterior pelvic tilt  PALPATION: Rt glute medius sore, painful to palpation and just beside L5  LOWER EXTREMITY ROM:  Active ROM Right eval Left eval  Hip flexion    Hip extension    Hip abduction    Hip adduction    Hip internal rotation    Hip external rotation    Knee flexion    Knee extension    Ankle  dorsiflexion    Ankle plantarflexion    Ankle inversion    Ankle eversion     (Blank rows = not tested)  LOWER EXTREMITY MMT:  MMT Right eval Left eval  Hip flexion 5 5  Hip extension    Hip abduction 3 3  Hip adduction    Hip internal rotation    Hip external rotation    Knee flexion 5 5  Knee extension 5 5  Ankle dorsiflexion 5 5  Ankle plantarflexion    Ankle inversion    Ankle eversion     (Blank rows = not tested)  LOWER EXTREMITY SPECIAL TESTS:  Hip special tests: Trendelenburg test: positive pos bilateral   FUNCTIONAL TESTS:  5 times sit to stand: 7.8 sec  2 minute walk test: NT on eval  SLS   GAIT: Distance walked: 150 Assistive device utilized:  none  Level of assistance: Modified independence Comments: irregular pattern of gait when distracted, turning head.  No notable foot drop but poor push off.     TODAY'S TREATMENT: PT eval, discussed organizing HEP, rationale for exercises (balance vs strength vs flexibility) Pilates, core, breathing (intro, basics) and vs Yoga   PATIENT EDUCATION:  Education details: see above treatment Person educated: Patient Education method: Explanation, Demonstration, and Verbal cues Education comprehension: verbalized understanding and needs further education   HOME EXERCISE PROGRAM: Has an extensive HEP.  Offered tips for streamlining and form with hip abd in sidelying.  Changed reps and pacing.   ASSESSMENT:  CLINICAL IMPRESSION:  Patient is a 81 y.o. male  who was seen today for physical therapy evaluation and treatment for hip pain in the presence of chronic back pain due to Ankylosing Spondylitis and OA and balance issues. He presents with joint stiffness in ankles, hips and spine.  He has some significant weakness in hips and core. His pain seems to be stemming from soft tissue of Rt hip, tendinopathy in nature.  I do think that Pilates will address all of his deficits in a efficient way and he is a generally  active person. Will se him for about 8 visits and transition to a community setting if he is interested and open to the idea.     OBJECTIVE IMPAIRMENTS Abnormal gait, decreased balance, decreased coordination, decreased mobility, difficulty walking, decreased ROM, decreased strength, hypomobility, increased fascial restrictions, impaired flexibility, impaired sensation, improper body mechanics, postural dysfunction, and pain.   ACTIVITY LIMITATIONS carrying, lifting, bending, sitting, squatting, sleeping, and locomotion level  PARTICIPATION LIMITATIONS: interpersonal relationship, driving, and community activity  PERSONAL FACTORS Past/current experiences and 1-2 comorbidities: AS, back pain  are also affecting patient's functional outcome.   REHAB POTENTIAL: Excellent  CLINICAL DECISION MAKING: Stable/uncomplicated  EVALUATION COMPLEXITY: Low   GOALS: Goals reviewed with patient? Yes  SHORT TERM GOALS: Target date: 03/04/2022  Patient will notice less pain in Rt hip during light cardio/elliptical Baseline: does 30 min with hip pain, moderate  Goal status: INITIAL  2.  Pt will be able to show I with HEP which takes no more than 30 min per day Baseline: 45-60 min Goal status: INITIAL  3.  Pt will be able to complete balance screen and goal set for long term fall risk mgmt Baseline:  Goal status: INITIAL   LONG TERM GOALS: Target date: 04/01/2022   Patient will be able to sit for meals and recreation , > 30 min with min pain when standing  Baseline:  Goal status: INITIAL  2.  Pt will be able to demo strength in hip abduction to 4/5 and show no hip drop in SLS  Baseline: 3/5 Goal status: INITIAL  3.  Pt will be able to get up and down from the floor independently with ease  Baseline:  Goal status: INITIAL  4.  Pt will be able to improve balance based on baseline screening  Baseline: TBA Goal status: INITIAL  5.  Pt will be I with HEP for core, hips upon discharge   Baseline:  Goal status: INITIAL  6.  FOTO TBA, emailed the patient on eval due to time  Baseline:  Goal status: INITIAL   PLAN: PT FREQUENCY: 1x/week  PT DURATION: 8 weeks  PLANNED INTERVENTIONS: Therapeutic exercises, Therapeutic activity, Neuromuscular re-education, Balance training, Gait training, Patient/Family education, Self Care, Joint mobilization, Aquatic Therapy,  Dry Needling, Cryotherapy, Manual therapy, and Re-evaluation PILATES   PLAN FOR NEXT SESSION: intro to Reformer/Tower  Basics, core activation and breathing.    Wyllow Seigler, PT 02/04/2022, 12:54 PM   Raeford Razor, PT 02/04/22 1:13 PM Phone: [phone] Fax: [phone]

## 2022-02-10 ENCOUNTER — Encounter: Payer: Self-pay | Admitting: Physical Therapy

## 2022-02-10 ENCOUNTER — Ambulatory Visit: Payer: Medicare Other | Admitting: Physical Therapy

## 2022-02-10 DIAGNOSIS — R2681 Unsteadiness on feet: Secondary | ICD-10-CM

## 2022-02-10 DIAGNOSIS — M6281 Muscle weakness (generalized): Secondary | ICD-10-CM

## 2022-02-10 DIAGNOSIS — M25551 Pain in right hip: Secondary | ICD-10-CM | POA: Diagnosis not present

## 2022-02-10 DIAGNOSIS — G603 Idiopathic progressive neuropathy: Secondary | ICD-10-CM | POA: Diagnosis not present

## 2022-02-10 DIAGNOSIS — R2689 Other abnormalities of gait and mobility: Secondary | ICD-10-CM | POA: Diagnosis not present

## 2022-02-10 NOTE — Therapy (Signed)
OUTPATIENT PHYSICAL THERAPY TREATMENT NOTE   Patient Name: Wesley [name]. MRN: [id number] DOB:1941/07/03, 81 y.o., male Today's Date: 02/10/2022  PCP: Shon Baton, MD  REFERRING PROVIDER: Dr.  Oneida Alar  END OF SESSION:   PT End of Session - 02/10/22 1110     Visit Number 2    Number of Visits 8    Date for PT Re-Evaluation 04/01/22    Authorization Type Medicare/Tricare    PT Start Time 1106    PT Stop Time 1145    PT Time Calculation (min) 39 min    Activity Tolerance Patient tolerated treatment well    Behavior During Therapy WFL for tasks assessed/performed             Past Medical History:  Diagnosis Date   A-fib ([place])    cardioversion x2   Ankylosing spondylitis ([place])    Atrial flutter ([facility])    GERD (gastroesophageal reflux disease)    Hemorrhoids    Irregular heart beat    MVP (mitral valve prolapse)    WITH A MIDSYSTOLIC CLICK   OSA (obstructive sleep apnea)    moderate OSA NPSG 10/24/09 - AHO 21.8/hr   Peripheral neuropathy    Vitamin B12 deficiency    Past Surgical History:  Procedure Laterality Date   ABLATION     CARDIOVERSION N/A 12/20/2019   Procedure: CARDIOVERSION;  Surgeon: Jerline Pain, MD;  Location: [facility];  Service: Cardiovascular;  Laterality: N/A;   COLONOSCOPY     INGUINAL HERNIA REPAIR     venous ligation     for varices left lower leg   Patient Active Problem List   Diagnosis Date Noted   Abnormality of gait 06/26/2021   Chronic hoarseness 10/04/2020   PAF (paroxysmal atrial fibrillation) ([place])    DDD (degenerative disc disease), lumbar 10/28/2017   Bilateral bunions 08/20/2016   Varicose veins of right lower extremity with complications 40/98/1191   Bruit 10/10/2015   Varicose veins of leg with complications 47/82/9562   Ankylosing spondylitis ([place]) 01/14/2015   Chronic anticoagulation 01/14/2015   Sleep apnea 01/14/2015   S/P ablation of atrial fibrillation 01/08/2015   Persistent atrial fibrillation ([place])  01/08/2015   Orthostatic dizziness 12/01/2013   Calf pain 11/22/2012   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/22/2012   Peripheral neuropathy 10/21/2012   Pain in joint, ankle and foot 09/21/2012   Disturbance of skin sensation 09/21/2012   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 ([place]) 09/21/2012   Other general symptoms(780.99) 09/21/2012   Heel pain 06/07/2012   Plantar fasciitis 06/07/2012   Mitral valve prolapse 06/03/2011   Arthritis pain of shoulder 04/03/2011   Rotator cuff syndrome of right shoulder 04/03/2011   Paroxysmal atrial fibrillation ([place]) 01/20/2011   Hypercholesterolemia 01/20/2011   OBSTRUCTIVE SLEEP APNEA 10/08/2009    REFERRING DIAG: Diagnosis M25.551 (ICD-10-CM) - Right hip pain 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  He has had a long issue with balance issues as well.  He has seen Dr. Oneida Alar for this in the past.  He is working with physical therapist Burnis Medin on these.  He also is doing some neuro physical therapy for balance issues.  The back and the balance issues have been doing better, however  over the last 3 weeks or so he is having a nagging pain on the right posterior hip and buttock.  He denies any numbness tingling or shooting pains down the leg.  He denies the leg giving out.  He had she has no pain at night or when walking, but will get pain when he is sitting.  He denies any redness, swelling, no fever or chills.   PRECAUTIONS: none  SUBJECTIVE: Those exercises on my side are helping my hip but I feel it in my back.    PAIN:  Are you having pain? Yes: NPRS scale: 1/10 Pain location: low back  Pain description: tight   Aggravating factors: bending  Relieving factors: exercises    OBJECTIVE: (objective measures completed at initial evaluation unless otherwise dated)  OBJECTIVE:    DIAGNOSTIC FINDINGS: none recent    PATIENT SURVEYS:  FOTO emailed to patient on eval due to time constraints    COGNITION:           Overall cognitive status: Within functional limits for tasks assessed                          SENSATION: Proprioception: Impaired in feet    EDEMA:  None    MUSCLE LENGTH: Hamstrings: Passive SLR to 40 deg bilateral  Thomas test: Right NT deg; Left NT deg   POSTURE: rounded shoulders, forward head, decreased lumbar lordosis, increased thoracic kyphosis, and posterior pelvic tilt   PALPATION: Rt glute medius sore, painful to palpation and just beside L5   LOWER EXTREMITY ROM:   Active ROM Right eval Left eval  Hip flexion      Hip extension      Hip abduction      Hip adduction      Hip internal rotation      Hip external rotation      Knee flexion      Knee extension      Ankle dorsiflexion      Ankle plantarflexion      Ankle inversion      Ankle eversion       (Blank rows = not tested)   LOWER EXTREMITY MMT:   MMT Right eval Left eval  Hip flexion 5 5  Hip extension      Hip abduction 3 3  Hip adduction      Hip internal rotation      Hip external rotation      Knee flexion 5 5  Knee extension 5 5  Ankle dorsiflexion 5 5  Ankle plantarflexion      Ankle inversion      Ankle eversion       (Blank rows = not tested)   LOWER EXTREMITY SPECIAL TESTS:  Hip special tests: Trendelenburg test: positive pos bilateral    FUNCTIONAL TESTS:  5 times sit to stand: 7.8 sec  2 minute walk test: NT on eval  SLS    GAIT: Distance walked: 150 Assistive device utilized:  none  Level of assistance: Modified independence Comments: irregular pattern of gait when distracted, turning head.  No notable foot drop but poor push off.        TODAY'S TREATMENT: PT  eval, discussed organizing HEP, rationale for exercises (balance vs strength vs flexibility) Pilates, core, breathing (intro, basics) and vs Yoga    [facility] Adult PT Treatment:  DATE: 02/10/22 Therapeutic Exercise: Pilates Reformer used for LE/core strength, postural strength, lumbopelvic disassociation and core control.  Exercises included: Footwork 2 red 1 blue 1 yellow, double and single  Parallel and then wide ER/ABD 2 red 1 blue for single leg and double heel raises x 10 each  Bridging same springs Modified for hamstring cramping with feet on platform  L stretch on long box for thoracic extension and hamstring stretching   Self Care: Hamstring cramping - weakness vs tightness  Zone 2 cardio  150 min    PATIENT EDUCATION:  Education details: see above treatment Person educated: Patient Education method: Explanation, Demonstration, and Verbal cues Education comprehension: verbalized understanding and needs further education     HOME EXERCISE PROGRAM: Has an extensive HEP.  Offered tips for streamlining and form with hip abd in sidelying.  Changed reps and pacing.    ASSESSMENT:   CLINICAL IMPRESSION:   Patient able to perform introductory Pilates exercises to familiarize himself with the concepts and exercises. He experienced hamstring cramping with bridging and LEs elevated. Neuropathy in feet also were a barrier to his stability.  He did realize that the Pilates equipment may be just what he needs to train core.    OBJECTIVE IMPAIRMENTS Abnormal gait, decreased balance, decreased coordination, decreased mobility, difficulty walking, decreased ROM, decreased strength, hypomobility, increased fascial restrictions, impaired flexibility, impaired sensation, improper body mechanics, postural dysfunction, and pain.    ACTIVITY LIMITATIONS carrying, lifting, bending, sitting, squatting, sleeping, and locomotion level   PARTICIPATION  LIMITATIONS: interpersonal relationship, driving, and community activity   PERSONAL FACTORS Past/current experiences and 1-2 comorbidities: AS, back pain  are also affecting patient's functional outcome.    REHAB POTENTIAL: Excellent   CLINICAL DECISION MAKING: Stable/uncomplicated   EVALUATION COMPLEXITY: Low     GOALS: Goals reviewed with patient? Yes   SHORT TERM GOALS: Target date: 03/04/2022  Patient will notice less pain in Rt hip during light cardio/elliptical Baseline: does 30 min with hip pain, moderate  Goal status: INITIAL   2.  Pt will be able to show I with HEP which takes no more than 30 min per day Baseline: 45-60 min Goal status: INITIAL   3.  Pt will be able to complete balance screen and goal set for long term fall risk mgmt Baseline:  Goal status: INITIAL     LONG TERM GOALS: Target date: 04/01/2022    Patient will be able to sit for meals and recreation , > 30 min with min pain when standing  Baseline:  Goal status: INITIAL   2.  Pt will be able to demo strength in hip abduction to 4/5 and show no hip drop in SLS  Baseline: 3/5 Goal status: INITIAL   3.  Pt will be able to get up and down from the floor independently with ease  Baseline:  Goal status: INITIAL   4.  Pt will be able to improve balance based on baseline screening  Baseline: TBA Goal status: INITIAL   5.  Pt will be I with HEP for core, hips upon discharge  Baseline:  Goal status: INITIAL   6.  FOTO TBA, emailed the patient on eval due to time  Baseline:  Goal status: INITIAL     PLAN: PT FREQUENCY: 1x/week   PT DURATION: 8 weeks   PLANNED INTERVENTIONS: Therapeutic exercises, Therapeutic activity, Neuromuscular re-education, Balance training, Gait training, Patient/Family education, Self Care, Joint mobilization, Aquatic Therapy, Dry Needling, Cryotherapy, Manual therapy, and Re-evaluation PILATES  PLAN FOR NEXT SESSION: intro to Reformer/Tower  Basics, core activation and  breathing.        Raeford Razor, PT 02/10/22 4:44 PM Phone: [phone] Fax: [phone]  Analysse Quinonez, PT 02/10/2022, 4:41 PM

## 2022-02-16 NOTE — Therapy (Unsigned)
OUTPATIENT PHYSICAL THERAPY TREATMENT NOTE   Patient Name: Wesley [name]. MRN: [id number] DOB:07/14/1941, 81 y.o., male Today's Date: 02/17/2022  PCP: Shon Baton, MD  REFERRING PROVIDER: Dr.  Oneida Alar  END OF SESSION:   PT End of Session - 02/17/22 1102     Visit Number 3    Number of Visits 8    Date for PT Re-Evaluation 04/01/22    Authorization Type Medicare/Tricare    PT Start Time 1100    PT Stop Time 1147    PT Time Calculation (min) 47 min    Activity Tolerance Patient tolerated treatment well    Behavior During Therapy WFL for tasks assessed/performed              Past Medical History:  Diagnosis Date   A-fib ([place])    cardioversion x2   Ankylosing spondylitis ([place])    Atrial flutter ([facility])    GERD (gastroesophageal reflux disease)    Hemorrhoids    Irregular heart beat    MVP (mitral valve prolapse)    WITH A MIDSYSTOLIC CLICK   OSA (obstructive sleep apnea)    moderate OSA NPSG 10/24/09 - AHO 21.8/hr   Peripheral neuropathy    Vitamin B12 deficiency    Past Surgical History:  Procedure Laterality Date   ABLATION     CARDIOVERSION N/A 12/20/2019   Procedure: CARDIOVERSION;  Surgeon: Jerline Pain, MD;  Location: [facility];  Service: Cardiovascular;  Laterality: N/A;   COLONOSCOPY     INGUINAL HERNIA REPAIR     venous ligation     for varices left lower leg   Patient Active Problem List   Diagnosis Date Noted   Abnormality of gait 06/26/2021   Chronic hoarseness 10/04/2020   PAF (paroxysmal atrial fibrillation) ([place])    DDD (degenerative disc disease), lumbar 10/28/2017   Bilateral bunions 08/20/2016   Varicose veins of right lower extremity with complications 86/76/1950   Bruit 10/10/2015   Varicose veins of leg with complications 93/26/7124   Ankylosing spondylitis ([place]) 01/14/2015   Chronic anticoagulation 01/14/2015   Sleep apnea 01/14/2015   S/P ablation of atrial fibrillation 01/08/2015   Persistent atrial fibrillation ([place])  01/08/2015   Orthostatic dizziness 12/01/2013   Calf pain 11/22/2012   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/22/2012   Peripheral neuropathy 10/21/2012   Pain in joint, ankle and foot 09/21/2012   Disturbance of skin sensation 09/21/2012   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 ([place]) 09/21/2012   Other general symptoms(780.99) 09/21/2012   Heel pain 06/07/2012   Plantar fasciitis 06/07/2012   Mitral valve prolapse 06/03/2011   Arthritis pain of shoulder 04/03/2011   Rotator cuff syndrome of right shoulder 04/03/2011   Paroxysmal atrial fibrillation ([place]) 01/20/2011   Hypercholesterolemia 01/20/2011   OBSTRUCTIVE SLEEP APNEA 10/08/2009    REFERRING DIAG: Diagnosis M25.551 (ICD-10-CM) - Right hip pain 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  He has had a long issue with balance issues as well.  He has seen Dr. Oneida Alar for this in the past.  He is working with physical therapist Burnis Medin on these.  He also is doing some neuro physical therapy for balance issues.  The back and the balance issues have been doing better,  however over the last 3 weeks or so he is having a nagging pain on the right posterior hip and buttock.  He denies any numbness tingling or shooting pains down the leg.  He denies the leg giving out.  He had she has no pain at night or when walking, but will get pain when he is sitting.  He denies any redness, swelling, no fever or chills.   PRECAUTIONS: none  SUBJECTIVE: I am feeling good.  Donnald Garre been doing the exercises and I have a good routine.  I can feel a little soreness but barely a 1/10.  The sit to stand aggravates it a little.   PAIN:  Are you  having pain? Yes: NPRS scale: 1/10 Pain location: low back  Pain description: tight  Aggravating factors: bending  Relieving factors: exercises    OBJECTIVE: (objective measures completed at initial evaluation unless otherwise dated)  OBJECTIVE:    DIAGNOSTIC FINDINGS: none recent    PATIENT SURVEYS:  FOTO emailed to patient on eval due to time constraints    COGNITION:           Overall cognitive status: Within functional limits for tasks assessed                          SENSATION: Proprioception: Impaired in feet    EDEMA:  None    MUSCLE LENGTH: Hamstrings: Passive SLR to 40 deg bilateral  Thomas test: Right NT deg; Left NT deg   POSTURE: rounded shoulders, forward head, decreased lumbar lordosis, increased thoracic kyphosis, and posterior pelvic tilt   PALPATION: Rt glute medius sore, painful to palpation and just beside L5   LOWER EXTREMITY ROM:   Active ROM Right eval Left eval  Hip flexion      Hip extension      Hip abduction      Hip adduction      Hip internal rotation      Hip external rotation      Knee flexion      Knee extension      Ankle dorsiflexion      Ankle plantarflexion      Ankle inversion      Ankle eversion       (Blank rows = not tested)   LOWER EXTREMITY MMT:   MMT Right eval Left eval  Hip flexion 5 5  Hip extension      Hip abduction 3 3  Hip adduction      Hip internal rotation      Hip external rotation      Knee flexion 5 5  Knee extension 5 5  Ankle dorsiflexion 5 5  Ankle plantarflexion      Ankle inversion      Ankle eversion       (Blank rows = not tested)   LOWER EXTREMITY SPECIAL TESTS:  Hip special tests: Trendelenburg test: positive pos bilateral    FUNCTIONAL TESTS:  5 times sit to stand: 7.8 sec  2 minute walk test: NT on eval  SLS    GAIT: Distance walked: 150 Assistive device utilized:  none  Level of assistance: Modified independence Comments: irregular pattern of gait when distracted,  turning head.  No notable foot drop but poor push off.        TODAY'S TREATMENT: PT eval, discussed organizing HEP, rationale for exercises (balance vs strength vs flexibility) Pilates, core, breathing (intro, basics) and vs Yoga   [facility] Adult  PT Treatment:                                                DATE: 02/17/22 Therapeutic Exercise: Hip abduction exercises, HEP review Banded bridges x 10 and clam x 10 each , GTB looped  Sidelying hip abduction x 10 each  Quadruped hip extension x 10 , mod on forearms  Sit to stand with looped band x 10  Seated hamstring x 2 , 30 sec    Therapeutic Activity/Balance testing:  DGI 21/24      [facility] Adult PT Treatment:                                                DATE: 02/10/22 Therapeutic Exercise: Pilates Reformer used for LE/core strength, postural strength, lumbopelvic disassociation and core control.  Exercises included: Footwork 2 red 1 blue 1 yellow, double and single  Parallel and then wide ER/ABD 2 red 1 blue for single leg and double heel raises x 10 each  Bridging same springs Modified for hamstring cramping with feet on platform  L stretch on long box for thoracic extension and hamstring stretching   Self Care: Hamstring cramping - weakness vs tightness  Zone 2 cardio  150 min    PATIENT EDUCATION:  Education details: see above treatment Person educated: Patient Education method: Explanation, Demonstration, and Verbal cues Education comprehension: verbalized understanding and needs further education     HOME EXERCISE PROGRAM: Has an extensive HEP.  Offered tips for streamlining and form with hip abd in sidelying.  Changed reps and pacing.   Access Code: [email or web address] URL: [email or web address] Date: 02/17/2022 Prepared by: Raeford Razor  Exercises - Supine Bridge with Resistance Band  - 1 x daily - 7 x weekly - 2 sets - 10 reps - 5 hold - Bridge with Hip Abduction and Resistance  - 1 x daily - 7 x weekly - 2  sets - 10 reps - 30 hold - Clamshell at Wall in Hip Extension  - 1 x daily - 7 x weekly - 2 sets - 10 reps - 5 hold - Sit to Stand with Resistance Around Legs  - 1 x daily - 7 x weekly - 2 sets - 10 reps - 30 hold - Seated Hamstring Stretch  - 1 x daily - 7 x weekly - 1 sets - 3-5 reps - 30 hold  ASSESSMENT:   CLINICAL IMPRESSION:  Patient able to review and establish HEP streamlined for convenience.  He was able to perform DGI well with difficulty with head turned and quick steps (pivot turn). Marked trunk flexion when looking down.  Will pick back up with Pilates next visit.    OBJECTIVE IMPAIRMENTS Abnormal gait, decreased balance, decreased coordination, decreased mobility, difficulty walking, decreased ROM, decreased strength, hypomobility, increased fascial restrictions, impaired flexibility, impaired sensation, improper body mechanics, postural dysfunction, and pain.    ACTIVITY LIMITATIONS carrying, lifting, bending, sitting, squatting, sleeping, and locomotion level   PARTICIPATION LIMITATIONS: interpersonal relationship, driving, and community activity   PERSONAL FACTORS Past/current experiences and 1-2 comorbidities: AS, back pain  are also affecting patient's functional outcome.    REHAB POTENTIAL: Excellent   CLINICAL DECISION MAKING: Stable/uncomplicated   EVALUATION COMPLEXITY: Low  GOALS: Goals reviewed with patient? Yes   SHORT TERM GOALS: Target date: 03/04/2022  Patient will notice less pain in Rt hip during light cardio/elliptical Baseline: does 30 min with hip pain, moderate  Goal status: INITIAL   2.  Pt will be able to show I with HEP which takes no more than 30 min per day Baseline: 45-60 min Goal status: INITIAL   3.  Pt will be able to complete balance screen and goal set for long term fall risk mgmt Baseline:  Goal status: INITIAL     LONG TERM GOALS: Target date: 04/01/2022    Patient will be able to sit for meals and recreation , > 30 min with  min pain when standing  Baseline:  Goal status: INITIAL   2.  Pt will be able to demo strength in hip abduction to 4/5 and show no hip drop in SLS  Baseline: 3/5 Goal status: INITIAL   3.  Pt will be able to get up and down from the floor independently with ease  Baseline:  Goal status: INITIAL   4.  Pt will be able to improve balance based on baseline screening  Baseline: TBA Goal status: INITIAL   5.  Pt will be I with HEP for core, hips upon discharge  Baseline:  Goal status: INITIAL   6.  FOTO TBA, emailed the patient on eval due to time  Baseline:  Goal status: INITIAL     PLAN: PT FREQUENCY: 1x/week   PT DURATION: 8 weeks   PLANNED INTERVENTIONS: Therapeutic exercises, Therapeutic activity, Neuromuscular re-education, Balance training, Gait training, Patient/Family education, Self Care, Joint mobilization, Aquatic Therapy, Dry Needling, Cryotherapy, Manual therapy, and Re-evaluation PILATES    PLAN FOR NEXT SESSION: GOALS, intro to Reformer/Tower  Basics, core activation and breathing.      Raeford Razor, PT 02/17/22 12:42 PM Phone: [phone] Fax: [phone]

## 2022-02-17 ENCOUNTER — Ambulatory Visit: Payer: Medicare Other | Admitting: Physical Therapy

## 2022-02-17 ENCOUNTER — Encounter: Payer: Self-pay | Admitting: Physical Therapy

## 2022-02-17 DIAGNOSIS — M25551 Pain in right hip: Secondary | ICD-10-CM

## 2022-02-17 DIAGNOSIS — R2689 Other abnormalities of gait and mobility: Secondary | ICD-10-CM | POA: Diagnosis not present

## 2022-02-17 DIAGNOSIS — R2681 Unsteadiness on feet: Secondary | ICD-10-CM

## 2022-02-17 DIAGNOSIS — M6281 Muscle weakness (generalized): Secondary | ICD-10-CM | POA: Diagnosis not present

## 2022-02-17 DIAGNOSIS — G603 Idiopathic progressive neuropathy: Secondary | ICD-10-CM | POA: Diagnosis not present

## 2022-02-23 DIAGNOSIS — D485 Neoplasm of uncertain behavior of skin: Secondary | ICD-10-CM | POA: Diagnosis not present

## 2022-02-23 DIAGNOSIS — L821 Other seborrheic keratosis: Secondary | ICD-10-CM | POA: Diagnosis not present

## 2022-02-23 DIAGNOSIS — L2389 Allergic contact dermatitis due to other agents: Secondary | ICD-10-CM | POA: Diagnosis not present

## 2022-02-24 ENCOUNTER — Ambulatory Visit: Payer: Medicare Other | Attending: Sports Medicine | Admitting: Physical Therapy

## 2022-02-24 DIAGNOSIS — M25551 Pain in right hip: Secondary | ICD-10-CM

## 2022-02-24 DIAGNOSIS — M6281 Muscle weakness (generalized): Secondary | ICD-10-CM | POA: Diagnosis not present

## 2022-02-24 DIAGNOSIS — R2681 Unsteadiness on feet: Secondary | ICD-10-CM | POA: Diagnosis not present

## 2022-02-24 DIAGNOSIS — R2689 Other abnormalities of gait and mobility: Secondary | ICD-10-CM | POA: Diagnosis not present

## 2022-02-24 NOTE — Therapy (Signed)
OUTPATIENT PHYSICAL THERAPY TREATMENT NOTE   Patient Name: Wesley [name]. MRN: [id number] DOB:1940-11-29, 81 y.o., male Today's Date: 02/24/2022  PCP: Shon Baton, MD  REFERRING PROVIDER: Dr.  Oneida Alar  END OF SESSION:   PT End of Session - 02/24/22 1029     Visit Number 4    Number of Visits 8    Date for PT Re-Evaluation 04/01/22    Authorization Type Medicare/Tricare    PT Start Time 1015    PT Stop Time 1108    PT Time Calculation (min) 53 min    Activity Tolerance Patient tolerated treatment well    Behavior During Therapy WFL for tasks assessed/performed               Past Medical History:  Diagnosis Date   A-fib ([place])    cardioversion x2   Ankylosing spondylitis ([place])    Atrial flutter ([facility])    GERD (gastroesophageal reflux disease)    Hemorrhoids    Irregular heart beat    MVP (mitral valve prolapse)    WITH A MIDSYSTOLIC CLICK   OSA (obstructive sleep apnea)    moderate OSA NPSG 10/24/09 - AHO 21.8/hr   Peripheral neuropathy    Vitamin B12 deficiency    Past Surgical History:  Procedure Laterality Date   ABLATION     CARDIOVERSION N/A 12/20/2019   Procedure: CARDIOVERSION;  Surgeon: Jerline Pain, MD;  Location: [facility];  Service: Cardiovascular;  Laterality: N/A;   COLONOSCOPY     INGUINAL HERNIA REPAIR     venous ligation     for varices left lower leg   Patient Active Problem List   Diagnosis Date Noted   Abnormality of gait 06/26/2021   Chronic hoarseness 10/04/2020   PAF (paroxysmal atrial fibrillation) ([place])    DDD (degenerative disc disease), lumbar 10/28/2017   Bilateral bunions 08/20/2016   Varicose veins of right lower extremity with complications 01/74/9449   Bruit 10/10/2015   Varicose veins of leg with complications 67/59/1638   Ankylosing spondylitis ([place]) 01/14/2015   Chronic anticoagulation 01/14/2015   Sleep apnea 01/14/2015   S/P ablation of atrial fibrillation 01/08/2015   Persistent atrial fibrillation ([place])  01/08/2015   Orthostatic dizziness 12/01/2013   Calf pain 11/22/2012   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/22/2012   Peripheral neuropathy 10/21/2012   Pain in joint, ankle and foot 09/21/2012   Disturbance of skin sensation 09/21/2012   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 ([place]) 09/21/2012   Other general symptoms(780.99) 09/21/2012   Heel pain 06/07/2012   Plantar fasciitis 06/07/2012   Mitral valve prolapse 06/03/2011   Arthritis pain of shoulder 04/03/2011   Rotator cuff syndrome of right shoulder 04/03/2011   Paroxysmal atrial fibrillation ([place]) 01/20/2011   Hypercholesterolemia 01/20/2011   OBSTRUCTIVE SLEEP APNEA 10/08/2009    REFERRING DIAG: Diagnosis M25.551 (ICD-10-CM) - Right hip pain 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  He has had a long issue with balance issues as well.  He has seen Dr. Oneida Alar for this in the past.  He is working with physical therapist Burnis Medin on these.  He also is doing some neuro physical therapy for balance issues.  The back and the balance issues have been doing  better, however over the last 3 weeks or so he is having a nagging pain on the right posterior hip and buttock.  He denies any numbness tingling or shooting pains down the leg.  He denies the leg giving out.  He had she has no pain at night or when walking, but will get pain when he is sitting.  He denies any redness, swelling, no fever or chills.   PRECAUTIONS: none  SUBJECTIVE: I have some more questions about the exercises.  No pain . I notice after sitting it is hard to stand up.   PAIN:  Are you having pain? Yes: NPRS scale: 1/10 Pain location: low back   Pain description: tight  Aggravating factors: bending  Relieving factors: exercises    OBJECTIVE: (objective measures completed at initial evaluation unless otherwise dated)  OBJECTIVE:    DIAGNOSTIC FINDINGS: none recent    PATIENT SURVEYS:  FOTO emailed to patient on eval due to time constraints    COGNITION:           Overall cognitive status: Within functional limits for tasks assessed                          SENSATION: Proprioception: Impaired in feet    EDEMA:  None    MUSCLE LENGTH: Hamstrings: Passive SLR to 40 deg bilateral  Thomas test: Right NT deg; Left NT deg   POSTURE: rounded shoulders, forward head, decreased lumbar lordosis, increased thoracic kyphosis, and posterior pelvic tilt   PALPATION: Rt glute medius sore, painful to palpation and just beside L5   LOWER EXTREMITY ROM:   Active ROM Right eval Left eval  Hip flexion      Hip extension      Hip abduction      Hip adduction      Hip internal rotation      Hip external rotation      Knee flexion      Knee extension      Ankle dorsiflexion      Ankle plantarflexion      Ankle inversion      Ankle eversion       (Blank rows = not tested)   LOWER EXTREMITY MMT:   MMT Right eval Left eval  Hip flexion 5 5  Hip extension      Hip abduction 3 3  Hip adduction      Hip internal rotation      Hip external rotation      Knee flexion 5 5  Knee extension 5 5  Ankle dorsiflexion 5 5  Ankle plantarflexion      Ankle inversion      Ankle eversion       (Blank rows = not tested)   LOWER EXTREMITY SPECIAL TESTS:  Hip special tests: Trendelenburg test: positive pos bilateral    FUNCTIONAL TESTS:  5 times sit to stand: 7.8 sec  2 minute walk test: NT on eval  SLS    GAIT: Distance walked: 150 Assistive device utilized:  none  Level of assistance: Modified independence Comments: irregular pattern of gait when distracted, turning head.  No notable foot drop but poor push off.         TODAY'S TREATMENT: PT eval, discussed organizing HEP, rationale for exercises (balance vs strength vs flexibility) Pilates, core, breathing (intro, basics) and vs Yoga    [facility] Adult PT Treatment:  DATE: 02/24/22 Therapeutic Exercise: Bridging with blue band x 10  Bridge with clam x 10  Sidelying clam x 10  Sidelying hydrant x 10  Sit to stand blue band x 10 Sit to stand 15 lbs x 15 Squat 15 lbs x 10 min cues  RDL 15 lbs x 10 multiple cues  Self Care: HEP guidance How to make gym sessions more efficient  Posterior chain work Glute activation   [facility] Adult PT Treatment:                                                DATE: 02/17/22 Therapeutic Exercise: Hip abduction exercises, HEP review Banded bridges x 10 and clam x 10 each , GTB looped  Sidelying hip abduction x 10 each  Quadruped hip extension x 10 , mod on forearms  Sit to stand with looped band x 10  Seated hamstring x 2 , 30 sec    Therapeutic Activity/Balance testing:  DGI 21/24      [facility] Adult PT Treatment:                                                DATE: 02/10/22 Therapeutic Exercise: Pilates Reformer used for LE/core strength, postural strength, lumbopelvic disassociation and core control.  Exercises included: Footwork 2 red 1 blue 1 yellow, double and single  Parallel and then wide ER/ABD 2 red 1 blue for single leg and double heel raises x 10 each  Bridging same springs Modified for hamstring cramping with feet on platform  L stretch on long box for thoracic extension and hamstring stretching   Self Care: Hamstring cramping - weakness vs tightness  Zone 2 cardio  150 min    PATIENT EDUCATION:  Education details: see above treatment Person educated: Patient Education method: Explanation, Demonstration, and Verbal cues Education comprehension: verbalized understanding and needs further education     HOME EXERCISE PROGRAM: Has an extensive HEP.   Offered tips for streamlining and form with hip abd in sidelying.  Changed reps and pacing.   Access Code: [email or web address] URL: [email or web address] Date: 02/17/2022 Prepared by: Raeford Razor  Exercises - Supine Bridge with Resistance Band  - 1 x daily - 7 x weekly - 2 sets - 10 reps - 5 hold - Bridge with Hip Abduction and Resistance  - 1 x daily - 7 x weekly - 2 sets - 10 reps - 30 hold - Clamshell at Wall in Hip Extension  - 1 x daily - 7 x weekly - 2 sets - 10 reps - 5 hold - Sit to Stand with Resistance Around Legs  - 1 x daily - 7 x weekly - 2 sets - 10 reps - 30 hold - Seated Hamstring Stretch  - 1 x daily - 7 x weekly - 1 sets - 3-5 reps - 30 hold -RDL    ASSESSMENT:   CLINICAL IMPRESSION:     OBJECTIVE IMPAIRMENTS Abnormal gait, decreased balance, decreased coordination, decreased mobility, difficulty walking, decreased ROM, decreased strength, hypomobility, increased fascial restrictions, impaired flexibility, impaired sensation, improper body mechanics, postural dysfunction, and pain.    ACTIVITY LIMITATIONS carrying, lifting, bending, sitting, squatting, sleeping, and locomotion level   PARTICIPATION LIMITATIONS: interpersonal relationship, driving, and community  activity   PERSONAL FACTORS Past/current experiences and 1-2 comorbidities: AS, back pain  are also affecting patient's functional outcome.    REHAB POTENTIAL: Excellent   CLINICAL DECISION MAKING: Stable/uncomplicated   EVALUATION COMPLEXITY: Low     GOALS: Goals reviewed with patient? Yes   SHORT TERM GOALS: Target date: 03/04/2022  Patient will notice less pain in Rt hip during light cardio/elliptical Baseline: does 30 min with hip pain, moderate  Goal status: MET   2.  Pt will be able to show I with HEP which takes no more than 30 min per day Baseline: 45-60 min Goal status: MET   3.  Pt will be able to complete balance screen and goal set for long term fall risk mgmt Baseline: DGI  21/24 Goal status: MET     LONG TERM GOALS: Target date: 04/01/2022    Patient will be able to sit for meals and recreation , > 30 min with min pain when standing  Baseline:  Goal status: INITIAL   2.  Pt will be able to demo strength in hip abduction to 4/5 and show no hip drop in SLS  Baseline: 3/5 Goal status: INITIAL   3.  Pt will be able to get up and down from the floor independently with ease  Baseline:  Goal status: INITIAL   4.  Pt will be able to improve balance based on baseline screening : Patient will stand on 1 leg for > 15 sec with good lateral hip actvation.  Baseline: Trendelenburg Goal status: NEW    5.  Pt will be I with HEP for core, hips upon discharge  Baseline:  Goal status: INITIAL   6.  FOTO (done 02/24/22) will improve to 67% Baseline: 59% Goal status: INITIAL     PLAN: PT FREQUENCY: 1x/week   PT DURATION: 8 weeks   PLANNED INTERVENTIONS: Therapeutic exercises, Therapeutic activity, Neuromuscular re-education, Balance training, Gait training, Patient/Family education, Self Care, Joint mobilization, Aquatic Therapy, Dry Needling, Cryotherapy, Manual therapy, and Re-evaluation PILATES    PLAN FOR NEXT SESSION:  Review hinging Standing core, extension , upper back/posture  Basics, core activation and breathing.      Raeford Razor, PT 02/24/22 12:18 PM Phone: [phone] Fax: [phone]

## 2022-03-02 NOTE — Therapy (Unsigned)
OUTPATIENT PHYSICAL THERAPY TREATMENT NOTE   Patient Name: Wesley [name]. MRN: [id number] DOB:02/15/1941, 81 y.o., male Today's Date: 03/03/2022  PCP: Shon Baton, MD  REFERRING PROVIDER: Dr.  Oneida Alar  END OF SESSION:   PT End of Session - 03/03/22 0937     Visit Number 5    Number of Visits 8    Date for PT Re-Evaluation 04/01/22    Authorization Type Medicare/Tricare    PT Start Time 0934    PT Stop Time 1015    PT Time Calculation (min) 41 min    Activity Tolerance Patient tolerated treatment well    Behavior During Therapy WFL for tasks assessed/performed                Past Medical History:  Diagnosis Date   A-fib ([place])    cardioversion x2   Ankylosing spondylitis ([place])    Atrial flutter ([facility])    GERD (gastroesophageal reflux disease)    Hemorrhoids    Irregular heart beat    MVP (mitral valve prolapse)    WITH A MIDSYSTOLIC CLICK   OSA (obstructive sleep apnea)    moderate OSA NPSG 10/24/09 - AHO 21.8/hr   Peripheral neuropathy    Vitamin B12 deficiency    Past Surgical History:  Procedure Laterality Date   ABLATION     CARDIOVERSION N/A 12/20/2019   Procedure: CARDIOVERSION;  Surgeon: Jerline Pain, MD;  Location: [facility];  Service: Cardiovascular;  Laterality: N/A;   COLONOSCOPY     INGUINAL HERNIA REPAIR     venous ligation     for varices left lower leg   Patient Active Problem List   Diagnosis Date Noted   Abnormality of gait 06/26/2021   Chronic hoarseness 10/04/2020   PAF (paroxysmal atrial fibrillation) ([place])    DDD (degenerative disc disease), lumbar 10/28/2017   Bilateral bunions 08/20/2016   Varicose veins of right lower extremity with complications 40/98/1191   Bruit 10/10/2015   Varicose veins of leg with complications 47/82/9562   Ankylosing spondylitis ([place]) 01/14/2015   Chronic anticoagulation 01/14/2015   Sleep apnea 01/14/2015   S/P ablation of atrial fibrillation 01/08/2015   Persistent atrial fibrillation ([place])  01/08/2015   Orthostatic dizziness 12/01/2013   Calf pain 11/22/2012   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/22/2012   Peripheral neuropathy 10/21/2012   Pain in joint, ankle and foot 09/21/2012   Disturbance of skin sensation 09/21/2012   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 ([place]) 09/21/2012   Other general symptoms(780.99) 09/21/2012   Heel pain 06/07/2012   Plantar fasciitis 06/07/2012   Mitral valve prolapse 06/03/2011   Arthritis pain of shoulder 04/03/2011   Rotator cuff syndrome of right shoulder 04/03/2011   Paroxysmal atrial fibrillation ([place]) 01/20/2011   Hypercholesterolemia 01/20/2011   OBSTRUCTIVE SLEEP APNEA 10/08/2009    REFERRING DIAG: Diagnosis M25.551 (ICD-10-CM) - Right hip pain 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  He has had a long issue with balance issues as well.  He has seen Dr. Oneida Alar for this in the past.  He is working with physical therapist Burnis Medin on these.  He also is doing some neuro physical therapy for balance issues.  The back and the balance issues have been  doing better, however over the last 3 weeks or so he is having a nagging pain on the right posterior hip and buttock.  He denies any numbness tingling or shooting pains down the leg.  He denies the leg giving out.  He had she has no pain at night or when walking, but will get pain when he is sitting.  He denies any redness, swelling, no fever or chills.   PRECAUTIONS: none  SUBJECTIVE: I can tell my core is starting to kick in.  No pain right now .  PAIN:  Are you having pain? Yes: NPRS scale: 1/10 Pain location: low back  Pain description: tight  Aggravating factors:  bending  Relieving factors: exercises    OBJECTIVE: (objective measures completed at initial evaluation unless otherwise dated)  OBJECTIVE:    DIAGNOSTIC FINDINGS: none recent    PATIENT SURVEYS:  FOTO emailed to patient on eval due to time constraints    COGNITION:           Overall cognitive status: Within functional limits for tasks assessed                          SENSATION: Proprioception: Impaired in feet    EDEMA:  None    MUSCLE LENGTH: Hamstrings: Passive SLR to 40 deg bilateral  Thomas test: Right NT deg; Left NT deg   POSTURE: rounded shoulders, forward head, decreased lumbar lordosis, increased thoracic kyphosis, and posterior pelvic tilt   PALPATION: Rt glute medius sore, painful to palpation and just beside L5   LOWER EXTREMITY ROM:   Active ROM Right eval Left eval  Hip flexion      Hip extension      Hip abduction      Hip adduction      Hip internal rotation      Hip external rotation      Knee flexion      Knee extension      Ankle dorsiflexion      Ankle plantarflexion      Ankle inversion      Ankle eversion       (Blank rows = not tested)   LOWER EXTREMITY MMT:   MMT Right eval Left eval  Hip flexion 5 5  Hip extension      Hip abduction 3 3  Hip adduction      Hip internal rotation      Hip external rotation      Knee flexion 5 5  Knee extension 5 5  Ankle dorsiflexion 5 5  Ankle plantarflexion      Ankle inversion      Ankle eversion       (Blank rows = not tested)   LOWER EXTREMITY SPECIAL TESTS:  Hip special tests: Trendelenburg test: positive pos bilateral    FUNCTIONAL TESTS:  5 times sit to stand: 7.8 sec  2 minute walk test: NT on eval  SLS    GAIT: Distance walked: 150 Assistive device utilized:  none  Level of assistance: Modified independence Comments: irregular pattern of gait when distracted, turning head.  No notable foot drop but poor push off.        TODAY'S TREATMENT: PT eval, discussed  organizing HEP, rationale for exercises (balance vs strength vs flexibility) Pilates, core, breathing (intro, basics) and vs Yoga    [facility] Adult PT Treatment:  DATE: 03/03/22 Therapeutic Exercise: Elliptical 5 min Level 2 ramp and level 10 resist.  Hamstring stretch 2 x 30 sec  Hip hinging dowel , stick down thighs and then finally 15 lbs x 10 each (shoulder pain with dowel)  Split squat , touch knee to Airex x 10 each side used hands beside on chair, iso hold last rep no UEs Wall glides (towel) x 10 each , 15 lbs  Supine figure 4, knee press out  Ball bridges knees extended x 10  Single leg glute bridge x 10  Self Care: Continual feedback regarding form and rationale    [facility] Adult PT Treatment:                                                DATE: 02/24/22 Therapeutic Exercise: Bridging with blue band x 10  Bridge with clam x 10  Sidelying clam x 10  Sidelying hydrant x 10  Sit to stand blue band x 10 Sit to stand 15 lbs x 15 Squat 15 lbs x 10 min cues  RDL 15 lbs x 10 multiple cues  Self Care: HEP guidance How to make gym sessions more efficient  Posterior chain work Glute activation   [facility] Adult PT Treatment:                                                DATE: 02/17/22 Therapeutic Exercise: Hip abduction exercises, HEP review Banded bridges x 10 and clam x 10 each , GTB looped  Sidelying hip abduction x 10 each  Quadruped hip extension x 10 , mod on forearms  Sit to stand with looped band x 10  Seated hamstring x 2 , 30 sec    Therapeutic Activity/Balance testing:  DGI 21/24      [facility] Adult PT Treatment:                                                DATE: 02/10/22 Therapeutic Exercise: Pilates Reformer used for LE/core strength, postural strength, lumbopelvic disassociation and core control.  Exercises included: Footwork 2 red 1 blue 1 yellow, double and single  Parallel and then wide ER/ABD 2 red 1 blue for single leg and  double heel raises x 10 each  Bridging same springs Modified for hamstring cramping with feet on platform  L stretch on long box for thoracic extension and hamstring stretching   Self Care: Hamstring cramping - weakness vs tightness  Zone 2 cardio  150 min    PATIENT EDUCATION:  Education details: see above treatment Person educated: Patient Education method: Explanation, Demonstration, and Verbal cues Education comprehension: verbalized understanding and needs further education     HOME EXERCISE PROGRAM: Has an extensive HEP.  Offered tips for streamlining and form with hip abd in sidelying.  Changed reps and pacing.    [id number]  ASSESSMENT:   CLINICAL IMPRESSION:     OBJECTIVE IMPAIRMENTS Abnormal gait, decreased balance, decreased coordination, decreased mobility, difficulty walking, decreased ROM, decreased strength, hypomobility, increased fascial restrictions, impaired flexibility, impaired sensation, improper body mechanics, postural dysfunction, and pain.  ACTIVITY LIMITATIONS carrying, lifting, bending, sitting, squatting, sleeping, and locomotion level   PARTICIPATION LIMITATIONS: interpersonal relationship, driving, and community activity   PERSONAL FACTORS Past/current experiences and 1-2 comorbidities: AS, back pain  are also affecting patient's functional outcome.    REHAB POTENTIAL: Excellent   CLINICAL DECISION MAKING: Stable/uncomplicated   EVALUATION COMPLEXITY: Low     GOALS: Goals reviewed with patient? Yes   SHORT TERM GOALS: Target date: 03/04/2022  Patient will notice less pain in Rt hip during light cardio/elliptical Baseline: does 30 min with hip pain, moderate  Goal status: MET   2.  Pt will be able to show I with HEP which takes no more than 30 min per day Baseline: 45-60 min Goal status: MET   3.  Pt will be able to complete balance screen and goal set for long term fall risk mgmt Baseline: DGI 21/24 Goal status: MET     LONG  TERM GOALS: Target date: 04/01/2022    Patient will be able to sit for meals and recreation , > 30 min with min pain when standing  Baseline:  Goal status: INITIAL   2.  Pt will be able to demo strength in hip abduction to 4/5 and show no hip drop in SLS  Baseline: 3/5 Goal status: INITIAL   3.  Pt will be able to get up and down from the floor independently with ease  Baseline:  Goal status: INITIAL   4.  Pt will be able to improve balance based on baseline screening : Patient will stand on 1 leg for > 15 sec with good lateral hip actvation.  Baseline: Trendelenburg Goal status: NEW    5.  Pt will be I with HEP for core, hips upon discharge  Baseline:  Goal status: INITIAL   6.  FOTO (done 02/24/22) will improve to 67% Baseline: 59% Goal status: INITIAL     PLAN: PT FREQUENCY: 1x/week   PT DURATION: 8 weeks   PLANNED INTERVENTIONS: Therapeutic exercises, Therapeutic activity, Neuromuscular re-education, Balance training, Gait training, Patient/Family education, Self Care, Joint mobilization, Aquatic Therapy, Dry Needling, Cryotherapy, Manual therapy, and Re-evaluation PILATES    PLAN FOR NEXT SESSION:  Review hinging Standing core, extension , upper back/posture  Basics, core activation and breathing.      Raeford Razor, PT 03/03/22 12:00 PM Phone: [phone] Fax: [phone]

## 2022-03-03 ENCOUNTER — Ambulatory Visit: Payer: Medicare Other | Admitting: Physical Therapy

## 2022-03-03 ENCOUNTER — Encounter: Payer: Self-pay | Admitting: Physical Therapy

## 2022-03-03 DIAGNOSIS — R2689 Other abnormalities of gait and mobility: Secondary | ICD-10-CM | POA: Diagnosis not present

## 2022-03-03 DIAGNOSIS — M6281 Muscle weakness (generalized): Secondary | ICD-10-CM | POA: Diagnosis not present

## 2022-03-03 DIAGNOSIS — R2681 Unsteadiness on feet: Secondary | ICD-10-CM | POA: Diagnosis not present

## 2022-03-03 DIAGNOSIS — M25551 Pain in right hip: Secondary | ICD-10-CM | POA: Diagnosis not present

## 2022-03-04 ENCOUNTER — Ambulatory Visit (INDEPENDENT_AMBULATORY_CARE_PROVIDER_SITE_OTHER): Payer: Medicare Other | Admitting: Physician Assistant

## 2022-03-04 ENCOUNTER — Telehealth: Payer: Self-pay | Admitting: Cardiology

## 2022-03-04 ENCOUNTER — Ambulatory Visit (INDEPENDENT_AMBULATORY_CARE_PROVIDER_SITE_OTHER): Payer: Medicare Other

## 2022-03-04 ENCOUNTER — Encounter: Payer: Self-pay | Admitting: Physician Assistant

## 2022-03-04 ENCOUNTER — Other Ambulatory Visit: Payer: Self-pay | Admitting: Physician Assistant

## 2022-03-04 VITALS — BP 100/60 | HR 72 | Ht 75.0 in | Wt 211.6 lb

## 2022-03-04 DIAGNOSIS — I480 Paroxysmal atrial fibrillation: Secondary | ICD-10-CM

## 2022-03-04 DIAGNOSIS — I951 Orthostatic hypotension: Secondary | ICD-10-CM

## 2022-03-04 DIAGNOSIS — I361 Nonrheumatic tricuspid (valve) insufficiency: Secondary | ICD-10-CM

## 2022-03-04 DIAGNOSIS — R42 Dizziness and giddiness: Secondary | ICD-10-CM

## 2022-03-04 NOTE — Telephone Encounter (Signed)
Returned call to patient c/o elevated HR/palpitations.  Reports feeling some "irregularities" yesterday but overnight he could tell something "wasn't right".    He woke up this morning and was dizzy and mildly clammy.  He checked his Kardia mobile and HR was 144 showing possible Afib.   BP checked on the phone-112/92, HR 143 (per [facility]).   He takes metoprolol succinate 25 mg at night and is complaint with Eliquis BID.   Reports this is the first episode since ablation in 2021.  No distress noted on the phone, reports feeling similar to when he woke up, mild dizziness.      Hx of afib, atrial tachycardia s/p ablation x 2, mvp, TR.    Marland Kitchen  Discussed with DOD, Dr. Ilsa Iha to take additional metoprolol 25 now.  Appt scheduled at 1005 with Melvenia Needles PA.     Patient aware and verbalized understanding.

## 2022-03-04 NOTE — Patient Instructions (Signed)
Medication Instructions:  No Changes *If you need a refill on your cardiac medications before your next appointment, please call your pharmacy*   Lab Work: BMET, CBC, Magnesium Level. Today. If you have labs (blood work) drawn today and your tests are completely normal, you will receive your results only by: [place] (if you have MyChart) OR A paper copy in the mail If you have any lab test that is abnormal or we need to change your treatment, we will call you to review the results.   Testing/Procedures: [address], [address]. Your physician has requested that you have an echocardiogram. Echocardiography is a painless test that uses sound waves to create images of your heart. It provides your doctor with information about the size and shape of your heart and how well your heart's chambers and valves are working. This procedure takes approximately one hour. There are no restrictions for this procedure.   ZIO AT Long term monitor-Live Telemetry  Your physician has requested you wear a ZIO patch monitor for 14 days.  This is a single patch monitor. [employer] supplies one patch monitor per enrollment. Additional  stickers are not available.  Please do not apply patch if you will be having a Nuclear Stress Test, Echocardiogram, Cardiac CT, MRI,  or Chest Xray during the period you would be wearing the monitor. The patch cannot be worn during  these tests. You cannot remove and re-apply the ZIO AT patch monitor.  Your ZIO patch monitor will be mailed 3 day USPS to your address on file. It may take 3-5 days to  receive your monitor after you have been enrolled.  Once you have received your monitor, please review the enclosed instructions. Your monitor has  already been registered assigning a specific monitor serial # to you.   Billing and Patient Assistance Program information  Wesley [name] has been supplied with any insurance information on record for billing. [employer] offers a  sliding scale Patient Assistance Program for patients without insurance, or whose  insurance does not completely cover the cost of the ZIO patch monitor. You must apply for the  Patient Assistance Program to qualify for the discounted rate. To apply, call [employer] at [phone],  select option 4, select option 2 , ask to apply for the Patient Assistance Program, (you can request an  interpreter if needed). [employer] will ask your household income and how many people are in your  household. [employer] will quote your out-of-pocket cost based on this information. They will also be able  to set up a 12 month interest free payment plan if needed.  Applying the monitor   Shave hair from upper left chest.  Hold the abrader disc by orange tab. Rub the abrader in 40 strokes over left upper chest as indicated in  your monitor instructions.  Clean area with 4 enclosed alcohol pads. Use all pads to ensure the area is cleaned thoroughly. Let  dry.  Apply patch as indicated in monitor instructions. Patch will be placed under collarbone on left side of  chest with arrow pointing upward.  Rub patch adhesive wings for 2 minutes. Remove the white label marked "1". Remove the white label  marked "2". Rub patch adhesive wings for 2 additional minutes.  While looking in a mirror, press and release button in center of patch. A small green light will flash 3-4  times. This will be your only indicator that the monitor has been turned on.  Do not shower for the  first 24 hours. You may shower after the first 24 hours.  Press the button if you feel a symptom. You will hear a small click. Record Date, Time and Symptom in  the Patient Log.   Starting the Gateway  In your kit there is a Hydrographic surveyor box the size of a cellphone. This is Airline pilot. It transmits all your  recorded data to [facility]. This box must always stay within 10 feet of you. Open the box and push the *  button. There will be a light that blinks  orange and then green a few times. When the light stops  blinking, the Gateway is connected to the ZIO patch. Call [employer] at [phone] to confirm your monitor is transmitting.  Returning your monitor  Remove your patch and place it inside the [place]. In the lower half of the Gateway there is a white  bag with prepaid postage on it. Place Gateway in bag and seal. Mail package back to [place] as soon as  possible. Your physician should have your final report approximately 7 days after you have mailed back  your monitor. Call [place] at [phone] if you have questions regarding your ZIO AT  patch monitor. Call them immediately if you see an orange light blinking on your monitor.  If your monitor falls off in less than 4 days, contact our Monitor department at [phone]. If your  monitor becomes loose or falls off after 4 days call [employer] at [phone] for suggestions on  securing your monitor  Follow-Up: At [facility], you and your health needs are our priority.  As part of our continuing mission to provide you with exceptional heart care, we have created designated Provider Care Teams.  These Care Teams include your primary Cardiologist (physician) and Advanced Practice Providers (APPs -  Physician Assistants and Nurse Practitioners) who all work together to provide you with the care you need, when you need it.   Your next appointment:   2 month(s)  The format for your next appointment:   In Person  Provider:   Caron Presume, PA-C   or, Kirk Ruths, MD .{

## 2022-03-04 NOTE — Progress Notes (Signed)
Cardiology Office Note:    Date:  03/04/2022   ID:  Wesley [name]., DOB 03-Jul-1941, MRN [id number]  PCP:  Wesley Baton, MD [place] Cardiologist: Wesley Ruths, MD   Reason for visit: A-fib with RVR and dizziness  History of Present Illness:    Wesley [name]. is a 81 y.o. male with a hx of atrial fibrillation status post A-fib ablation in July 2011 in [place], atrial tachycardia status post successful cardioversion in May 2021 and repeat ablation at [facility].    He last saw Dr. Stanford [name] on Dec 21, 2021.  He was doing well without palpitations, dyspnea exertion or chest [name].  Repeat 2D echo was recommended for known mild to moderate TR on echo in 2021.  Recommend follow-up in 1 year.  Today, patient states he has been feeling subtle arrhythmias since July.  Yesterday, he used his Jodelle Red for the first time which showed normal rhythm with a heart rate in 60s.  This morning he felt hot and clammy and Kardia showed heart rate 144 and possible atrial fibrillation.  He states the only thing different recently is yesterday he did PT and cardiovascular exercise for over 2 hours.  Otherwise he denies acute illness, fever & diarrhea.  He states his systolic blood pressure usually 100-110s; he states it has been lower since his ablation.  He denies significant lightheadedness now.  He does have a history of dizziness with standing.  Otherwise, patient states he did not have the echo done mentioned at last appointment.  He denies shortness of breath, chest [name], PND, orthopnea lower extremity edema.  No syncope.  Denies history of stroke and significant bleeding.  He does have a right conjunctival hemorrhage for last 4 to 5 days.    He has appointment with his EP Dr. Rolland [name] at [facility] in November.    Past Medical History:  Diagnosis Date   A-fib [facility])    cardioversion x2   Ankylosing spondylitis (HCC)    Atrial flutter (HCC)    GERD  (gastroesophageal reflux disease)    Hemorrhoids    Irregular heart beat    MVP (mitral valve prolapse)    WITH A MIDSYSTOLIC CLICK   OSA (obstructive sleep apnea)    moderate OSA NPSG 10/24/09 - AHO 21.8/hr   Peripheral neuropathy    Vitamin B12 deficiency     Past Surgical History:  Procedure Laterality Date   ABLATION     CARDIOVERSION N/A 12/20/2019   Procedure: CARDIOVERSION;  Surgeon: Wesley Pain, MD;  Location: [facility];  Service: Cardiovascular;  Laterality: N/A;   COLONOSCOPY     INGUINAL HERNIA REPAIR     venous ligation     for varices left lower leg    Current Medications: Current Meds  Medication Sig   acidophilus (RISAQUAD) CAPS capsule Take 1 capsule by mouth daily.   Alpha-Lipoic Acid 600 MG CAPS Take 600 mg by mouth daily.   Ascorbic Acid (VITAMIN C) 1000 MG tablet Take 1,000 mg by mouth daily.   b complex vitamins capsule Take 1 capsule by mouth daily.   Cyanocobalamin 5000 MCG SUBL Place 1 tablet under the tongue daily.    ELIQUIS 5 MG TABS tablet TAKE 1 TABLET TWICE A DAY   gabapentin (NEURONTIN) 300 MG capsule Take 324m, 3038m 30067mnd 600m18mr the last dose of the QID   Loperamide-Simethicone 2-125 MG TABS Take 1 tablet by mouth as needed.  metoprolol succinate (TOPROL-XL) 25 MG 24 hr tablet TAKE 1 TABLET DAILY   Multiple Vitamin (MULTIVITAMIN) tablet Take 1 tablet by mouth daily.   Multiple Vitamins-Minerals (PRESERVISION AREDS 2+MULTI VIT) CAPS Take 1 tablet by mouth daily.    NEXIUM 40 MG capsule TAKE 1 CAPSULE DAILY (Patient taking differently: Take 40 mg by mouth daily.)   NON FORMULARY Take 2 capsules by mouth daily. MITOQ   pyridOXINE (VITAMIN B-6) 50 MG tablet Take 50 mg by mouth 2 (two) times daily.   rosuvastatin (CRESTOR) 5 MG tablet Take 5 mg by mouth daily. Taking 1/2 tab every other day     Allergies:   Patient has no known allergies.   Social History   Socioeconomic History   Marital status: Married    Spouse name: Not on  file   Number of children: 2   Years of education: 18   Highest education level: Not on file  Occupational History   Occupation: Scientist, clinical (histocompatibility and immunogenetics): [employer]  Tobacco Use   Smoking status: Former    Packs/day: 0.00    Types: Cigarettes    Quit date: 07/27/1969    Years since quitting: 52.6   Smokeless tobacco: Never   Tobacco comments:    less than 1 pack/day  Vaping Use   Vaping Use: Never used  Substance and Sexual Activity   Alcohol use: Yes    Comment: 1-2 most days   Drug use: No   Sexual activity: Not on file  Other Topics Concern   Not on file  Social History Narrative   Not on file   Social Determinants of Health   Financial Resource Strain: Not on file  Food Insecurity: Not on file  Transportation Needs: Not on file  Physical Activity: Not on file  Stress: Not on file  Social Connections: Not on file     Family History: The patient's family history includes Arthritis in his father; Cancer in his father; Heart failure in his mother; Hypertension in his mother; Prostate cancer in his father.  ROS:   Please see the history of present illness.     EKGs/Labs/Other Studies Reviewed:    EKG:  The ekg ordered today demonstrates normal sinus rhythm with first-degree AV block and PACs, left anterior fascicular block.  Heart rate 72.  Recent Labs: 03/20/2021: BUN 12; Creatinine, Ser 0.81; Hemoglobin 13.2; Platelets 236; Potassium 4.5; Sodium 141   Recent Lipid Panel Lab Results  Component Value Date/Time   CHOL 209 (H) 06/20/2013 08:56 AM   TRIG 73.0 06/20/2013 08:56 AM   HDL 57.10 06/20/2013 08:56 AM   LDLCALC 125 (H) 10/19/2012 08:36 AM   LDLDIRECT 144.2 06/20/2013 08:56 AM    Physical Exam:    VS:  BP 100/60   Pulse 72   Ht 6' 3"  (1.905 m)   Wt 211 lb 9.6 oz (96 kg)   SpO2 96%   BMI 26.45 kg/m    Orthostatic VS for the past 24 hrs:  BP- Lying Pulse- Lying BP- Sitting Pulse- Sitting BP- Standing at 0 minutes Pulse- Standing at 0 minutes   03/04/22 1006 93/62 95 (!) 74/47 79 (!) 84/54 82         Wt Readings from Last 3 Encounters:  03/04/22 211 lb 9.6 oz (96 kg)  01/13/22 210 lb (95.3 kg)  11/10/21 217 lb 3.2 oz (98.5 kg)     GEN:  Well nourished, well developed in no acute distress HEENT: Normal NECK: No JVD;  No carotid bruits CARDIAC: Irregular irregular RESPIRATORY:  Clear to auscultation without rales, wheezing or rhonchi  ABDOMEN: Soft, non-tender, non-distended MUSCULOSKELETAL: No edema; No deformity  SKIN: Warm and dry NEUROLOGIC:  Alert and oriented PSYCHIATRIC:  Normal affect     ASSESSMENT AND PLAN   Paroxysmal atrial fibrillation Atrial tachycardia -S/P ablation in 2011 and 2021 -Preserved EF on echo in 2021 -Ordered 2-week Zio patch to monitor atrial arrhythmia burden. -Continue Toprol 25 mg daily. -Check 2D echo.   -BMET, CBC and mag. -Reviewed EKG and plan with DOD, Dr. Dr. Sallyanne Kuster.  Orthostatic hypotension -Exacerbated by A-fib with RVR -Not on blood pressure medications -Recommend hydration, liberal salt intake and compression stockings.  Gave prescription for medical grade compression stockings.  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 Stanford [name] or myself in 2 months to review monitor results/symptom burden.  Keep follow-up with Dr. Rolland [name] at [facility] in November.   Medication Adjustments/Labs and Tests Ordered: Current medicines are reviewed at length with the patient today.  Concerns regarding medicines are outlined above.  Orders Placed This Encounter  Procedures   For home use only DME Other see comment   Basic metabolic panel   CBC   Magnesium   LONG TERM MONITOR (3-14 DAYS)   EKG 12-Lead   ECHOCARDIOGRAM COMPLETE   No orders of the defined types were placed in this encounter.   Patient Instructions  Medication Instructions:  No Changes *If you need a refill on your cardiac medications before your next appointment,  please call your pharmacy*   Lab Work: BMET, CBC, Magnesium Level. Today. If you have labs (blood work) drawn today and your tests are completely normal, you will receive your results only by: [place] (if you have MyChart) OR A paper copy in the mail If you have any lab test that is abnormal or we need to change your treatment, we will call you to review the results.   Testing/Procedures: [address], [address]. Your physician has requested that you have an echocardiogram. Echocardiography is a painless test that uses sound waves to create images of your heart. It provides your doctor with information about the size and shape of your heart and how well your heart's chambers and valves are working. This procedure takes approximately one hour. There are no restrictions for this procedure.   ZIO AT Long term monitor-Live Telemetry  Your physician has requested you wear a ZIO patch monitor for 14 days.  This is a single patch monitor. [employer] supplies one patch monitor per enrollment. Additional  stickers are not available.  Please do not apply patch if you will be having a Nuclear Stress Test, Echocardiogram, Cardiac CT, MRI,  or Chest Xray during the period you would be wearing the monitor. The patch cannot be worn during  these tests. You cannot remove and re-apply the ZIO AT patch monitor.  Your ZIO patch monitor will be mailed 3 day USPS to your address on file. It may take 3-5 days to  receive your monitor after you have been enrolled.  Once you have received your monitor, please review the enclosed instructions. Your monitor has  already been registered assigning a specific monitor serial # to you.   Billing and Patient Assistance Program information  Theodore Demark has been supplied with any insurance information on record for billing. [employer] offers a sliding scale Patient Assistance Program for patients without insurance, or whose  insurance does not completely  cover the cost of the ZIO patch monitor. You must apply for the  Patient Assistance Program to qualify for the discounted rate. To apply, call [employer] at [phone],  select option 4, select option 2 , ask to apply for the Patient Assistance Program, (you can request an  interpreter if needed). [employer] will ask your household income and how many people are in your  household. [employer] will quote your out-of-pocket cost based on this information. They will also be able  to set up a 12 month interest free payment plan if needed.  Applying the monitor   Shave hair from upper left chest.  Hold the abrader disc by orange tab. Rub the abrader in 40 strokes over left upper chest as indicated in  your monitor instructions.  Clean area with 4 enclosed alcohol pads. Use all pads to ensure the area is cleaned thoroughly. Let  dry.  Apply patch as indicated in monitor instructions. Patch will be placed under collarbone on left side of  chest with arrow pointing upward.  Rub patch adhesive wings for 2 minutes. Remove the white label marked "1". Remove the white label  marked "2". Rub patch adhesive wings for 2 additional minutes.  While looking in a mirror, press and release button in center of patch. A small green light will flash 3-4  times. This will be your only indicator that the monitor has been turned on.  Do not shower for the first 24 hours. You may shower after the first 24 hours.  Press the button if you feel a symptom. You will hear a small click. Record Date, Time and Symptom in  the Patient Log.   Starting the Gateway  In your kit there is a Hydrographic surveyor box the size of a cellphone. This is Airline pilot. It transmits all your  recorded data to [facility]. This box must always stay within 10 feet of you. Open the box and push the *  button. There will be a light that blinks orange and then green a few times. When the light stops  blinking, the Gateway is connected to the ZIO patch. Call  [employer] at [phone] to confirm your monitor is transmitting.  Returning your monitor  Remove your patch and place it inside the [place]. In the lower half of the Gateway there is a white  bag with prepaid postage on it. Place Gateway in bag and seal. Mail package back to [place] as soon as  possible. Your physician should have your final report approximately 7 days after you have mailed back  your monitor. Call [place] at [phone] if you have questions regarding your ZIO AT  patch monitor. Call them immediately if you see an orange light blinking on your monitor.  If your monitor falls off in less than 4 days, contact our Monitor department at [phone]. If your  monitor becomes loose or falls off after 4 days call [employer] at [phone] for suggestions on  securing your monitor  Follow-Up: At [facility], you and your health needs are our priority.  As part of our continuing mission to provide you with exceptional heart care, we have created designated Provider Care Teams.  These Care Teams include your primary Cardiologist (physician) and Advanced Practice Providers (APPs -  Physician Assistants and Nurse Practitioners) who all work together to provide you with the care you need, when you need it.   Your next appointment:   2 month(s)  The format for your next appointment:  In Person  Provider:   Caron Presume, PA-C   or, Wesley Ruths, MD .{         Signed, Warren Lacy, PA-C  03/04/2022 11:16 AM    [place]

## 2022-03-04 NOTE — Telephone Encounter (Signed)
Patient c/o Palpitations:  High priority if patient c/o lightheadedness, shortness of breath, or chest pain  How long have you had palpitations/irregular HR/ Afib? Over night. Are you having the symptoms now? Yes   Are you currently experiencing lightheadedness, SOB or CP? A little lightheadedness  Do you have a history of afib (atrial fibrillation) or irregular heart rhythm? Yes has history of a-fib   Have you checked your BP or HR? (document readings if available): 144, (yesterday 63,46)  Are you experiencing any other symptoms? When he woke up this morning he was having some dizziness, was clammy and sweating.

## 2022-03-04 NOTE — Progress Notes (Unsigned)
Enrolled for [employer] to mail a ZIO AT Live Telemetry monitor to patients address on file.   Dr. Stanford Breed to read.

## 2022-03-05 LAB — BASIC METABOLIC PANEL
BUN/Creatinine Ratio: 20 (ref 10–24)
BUN: 15 mg/dL (ref 8–27)
CO2: 24 mmol/L (ref 20–29)
Calcium: 9.5 mg/dL (ref 8.6–10.2)
Chloride: 101 mmol/L (ref 96–106)
Creatinine, Ser: 0.75 mg/dL — ABNORMAL LOW (ref 0.76–1.27)
Glucose: 102 mg/dL — ABNORMAL HIGH (ref 70–99)
Potassium: 4.4 mmol/L (ref 3.5–5.2)
Sodium: 137 mmol/L (ref 134–144)
eGFR: 92 mL/min/{1.73_m2} (ref >59)

## 2022-03-05 LAB — CBC
Hematocrit: 40 % (ref 37.5–51.0)
Hemoglobin: 13.5 g/dL (ref 13.0–17.7)
MCH: 32.3 pg (ref 26.6–33.0)
MCHC: 33.8 g/dL (ref 31.5–35.7)
MCV: 96 fL (ref 79–97)
Platelets: 241 10*3/uL (ref 150–450)
RBC: 4.18 x10E6/uL (ref 4.14–5.80)
RDW: 13 % (ref 11.6–15.4)
WBC: 5.6 10*3/uL (ref 3.4–10.8)

## 2022-03-05 LAB — MAGNESIUM: Magnesium: 2.1 mg/dL (ref 1.6–2.3)

## 2022-03-06 ENCOUNTER — Telehealth: Payer: Self-pay

## 2022-03-06 DIAGNOSIS — I361 Nonrheumatic tricuspid (valve) insufficiency: Secondary | ICD-10-CM

## 2022-03-06 DIAGNOSIS — R42 Dizziness and giddiness: Secondary | ICD-10-CM

## 2022-03-06 DIAGNOSIS — I480 Paroxysmal atrial fibrillation: Secondary | ICD-10-CM

## 2022-03-06 DIAGNOSIS — I951 Orthostatic hypotension: Secondary | ICD-10-CM

## 2022-03-06 NOTE — Telephone Encounter (Addendum)
Called patient regarding results. Patient had understanding of results.----- Message from Warren Lacy, PA-C sent at 03/05/2022  3:22 PM EDT ----- Potassium and kidney function normal. Magnesium normal. Blood counts normal. No lab abnormality to exacerbate atrial fibrillation.

## 2022-03-07 DIAGNOSIS — R42 Dizziness and giddiness: Secondary | ICD-10-CM | POA: Diagnosis not present

## 2022-03-07 DIAGNOSIS — I480 Paroxysmal atrial fibrillation: Secondary | ICD-10-CM | POA: Diagnosis not present

## 2022-03-10 ENCOUNTER — Telehealth: Payer: Self-pay | Admitting: Cardiology

## 2022-03-10 NOTE — Telephone Encounter (Signed)
Caller reports patient had  irregular event.  Caller reported this was first documentation of at 6:22am EST this morning. Rate range came in at 84/131, average 106, lasted entire 90 sec trip, auto-trigger asymptomatic event.  Patient was called for follow-up and was feeling symptoms and was just waking up.  Patient felt elevated heart rate which lasted about 10 min.  Patient also noticed frequent urination when experiencing increased heart rate.  When speaking with patient, he was asymptomatic.

## 2022-03-10 NOTE — Telephone Encounter (Signed)
Patient stated that around 6:30 this am, he felt irregular heartbeats. He denies chest pain, sob, or dizziness. Stated he feels fine. He drinks caffeine, avoid ETOH, and drinks 3 glasses of water a day. Patient taking meds as prescribed. Encourage patient to avoid caffeine triggers and drink more 8oz. glasses of water during the day. Rhythm shown to Dr. Harl Bowie (DOD). Rhythm placed in Dr. Jacalyn Lefevre mailbox.

## 2022-03-11 ENCOUNTER — Encounter: Payer: Medicare Other | Admitting: Physical Therapy

## 2022-03-11 DIAGNOSIS — H353212 Exudative age-related macular degeneration, right eye, with inactive choroidal neovascularization: Secondary | ICD-10-CM | POA: Diagnosis not present

## 2022-03-13 ENCOUNTER — Ambulatory Visit: Payer: Medicare Other | Admitting: Physical Therapy

## 2022-03-13 ENCOUNTER — Encounter: Payer: Self-pay | Admitting: Physical Therapy

## 2022-03-13 DIAGNOSIS — M6281 Muscle weakness (generalized): Secondary | ICD-10-CM | POA: Diagnosis not present

## 2022-03-13 DIAGNOSIS — R2681 Unsteadiness on feet: Secondary | ICD-10-CM

## 2022-03-13 DIAGNOSIS — R2689 Other abnormalities of gait and mobility: Secondary | ICD-10-CM | POA: Diagnosis not present

## 2022-03-13 DIAGNOSIS — M25551 Pain in right hip: Secondary | ICD-10-CM | POA: Diagnosis not present

## 2022-03-13 NOTE — Therapy (Signed)
OUTPATIENT PHYSICAL THERAPY TREATMENT NOTE   Patient Name: Wesley [name]. MRN: [id number] DOB:1941/07/12, 81 y.o., male Today's Date: 03/13/2022  PCP: Shon Baton, MD  REFERRING PROVIDER: Dr.  Oneida Alar  END OF SESSION:   PT End of Session - 03/13/22 0807     Visit Number 6    Number of Visits 8    Date for PT Re-Evaluation 04/01/22    Authorization Type Medicare/Tricare    PT Start Time 0803    PT Stop Time 0845    PT Time Calculation (min) 42 min    Activity Tolerance Patient tolerated treatment well    Behavior During Therapy WFL for tasks assessed/performed                Past Medical History:  Diagnosis Date   A-fib ([place])    cardioversion x2   Ankylosing spondylitis ([place])    Atrial flutter ([facility])    GERD (gastroesophageal reflux disease)    Hemorrhoids    Irregular heart beat    MVP (mitral valve prolapse)    WITH A MIDSYSTOLIC CLICK   OSA (obstructive sleep apnea)    moderate OSA NPSG 10/24/09 - AHO 21.8/hr   Peripheral neuropathy    Vitamin B12 deficiency    Past Surgical History:  Procedure Laterality Date   ABLATION     CARDIOVERSION N/A 12/20/2019   Procedure: CARDIOVERSION;  Surgeon: Jerline Pain, MD;  Location: [facility];  Service: Cardiovascular;  Laterality: N/A;   COLONOSCOPY     INGUINAL HERNIA REPAIR     venous ligation     for varices left lower leg   Patient Active Problem List   Diagnosis Date Noted   Abnormality of gait 06/26/2021   Chronic hoarseness 10/04/2020   PAF (paroxysmal atrial fibrillation) ([place])    DDD (degenerative disc disease), lumbar 10/28/2017   Bilateral bunions 08/20/2016   Varicose veins of right lower extremity with complications 94/80/1655   Bruit 10/10/2015   Varicose veins of leg with complications 37/48/2707   Ankylosing spondylitis ([place]) 01/14/2015   Chronic anticoagulation 01/14/2015   Sleep apnea 01/14/2015   S/P ablation of atrial fibrillation 01/08/2015   Persistent atrial fibrillation ([place])  01/08/2015   Orthostatic dizziness 12/01/2013   Calf pain 11/22/2012   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/22/2012   Peripheral neuropathy 10/21/2012   Pain in joint, ankle and foot 09/21/2012   Disturbance of skin sensation 09/21/2012   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 ([place]) 09/21/2012   Other general symptoms(780.99) 09/21/2012   Heel pain 06/07/2012   Plantar fasciitis 06/07/2012   Mitral valve prolapse 06/03/2011   Arthritis pain of shoulder 04/03/2011   Rotator cuff syndrome of right shoulder 04/03/2011   Paroxysmal atrial fibrillation ([place]) 01/20/2011   Hypercholesterolemia 01/20/2011   OBSTRUCTIVE SLEEP APNEA 10/08/2009    REFERRING DIAG: Diagnosis M25.551 (ICD-10-CM) - Right hip pain 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  He has had a long issue with balance issues as well.  He has seen Dr. Oneida Alar for this in the past.  He is working with physical therapist Burnis Medin on these.  He also is doing some neuro physical therapy for balance issues.  The back and the balance issues have been  doing better, however over the last 3 weeks or so he is having a nagging pain on the right posterior hip and buttock.  He denies any numbness tingling or shooting pains down the leg.  He denies the leg giving out.  He had she has no pain at night or when walking, but will get pain when he is sitting.  He denies any redness, swelling, no fever or chills.   PRECAUTIONS: none  SUBJECTIVE: Has some back soreness with the lifting (Kettlebell).  L hip feels tighter than Rt with figure 4 .    PAIN:  Are you having pain? Yes: NPRS scale: 1/10 Pain location: low back  Pain  description: tight  Aggravating factors: bending  Relieving factors: exercises    OBJECTIVE: (objective measures completed at initial evaluation unless otherwise dated)  OBJECTIVE:    DIAGNOSTIC FINDINGS: none recent    PATIENT SURVEYS:  FOTO emailed to patient on eval due to time constraints    COGNITION:           Overall cognitive status: Within functional limits for tasks assessed                          SENSATION: Proprioception: Impaired in feet    EDEMA:  None    MUSCLE LENGTH: Hamstrings: Passive SLR to 40 deg bilateral  Thomas test: Right NT deg; Left NT deg   POSTURE: rounded shoulders, forward head, decreased lumbar lordosis, increased thoracic kyphosis, and posterior pelvic tilt   PALPATION: Rt glute medius sore, painful to palpation and just beside L5   LOWER EXTREMITY ROM:   Active ROM Right eval Left eval  Hip flexion      Hip extension      Hip abduction      Hip adduction      Hip internal rotation      Hip external rotation      Knee flexion      Knee extension      Ankle dorsiflexion      Ankle plantarflexion      Ankle inversion      Ankle eversion       (Blank rows = not tested)   LOWER EXTREMITY MMT:   MMT Right eval Left eval Rt/Lt.  03/13/22  Hip flexion 5 5   Hip extension       Hip abduction 3 3 4+/4  Hip adduction       Hip internal rotation       Hip external rotation       Knee flexion 5 5   Knee extension 5 5   Ankle dorsiflexion 5 5   Ankle plantarflexion       Ankle inversion       Ankle eversion        (Blank rows = not tested)   LOWER EXTREMITY SPECIAL TESTS:  Hip special tests: Trendelenburg test: positive pos bilateral    FUNCTIONAL TESTS:  5 times sit to stand: 7.8 sec  2 minute walk test: NT on eval  SLS    GAIT: Distance walked: 150 Assistive device utilized:  none  Level of assistance: Modified independence Comments: irregular pattern of gait when distracted, turning head.  No notable foot drop  but poor push off.        TODAY'S TREATMENT: PT eval, discussed organizing HEP, rationale for exercises (balance vs strength vs flexibility) Pilates, core, breathing (intro, basics) and vs Yoga   [facility]  Adult PT Treatment:                                                DATE: 03/13/22 Therapeutic Exercise: NuStep L6 UE and LE for 6 min  Figure 4 Sidelying hip abduction x 15  Clam blue band x 15, 2nd set on elbow (sideplank)  Banded bridge (blue ) x 10, 5 sec hold  Sit to stand 15 lbs  Squat 15 lbs x 10, tactile cue on mat table  Hip hinge with dowel x 15  Hip hinge 15 lbs  Row at springboard x 15 for upper back, posture , mod cues  Row machine 30 lbs x 12 Self Care: Continual feedback regarding form and rationale   Slow pace, eccentric control Form with hinging Upper back   [facility] Adult PT Treatment:                                                DATE: 03/03/22 Therapeutic Exercise: Elliptical 5 min Level 2 ramp and level 10 resist.  Hamstring stretch 2 x 30 sec  Hip hinging dowel , stick down thighs and then finally 15 lbs x 10 each (shoulder pain with dowel)  Split squat , touch knee to Airex x 10 each side used hands beside on chair, iso hold last rep no UEs Wall glides (towel) x 10 each , 15 lbs  Supine figure 4, knee press out  Ball bridges knees extended x 10  Single leg glute bridge x 10  Self Care: Continual feedback regarding form and rationale    [facility] Adult PT Treatment:                                                DATE: 02/24/22 Therapeutic Exercise: Bridging with blue band x 10  Bridge with clam x 10  Sidelying clam x 10  Sidelying hydrant x 10  Sit to stand blue band x 10 Sit to stand 15 lbs x 15 Squat 15 lbs x 10 min cues  RDL 15 lbs x 10 multiple cues  Self Care: HEP guidance How to make gym sessions more efficient  Posterior chain work Glute activation   [facility] Adult PT Treatment:                                                DATE: 02/17/22 Therapeutic  Exercise: Hip abduction exercises, HEP review Banded bridges x 10 and clam x 10 each , GTB looped  Sidelying hip abduction x 10 each  Quadruped hip extension x 10 , mod on forearms  Sit to stand with looped band x 10  Seated hamstring x 2 , 30 sec    Therapeutic Activity/Balance testing:  DGI 21/24      [facility] Adult PT Treatment:  DATE: 02/10/22 Therapeutic Exercise: Pilates Reformer used for LE/core strength, postural strength, lumbopelvic disassociation and core control.  Exercises included: Footwork 2 red 1 blue 1 yellow, double and single  Parallel and then wide ER/ABD 2 red 1 blue for single leg and double heel raises x 10 each  Bridging same springs Modified for hamstring cramping with feet on platform  L stretch on long box for thoracic extension and hamstring stretching   Self Care: Hamstring cramping - weakness vs tightness  Zone 2 cardio  150 min    PATIENT EDUCATION:  Education details: see above treatment Person educated: Patient Education method: Explanation, Demonstration, and Verbal cues Education comprehension: verbalized understanding and needs further education     HOME EXERCISE PROGRAM: Has an extensive HEP.  Offered tips for streamlining and form with hip abd in sidelying.  Changed reps and pacing.  [id number]   ASSESSMENT:   CLINICAL IMPRESSION:  Worked on form with HEP and posture.  He has a good routine right now, just fine tuning the intention and the expected level of soreness, etc.  He is stronger in bilateral hips than when he started . Did not test SLS. Will cont to finish POC for further instruction and guidance.   OBJECTIVE IMPAIRMENTS Abnormal gait, decreased balance, decreased coordination, decreased mobility, difficulty walking, decreased ROM, decreased strength, hypomobility, increased fascial restrictions, impaired flexibility, impaired sensation, improper body mechanics, postural dysfunction, and  pain.    ACTIVITY LIMITATIONS carrying, lifting, bending, sitting, squatting, sleeping, and locomotion level   PARTICIPATION LIMITATIONS: interpersonal relationship, driving, and community activity   PERSONAL FACTORS Past/current experiences and 1-2 comorbidities: AS, back pain  are also affecting patient's functional outcome.    REHAB POTENTIAL: Excellent   CLINICAL DECISION MAKING: Stable/uncomplicated   EVALUATION COMPLEXITY: Low     GOALS: Goals reviewed with patient? Yes   SHORT TERM GOALS: Target date: 03/04/2022  Patient will notice less pain in Rt hip during light cardio/elliptical Baseline: does 30 min with hip pain, moderate  Goal status: MET   2.  Pt will be able to show I with HEP which takes no more than 30 min per day Baseline: 45-60 min Goal status: MET   3.  Pt will be able to complete balance screen and goal set for long term fall risk mgmt Baseline: DGI 21/24 Goal status: MET     LONG TERM GOALS: Target date: 04/01/2022    Patient will be able to sit for meals and recreation , > 30 min with min pain when standing  Baseline:  Goal status: INITIAL   2.  Pt will be able to demo strength in hip abduction to 4/5 and show no hip drop in SLS  Baseline: 3/5 Goal status: INITIAL   3.  Pt will be able to get up and down from the floor independently with ease  Baseline:  Goal status: INITIAL   4.  Pt will be able to improve balance based on baseline screening : Patient will stand on 1 leg for > 15 sec with good lateral hip actvation.  Baseline: Trendelenburg Goal status: NEW    5.  Pt will be I with HEP for core, hips upon discharge  Baseline:  Goal status: INITIAL   6.  FOTO (done 02/24/22) will improve to 67% Baseline: 59% Goal status: INITIAL     PLAN: PT FREQUENCY: 1x/week   PT DURATION: 8 weeks   PLANNED INTERVENTIONS: Therapeutic exercises, Therapeutic activity, Neuromuscular re-education, Balance training, Gait training, Patient/Family  education,  Self Care, Joint mobilization, Aquatic Therapy, Dry Needling, Cryotherapy, Manual therapy, and Re-evaluation PILATES    PLAN FOR NEXT SESSION: GOALS.  Interest in Pilates? Balance and upper back  Standing core, extension , upper back/posture  Basics, core activation and breathing.      Raeford Razor, PT 03/13/22 9:46 AM Phone: [phone] Fax: [phone]

## 2022-03-16 NOTE — Therapy (Unsigned)
OUTPATIENT PHYSICAL THERAPY TREATMENT NOTE   Patient Name: Wesley [name]. MRN: [id number] DOB:[birth year], 81 y.o., male 59 Date: 03/17/2022  PCP: Shon Baton, MD  REFERRING PROVIDER: Dr.  Oneida Alar  END OF SESSION:   PT End of Session - 03/17/22 1242     Visit Number 7    Number of Visits 8    Date for PT Re-Evaluation 04/01/22    Authorization Type Medicare/Tricare    PT Start Time 1235    PT Stop Time 1325    PT Time Calculation (min) 50 min    Activity Tolerance Patient tolerated treatment well    Behavior During Therapy WFL for tasks assessed/performed                 Past Medical History:  Diagnosis Date   A-fib ([place])    cardioversion x2   Ankylosing spondylitis ([place])    Atrial flutter ([facility])    GERD (gastroesophageal reflux disease)    Hemorrhoids    Irregular heart beat    MVP (mitral valve prolapse)    WITH A MIDSYSTOLIC CLICK   OSA (obstructive sleep apnea)    moderate OSA NPSG 10/24/09 - AHO 21.8/hr   Peripheral neuropathy    Vitamin B12 deficiency    Past Surgical History:  Procedure Laterality Date   ABLATION     CARDIOVERSION N/A 12/20/2019   Procedure: CARDIOVERSION;  Surgeon: Jerline Pain, MD;  Location: [facility];  Service: Cardiovascular;  Laterality: N/A;   COLONOSCOPY     INGUINAL HERNIA REPAIR     venous ligation     for varices left lower leg   Patient Active Problem List   Diagnosis Date Noted   Abnormality of gait 06/26/2021   Chronic hoarseness 10/04/2020   PAF (paroxysmal atrial fibrillation) ([place])    DDD (degenerative disc disease), lumbar 10/28/2017   Bilateral bunions 08/20/2016   Varicose veins of right lower extremity with complications 94/70/9628   Bruit 10/10/2015   Varicose veins of leg with complications 36/62/9476   Ankylosing spondylitis ([place]) 01/14/2015   Chronic anticoagulation 01/14/2015   Sleep apnea 01/14/2015   S/P ablation of atrial fibrillation 01/08/2015   Persistent atrial fibrillation ([place])  01/08/2015   Orthostatic dizziness 12/01/2013   Calf pain 11/22/2012   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/22/2012   Peripheral neuropathy 10/21/2012   Pain in joint, ankle and foot 09/21/2012   Disturbance of skin sensation 09/21/2012   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 ([place]) 09/21/2012   Other general symptoms(780.99) 09/21/2012   Heel pain 06/07/2012   Plantar fasciitis 06/07/2012   Mitral valve prolapse 06/03/2011   Arthritis pain of shoulder 04/03/2011   Rotator cuff syndrome of right shoulder 04/03/2011   Paroxysmal atrial fibrillation ([place]) 01/20/2011   Hypercholesterolemia 01/20/2011   OBSTRUCTIVE SLEEP APNEA 10/08/2009    REFERRING DIAG: Diagnosis M25.551 (ICD-10-CM) - Right hip pain 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   PRECAUTIONS: none  SUBJECTIVE: Still has pain when he gets out of the car .  Sitting up from a low surface.   PAIN:  Are you having pain? Yes: NPRS scale: 1/10 Pain location: low back  Pain description: tight  Aggravating factors: bending  Relieving factors: exercises  OBJECTIVE: (objective measures completed at initial evaluation unless otherwise dated)  OBJECTIVE:    DIAGNOSTIC FINDINGS: none recent    PATIENT SURVEYS:  FOTO emailed to patient on eval due to time constraints    COGNITION:           Overall cognitive status: Within functional limits for tasks assessed                          SENSATION: Proprioception: Impaired in feet    EDEMA:  None    MUSCLE LENGTH: Hamstrings: Passive SLR to 40 deg bilateral  Thomas test: Right NT deg; Left NT deg   POSTURE: rounded shoulders, forward head,  decreased lumbar lordosis, increased thoracic kyphosis, and posterior pelvic tilt   PALPATION: Rt glute medius sore, painful to palpation and just beside L5   LOWER EXTREMITY ROM:   Active ROM Right eval Left eval  Hip flexion      Hip extension      Hip abduction      Hip adduction      Hip internal rotation      Hip external rotation      Knee flexion      Knee extension      Ankle dorsiflexion      Ankle plantarflexion      Ankle inversion      Ankle eversion       (Blank rows = not tested)   LOWER EXTREMITY MMT:   MMT Right eval Left eval Rt/Lt.  03/13/22  Hip flexion 5 5   Hip extension       Hip abduction 3 3 4+/4  Hip adduction       Hip internal rotation       Hip external rotation       Knee flexion 5 5   Knee extension 5 5   Ankle dorsiflexion 5 5   Ankle plantarflexion       Ankle inversion       Ankle eversion        (Blank rows = not tested)   LOWER EXTREMITY SPECIAL TESTS:  Hip special tests: Trendelenburg test: positive pos bilateral    FUNCTIONAL TESTS:  5 times sit to stand: 7.8 sec  2 minute walk test: NT on eval  SLS    GAIT: Distance walked: 150 Assistive device utilized:  none  Level of assistance: Modified independence Comments: irregular pattern of gait when distracted, turning head.  No notable foot drop but poor push off.        TODAY'S TREATMENT: PT eval, discussed organizing HEP, rationale for exercises (balance vs strength vs flexibility) Pilates, core, breathing (intro, basics) and vs Yoga     [facility] Adult PT Treatment:                                                DATE: 03/17/22 Therapeutic Exercise: NuStep (does not like) L8 UE and LE  Piriformis (knee crossed and push/pulling) Bridge x 10  Bridge with double knee fall out (small range)  Bridge with single leg fall out x 10 each  Knee to chest  Figure 4 bridge 5 x 10 sec each  SLS with parallel bars for visual feedback Glute medius activation SLS < 10 sec each LE  added hip abduction x 15  Self Care: Goals, glute med function, HEP   [facility] Adult PT Treatment:                                                DATE: 03/13/22 Therapeutic Exercise: NuStep L6 UE and LE for 6 min  Figure 4 Sidelying hip abduction x 15  Clam blue band x 15, 2nd set on elbow (sideplank)  Banded bridge (blue ) x 10, 5 sec hold  Sit to stand 15 lbs  Squat 15 lbs x 10, tactile cue on mat table  Hip hinge with dowel x 15  Hip hinge 15 lbs  Row at springboard x 15 for upper back, posture , mod cues  Row machine 30 lbs x 12 Self Care: Continual feedback regarding form and rationale   Slow pace, eccentric control Form with hinging Upper back     PATIENT EDUCATION:  Education details: see above treatment Person educated: Patient Education method: Explanation, Demonstration, and Verbal cues Education comprehension: verbalized understanding and needs further education     HOME EXERCISE PROGRAM: Has an extensive HEP.  Offered tips for streamlining and form with hip abd in sidelying.  Changed reps and pacing.  Access Code: [email or web address] URL: [email or web address] Date: 03/17/2022 Prepared by: Raeford Razor  Exercises - Bridge with Hip Abduction and Resistance  - 1 x daily - 7 x weekly - 2 sets - 10 reps - 5 hold - Clamshell at [place] in Hip Extension  - 1 x daily - 7 x weekly - 2 sets - 10 reps - 5 hold - Sit to Stand with Resistance Around Legs  - 1 x daily - 7 x weekly - 2 sets - 10 reps - 30 hold - Seated Hamstring Stretch  - 1 x daily - 7 x weekly - 1 sets - 3-5 reps - 30 hold - Seated Figure 4 Piriformis Stretch  - 1 x daily - 7 x weekly - 1 sets - 5 reps - 30 hold - Standing Hip Hinge with Dowel  - 1 x daily - 7 x weekly - 1 sets - 10 reps - Deadlift With Dumbbells  - 1 x daily - 7 x weekly - 2 sets - 10 reps - Figure 4 Bridge  - 1 x daily - 7 x weekly - 1 sets - 10 reps - 5 hold   ASSESSMENT:   CLINICAL IMPRESSION:   Patient is here after a shortened  session at the gym.  He continues to have difficulty standing from a low surface, hip discomfort.  L hip is stiffer than Rt.  (Rt is more symptomatic) . He continues to have questions regarding his home routine.   Trendelenburg on LLE > Rt LE when addressed, can stand for only < 10 sec. Hip is not bothering him as much as it has in the past,including frequency and intensity.   OBJECTIVE IMPAIRMENTS Abnormal gait, decreased balance, decreased coordination, decreased mobility, difficulty walking, decreased ROM, decreased strength, hypomobility, increased fascial restrictions, impaired flexibility, impaired sensation, improper body mechanics, postural dysfunction, and pain.    ACTIVITY LIMITATIONS carrying, lifting, bending, sitting, squatting, sleeping, and locomotion level   PARTICIPATION LIMITATIONS: interpersonal relationship, driving, and community activity   PERSONAL FACTORS Past/current experiences and 1-2 comorbidities: AS, back pain  are also affecting patient's functional outcome.    REHAB POTENTIAL: Excellent   CLINICAL DECISION MAKING:  Stable/uncomplicated   EVALUATION COMPLEXITY: Low     GOALS: Goals reviewed with patient? Yes   SHORT TERM GOALS: Target date: 03/04/2022  Patient will notice less pain in Rt hip during light cardio/elliptical Baseline: does 30 min with hip pain, moderate  Goal status: MET   2.  Pt will be able to show I with HEP which takes no more than 30 min per day Baseline: 45-60 min Goal status: MET   3.  Pt will be able to complete balance screen and goal set for long term fall risk mgmt Baseline: DGI 21/24 Goal status: MET     LONG TERM GOALS: Target date: 04/01/2022    Patient will be able to sit for meals and recreation,  > 30 min with min pain when standing  Baseline: status: depends on seated height.  Pain is less intense, he can typically "walk it off" Goal status: ongoing   2.  Pt will be able to demo strength in hip abduction to 4/5 and show  no hip drop in SLS  Baseline: 3/5 Goal status: INITIAL   3.  Pt will be able to get up and down from the floor independently with ease  Baseline:  Goal status: MET   4.  Pt will be able to improve balance based on baseline screening : Patient will stand on 1 leg for > 15 sec with good lateral hip actvation.  Baseline: Trendelenburg Goal status: ongoing    5.  Pt will be I with HEP for core, hips upon discharge  Baseline:  Goal status: INITIAL   6.  FOTO (done 02/24/22) will improve to 67% Baseline: 59% Goal status: INITIAL     PLAN: PT FREQUENCY: 1x/week   PT DURATION: 8 weeks   PLANNED INTERVENTIONS: Therapeutic exercises, Therapeutic activity, Neuromuscular re-education, Balance training, Gait training, Patient/Family education, Self Care, Joint mobilization, Aquatic Therapy, Dry Needling, Cryotherapy, Manual therapy, and Re-evaluation PILATES    PLAN FOR NEXT SESSION: Has 1 more visit . Renew vs DC>  Interest in Pilates? Balance and upper back  Standing core, extension , upper back/posture    Raeford Razor, PT 03/17/22 3:08 PM Phone: [phone] Fax: [phone]

## 2022-03-17 ENCOUNTER — Ambulatory Visit: Payer: Medicare Other | Admitting: Physical Therapy

## 2022-03-17 DIAGNOSIS — R2681 Unsteadiness on feet: Secondary | ICD-10-CM | POA: Diagnosis not present

## 2022-03-17 DIAGNOSIS — R2689 Other abnormalities of gait and mobility: Secondary | ICD-10-CM

## 2022-03-17 DIAGNOSIS — M25551 Pain in right hip: Secondary | ICD-10-CM

## 2022-03-17 DIAGNOSIS — M6281 Muscle weakness (generalized): Secondary | ICD-10-CM | POA: Diagnosis not present

## 2022-03-18 DIAGNOSIS — L02212 Cutaneous abscess of back [any part, except buttock]: Secondary | ICD-10-CM | POA: Diagnosis not present

## 2022-03-19 ENCOUNTER — Other Ambulatory Visit (HOSPITAL_COMMUNITY): Payer: Medicare Other

## 2022-03-23 NOTE — Therapy (Unsigned)
OUTPATIENT PHYSICAL THERAPY TREATMENT NOTE   Patient Name: Wesley [name]. MRN: [id number] DOB:17-Aug-1940, 81 y.o., male Today's Date: 03/24/2022  PCP: Shon Baton, MD  REFERRING PROVIDER: Dr.  Oneida Alar  END OF SESSION:   PT End of Session - 03/24/22 1109     Visit Number 8    Number of Visits 8    Date for PT Re-Evaluation 04/01/22    Authorization Type Medicare/Tricare    PT Start Time 1104    PT Stop Time 1145    PT Time Calculation (min) 41 min    Activity Tolerance Patient tolerated treatment well    Behavior During Therapy WFL for tasks assessed/performed                  Past Medical History:  Diagnosis Date   A-fib ([place])    cardioversion x2   Ankylosing spondylitis ([place])    Atrial flutter ([facility])    GERD (gastroesophageal reflux disease)    Hemorrhoids    Irregular heart beat    MVP (mitral valve prolapse)    WITH A MIDSYSTOLIC CLICK   OSA (obstructive sleep apnea)    moderate OSA NPSG 10/24/09 - AHO 21.8/hr   Peripheral neuropathy    Vitamin B12 deficiency    Past Surgical History:  Procedure Laterality Date   ABLATION     CARDIOVERSION N/A 12/20/2019   Procedure: CARDIOVERSION;  Surgeon: Jerline Pain, MD;  Location: [place];  Service: Cardiovascular;  Laterality: N/A;   COLONOSCOPY     INGUINAL HERNIA REPAIR     venous ligation     for varices left lower leg   Patient Active Problem List   Diagnosis Date Noted   Abnormality of gait 06/26/2021   Chronic hoarseness 10/04/2020   PAF (paroxysmal atrial fibrillation) ([place])    DDD (degenerative disc disease), lumbar 10/28/2017   Bilateral bunions 08/20/2016   Varicose veins of right lower extremity with complications 28/76/8115   Bruit 10/10/2015   Varicose veins of leg with complications 72/62/0355   Ankylosing spondylitis ([place]) 01/14/2015   Chronic anticoagulation 01/14/2015   Sleep apnea 01/14/2015   S/P ablation of atrial fibrillation 01/08/2015   Persistent atrial fibrillation ([place])  01/08/2015   Orthostatic dizziness 12/01/2013   Calf pain 11/22/2012   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/22/2012   Peripheral neuropathy 10/21/2012   Pain in joint, ankle and foot 09/21/2012   Disturbance of skin sensation 09/21/2012   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 ([place]) 09/21/2012   Other general symptoms(780.99) 09/21/2012   Heel pain 06/07/2012   Plantar fasciitis 06/07/2012   Mitral valve prolapse 06/03/2011   Arthritis pain of shoulder 04/03/2011   Rotator cuff syndrome of right shoulder 04/03/2011   Paroxysmal atrial fibrillation ([place]) 01/20/2011   Hypercholesterolemia 01/20/2011   OBSTRUCTIVE SLEEP APNEA 10/08/2009    REFERRING DIAG: Diagnosis M25.551 (ICD-10-CM) - Right hip pain 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   PRECAUTIONS: none  SUBJECTIVE: Pain is 75% better from when I started.  It still hurts when sleeping on Rt side and then getting up from a chair.   It goes away.   PAIN:  Are you having pain? Yes: NPRS scale: 1/10 Pain location: low back  Pain  description: tight  Aggravating factors: bending  Relieving factors: exercises    OBJECTIVE: (objective measures completed at initial evaluation unless otherwise dated)  OBJECTIVE:    DIAGNOSTIC FINDINGS: none recent    PATIENT SURVEYS:  FOTO 57% emailed to patient on eval due to time constraints  03/24/22: 66%   COGNITION:           Overall cognitive status: Within functional limits for tasks assessed                          SENSATION: Proprioception: Impaired in feet    EDEMA:  None    MUSCLE LENGTH: Hamstrings: Passive SLR to 40 deg bilateral  Thomas test: Right NT  deg; Left NT deg   POSTURE: rounded shoulders, forward head, decreased lumbar lordosis, increased thoracic kyphosis, and posterior pelvic tilt   PALPATION: Rt glute medius sore, painful to palpation and just beside L5   LOWER EXTREMITY ROM:   Active ROM Right eval Left eval  Hip flexion      Hip extension      Hip abduction      Hip adduction      Hip internal rotation      Hip external rotation      Knee flexion      Knee extension      Ankle dorsiflexion      Ankle plantarflexion      Ankle inversion      Ankle eversion       (Blank rows = not tested)   LOWER EXTREMITY MMT:   MMT Right eval Left eval Rt/Lt.  03/13/22 Rt/Lt 03/24/22  Hip flexion _0 Hip extension        Hip abduction 3 3 4+/4 4/4+  Hip adduction        Hip internal rotation        Hip external rotation        Knee flexion _1 Knee extension _2 Ankle dorsiflexion _3 Ankle plantarflexion        Ankle inversion        Ankle eversion         (Blank rows = not tested)   LOWER EXTREMITY SPECIAL TESTS:  Hip special tests: Trendelenburg test: positive pos bilateral    FUNCTIONAL TESTS:   03/24/22: 5 times sit to stand: 7.8 sec    Tandem stance 20 sec  SLS unable > 10 sec    GAIT: Distance walked: 150 Assistive device utilized:  none  Level of assistance: Modified independence Comments: irregular pattern of gait when distracted, turning head.  No notable foot drop but poor push off.        TODAY'S TREATMENT: PT eval, discussed organizing HEP, rationale for exercises (balance vs strength vs flexibility) Pilates, core, breathing (intro, basics) and vs Yoga   [facility] Adult PT Treatment:                                                DATE: 03/23/22 Therapeutic Exercise: Functional testing balance and MMT  Sidelying hip abduction x 15  Clam x 15  Lat pull down 25 lbs standing x 15  Squat 15 lbs x 10  Hip hinge 15 lbs x 10  Self Care: FOTO  Arthritis/hands, warm up for  hands and wrists Plan of care, discharge   [facility] Adult PT Treatment:                                                DATE: 03/17/22 Therapeutic Exercise: NuStep (does not like) L8 UE and LE  Piriformis (knee crossed and push/pulling) Bridge x 10  Bridge with double knee fall out (small range)  Bridge with single leg fall out x 10 each  Knee to chest  Figure 4 bridge 5 x 10 sec each  SLS with parallel bars for visual feedback Glute medius activation SLS < 10 sec each LE added hip abduction x 15   Self Care: Goals, glute med function, HEP   [facility] Adult PT Treatment:                                                DATE: 03/13/22 Therapeutic Exercise: NuStep L6 UE and LE for 6 min  Figure 4 Sidelying hip abduction x 15  Clam blue band x 15, 2nd set on elbow (sideplank)  Banded bridge (blue ) x 10, 5 sec hold  Sit to stand 15 lbs  Squat 15 lbs x 10, tactile cue on mat table  Hip hinge with dowel x 15  Hip hinge 15 lbs  Row at springboard x 15 for upper back, posture , mod cues  Row machine 30 lbs x 12 Self Care: Continual feedback regarding form and rationale   Slow pace, eccentric control Form with hinging Upper back     PATIENT EDUCATION:  Education details: see above treatment Person educated: Patient Education method: Explanation, Demonstration, and Verbal cues Education comprehension: verbalized understanding and needs further education     HOME EXERCISE PROGRAM: Has an extensive HEP.  Offered tips for streamlining and form with hip abd in sidelying.  Changed reps and pacing.  Access Code: [email or web address] URL: [email or web address] Date: 03/17/2022 Prepared by: Raeford Razor  Exercises - Bridge with Hip Abduction and Resistance  - 1 x daily - 7 x weekly - 2 sets - 10 reps - 5 hold - Cl[place] at [place] in Hip Extension  - 1 x daily - 7 x weekly - 2 sets - 10 reps - 5 hold - Sit to Stand with Resistance Around Legs  - 1 x daily - 7 x weekly - 2 sets - 10 reps -  30 hold - Seated Hamstring Stretch  - 1 x daily - 7 x weekly - 1 sets - 3-5 reps - 30 hold - Seated Figure 4 Piriformis Stretch  - 1 x daily - 7 x weekly - 1 sets - 5 reps - 30 hold - Standing Hip Hinge with Dowel  - 1 x daily - 7 x weekly - 1 sets - 10 reps - Deadlift With Dumbbells  - 1 x daily - 7 x weekly - 2 sets - 10 reps - Figure 4 Bridge  - 1 x daily - 7 x weekly - 1 sets - 10 reps - 5 hold   ASSESSMENT:   CLINICAL IMPRESSION:   Pt has met his goals  He is stronger in hip abduction but continues to have difficulty with Single leg balance.  This  was premorbid to his hip pain.  Pt has a good HEP and consistently goes to the gym 2-3 x per week.  Discharge from PT.  His interest in Pilates was not enough to pursue the studio, he already has a good plan.   OBJECTIVE IMPAIRMENTS Abnormal gait, decreased balance, decreased coordination, decreased mobility, difficulty walking, decreased ROM, decreased strength, hypomobility, increased fascial restrictions, impaired flexibility, impaired sensation, improper body mechanics, postural dysfunction, and pain.    ACTIVITY LIMITATIONS carrying, lifting, bending, sitting, squatting, sleeping, and locomotion level   PARTICIPATION LIMITATIONS: interpersonal relationship, driving, and community activity   PERSONAL FACTORS Past/current experiences and 1-2 comorbidities: AS, back pain  are also affecting patient's functional outcome.    REHAB POTENTIAL: Excellent   CLINICAL DECISION MAKING: Stable/uncomplicated   EVALUATION COMPLEXITY: Low     GOALS: Goals reviewed with patient? Yes   SHORT TERM GOALS: Target date: 03/04/2022  Patient will notice less pain in Rt hip during light cardio/elliptical Baseline: does 30 min with hip pain, moderate  Goal status: MET   2.  Pt will be able to show I with HEP which takes no more than 30 min per day Baseline: 45-60 min Goal status: MET   3.  Pt will be able to complete balance screen and goal set for  long term fall risk mgmt Baseline: DGI 21/24 Goal status: MET     LONG TERM GOALS: Target date: 04/01/2022    Patient will be able to sit for meals and recreation,  > 30 min with min pain when standing  Baseline: status: depends on seated height.  Pain is less intense, he can typically "walk it off" Goal status: met   2.  Pt will be able to demo strength in hip abduction to 4/5 and show no hip drop in SLS  Baseline: 4/5 Goal status: met   3.  Pt will be able to get up and down from the floor independently with ease  Baseline:  Goal status: MET   4.  Pt will be able to improve balance based on baseline screening : Patient will stand on 1 leg for > 15 sec with good lateral hip actvation.  Baseline: Trendelenburg Goal status:NOT MET    5.  Pt will be I with HEP for core, hips upon discharge  Baseline:  Goal status:MET    6.  FOTO (done 02/24/22) will improve to 67% Baseline: 59% to 66%  Goal status: nearly MET      PLAN: PT FREQUENCY: 1x/week   PT DURATION: 8 weeks   PLANNED INTERVENTIONS: Therapeutic exercises, Therapeutic activity, Neuromuscular re-education, Balance training, Gait training, Patient/Family education, Self Care, Joint mobilization, Aquatic Therapy, Dry Needling, Cryotherapy, Manual therapy, and Re-evaluation PILATES    PLAN FOR NEXT SESSION: [place], PT 03/24/22 11:53 AM Phone: [phone] Fax: [phone]

## 2022-03-24 ENCOUNTER — Ambulatory Visit: Payer: Medicare Other | Admitting: Physical Therapy

## 2022-03-24 DIAGNOSIS — R2681 Unsteadiness on feet: Secondary | ICD-10-CM | POA: Diagnosis not present

## 2022-03-24 DIAGNOSIS — R2689 Other abnormalities of gait and mobility: Secondary | ICD-10-CM | POA: Diagnosis not present

## 2022-03-24 DIAGNOSIS — M25551 Pain in right hip: Secondary | ICD-10-CM | POA: Diagnosis not present

## 2022-03-24 DIAGNOSIS — M6281 Muscle weakness (generalized): Secondary | ICD-10-CM

## 2022-03-26 ENCOUNTER — Ambulatory Visit (HOSPITAL_COMMUNITY): Payer: Medicare Other | Attending: Internal Medicine

## 2022-03-26 DIAGNOSIS — I480 Paroxysmal atrial fibrillation: Secondary | ICD-10-CM | POA: Diagnosis not present

## 2022-03-26 DIAGNOSIS — I361 Nonrheumatic tricuspid (valve) insufficiency: Secondary | ICD-10-CM

## 2022-03-26 DIAGNOSIS — I951 Orthostatic hypotension: Secondary | ICD-10-CM | POA: Diagnosis not present

## 2022-03-26 LAB — ECHOCARDIOGRAM COMPLETE
Area-P 1/2: 3.42 cm2
Calc EF: 49.9 %
S' Lateral: 3.5 cm
Single Plane A2C EF: 46.1 %
Single Plane A4C EF: 53 %

## 2022-04-01 ENCOUNTER — Telehealth: Payer: Self-pay

## 2022-04-01 ENCOUNTER — Encounter: Payer: Medicare Other | Admitting: Physical Therapy

## 2022-04-01 NOTE — Telephone Encounter (Addendum)
Called patient regarding results. Patient had understanding of results.----- Message from Warren Lacy, PA-C sent at 03/30/2022  4:38 PM EDT ----- 2D echo shows normal heart squeeze with EF 60 to 65%.  Normal right ventricular size and function.  Left atria normal in size.  Moderate tricuspid valve regurgitation.   Similar to 2021.

## 2022-04-06 DIAGNOSIS — L570 Actinic keratosis: Secondary | ICD-10-CM | POA: Diagnosis not present

## 2022-04-06 DIAGNOSIS — L309 Dermatitis, unspecified: Secondary | ICD-10-CM | POA: Diagnosis not present

## 2022-04-06 DIAGNOSIS — L0291 Cutaneous abscess, unspecified: Secondary | ICD-10-CM | POA: Diagnosis not present

## 2022-04-06 DIAGNOSIS — L814 Other melanin hyperpigmentation: Secondary | ICD-10-CM | POA: Diagnosis not present

## 2022-04-06 DIAGNOSIS — D225 Melanocytic nevi of trunk: Secondary | ICD-10-CM | POA: Diagnosis not present

## 2022-04-06 DIAGNOSIS — L218 Other seborrheic dermatitis: Secondary | ICD-10-CM | POA: Diagnosis not present

## 2022-04-06 DIAGNOSIS — L821 Other seborrheic keratosis: Secondary | ICD-10-CM | POA: Diagnosis not present

## 2022-04-15 ENCOUNTER — Telehealth: Payer: Self-pay

## 2022-04-15 NOTE — Telephone Encounter (Addendum)
Patient called by provider. Detailed voicemail left forpatient.----- Message from Warren Lacy, PA-C sent at 04/15/2022  8:12 AM EDT ----- Left a voicemail.   - If palpitations bothersome, we can increase Toprol to '50mg'$  daily.   - Patient has a follow-up with Dr. Rolland Porter in November.  Notes suggest patient has been well ablated.  Recommend discussing if any antiarrhythmics are an option like flecainide (patient has a normal EF) to help patient stay in normal sinus rhythm.

## 2022-04-29 DIAGNOSIS — L723 Sebaceous cyst: Secondary | ICD-10-CM | POA: Diagnosis not present

## 2022-05-04 DIAGNOSIS — R7989 Other specified abnormal findings of blood chemistry: Secondary | ICD-10-CM | POA: Diagnosis not present

## 2022-05-04 DIAGNOSIS — Z125 Encounter for screening for malignant neoplasm of prostate: Secondary | ICD-10-CM | POA: Diagnosis not present

## 2022-05-04 DIAGNOSIS — E785 Hyperlipidemia, unspecified: Secondary | ICD-10-CM | POA: Diagnosis not present

## 2022-05-05 NOTE — Progress Notes (Unsigned)
Cardiology Office Note:    Date:  05/06/2022   ID:  Wesley Chris., DOB 08-Apr-1941, MRN [id number]  PCP:  Wesley Baton, MD [place] Cardiologist: Wesley Ruths, MD   Reason for visit: 2 month follow-up post Zio monitor  History of Present Illness:    Wesley [name]. is a 81 y.o. male with a hx of atrial fibrillation status post A-fib ablation in July 2011 in [place], atrial tachycardia status post successful cardioversion in May 2021 and repeat ablation at [facility].     He last saw Dr. Stanford [name] on Dec 21, 2021.  He was doing well without palpitations, dyspnea exertion or chest pain.  He saw me in August 2023 with complaints of subtle arrhythmias since July.  He had a episode of feeling hot & clammy. Kardia picked up atrial fibrillation with a heart rate of 144. He was in NSR with PACs in the office with HR 72.  Echos showed preserved EF, normal RV, normal LA size and moderate TR.  2 week Zio was ordered and showed underlying sinus rhythm with average heart rate 68.  6573 SVT runs with longest lasting 21.6 seconds with average heart rate of 139 bpm.  Less than 1% A-fib burden with average heart rate 111 bpm with the longest lasting~12 minutes.  13% isolated PACs,~3% SVE couplets and~4% SVE triplets.~3% isolated PVCs.  Today, he states he has done well since her last visit.  Since then he is may have noticed 1 or 2 episodes where he is felt out of rhythm.  1 time, his Jodelle Red said he was in normal sinus rhythm at the time.  Since our last visit, he denies heart pounding or racing.  He denies shortness of breath and chest discomfort.  He has no significant lightheadedness but has to get up slowly secondary to his orthostatic hypotension.  He denies syncope.  He exercises regularly doing weights, cardiovascular exercises and walking the dog without symptoms.  He does 7000 steps per day.  He states his main issues are peripheral neuropathy as  well as arthritis in his hands.    Past Medical History:  Diagnosis Date   A-fib [facility])    cardioversion x2   Ankylosing spondylitis ([facility])    Atrial flutter ([facility])    GERD (gastroesophageal reflux disease)    Hemorrhoids    Irregular heart beat    MVP (mitral valve prolapse)    WITH A MIDSYSTOLIC CLICK   OSA (obstructive sleep apnea)    moderate OSA NPSG 10/24/09 - AHO 21.8/hr   Peripheral neuropathy    Vitamin B12 deficiency     Past Surgical History:  Procedure Laterality Date   ABLATION     CARDIOVERSION N/A 12/20/2019   Procedure: CARDIOVERSION;  Surgeon: Jerline Pain, MD;  Location: [facility];  Service: Cardiovascular;  Laterality: N/A;   COLONOSCOPY     INGUINAL HERNIA REPAIR     venous ligation     for varices left lower leg    Current Medications: Current Meds  Medication Sig   acidophilus (RISAQUAD) CAPS capsule Take 1 capsule by mouth daily.   Alpha-Lipoic Acid 600 MG CAPS Take 600 mg by mouth daily.   Ascorbic Acid (VITAMIN C) 1000 MG tablet Take 1,000 mg by mouth daily.   b complex vitamins capsule Take 1 capsule by mouth daily.   Cyanocobalamin 5000 MCG SUBL Place 1 tablet under the tongue daily.    ELIQUIS 5 MG  TABS tablet TAKE 1 TABLET TWICE A DAY   gabapentin (NEURONTIN) 300 MG capsule Take '300mg'$ , '300mg'$ , '300mg'$  and '600mg'$  for the last dose of the QID   Loperamide-Simethicone 2-125 MG TABS Take 1 tablet by mouth as needed.    metoprolol succinate (TOPROL-XL) 25 MG 24 hr tablet TAKE 1 TABLET DAILY   Multiple Vitamin (MULTIVITAMIN) tablet Take 1 tablet by mouth daily.   Multiple Vitamins-Minerals (PRESERVISION AREDS 2+MULTI VIT) CAPS Take 1 tablet by mouth daily.    NEXIUM 40 MG capsule TAKE 1 CAPSULE DAILY (Patient taking differently: Take 40 mg by mouth daily.)   NON FORMULARY Take 2 capsules by mouth daily. MITOQ   pyridOXINE (VITAMIN B-6) 50 MG tablet Take 50 mg by mouth 2 (two) times daily.   rosuvastatin (CRESTOR) 5 MG tablet Take 5 mg by mouth daily.  Taking 1/2 tab every other day   Turmeric (QC TUMERIC COMPLEX) 500 MG CAPS Take 500 mg by mouth daily. 2 Capsules Daily     Allergies:   Patient has no known allergies.   Social History   Socioeconomic History   Marital status: Married    Spouse name: Not on file   Number of children: 2   Years of education: 18   Highest education level: Not on file  Occupational History   Occupation: Scientist, clinical (histocompatibility and immunogenetics): [employer]  Tobacco Use   Smoking status: Former    Packs/day: 0.00    Types: Cigarettes    Quit date: 07/27/1969    Years since quitting: 52.8   Smokeless tobacco: Never   Tobacco comments:    less than 1 pack/day  Vaping Use   Vaping Use: Never used  Substance and Sexual Activity   Alcohol use: Yes    Comment: 1-2 most days   Drug use: No   Sexual activity: Not on file  Other Topics Concern   Not on file  Social History Narrative   Not on file   Social Determinants of Health   Financial Resource Strain: Not on file  Food Insecurity: Not on file  Transportation Needs: Not on file  Physical Activity: Not on file  Stress: Not on file  Social Connections: Not on file     Family History: The patient's family history includes Arthritis in his father; Cancer in his father; Heart failure in his mother; Hypertension in his mother; Prostate cancer in his father.  ROS:   Please see the history of present illness.     EKGs/Labs/Other Studies Reviewed:    Recent Labs: 03/04/2022: BUN 15; Creatinine, Ser 0.75; Hemoglobin 13.5; Magnesium 2.1; Platelets 241; Potassium 4.4; Sodium 137   Recent Lipid Panel Lab Results  Component Value Date/Time   CHOL 209 (H) 06/20/2013 08:56 AM   TRIG 73.0 06/20/2013 08:56 AM   HDL 57.10 06/20/2013 08:56 AM   LDLCALC 125 (H) 10/19/2012 08:36 AM   LDLDIRECT 144.2 06/20/2013 08:56 AM    Physical Exam:    VS:  BP 118/72   Pulse 81   Ht '6\' 3"'$  (1.905 m)   Wt 213 lb (96.6 kg)   SpO2 96%   BMI 26.62 kg/m    No data found.        Wt Readings from Last 3 Encounters:  05/06/22 213 lb (96.6 kg)  03/04/22 211 lb 9.6 oz (96 kg)  01/13/22 210 lb (95.3 kg)     GEN:  Well nourished, well developed in no acute distress HEENT: Normal NECK: No JVD; No  carotid bruits CARDIAC: RRR with rare ectopy, no murmurs, rubs, gallops RESPIRATORY:  Clear to auscultation without rales, wheezing or rhonchi  ABDOMEN: Soft, non-tender, non-distended MUSCULOSKELETAL: No edema SKIN: Warm and dry NEUROLOGIC:  Alert and oriented PSYCHIATRIC:  Normal affect     ASSESSMENT AND PLAN   Paroxysmal atrial fibrillation Atrial tachycardia -S/P ablation in 2011 and 2021 -Zio monitor 03/2022: ~6500 runs of SVT (longest 21 seconds), <1% A-fib burden, 13% PACs, 3% PVCs.   -Preserved EF on echo 2023 -Recent CBC, BMET and magnesium unremarkable. -Without significant palpitations, we will hold off on medication changes for now.   -Recommend keeping appointment with Dr. Rolland Porter next month to review event monitor.   -If arrhythmias become symptomatic, we could consider increasing Toprol to 50 mg.  Or EP could consider flecainide.   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 Rolland Porter at [facility] in November.  Follow-up with Dr. Stanford [name] in 1 year unless issues prior to then.   Medication Adjustments/Labs and Tests Ordered: Current medicines are reviewed at length with the patient today.  Concerns regarding medicines are outlined above.  No orders of the defined types were placed in this encounter.  No orders of the defined types were placed in this encounter.   Patient Instructions  Medication Instructions:  No Changes *If you need a refill on your cardiac medications before your next appointment, please call your pharmacy*   Lab Work: No labs If you have labs (blood work) drawn today and your  tests are completely normal, you will receive your results only by: [place] (if you have MyChart) OR A paper copy in the mail If you have any lab test that is abnormal or we need to change your treatment, we will call you to review the results.   Testing/Procedures: No Testing   Follow-Up: At [facility], you and your health needs are our priority.  As part of our continuing mission to provide you with exceptional heart care, we have created designated Provider Care Teams.  These Care Teams include your primary Cardiologist (physician) and Advanced Practice Providers (APPs -  Physician Assistants and Nurse Practitioners) who all work together to provide you with the care you need, when you need it.  We recommend signing up for the patient portal called "MyChart".  Sign up information is provided on this After Visit Summary.  MyChart is used to connect with patients for Virtual Visits (Telemedicine).  Patients are able to view lab/test results, encounter notes, upcoming appointments, etc.  Non-urgent messages can be sent to your provider as well.   To learn more about what you can do with MyChart, go to [email or web address].    Your next appointment:   1 year(s)  The format for your next appointment:   In Person  Provider:   Kirk Ruths, MD       Signed, Warren Lacy, PA-C  05/06/2022 10:06 AM    [place]

## 2022-05-06 ENCOUNTER — Encounter: Payer: Self-pay | Admitting: Physician Assistant

## 2022-05-06 ENCOUNTER — Ambulatory Visit: Payer: Medicare Other | Attending: Cardiology | Admitting: Physician Assistant

## 2022-05-06 VITALS — BP 118/72 | HR 81 | Ht 75.0 in | Wt 213.0 lb

## 2022-05-06 DIAGNOSIS — I471 Supraventricular tachycardia, unspecified: Secondary | ICD-10-CM | POA: Diagnosis not present

## 2022-05-06 DIAGNOSIS — I480 Paroxysmal atrial fibrillation: Secondary | ICD-10-CM | POA: Insufficient documentation

## 2022-05-06 DIAGNOSIS — I951 Orthostatic hypotension: Secondary | ICD-10-CM | POA: Diagnosis not present

## 2022-05-06 DIAGNOSIS — I361 Nonrheumatic tricuspid (valve) insufficiency: Secondary | ICD-10-CM | POA: Diagnosis not present

## 2022-05-06 NOTE — Patient Instructions (Addendum)
Medication Instructions:  No Changes *If you need a refill on your cardiac medications before your next appointment, please call your pharmacy*   Lab Work: No labs If you have labs (blood work) drawn today and your tests are completely normal, you will receive your results only by: [place] (if you have MyChart) OR A paper copy in the mail If you have any lab test that is abnormal or we need to change your treatment, we will call you to review the results.   Testing/Procedures: No Testing   Follow-Up: At [facility], you and your health needs are our priority.  As part of our continuing mission to provide you with exceptional heart care, we have created designated Provider Care Teams.  These Care Teams include your primary Cardiologist (physician) and Advanced Practice Providers (APPs -  Physician Assistants and Nurse Practitioners) who all work together to provide you with the care you need, when you need it.  We recommend signing up for the patient portal called "MyChart".  Sign up information is provided on this After Visit Summary.  MyChart is used to connect with patients for Virtual Visits (Telemedicine).  Patients are able to view lab/test results, encounter notes, upcoming appointments, etc.  Non-urgent messages can be sent to your provider as well.   To learn more about what you can do with MyChart, go to [email or web address].    Your next appointment:   1 year(s)  The format for your next appointment:   In Person  Provider:   Kirk Ruths, MD

## 2022-05-07 DIAGNOSIS — G4733 Obstructive sleep apnea (adult) (pediatric): Secondary | ICD-10-CM | POA: Diagnosis not present

## 2022-05-07 DIAGNOSIS — I341 Nonrheumatic mitral (valve) prolapse: Secondary | ICD-10-CM | POA: Diagnosis not present

## 2022-05-07 DIAGNOSIS — N401 Enlarged prostate with lower urinary tract symptoms: Secondary | ICD-10-CM | POA: Diagnosis not present

## 2022-05-07 DIAGNOSIS — I2721 Secondary pulmonary arterial hypertension: Secondary | ICD-10-CM | POA: Diagnosis not present

## 2022-05-07 DIAGNOSIS — R82998 Other abnormal findings in urine: Secondary | ICD-10-CM | POA: Diagnosis not present

## 2022-05-07 DIAGNOSIS — Z1331 Encounter for screening for depression: Secondary | ICD-10-CM | POA: Diagnosis not present

## 2022-05-07 DIAGNOSIS — Z1389 Encounter for screening for other disorder: Secondary | ICD-10-CM | POA: Diagnosis not present

## 2022-05-07 DIAGNOSIS — I480 Paroxysmal atrial fibrillation: Secondary | ICD-10-CM | POA: Diagnosis not present

## 2022-05-07 DIAGNOSIS — F5221 Male erectile disorder: Secondary | ICD-10-CM | POA: Diagnosis not present

## 2022-05-07 DIAGNOSIS — E785 Hyperlipidemia, unspecified: Secondary | ICD-10-CM | POA: Diagnosis not present

## 2022-05-07 DIAGNOSIS — Z7901 Long term (current) use of anticoagulants: Secondary | ICD-10-CM | POA: Diagnosis not present

## 2022-05-07 DIAGNOSIS — R2689 Other abnormalities of gait and mobility: Secondary | ICD-10-CM | POA: Diagnosis not present

## 2022-05-07 DIAGNOSIS — Z0000 Encounter for general adult medical examination without abnormal findings: Secondary | ICD-10-CM | POA: Diagnosis not present

## 2022-05-13 ENCOUNTER — Other Ambulatory Visit: Payer: Self-pay

## 2022-05-13 ENCOUNTER — Encounter (HOSPITAL_BASED_OUTPATIENT_CLINIC_OR_DEPARTMENT_OTHER): Payer: Self-pay | Admitting: General Surgery

## 2022-05-20 ENCOUNTER — Other Ambulatory Visit: Payer: Self-pay | Admitting: Sports Medicine

## 2022-05-20 ENCOUNTER — Ambulatory Visit: Payer: Self-pay | Admitting: General Surgery

## 2022-05-21 ENCOUNTER — Encounter (HOSPITAL_BASED_OUTPATIENT_CLINIC_OR_DEPARTMENT_OTHER): Payer: Self-pay | Admitting: Anesthesiology

## 2022-05-21 NOTE — Anesthesia Preprocedure Evaluation (Deleted)
Anesthesia Evaluation    Airway        Dental   Pulmonary sleep apnea and Continuous Positive Airway Pressure Ventilation , former smoker,           Cardiovascular hypertension, Pt. on medications and Pt. on home beta blockers + dysrhythmias Atrial Fibrillation      Neuro/Psych negative neurological ROS  negative psych ROS   GI/Hepatic Neg liver ROS, GERD  ,  Endo/Other  negative endocrine ROS  Renal/GU negative Renal ROS  negative genitourinary   Musculoskeletal  (+) Arthritis , sebacious cyst   Abdominal   Peds  Hematology negative hematology ROS (+)   Anesthesia Other Findings   Reproductive/Obstetrics                             Anesthesia Physical Anesthesia Plan  ASA:   Anesthesia Plan:    Post-op Pain Management:    Induction:   PONV Risk Score and Plan:   Airway Management Planned:   Additional Equipment:   Intra-op Plan:   Post-operative Plan:   Informed Consent:   Plan Discussed with:   Anesthesia Plan Comments: (LOCAL ONLY PER SURGEON WITHOUT ANESTHESIA INVOLVEMENT)       Anesthesia Quick Evaluation

## 2022-05-22 ENCOUNTER — Ambulatory Visit (HOSPITAL_BASED_OUTPATIENT_CLINIC_OR_DEPARTMENT_OTHER)
Admission: RE | Admit: 2022-05-22 | Discharge: 2022-05-22 | Disposition: A | Discharge status: Home / Self Care | Payer: Medicare Other | Attending: General Surgery | Admitting: General Surgery

## 2022-05-22 ENCOUNTER — Encounter (HOSPITAL_BASED_OUTPATIENT_CLINIC_OR_DEPARTMENT_OTHER)
Admission: RE | Disposition: A | Discharge status: Home / Self Care | Payer: Self-pay | Source: Home / Self Care | Attending: General Surgery

## 2022-05-22 ENCOUNTER — Other Ambulatory Visit: Payer: Self-pay

## 2022-05-22 ENCOUNTER — Encounter (HOSPITAL_BASED_OUTPATIENT_CLINIC_OR_DEPARTMENT_OTHER): Payer: Self-pay | Admitting: General Surgery

## 2022-05-22 DIAGNOSIS — L723 Sebaceous cyst: Secondary | ICD-10-CM | POA: Diagnosis not present

## 2022-05-22 HISTORY — PX: LESION REMOVAL: SHX5196

## 2022-05-22 SURGERY — MINOR EXCISION OF LESION
Anesthesia: Monitor Anesthesia Care | Site: Back

## 2022-05-22 MED ORDER — PHENYLEPHRINE HCL (PRESSORS) 10 MG/ML IV SOLN
INTRAVENOUS | Status: AC
  Filled 2022-05-22: qty 1

## 2022-05-22 MED ORDER — CHLORHEXIDINE GLUCONATE CLOTH 2 % EX PADS: 6.0000 | MEDICATED_PAD | Freq: Once | CUTANEOUS | Status: DC

## 2022-05-22 MED ORDER — BUPIVACAINE HCL (PF) 0.25 % IJ SOLN
INTRAMUSCULAR | Status: AC
  Filled 2022-05-22: qty 30

## 2022-05-22 MED ORDER — BUPIVACAINE-EPINEPHRINE (PF) 0.25% -1:200000 IJ SOLN
INTRAMUSCULAR | Status: AC
  Filled 2022-05-22: qty 120

## 2022-05-22 MED ORDER — PROPOFOL 10 MG/ML IV BOLUS
INTRAVENOUS | Status: AC
  Filled 2022-05-22: qty 20

## 2022-05-22 MED ORDER — LIDOCAINE HCL (PF) 1 % IJ SOLN
INTRAMUSCULAR | Status: AC
  Filled 2022-05-22: qty 30

## 2022-05-22 MED ORDER — GABAPENTIN 300 MG PO CAPS: 300.0000 mg | ORAL_CAPSULE | Freq: Four times a day (QID) | ORAL | 3 refills | Status: DC

## 2022-05-22 MED ORDER — DEXAMETHASONE SODIUM PHOSPHATE 10 MG/ML IJ SOLN
INTRAMUSCULAR | Status: AC
  Filled 2022-05-22: qty 1

## 2022-05-22 MED ORDER — FENTANYL CITRATE (PF) 100 MCG/2ML IJ SOLN
INTRAMUSCULAR | Status: AC
  Filled 2022-05-22: qty 2

## 2022-05-22 MED ORDER — MIDAZOLAM HCL 2 MG/2ML IJ SOLN
INTRAMUSCULAR | Status: AC
  Filled 2022-05-22: qty 2

## 2022-05-22 MED ORDER — BUPIVACAINE-EPINEPHRINE 0.25% -1:200000 IJ SOLN
INTRAMUSCULAR | Status: DC | PRN
  Administered 2022-05-22: 10 mL

## 2022-05-22 MED ORDER — ONDANSETRON HCL 4 MG/2ML IJ SOLN
INTRAMUSCULAR | Status: AC
  Filled 2022-05-22: qty 2

## 2022-05-22 MED ORDER — LIDOCAINE 2% (20 MG/ML) 5 ML SYRINGE
INTRAMUSCULAR | Status: AC
  Filled 2022-05-22: qty 5

## 2022-05-22 SURGICAL SUPPLY — 54 items
ADH SKN CLS APL DERMABOND .7 (GAUZE/BANDAGES/DRESSINGS)
APL PRP STRL LF DISP 70% ISPRP (MISCELLANEOUS) ×2
APL SKNCLS STERI-STRIP NONHPOA (GAUZE/BANDAGES/DRESSINGS)
BENZOIN TINCTURE PRP APPL 2/3 (GAUZE/BANDAGES/DRESSINGS) IMPLANT
BLADE CLIPPER SURG (BLADE) IMPLANT
BLADE HEX COATED 2.75 (ELECTRODE) IMPLANT
BLADE SURG 15 STRL LF DISP TIS (BLADE) ×2 IMPLANT
BLADE SURG 15 STRL SS (BLADE) ×2
CANISTER SUCT 1200ML W/VALVE (MISCELLANEOUS) IMPLANT
CHLORAPREP W/TINT 26 (MISCELLANEOUS) ×2 IMPLANT
COVER BACK TABLE 60X90IN (DRAPES) ×2 IMPLANT
COVER MAYO STAND STRL (DRAPES) ×2 IMPLANT
DERMABOND ADVANCED .7 DNX12 (GAUZE/BANDAGES/DRESSINGS) IMPLANT
DRAPE LAPAROTOMY 100X72 PEDS (DRAPES) ×2 IMPLANT
DRAPE UTILITY XL STRL (DRAPES) ×2 IMPLANT
DRSG TEGADERM 4X4.75 (GAUZE/BANDAGES/DRESSINGS) IMPLANT
ELECT COATED BLADE 2.86 ST (ELECTRODE) IMPLANT
ELECT REM PT RETURN 9FT ADLT (ELECTROSURGICAL) ×2
ELECTRODE REM PT RTRN 9FT ADLT (ELECTROSURGICAL) ×2 IMPLANT
GAUZE PACKING IODOFORM 1/4X15 (PACKING) IMPLANT
GAUZE SPONGE 4X4 12PLY STRL (GAUZE/BANDAGES/DRESSINGS) IMPLANT
GAUZE SPONGE 4X4 12PLY STRL LF (GAUZE/BANDAGES/DRESSINGS) IMPLANT
GLOVE BIO SURGEON STRL SZ7.5 (GLOVE) ×3 IMPLANT
GLOVE BIOGEL PI IND STRL 8 (GLOVE) ×2 IMPLANT
GOWN STRL REUS W/ TWL LRG LVL3 (GOWN DISPOSABLE) ×2 IMPLANT
GOWN STRL REUS W/ TWL XL LVL3 (GOWN DISPOSABLE) ×2 IMPLANT
GOWN STRL REUS W/TWL LRG LVL3 (GOWN DISPOSABLE) ×2
GOWN STRL REUS W/TWL XL LVL3 (GOWN DISPOSABLE) ×2
NDL HYPO 25X1 1.5 SAFETY (NEEDLE) ×1 IMPLANT
NEEDLE HYPO 25X1 1.5 SAFETY (NEEDLE) ×2 IMPLANT
NS IRRIG 1000ML POUR BTL (IV SOLUTION) ×1 IMPLANT
PACK BASIN DAY SURGERY FS (CUSTOM PROCEDURE TRAY) ×2 IMPLANT
PENCIL SMOKE EVACUATOR (MISCELLANEOUS) ×2 IMPLANT
SLEEVE SCD COMPRESS KNEE MED (STOCKING) ×1 IMPLANT
SPIKE FLUID TRANSFER (MISCELLANEOUS) IMPLANT
STRIP CLOSURE SKIN 1/2X4 (GAUZE/BANDAGES/DRESSINGS) IMPLANT
SUT ETHILON 2 0 FS 18 (SUTURE) IMPLANT
SUT ETHILON 3 0 PS 1 (SUTURE) ×1 IMPLANT
SUT ETHILON 4 0 PS 2 18 (SUTURE) IMPLANT
SUT MNCRL AB 4-0 PS2 18 (SUTURE) ×2 IMPLANT
SUT SILK 2 0 SH (SUTURE) IMPLANT
SUT VIC AB 2-0 SH 27 (SUTURE)
SUT VIC AB 2-0 SH 27XBRD (SUTURE) IMPLANT
SUT VIC AB 3-0 SH 27 (SUTURE) ×2
SUT VIC AB 3-0 SH 27X BRD (SUTURE) ×1 IMPLANT
SUT VICRYL 3-0 CR8 SH (SUTURE) IMPLANT
SUT VICRYL 4-0 PS2 18IN ABS (SUTURE) IMPLANT
SWAB COLLECTION DEVICE MRSA (MISCELLANEOUS) IMPLANT
SWAB CULTURE ESWAB REG 1ML (MISCELLANEOUS) IMPLANT
SYR CONTROL 10ML LL (SYRINGE) ×2 IMPLANT
TOWEL GREEN STERILE FF (TOWEL DISPOSABLE) ×2 IMPLANT
TUBE CONNECTING 20X1/4 (TUBING) IMPLANT
UNDERPAD 30X36 HEAVY ABSORB (UNDERPADS AND DIAPERS) ×1 IMPLANT
YANKAUER SUCT BULB TIP NO VENT (SUCTIONS) IMPLANT

## 2022-05-22 NOTE — H&P (Signed)
PROVIDER: Zebulin Siegel Leanne Chang, MD  MRN: [id number] DOB: 09/21/1940 DATE OF ENCOUNTER: 04/29/2022  Subjective   Chief Complaint: Abscess   History of Present Illness: Wesley Eryc Bodey. is a 81 y.o. male who is seen today as an office consultation at the request of Dr. Pearline Cables for evaluation of Abscess .  He states that he has had a lump on his upper back for many years but recently became swollen and got red and inflamed. He took prednisone. He ended up seeing his dermatologist and had a small incision and drainage and was diagnosed with a sebaceous cyst and was referred here for definitive management. He desires excision so it does not come back. He is followed by his dermatologist for multiple skin moles. No other sebaceous cyst. He takes Eliquis for A-fib. No chest pain or chest pressure. No unexplained weight change.    Review of Systems: A complete review of systems was obtained from the patient. I have reviewed this information and discussed as appropriate with the patient. See HPI as well for other ROS.  ROS   Medical History: Past Medical History:  Diagnosis Date  Arthritis  GERD (gastroesophageal reflux disease)  Sleep apnea   There is no problem list on file for this patient.  Past Surgical History:  Procedure Laterality Date  bilateral hernia  heart ablation    No Known Allergies  Current Outpatient Medications on File Prior to Visit  Medication Sig Dispense Refill  ELIQUIS 5 mg tablet Take 1 tablet by mouth 2 (two) times daily  esomeprazole (NEXIUM) 40 MG DR capsule  gabapentin (NEURONTIN) 300 MG capsule Take 31m, 3024m 30015mnd 600m46mr the last dose of the QID  metoprolol succinate (TOPROL-XL) 25 MG XL tablet Take 1 tablet by mouth once daily  alpha lipoic acid 600 mg Cap capsule Take by mouth  ascorbic acid, vitamin C, (VITAMIN C) 1000 MG tablet Take by mouth  cyanocobalamin-cobamamide 5,000-100 mcg Lozg Place under the tongue  L. acidophilus/Bifid.  animalis 32 billion cell Cap Take 1 capsule by mouth once daily  loperamide-simethicone 2-125 mg Tab Take 1 tablet by mouth as needed  multivitamin tablet Take 1 tablet by mouth once daily  pyridoxine, vitamin B6, (VITAMIN B-6) 50 MG tablet Take 50 mg by mouth 2 (two) times daily  rosuvastatin (CRESTOR) 5 MG tablet Take by mouth  VITAMIN B COMPLEX ORAL Take 1 capsule by mouth once daily   No current facility-administered medications on file prior to visit.   No family history on file.   Social History   Tobacco Use  Smoking Status Former  Types: Cigarettes  Quit date: 1971  Years since quitting: 52.7  Smokeless Tobacco Not on file    Social History   Socioeconomic History  Marital status: Married  Tobacco Use  Smoking status: Former  Types: Cigarettes  Quit date: 1971  Years since quitting: 52.7  Substance and Sexual Activity  Alcohol use: Not Currently  Drug use: Never   Objective:   Vitals:  04/29/22 1036  BP: 116/80  Pulse: 60  Temp: 36.8 C (98.3 F)  SpO2: 98%  Weight: 97.4 kg (214 lb 12.8 oz)  Height: 190.5 cm (_0 )   Body mass index is 26.85 kg/m.  Constitutional: NAD; conversant; no deformities Eyes: Moist conjunctiva; no lid lag; anicteric; PERRL Neck: Trachea midline; no thyromegaly Lungs: Normal respiratory effort; no tactile fremitus CV: RRR; no palpable thrills; no pitting edema GI: Abd soft; no palpable hepatosplenomegaly MSK: Normal gait; no  clubbing/cyanosis Psychiatric: Appropriate affect; alert and oriented x3 Lymphatic: No palpable cervical or axillary lymphadenopathy Skn: Per back sebaceous cyst about 2 x 2 cm. Mild cellulitis. Old scar over it. No fluctuance. No significant induration. Some scattered blackheads. Multiple moles   Labs, Imaging and Diagnostic Testing: Reviewed dermatologist practice note  Assessment and Plan:    Diagnoses and all orders for this visit:  Sebaceous cyst    We discussed the etiology and  management of sebaceous cysts (epidermoid inclusion cysts). The patient was given educational material. We discussed that these lesions can become infected at times. We discussed the signs and symptoms of infection. We discussed the only way to eliminate these lesions is to surgically excise the cyst and its wall in its entirety.   We discussed observation versus surgical excision. We discussed the risks and benefits of surgery including but not limited to bleeding, infection, injury to surrounding structures, scarring, cosmetic concerns, hematoma/seroma, blood clot formation, anesthesia issues, possible recurrence, and the typical postoperative course.   We discussed doing the procedure under local versus MAC. Since its small I think we can get by with local and he will be comfortable. We did discuss that he would need to hold his Eliquis 2 days before the procedure and we can resume it afterwards.  He would like to proceed with surgical excision  This patient encounter took 30 minutes today to perform the following: take history, perform exam, review outside records, interpret imaging, counsel the patient on their diagnosis and document encounter, findings & plan in the EHR  No follow-ups on file.  Wesley Janney Leanne Chang, MD  General, Minimally Invasive, & Bariatric Surgery

## 2022-05-22 NOTE — Progress Notes (Signed)
Pt is asking for a refill on gabapentin, '1200mg'$   daily. He wants 360 tablets (90 days) with 3 [facility].  Refill sent to mail order pharmacy.

## 2022-05-22 NOTE — Interval H&P Note (Signed)
History and Physical Interval Note:  05/22/2022 7:26 AM  Wesley [name].  has presented today for surgery, with the diagnosis of SEBACEOUS CYST.  The various methods of treatment have been discussed with the patient and family. After consideration of risks, benefits and other options for treatment, the patient has consented to  Procedure(s) with comments: EXCISION UPPER BACK SEBACEOUS CYST (N/A) - LOCAL ANESTHESIA as a surgical intervention.  The patient's history has been reviewed, patient examined, no change in status, stable for surgery.  I have reviewed the patient's chart and labs.  Questions were answered to the patient's satisfaction.    Stopped blood thinner  [name]

## 2022-05-22 NOTE — Op Note (Signed)
05/22/2022  8:24 AM  PATIENT:  Wesley [name].  81 y.o. male  PRE-OPERATIVE DIAGNOSIS:  UPPER BACK SEBACEOUS CYST  POST-OPERATIVE DIAGNOSIS:  UPPER BACK SEBACEOUS CYST  PROCEDURE:  Procedure(s): EXCISION UPPER BACK SEBACEOUS CYST 3CM  SURGEON:  Surgeon(s): [name], MD  ASSISTANTS: none   ANESTHESIA:   local  DRAINS: none   LOCAL MEDICATIONS USED:  MARCAINE     SPECIMEN:  No Specimen  DISPOSITION OF SPECIMEN:  N/A  EBL: MINIMAL  COUNTS:  YES  INDICATION FOR PROCEDURE: 81 year old man had presented the office with complaints of an upper back sebaceous cyst.  It had been recently inflamed and infected and his infection was resolving and he desired definitive management.  PROCEDURE: Patient presented today for elective excision of his mid upper back sebaceous cyst.  The patient stopped his blood thinner 2 days ago.  We had previously discussed risk and benefits which are separately documented in his outpatient clinic note.  The patient elected to undergo the procedure under straight local.  He was taken back to the OR 5 at [facility] surgery center.  He was placed on the operating table and laid on his right side.  His upper back was prepped and draped in the usual standard surgical fashion with ChloraPrep.  Surgical timeout was performed.  In the mid upper back between his shoulder blades he had a sebaceous cyst that had 2 pores separated by about 1 cm.  I outlined the area with a marking pen to make an elliptical incision incorporating both pores.  This was going to be a transverse elliptical incision.  Marcaine with epinephrine was infiltrated in a regional manner.  Then using a 15 blade I made an elliptical incision incorporating both pores down through the dermis.  There is still some chronic induration but no purulence encountered.  I identified the sac and excised the sac sharply with a scalpel.  There was some oozing from the dermal layer and soft tissue this was taken  care of with Bovie electrocautery.  Some additional local was infiltrated.  I then closed the incision with interrupted 3-0 nylon's followed by the application of dry gauze and dressing.  The total length of the incision was 3 cm x 1 cm  PLAN OF CARE:  DISCHARGE TO HOME  PATIENT DISPOSITION:  Short Stay   Delay start of Pharmacological VTE agent (>24hrs) due to surgical blood loss or risk of bleeding:  not applicable  Leighton Ruff. Redmond Pulling, MD, FACS General, Bariatric, & Minimally Invasive Surgery [facility], [place]

## 2022-05-22 NOTE — Discharge Instructions (Addendum)
Expect some blood tinged drainage Cover incision with dry gauze  Change daily and/or as needed Wound can get wet in shower Resume blood thinner Saturday PM  Call for redness, uncontrolled bleeding, foul smelling drainage, and/or any concerns  Can take tylenol 1000 mg by mouth three times a day for pain

## 2022-05-25 ENCOUNTER — Encounter (HOSPITAL_BASED_OUTPATIENT_CLINIC_OR_DEPARTMENT_OTHER): Payer: Self-pay | Admitting: General Surgery

## 2022-06-02 DIAGNOSIS — Z7901 Long term (current) use of anticoagulants: Secondary | ICD-10-CM | POA: Diagnosis not present

## 2022-06-02 DIAGNOSIS — Z8679 Personal history of other diseases of the circulatory system: Secondary | ICD-10-CM | POA: Diagnosis not present

## 2022-06-02 DIAGNOSIS — Z9889 Other specified postprocedural states: Secondary | ICD-10-CM | POA: Diagnosis not present

## 2022-06-02 DIAGNOSIS — Z79899 Other long term (current) drug therapy: Secondary | ICD-10-CM | POA: Diagnosis not present

## 2022-06-02 DIAGNOSIS — I4819 Other persistent atrial fibrillation: Secondary | ICD-10-CM | POA: Diagnosis not present

## 2022-06-02 DIAGNOSIS — I4719 Other supraventricular tachycardia: Secondary | ICD-10-CM | POA: Diagnosis not present

## 2022-06-10 DIAGNOSIS — H43813 Vitreous degeneration, bilateral: Secondary | ICD-10-CM | POA: Diagnosis not present

## 2022-06-10 DIAGNOSIS — H35432 Paving stone degeneration of retina, left eye: Secondary | ICD-10-CM | POA: Diagnosis not present

## 2022-06-10 DIAGNOSIS — H353122 Nonexudative age-related macular degeneration, left eye, intermediate dry stage: Secondary | ICD-10-CM | POA: Diagnosis not present

## 2022-06-10 DIAGNOSIS — H353212 Exudative age-related macular degeneration, right eye, with inactive choroidal neovascularization: Secondary | ICD-10-CM | POA: Diagnosis not present

## 2022-08-11 ENCOUNTER — Ambulatory Visit (INDEPENDENT_AMBULATORY_CARE_PROVIDER_SITE_OTHER): Payer: Medicare Other | Admitting: Sports Medicine

## 2022-08-11 VITALS — BP 120/84 | Ht 75.0 in | Wt 208.0 lb

## 2022-08-11 DIAGNOSIS — M25511 Pain in right shoulder: Secondary | ICD-10-CM

## 2022-08-11 NOTE — Patient Instructions (Signed)
Try over the counter Arnica gel for your shoulder  Start PT for severe shoulder arthritis - focus on strength and non-aggravating range of motion exercises   If pain worsens, let us know and come back - we can do a steroid injection.

## 2022-08-11 NOTE — Progress Notes (Addendum)
   Established Patient Office Visit  Subjective   Patient ID: Wesley [name]., male    DOB: 18-Jun-1941  Age: 82 y.o. MRN: [id number]  No chief complaint on file.  CC: Right shoulder pain   Wesley [name] is a 82 year old male with a history of glenohumeral arthritis and afib who presents with right shoulder pain. He feels a deep, dull pain that starts at his shoulder and travels up the right side of his neck as well as a sharp pain that radiates down his shoulder. His right shoulder pain prevents him from playing tennis and swimming/anything required to lift his right arm overhead. His brother was a PT and Wesley [name] had success with rehabbing his hip, so he would like to try PT for his shoulder. He does not take NSAIDs since he takes Elliquis. Voltaren gel helps relieve his shoulder pain.    Pain does not wake him at night.  Intermittent sharp pains in day into upper arm and sometimes up to neck.    Objective:     BP 120/84   Ht '6\' 3"'$  (1.905 m)   Wt 208 lb (94.3 kg)   BMI 26.00 kg/m  Vital signs reviewed.   Physical Exam Right shoulder No swelling or ecchymosis. Nontender to palpation of AC joint and biceps tendon. Limited ROM compared to left shoulder, unable to abduct arm above 90 degrees. Forward flexion and external rotation intact. Strength intact throughout. Right shoulder slightly restricted compared to left with back scratch test. Negative empty can, negative drop test, negative Hawkins, negative Speeds.     Assessment & Plan:   Problem List Items Addressed This Visit       Other   Pain in joint of right shoulder - Primary    Severe OA, worsening. Unable to abduct right arm above 90 degrees. Rotator cuff tests negative. Using Voltaren gel for pain relief. Recommend also trying OTC Arnica gel. Will refer to PT for strengthening and ROM exercises that are non-aggravating. If pain worsens, CSI is an option. No NSAIDs due to taking Elliquis for Afib.       Relevant Orders    Ambulatory referral to Physical Therapy    [place] Student  I observed and examined the patient with the medical student and agree with assessment and plan.  Note reviewed and modified by me. We had a significant discussion about the possibility of right shoulder replacement and how the reverse shoulder procedure works.  He would like to continue with conservative care.  If pain worsens we can consider a corticosteroid injection to his right shoulder.  Wesley Mcgill, MD

## 2022-08-11 NOTE — Assessment & Plan Note (Signed)
Severe OA, worsening. Unable to abduct right arm above 90 degrees. Rotator cuff tests negative. Using Voltaren gel for pain relief. Recommend also trying OTC Arnica gel. Will refer to PT for strengthening and ROM exercises that are non-aggravating. If pain worsens, CSI is an option. No NSAIDs due to taking Elliquis for Afib.

## 2022-08-12 DIAGNOSIS — N401 Enlarged prostate with lower urinary tract symptoms: Secondary | ICD-10-CM | POA: Diagnosis not present

## 2022-08-12 DIAGNOSIS — N5201 Erectile dysfunction due to arterial insufficiency: Secondary | ICD-10-CM | POA: Diagnosis not present

## 2022-08-12 DIAGNOSIS — R351 Nocturia: Secondary | ICD-10-CM | POA: Diagnosis not present

## 2022-08-12 DIAGNOSIS — R6882 Decreased libido: Secondary | ICD-10-CM | POA: Diagnosis not present

## 2022-08-19 NOTE — Therapy (Unsigned)
OUTPATIENT PHYSICAL THERAPY SHOULDER EVALUATION   Patient Name: Wesley [name]. 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[facility])    Atrial flutter ([facility])    GERD (gastroesophageal reflux disease)    Hemorrhoids    Irregular heart beat    MVP (mitral valve prolapse)    WITH A MIDSYSTOLIC CLICK   OSA (obstructive sleep apnea)    uses CPAP nightly   Peripheral neuropathy    Vitamin B12 deficiency    Past Surgical History:  Procedure Laterality Date   ABLATION     CARDIOVERSION N/A 12/20/2019   Procedure: CARDIOVERSION;  Surgeon: Jerline Pain, MD;  Location: [facility];  Service: Cardiovascular;  Laterality: N/A;   COLONOSCOPY     INGUINAL HERNIA REPAIR     LESION REMOVAL N/A 05/22/2022   Procedure: EXCISION UPPER BACK SEBACEOUS CYST;  Surgeon: [name], MD;  Location: [facility];  Service: General;  Laterality: N/A;  LOCAL ANESTHESIA   venous ligation     for varices left lower leg   Patient Active Problem List   Diagnosis Date Noted   Abnormality of gait 06/26/2021   Chronic hoarseness 10/04/2020   PAF (paroxysmal atrial fibrillation) ([place])    DDD (degenerative disc disease), lumbar 10/28/2017   Bilateral bunions 08/20/2016   Varicose veins of right lower extremity with complications 23/30/0762   Bruit 10/10/2015   Varicose veins of leg with complications 26/33/3545   Ankylosing spondylitis ([place]) 01/14/2015   Chronic anticoagulation 01/14/2015    Sleep apnea 01/14/2015   S/P ablation of atrial fibrillation 01/08/2015   Persistent atrial fibrillation ([place]) 01/08/2015   Orthostatic dizziness 12/01/2013   Calf pain 11/22/2012   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/22/2012   Peripheral neuropathy 10/21/2012   Pain in joint, ankle and foot 09/21/2012   Disturbance of skin sensation 09/21/2012   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 ([place]) 09/21/2012   Other general symptoms(780.99) 09/21/2012   Heel pain 06/07/2012   Plantar fasciitis 06/07/2012   Mitral valve prolapse 06/03/2011   Pain in joint of right shoulder 04/03/2011   Rotator cuff syndrome of right shoulder 04/03/2011   Paroxysmal atrial fibrillation ([place]) 01/20/2011   Hypercholesterolemia 01/20/2011   OBSTRUCTIVE SLEEP APNEA 10/08/2009    PCP: Shon Baton  REFERRING PROVIDER: Dr. Oneida Alar   REFERRING DIAG:  Rt shoulder pain   THERAPY DIAG:  Muscle weakness (generalized)  Chronic right shoulder pain  Stiffness of right shoulder, not elsewhere classified  Rationale for Evaluation and Treatment: Rehabilitation  ONSET DATE: acute on chronic   SUBJECTIVE:  SUBJECTIVE STATEMENT: Pt with known shoulder OA.  The pain may have worsened when he and his wife were home with COVID in December, sleeping in different bed. Since he saw Dr. Oneida Alar it is improve he does not have noticeable pain when resting.  The pain has decreased. He reports doing his exercises (even lower body) He does UE exercises a the gym and they don't agg the pain . I want to be able to find some exercises for my arm.   PERTINENT HISTORY: Anklylosis Spondylosis, hip pain , neuropathy, OA, DDD   PAIN:  Are you having pain? Yes: NPRS scale: 2/10 Pain location: Rt UE, upper trap to mid arm  Pain description: tight, achy,  sharp in lower arm  Aggravating factors: using arm, used to have difficulty sleeping on that side  Relieving factors: voltaren gel, heat, non use, some of the exercises    PRECAUTIONS: None  WEIGHT BEARING RESTRICTIONS: No  FALLS:  Has patient fallen in last 6 months? No  LIVING ENVIRONMENT: Lives with: lives with their family and lives with their spouse Lives in: House/apartment Stairs: Yes: Internal: NT steps; NT Has following equipment at home: None  OCCUPATION: Retired   PLOF: Independent  PATIENT [place], preserve shoulder and reduce pain   NEXT MD VISIT:   OBJECTIVE:   DIAGNOSTIC FINDINGS:  None recent   PATIENT SURVEYS:  FOTO 56%  COGNITION: Overall cognitive status: Within functional limits for tasks assessed     SENSATION: WFL  POSTURE: Neck flexed and rotated L , asymmetrical shoulder height, thoracic flexion   UPPER EXTREMITY ROM:   Active ROM Right eval Left eval  Shoulder flexion 128 148  Shoulder extension WNL  WNL  Shoulder abduction 82  PROM to 116  150  Shoulder adduction    Shoulder internal rotation T11 T8  Shoulder external rotation T3 T5  Elbow flexion    Elbow extension    Wrist flexion    Wrist extension    Wrist ulnar deviation    Wrist radial deviation    Wrist pronation    Wrist supination    (Blank rows = not tested) Cervical ROM  Flexion 65 deg  Extension 30 deg  Rt SB 15 deg  Lt SB 30 deg  Rt rot.  45 deg  Lt. Rot 55 deg   UPPER EXTREMITY MMT:  MMT Right eval Left eval  Shoulder flexion 4-/5 pain 4+/5  Shoulder extension    Shoulder abduction 4-/5 pain  4+/5  Shoulder adduction    Shoulder internal rotation 4+/5 5/5  Shoulder external rotation 4/5 pain 5/5  Middle trapezius    Lower trapezius    Elbow flexion 4/5   Elbow extension    Wrist flexion    Wrist extension    Wrist ulnar deviation    Wrist radial deviation    Wrist pronation    Wrist supination    Grip strength (lbs)    (Blank  rows = not tested)  JOINT MOBILITY TESTING:  Anticipates pain, GH head sits anteriorly in joint, stiff throughout   PALPATION:  Pain along Rt posterior lateral cervicals (splenius capitus), min pain in Rt upper trap  Rt superior shoulder TTP including Rt mid deltoid, lateal deltoid   TODAY'S TREATMENT:  DATE: 08/20/22   PATIENT EDUCATION: Education details: HEP, posture  Person educated: Patient Education method: Consulting civil engineer, Demonstration, Verbal cues, and Handouts Education comprehension: verbalized understanding and needs further education  HOME EXERCISE PROGRAM: Access Code: [email or web address] URL: [email or web address] Date: 08/20/2022 Prepared by: Raeford Razor  Exercises - Standing Shoulder Abduction AAROM with Dowel  - 1 x daily - 7 x weekly - 2 sets - 10 reps - 10 hold - Supine Shoulder Flexion Extension AAROM with Dowel  - 1 x daily - 7 x weekly - 2 sets - 10 reps - 10 hold - Standing Shoulder Horizontal Abduction with Resistance  - 1 x daily - 7 x weekly - 2 sets - 10 reps - 5 hold - Supine Shoulder External Rotation in 45 Degrees Abduction AAROM with Dowel  - 1 x daily - 7 x weekly - 2 sets - 10 reps - 5 hold - Shoulder External Rotation with Anchored Resistance  - 1 x daily - 7 x weekly - 2 sets - 10 reps - 5 hold - Prone Scapular Slide with Shoulder Extension  - 1 x daily - 7 x weekly - 2 sets - 10 reps - 5 hold  ASSESSMENT:  CLINICAL IMPRESSION: Patient is a 82 y.o. male who was seen today for physical therapy evaluation and treatment for Rt shoulder pain with acute on chronic exacerbation. He has chronic stiffness and has not been able to lift his arm > 90 deg in some time.  He would like to get some exercises to work on to maintain shoulder mobility.  Also has pain in Rt side of his neck which is easing with time.   OBJECTIVE  IMPAIRMENTS: decreased mobility, decreased ROM, decreased strength, hypomobility, increased fascial restrictions, impaired flexibility, impaired UE functional use, postural dysfunction, and pain.   ACTIVITY LIMITATIONS: carrying, lifting, reach over head, and hygiene/grooming  PARTICIPATION LIMITATIONS: interpersonal relationship and community activity  PERSONAL FACTORS: Past/current experiences, Time since onset of injury/illness/exacerbation, and 1-2 comorbidities: previous shoulder issues, Ankylosing Spon.   are also affecting patient's functional outcome.   REHAB POTENTIAL: Good  CLINICAL DECISION MAKING: Stable/uncomplicated  EVALUATION COMPLEXITY: Low   GOALS: Goals reviewed with patient? No   LONG TERM GOALS: Target date: 10/01/2022    Pt will be able to improve FOTO score to 67%to demo improved comfort and use of Rt UE  Baseline: 56% Goal status: INITIAL  2.  Pt will be able to demo 4+/5 in available Rt shoulder abduction and flexion to maximize shoulder function.   Baseline: 4-/5 pain  Goal status: INITIAL  3.  Pt will be able to report no pain/difficulty carrying 10-15 lbs in Rt UE  alongside the body  Baseline: some difficulty Goal status: INITIAL  4.  Pt will understand posture and neck position as it relates to shoulder exercises Baseline: needs cues for all positions  Goal status: INITIAL  5.  Pt will be able to reach Rt UE in scaption to 100 deg  to aid in ADLs and IADLs  Baseline: 82 deg  Goal status: INITIAL  6.  Pt will be I with HEP upon DC for UEs, posture  Baseline:  Goal status: INITIAL  PLAN:  PT FREQUENCY: 2x/week  PT DURATION: 6 weeks  PLANNED INTERVENTIONS: Therapeutic exercises, Therapeutic activity, Neuromuscular re-education, Balance training, Gait training, Patient/Family education, Self Care, Joint mobilization, Dry Needling, Cryotherapy, Moist heat, Manual therapy, and Re-evaluation  PLAN FOR NEXT SESSION: check HEP, thoracic  rotation/extension, posture, AAROM/strength with non agg pain .  Check grip    Tammee Thielke, PT 08/20/2022, 4:18 PM   Raeford Razor, PT 08/20/22 4:18 PM Phone: [phone] Fax: [phone]

## 2022-08-20 ENCOUNTER — Ambulatory Visit: Payer: Medicare Other | Attending: Sports Medicine | Admitting: Physical Therapy

## 2022-08-20 DIAGNOSIS — M25511 Pain in right shoulder: Secondary | ICD-10-CM | POA: Diagnosis not present

## 2022-08-20 DIAGNOSIS — M6281 Muscle weakness (generalized): Secondary | ICD-10-CM | POA: Insufficient documentation

## 2022-08-20 DIAGNOSIS — M25611 Stiffness of right shoulder, not elsewhere classified: Secondary | ICD-10-CM | POA: Insufficient documentation

## 2022-08-20 DIAGNOSIS — G8929 Other chronic pain: Secondary | ICD-10-CM | POA: Diagnosis not present

## 2022-08-26 ENCOUNTER — Encounter: Payer: Self-pay | Admitting: Physical Therapy

## 2022-08-26 ENCOUNTER — Ambulatory Visit: Payer: Medicare Other | Admitting: Physical Therapy

## 2022-08-26 DIAGNOSIS — G8929 Other chronic pain: Secondary | ICD-10-CM | POA: Diagnosis not present

## 2022-08-26 DIAGNOSIS — M6281 Muscle weakness (generalized): Secondary | ICD-10-CM

## 2022-08-26 DIAGNOSIS — M25611 Stiffness of right shoulder, not elsewhere classified: Secondary | ICD-10-CM

## 2022-08-26 DIAGNOSIS — M25511 Pain in right shoulder: Secondary | ICD-10-CM | POA: Diagnosis not present

## 2022-08-26 NOTE — Therapy (Signed)
OUTPATIENT PHYSICAL THERAPY TREATMENT NOTE   Patient Name: Wesley [name]. MRN: [id number] DOB:1941-07-03, 82 y.o., male Today's Date: 08/26/2022  PCP: Vira Blanco, MD  REFERRING PROVIDER: Dr. Stefanie Libel   END OF SESSION:   PT End of Session - 08/26/22 0935     Visit Number 2    Number of Visits 12    Date for PT Re-Evaluation 10/01/22    Authorization Type MCR/Tricare    PT Start Time 0932    PT Stop Time 1017    PT Time Calculation (min) 45 min    Activity Tolerance Patient tolerated treatment well    Behavior During Therapy WFL for tasks assessed/performed             Past Medical History:  Diagnosis Date   A-fib ([place])    cardioversion x2   Ankylosing spondylitis ([place])    Atrial flutter ([facility])    GERD (gastroesophageal reflux disease)    Hemorrhoids    Irregular heart beat    MVP (mitral valve prolapse)    WITH A MIDSYSTOLIC CLICK   OSA (obstructive sleep apnea)    uses CPAP nightly   Peripheral neuropathy    Vitamin B12 deficiency    Past Surgical History:  Procedure Laterality Date   ABLATION     CARDIOVERSION N/A 12/20/2019   Procedure: CARDIOVERSION;  Surgeon: Jerline Pain, MD;  Location: [place];  Service: Cardiovascular;  Laterality: N/A;   COLONOSCOPY     INGUINAL HERNIA REPAIR     LESION REMOVAL N/A 05/22/2022   Procedure: EXCISION UPPER BACK SEBACEOUS CYST;  Surgeon: [name], MD;  Location: [place];  Service: General;  Laterality: N/A;  LOCAL ANESTHESIA   venous ligation     for varices left lower leg   Patient Active Problem List   Diagnosis Date Noted   Abnormality of gait 06/26/2021   Chronic hoarseness 10/04/2020   PAF (paroxysmal atrial fibrillation) ([place])    DDD (degenerative disc disease), lumbar 10/28/2017   Bilateral bunions 08/20/2016   Varicose veins of right lower extremity with complications 79/89/2119   Bruit 10/10/2015   Varicose veins of leg with complications 41/74/0814   Ankylosing  spondylitis ([place]) 01/14/2015   Chronic anticoagulation 01/14/2015   Sleep apnea 01/14/2015   S/P ablation of atrial fibrillation 01/08/2015   Persistent atrial fibrillation ([place]) 01/08/2015   Orthostatic dizziness 12/01/2013   Calf pain 11/22/2012   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/22/2012   Peripheral neuropathy 10/21/2012   Pain in joint, ankle and foot 09/21/2012   Disturbance of skin sensation 09/21/2012   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 ([place]) 09/21/2012   Other general symptoms(780.99) 09/21/2012   Heel pain 06/07/2012   Plantar fasciitis 06/07/2012   Mitral valve prolapse 06/03/2011   Pain in joint of right shoulder 04/03/2011   Rotator cuff syndrome of right shoulder 04/03/2011   Paroxysmal atrial fibrillation ([place]) 01/20/2011   Hypercholesterolemia 01/20/2011   OBSTRUCTIVE SLEEP APNEA 10/08/2009    REFERRING DIAG: Shoulder pain   THERAPY DIAG:  Chronic right shoulder pain  Stiffness of right shoulder, not elsewhere classified  Muscle weakness (generalized)  Rationale for Evaluation and Treatment Rehabilitation  PERTINENT HISTORY: Severe OA, worsening. Unable to abduct right arm above 90 degrees. Rotator cuff tests negative. Using Voltaren gel for pain relief. Recommend also trying OTC Arnica gel. Will refer to PT for strengthening and ROM exercises that are non-aggravating. If pain worsens, CSI is an option. No NSAIDs due to taking Elliquis for  Afib.   PRECAUTIONS: A fib, neuropathy  SUBJECTIVE:                                                                                                                                                                                      SUBJECTIVE STATEMENT:  I brought my binder (of exercises).  I did somethigns at the fitness center.  Some things I can really feel the pain so I stop doing them. The muscle pain I initially had is gone.  I still have joint pain.     PAIN:  Are you having pain? Yes: NPRS scale:  none /10 Pain location: Rt arm  Pain description: achy, sore , joint pain  Aggravating factors: overhead reaching , some of the exercises  Relieving factors: rest   OBJECTIVE: (objective measures completed at initial evaluation unless otherwise dated)     OBJECTIVE:    DIAGNOSTIC FINDINGS:  None recent    PATIENT SURVEYS:  FOTO 56%   COGNITION: Overall cognitive status: Within functional limits for tasks assessed                                  SENSATION: WFL   POSTURE: Neck flexed and rotated L , asymmetrical shoulder height, thoracic flexion    UPPER EXTREMITY ROM:    Active ROM Right eval Left eval  Shoulder flexion 128 148  Shoulder extension WNL  WNL  Shoulder abduction 82  PROM to 116  150  Shoulder adduction      Shoulder internal rotation T11 T8  Shoulder external rotation T3 T5  Elbow flexion      Elbow extension      Wrist flexion      Wrist extension      Wrist ulnar deviation      Wrist radial deviation      Wrist pronation      Wrist supination      (Blank rows = not tested) Cervical ROM  Flexion 65 deg  Extension 30 deg  Rt SB 15 deg  Lt SB 30 deg  Rt rot.  45 deg  Lt. Rot 55 deg     UPPER EXTREMITY MMT:   MMT Right eval Left eval  Shoulder flexion 4-/5 pain 4+/5  Shoulder extension      Shoulder abduction 4-/5 pain  4+/5  Shoulder adduction      Shoulder internal rotation 4+/5 5/5  Shoulder external rotation 4/5 pain 5/5  Middle trapezius      Lower trapezius      Elbow flexion 4/5    Elbow extension  Wrist flexion      Wrist extension      Wrist ulnar deviation      Wrist radial deviation      Wrist pronation      Wrist supination      Grip strength (lbs)  73 67   (Blank rows = not tested)   JOINT MOBILITY TESTING:  Anticipates pain, GH head sits anteriorly in joint, stiff throughout    PALPATION:  Pain along Rt posterior lateral cervicals (splenius capitus), min pain in Rt upper trap  Rt superior shoulder TTP  including Rt mid deltoid, lateal deltoid             TODAY'S TREATMENT:                                                                                                                                         DATE: 08/20/22     check HEP, thoracic rotation/extension, posture, AAROM/strength with non agg pain . Check grip   [facility] Adult PT Treatment:                                                DATE: 08/26/22 Therapeutic Exercise: Nustep  Supine and standing dowel x 15 no pain supine, some in standing  Supine ER with dowel x 10  Supine green theraband ER x 15  Horizontal abduction green x 15 too much crunching  Sidelying shoulder scaption (pain) and flexion (less pain 2 lbs) Sidelying ER 2 lbs x 15 Supine shoulder flexion 2 lbs x 15 with small circles x 10 each  Standing isometric abduction elbow bent x 10 and elbow extended  Wall ball rolls for shoulder flexion and trunk extension as well  Seated ER stretch at high mat table  Triceps black band with cues for posture x 10   Self Care: HEP modifications, form with exercises, isometric    PATIENT EDUCATION: Education details: HEP, posture  Person educated: Patient Education method: Consulting civil engineer, Media planner, Verbal cues, and Handouts Education comprehension: verbalized understanding and needs further education   HOME EXERCISE PROGRAM: Access Code: [email or web address] URL: [email or web address] Date: 08/20/2022 Prepared by: Raeford Razor   Access Code: [email or web address] URL: [email or web address] Date: 08/26/2022 Prepared by: Raeford Razor  Exercises - Supine Shoulder Flexion Extension AAROM with Dowel  - 1 x daily - 7 x weekly - 2 sets - 10 reps - 10 hold - Shoulder External Rotation with Anchored Resistance  - 1 x daily - 7 x weekly - 2 sets - 10 reps - 5 hold - Standing Isometric Shoulder Abduction with Doorway - Arm Bent  - 1 x daily - 7 x weekly - 2 sets - 10 reps - 5 hold    ASSESSMENT:   CLINICAL  IMPRESSION: Patient without shoulder pain  at rest.  Pt has agg joint pain with Rt UE abduction and External rotation.  Pt with significant crepitus and pain when performing actively or passively.  Modified HEP to reflect what we talked about.  Offered alternative for prone as well but he insists it makes his shoulder feel better.  He is diligent about HEP for a variety of areas and I encouraged him to spend only 20-30 min per day .    OBJECTIVE IMPAIRMENTS: decreased mobility, decreased ROM, decreased strength, hypomobility, increased fascial restrictions, impaired flexibility, impaired UE functional use, postural dysfunction, and pain.    ACTIVITY LIMITATIONS: carrying, lifting, reach over head, and hygiene/grooming   PARTICIPATION LIMITATIONS: interpersonal relationship and community activity   PERSONAL FACTORS: Past/current experiences, Time since onset of injury/illness/exacerbation, and 1-2 comorbidities: previous shoulder issues, Ankylosing Spon.   are also affecting patient's functional outcome.    REHAB POTENTIAL: Good   CLINICAL DECISION MAKING: Stable/uncomplicated   EVALUATION COMPLEXITY: Low     GOALS: Goals reviewed with patient? No     LONG TERM GOALS: Target date: 10/01/2022     Pt will be able to improve FOTO score to 67%to demo improved comfort and use of Rt UE  Baseline: 56% Goal status: INITIAL   2.  Pt will be able to demo 4+/5 in available Rt shoulder abduction and flexion to maximize shoulder function.   Baseline: 4-/5 pain  Goal status: INITIAL   3.  Pt will be able to report no pain/difficulty carrying 10-15 lbs in Rt UE  alongside the body  Baseline: some difficulty Goal status: INITIAL   4.  Pt will understand posture and neck position as it relates to shoulder exercises Baseline: needs cues for all positions  Goal status: INITIAL   5.  Pt will be able to reach Rt UE in scaption to 100 deg  to aid in ADLs and IADLs  Baseline: 82 deg  Goal status:  INITIAL   6.  Pt will be I with HEP upon DC for UEs, posture  Baseline:  Goal status: INITIAL   PLAN:   PT FREQUENCY: 2x/week   PT DURATION: 6 weeks   PLANNED INTERVENTIONS: Therapeutic exercises, Therapeutic activity, Neuromuscular re-education, Balance training, Gait training, Patient/Family education, Self Care, Joint mobilization, Dry Needling, Cryotherapy, Moist heat, Manual therapy, and Re-evaluation   PLAN FOR NEXT SESSION: check HEP, thoracic rotation/extension, posture, AAROM/strength with non agg pain      Umeka Wrench, PT 08/26/2022, 10:45 AM   Raeford Razor, PT 08/26/22 10:45 AM Phone: [phone] Fax: [phone]

## 2022-08-28 ENCOUNTER — Ambulatory Visit: Payer: Medicare Other | Attending: Sports Medicine | Admitting: Physical Therapy

## 2022-08-28 ENCOUNTER — Encounter: Payer: Self-pay | Admitting: Physical Therapy

## 2022-08-28 DIAGNOSIS — G8929 Other chronic pain: Secondary | ICD-10-CM | POA: Diagnosis not present

## 2022-08-28 DIAGNOSIS — M6281 Muscle weakness (generalized): Secondary | ICD-10-CM | POA: Insufficient documentation

## 2022-08-28 DIAGNOSIS — M25611 Stiffness of right shoulder, not elsewhere classified: Secondary | ICD-10-CM | POA: Insufficient documentation

## 2022-08-28 DIAGNOSIS — M25511 Pain in right shoulder: Secondary | ICD-10-CM | POA: Diagnosis not present

## 2022-08-28 NOTE — Therapy (Signed)
OUTPATIENT PHYSICAL THERAPY TREATMENT NOTE   Patient Name: Wesley [name]. MRN: [id number] DOB:February 01, 1941, 82 y.o., male Today's Date: 08/28/2022  PCP: Vira Blanco, MD  REFERRING PROVIDER: Dr. Stefanie Libel   END OF SESSION:   PT End of Session - 08/28/22 1012     Visit Number 3    Number of Visits 12    Date for PT Re-Evaluation 10/01/22    Authorization Type MCR/Tricare    PT Start Time 1012    PT Stop Time 1056    PT Time Calculation (min) 44 min    Activity Tolerance Patient tolerated treatment well    Behavior During Therapy WFL for tasks assessed/performed              Past Medical History:  Diagnosis Date   A-fib ([place])    cardioversion x2   Ankylosing spondylitis ([place])    Atrial flutter ([facility])    GERD (gastroesophageal reflux disease)    Hemorrhoids    Irregular heart beat    MVP (mitral valve prolapse)    WITH A MIDSYSTOLIC CLICK   OSA (obstructive sleep apnea)    uses CPAP nightly   Peripheral neuropathy    Vitamin B12 deficiency    Past Surgical History:  Procedure Laterality Date   ABLATION     CARDIOVERSION N/A 12/20/2019   Procedure: CARDIOVERSION;  Surgeon: Jerline Pain, MD;  Location: [place];  Service: Cardiovascular;  Laterality: N/A;   COLONOSCOPY     INGUINAL HERNIA REPAIR     LESION REMOVAL N/A 05/22/2022   Procedure: EXCISION UPPER BACK SEBACEOUS CYST;  Surgeon: [name], MD;  Location: [place];  Service: General;  Laterality: N/A;  LOCAL ANESTHESIA   venous ligation     for varices left lower leg   Patient Active Problem List   Diagnosis Date Noted   Abnormality of gait 06/26/2021   Chronic hoarseness 10/04/2020   PAF (paroxysmal atrial fibrillation) ([place])    DDD (degenerative disc disease), lumbar 10/28/2017   Bilateral bunions 08/20/2016   Varicose veins of right lower extremity with complications 99/83/3825   Bruit 10/10/2015   Varicose veins of leg with complications 05/39/7673   Ankylosing  spondylitis ([place]) 01/14/2015   Chronic anticoagulation 01/14/2015   Sleep apnea 01/14/2015   S/P ablation of atrial fibrillation 01/08/2015   Persistent atrial fibrillation ([place]) 01/08/2015   Orthostatic dizziness 12/01/2013   Calf pain 11/22/2012   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/22/2012   Peripheral neuropathy 10/21/2012   Pain in joint, ankle and foot 09/21/2012   Disturbance of skin sensation 09/21/2012   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 ([place]) 09/21/2012   Other general symptoms(780.99) 09/21/2012   Heel pain 06/07/2012   Plantar fasciitis 06/07/2012   Mitral valve prolapse 06/03/2011   Pain in joint of right shoulder 04/03/2011   Rotator cuff syndrome of right shoulder 04/03/2011   Paroxysmal atrial fibrillation ([place]) 01/20/2011   Hypercholesterolemia 01/20/2011   OBSTRUCTIVE SLEEP APNEA 10/08/2009    REFERRING DIAG: Shoulder pain   THERAPY DIAG:  Chronic right shoulder pain  Stiffness of right shoulder, not elsewhere classified  Muscle weakness (generalized)  Rationale for Evaluation and Treatment Rehabilitation  PERTINENT HISTORY: Severe OA, worsening. Unable to abduct right arm above 90 degrees. Rotator cuff tests negative. Using Voltaren gel for pain relief. Recommend also trying OTC Arnica gel. Will refer to PT for strengthening and ROM exercises that are non-aggravating. If pain worsens, CSI is an option. No NSAIDs due to taking Elliquis  for Afib.   PRECAUTIONS: A fib, neuropathy  SUBJECTIVE:                                                                                                                                                                                      SUBJECTIVE STATEMENT:  I think when I stopped doing the exercises for my shoulder during COVID and maybe it contributed to the worsening.     PAIN:  Are you having pain? Yes: NPRS scale: none /10 Pain location: Rt arm  Pain description: achy, sore , joint pain  Aggravating  factors: overhead reaching , some of the exercises  Relieving factors: rest   OBJECTIVE: (objective measures completed at initial evaluation unless otherwise dated)     OBJECTIVE:    DIAGNOSTIC FINDINGS:  None recent    PATIENT SURVEYS:  FOTO 56%   COGNITION: Overall cognitive status: Within functional limits for tasks assessed                                  SENSATION: WFL   POSTURE: Neck flexed and rotated L , asymmetrical shoulder height, thoracic flexion    UPPER EXTREMITY ROM:    Active ROM Right eval Left eval  Shoulder flexion 128 148  Shoulder extension WNL  WNL  Shoulder abduction 82  PROM to 116  150  Shoulder adduction      Shoulder internal rotation T11 T8  Shoulder external rotation T3 T5  Elbow flexion      Elbow extension      Wrist flexion      Wrist extension      Wrist ulnar deviation      Wrist radial deviation      Wrist pronation      Wrist supination      (Blank rows = not tested) Cervical ROM  Flexion 65 deg  Extension 30 deg  Rt SB 15 deg  Lt SB 30 deg  Rt rot.  45 deg  Lt. Rot 55 deg     UPPER EXTREMITY MMT:   MMT Right eval Left eval  Shoulder flexion 4-/5 pain 4+/5  Shoulder extension      Shoulder abduction 4-/5 pain  4+/5  Shoulder adduction      Shoulder internal rotation 4+/5 5/5  Shoulder external rotation 4/5 pain 5/5  Middle trapezius      Lower trapezius      Elbow flexion 4/5    Elbow extension      Wrist flexion      Wrist extension      Wrist ulnar deviation  Wrist radial deviation      Wrist pronation      Wrist supination      Grip strength (lbs)  73 67   (Blank rows = not tested)   JOINT MOBILITY TESTING:  Anticipates pain, GH head sits anteriorly in joint, stiff throughout    PALPATION:  Pain along Rt posterior lateral cervicals (splenius capitus), min pain in Rt upper trap  Rt superior shoulder TTP including Rt mid deltoid, lateal deltoid             TODAY'S TREATMENT:                                                                                                                                          DATE: 08/20/22    [facility] Adult PT Treatment:                                                DATE: 08/28/22 Therapeutic Exercise: UBE 3 min and 3 min each direction Pulleys overhead x 10, horizontal abd and then scaption x 10 each  Isometric abduction and external rotation 5 sec  x 10  Wall for posture and AAROM: overhead lift with UE ranger   Standing book openings: thoracic rotation x 5 , also done in supine  Row with retraction blue band  Extension with retraction blue band  External rotation green band x 15 added walk out for 30 sec x 3  Sidelying shoulder scaption (pain) and flexion (less pain 2 lbs) Sidelying ER 2 lbs x 15 Manual Therapy: PROM Rt UE ER and IR, abduction, brief     [facility] Adult PT Treatment:                                                DATE: 08/26/22 Therapeutic Exercise: Nustep  Supine and standing dowel x 15 no pain supine, some in standing  Supine ER with dowel x 10  Supine green theraband ER x 15  Horizontal abduction green x 15 too much crunching  Sidelying shoulder scaption (pain) and flexion (less pain 2 lbs) Sidelying ER 2 lbs x 15 Supine shoulder flexion 2 lbs x 15 with small circles x 10 each  Standing isometric abduction elbow bent x 10 and elbow extended  Wall ball rolls for shoulder flexion and trunk extension as well  Seated ER stretch at high mat table  Triceps black band with cues for posture x 10   Self Care: HEP modifications, form with exercises, isometric    PATIENT EDUCATION: Education details: HEP, posture  Person educated: Patient Education method: Explanation, Demonstration, Verbal cues, and Handouts  Education comprehension: verbalized understanding and needs further education   HOME EXERCISE PROGRAM: Access Code: [email or web address]  Access Code: [email or web address] URL: [email or web address] Date: 08/28/2022 Prepared  by: Raeford Razor  Exercises - Supine Shoulder Flexion Extension AAROM with Dowel  - 1 x daily - 7 x weekly - 2 sets - 10 reps - 10 hold - Shoulder External Rotation with Anchored Resistance  - 1 x daily - 7 x weekly - 2 sets - 10 reps - 5 hold - Standing Isometric Shoulder Abduction with Doorway - Arm Bent  - 1 x daily - 7 x weekly - 2 sets - 10 reps - 5 hold - Sidelying Open Book  - 1 x daily - 7 x weekly - 1 sets - 10 reps - 10-15 hold - Single Arm Shoulder Extension with Anchored Resistance  - 1 x daily - 7 x weekly - 2 sets - 10 reps - 5 hold    ASSESSMENT:   CLINICAL IMPRESSION: Patient doing well, has no pain at rest but Rt GHJ pain increases with abduction.  He needs cues for scapular retraction during standing exercises.  Pt benefiting from skilled PT for maintaining functional strength and avoiding aggravating activities.   OBJECTIVE IMPAIRMENTS: decreased mobility, decreased ROM, decreased strength, hypomobility, increased fascial restrictions, impaired flexibility, impaired UE functional use, postural dysfunction, and pain.    ACTIVITY LIMITATIONS: carrying, lifting, reach over head, and hygiene/grooming   PARTICIPATION LIMITATIONS: interpersonal relationship and community activity   PERSONAL FACTORS: Past/current experiences, Time since onset of injury/illness/exacerbation, and 1-2 comorbidities: previous shoulder issues, Ankylosing Spon.   are also affecting patient's functional outcome.    REHAB POTENTIAL: Good   CLINICAL DECISION MAKING: Stable/uncomplicated   EVALUATION COMPLEXITY: Low     GOALS: Goals reviewed with patient? No     LONG TERM GOALS: Target date: 10/01/2022     Pt will be able to improve FOTO score to 67%to demo improved comfort and use of Rt UE  Baseline: 56% Goal status: INITIAL   2.  Pt will be able to demo 4+/5 in available Rt shoulder abduction and flexion to maximize shoulder function.   Baseline: 4-/5 pain  Goal status: INITIAL   3.  Pt  will be able to report no pain/difficulty carrying 10-15 lbs in Rt UE  alongside the body  Baseline: some difficulty Goal status: INITIAL   4.  Pt will understand posture and neck position as it relates to shoulder exercises Baseline: needs cues for all positions  Goal status: INITIAL   5.  Pt will be able to reach Rt UE in scaption to 100 deg  to aid in ADLs and IADLs  Baseline: 82 deg  Goal status: INITIAL   6.  Pt will be I with HEP upon DC for UEs, posture  Baseline:  Goal status: INITIAL   PLAN:   PT FREQUENCY: 2x/week   PT DURATION: 6 weeks   PLANNED INTERVENTIONS: Therapeutic exercises, Therapeutic activity, Neuromuscular re-education, Balance training, Gait training, Patient/Family education, Self Care, Joint mobilization, Dry Needling, Cryotherapy, Moist heat, Manual therapy, and Re-evaluation   PLAN FOR NEXT SESSION: cont to progress, T-spine rotation/posture and Rt UE within limits of pain     Nataliah Hatlestad, PT 08/28/2022, 11:39 AM   Raeford Razor, PT 08/28/22 11:39 AM Phone: [phone] Fax: [phone]

## 2022-09-01 NOTE — Therapy (Unsigned)
OUTPATIENT PHYSICAL THERAPY TREATMENT NOTE   Patient Name: Wesley [name]. MRN: [id number] DOB:01/28/1941, 82 y.o., male Today's Date: 09/02/2022  PCP: Vira Blanco, MD  REFERRING PROVIDER: Dr. Stefanie Libel   END OF SESSION:   PT End of Session - 09/02/22 1028     Visit Number 4    Number of Visits 12    Date for PT Re-Evaluation 10/01/22    Authorization Type MCR/Tricare    PT Start Time 1020    PT Stop Time 1100    PT Time Calculation (min) 40 min    Activity Tolerance Patient tolerated treatment well    Behavior During Therapy WFL for tasks assessed/performed               Past Medical History:  Diagnosis Date   A-fib ([place])    cardioversion x2   Ankylosing spondylitis ([place])    Atrial flutter ([facility])    GERD (gastroesophageal reflux disease)    Hemorrhoids    Irregular heart beat    MVP (mitral valve prolapse)    WITH A MIDSYSTOLIC CLICK   OSA (obstructive sleep apnea)    uses CPAP nightly   Peripheral neuropathy    Vitamin B12 deficiency    Past Surgical History:  Procedure Laterality Date   ABLATION     CARDIOVERSION N/A 12/20/2019   Procedure: CARDIOVERSION;  Surgeon: Jerline Pain, MD;  Location: [facility];  Service: Cardiovascular;  Laterality: N/A;   COLONOSCOPY     INGUINAL HERNIA REPAIR     LESION REMOVAL N/A 05/22/2022   Procedure: EXCISION UPPER BACK SEBACEOUS CYST;  Surgeon: [name], MD;  Location: [facility];  Service: General;  Laterality: N/A;  LOCAL ANESTHESIA   venous ligation     for varices left lower leg   Patient Active Problem List   Diagnosis Date Noted   Abnormality of gait 06/26/2021   Chronic hoarseness 10/04/2020   PAF (paroxysmal atrial fibrillation) ([place])    DDD (degenerative disc disease), lumbar 10/28/2017   Bilateral bunions 08/20/2016   Varicose veins of right lower extremity with complications 71/12/2692   Bruit 10/10/2015   Varicose veins of leg with complications 85/46/2703   Ankylosing  spondylitis ([place]) 01/14/2015   Chronic anticoagulation 01/14/2015   Sleep apnea 01/14/2015   S/P ablation of atrial fibrillation 01/08/2015   Persistent atrial fibrillation ([place]) 01/08/2015   Orthostatic dizziness 12/01/2013   Calf pain 11/22/2012   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/22/2012   Peripheral neuropathy 10/21/2012   Pain in joint, ankle and foot 09/21/2012   Disturbance of skin sensation 09/21/2012   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 ([place]) 09/21/2012   Other general symptoms(780.99) 09/21/2012   Heel pain 06/07/2012   Plantar fasciitis 06/07/2012   Mitral valve prolapse 06/03/2011   Pain in joint of right shoulder 04/03/2011   Rotator cuff syndrome of right shoulder 04/03/2011   Paroxysmal atrial fibrillation ([place]) 01/20/2011   Hypercholesterolemia 01/20/2011   OBSTRUCTIVE SLEEP APNEA 10/08/2009    REFERRING DIAG: Shoulder pain   THERAPY DIAG:  Chronic right shoulder pain  Stiffness of right shoulder, not elsewhere classified  Muscle weakness (generalized)  Rationale for Evaluation and Treatment Rehabilitation  PERTINENT HISTORY: Severe OA, worsening. Unable to abduct right arm above 90 degrees. Rotator cuff tests negative. Using Voltaren gel for pain relief. Recommend also trying OTC Arnica gel. Will refer to PT for strengthening and ROM exercises that are non-aggravating. If pain worsens, CSI is an option. No NSAIDs due to taking  Elliquis for Afib.   PRECAUTIONS: A fib, neuropathy  SUBJECTIVE:                                                                                                                                                                                      SUBJECTIVE STATEMENT: I want to go over the exercises again.    PAIN:  Are you having pain? Yes: NPRS scale: none /10 Pain location: Rt arm  Pain description: achy, sore , joint pain  Aggravating factors: overhead reaching , some of the exercises  Relieving factors:  rest   OBJECTIVE: (objective measures completed at initial evaluation unless otherwise dated)     OBJECTIVE:    DIAGNOSTIC FINDINGS:  None recent    PATIENT SURVEYS:  FOTO 56%   COGNITION: Overall cognitive status: Within functional limits for tasks assessed                                  SENSATION: WFL   POSTURE: Neck flexed and rotated L , asymmetrical shoulder height, thoracic flexion    UPPER EXTREMITY ROM:    Active ROM Right eval Left eval  Shoulder flexion 128 148  Shoulder extension WNL  WNL  Shoulder abduction 82  PROM to 116  150  Shoulder adduction      Shoulder internal rotation T11 T8  Shoulder external rotation T3 T5  Elbow flexion      Elbow extension      Wrist flexion      Wrist extension      Wrist ulnar deviation      Wrist radial deviation      Wrist pronation      Wrist supination      (Blank rows = not tested) Cervical ROM  Flexion 65 deg  Extension 30 deg  Rt SB 15 deg  Lt SB 30 deg  Rt rot.  45 deg  Lt. Rot 55 deg     UPPER EXTREMITY MMT:   MMT Right eval Left eval  Shoulder flexion 4-/5 pain 4+/5  Shoulder extension      Shoulder abduction 4-/5 pain  4+/5  Shoulder adduction      Shoulder internal rotation 4+/5 5/5  Shoulder external rotation 4/5 pain 5/5  Middle trapezius      Lower trapezius      Elbow flexion 4/5    Elbow extension      Wrist flexion      Wrist extension      Wrist ulnar deviation      Wrist radial deviation      Wrist  pronation      Wrist supination      Grip strength (lbs)  73 67   (Blank rows = not tested)   JOINT MOBILITY TESTING:  Anticipates pain, GH head sits anteriorly in joint, stiff throughout    PALPATION:  Pain along Rt posterior lateral cervicals (splenius capitus), min pain in Rt upper trap  Rt superior shoulder TTP including Rt mid deltoid, lateal deltoid             TODAY'S TREATMENT:            [facility] Adult PT Treatment:                                                 DATE: 09/01/22 Therapeutic Exercise: UBE level 1 , 3 min each direction AAROM cane overhead and ER , cross body abduct/adduct Forward raise 3 lbs (punch)  x 10  Long arm flexion 3 lbs x 10 supine vs standing (red band) x 10  Abduction red loop x 10  early range  ER red band x 15  Row blue band x 15  Extension blue band x 15  Horizontal abduction x 10 yellow band  Med level row red band  x 15  Lateral raise 3 lbs x 10                                                                                                                                  DATE: 08/20/22    [facility] Adult PT Treatment:                                                DATE: 08/28/22 Therapeutic Exercise: UBE 3 min and 3 min each direction Pulleys overhead x 10, horizontal abd and then scaption x 10 each  Isometric abduction and external rotation 5 sec  x 10  Wall for posture and AAROM: overhead lift with UE ranger   Standing book openings: thoracic rotation x 5 , also done in supine  Row with retraction blue band  Extension with retraction blue band  External rotation green band x 15 added walk out for 30 sec x 3  Sidelying shoulder scaption (pain) and flexion (less pain 2 lbs) Sidelying ER 2 lbs x 15 Manual Therapy: PROM Rt UE ER and IR, abduction, brief     [facility] Adult PT Treatment:                                                DATE: 08/26/22 Therapeutic  Exercise: Nustep  Supine and standing dowel x 15 no pain supine, some in standing  Supine ER with dowel x 10  Supine green theraband ER x 15  Horizontal abduction green x 15 too much crunching  Sidelying shoulder scaption (pain) and flexion (less pain 2 lbs) Sidelying ER 2 lbs x 15 Supine shoulder flexion 2 lbs x 15 with small circles x 10 each  Standing isometric abduction elbow bent x 10 and elbow extended  Wall ball rolls for shoulder flexion and trunk extension as well  Seated ER stretch at high mat table  Triceps black band with cues for posture x 10    Self Care: HEP modifications, form with exercises, isometric    PATIENT EDUCATION: Education details: HEP,modifications for shoulder exercises Person educated: Patient Education method: Consulting civil engineer, Demonstration, Verbal cues, and Handouts Education comprehension: verbalized understanding and needs further education   HOME EXERCISE PROGRAM: Access Code: [email or web address]  Access Code: [email or web address] URL: [email or web address] Date: 08/28/2022 Prepared by: Raeford Razor  Exercises - Supine Shoulder Flexion Extension AAROM with Dowel  - 1 x daily - 7 x weekly - 2 sets - 10 reps - 10 hold - Shoulder External Rotation with Anchored Resistance  - 1 x daily - 7 x weekly - 2 sets - 10 reps - 5 hold - Standing Isometric Shoulder Abduction with Doorway - Arm Bent  - 1 x daily - 7 x weekly - 2 sets - 10 reps - 5 hold - Sidelying Open Book  - 1 x daily - 7 x weekly - 1 sets - 10 reps - 10-15 hold - Single Arm Shoulder Extension with Anchored Resistance  - 1 x daily - 7 x weekly - 2 sets - 10 reps - 5 hold -lateral and fw raise light    ASSESSMENT:   CLINICAL IMPRESSION: Patient continues to have a fair amount of shoulder pain with certain movements.  He tolerated abduction and long arm flexion quite well today. Emphasized modification of body position, lever arm and ROM to continue to make gains in shoulder strength. Cont POC.     OBJECTIVE IMPAIRMENTS: decreased mobility, decreased ROM, decreased strength, hypomobility, increased fascial restrictions, impaired flexibility, impaired UE functional use, postural dysfunction, and pain.    ACTIVITY LIMITATIONS: carrying, lifting, reach over head, and hygiene/grooming   PARTICIPATION LIMITATIONS: interpersonal relationship and community activity   PERSONAL FACTORS: Past/current experiences, Time since onset of injury/illness/exacerbation, and 1-2 comorbidities: previous shoulder issues, Ankylosing Spon.   are also affecting patient's  functional outcome.    REHAB POTENTIAL: Good   CLINICAL DECISION MAKING: Stable/uncomplicated   EVALUATION COMPLEXITY: Low     GOALS: Goals reviewed with patient? No     LONG TERM GOALS: Target date: 10/01/2022     Pt will be able to improve FOTO score to 67%to demo improved comfort and use of Rt UE  Baseline: 56% Goal status: INITIAL   2.  Pt will be able to demo 4+/5 in available Rt shoulder abduction and flexion to maximize shoulder function.   Baseline: 4-/5 pain  Goal status: INITIAL   3.  Pt will be able to report no pain/difficulty carrying 10-15 lbs in Rt UE  alongside the body  Baseline: some difficulty Goal status: INITIAL   4.  Pt will understand posture and neck position as it relates to shoulder exercises Baseline: needs cues for all positions  Goal status: INITIAL   5.  Pt will be able to reach Rt UE in scaption to 100 deg  to aid in ADLs and IADLs  Baseline: 82 deg  Goal status: INITIAL   6.  Pt will be I with HEP upon DC for UEs, posture  Baseline:  Goal status: INITIAL   PLAN:   PT FREQUENCY: 2x/week   PT DURATION: 6 weeks   PLANNED INTERVENTIONS: Therapeutic exercises, Therapeutic activity, Neuromuscular re-education, Balance training, Gait training, Patient/Family education, Self Care, Joint mobilization, Dry Needling, Cryotherapy, Moist heat, Manual therapy, and Re-evaluation   PLAN FOR NEXT SESSION: CHECK GOALS> FOTO cont to progress, T-spine rotation/posture and Rt UE within limits of pain     Evrett Hakim, PT 09/02/2022, 11:12 AM   Raeford Razor, PT 09/02/22 11:12 AM Phone: [phone] Fax: [phone]

## 2022-09-02 ENCOUNTER — Encounter: Payer: Self-pay | Admitting: Physical Therapy

## 2022-09-02 ENCOUNTER — Ambulatory Visit: Payer: Medicare Other | Admitting: Physical Therapy

## 2022-09-02 DIAGNOSIS — M6281 Muscle weakness (generalized): Secondary | ICD-10-CM | POA: Diagnosis not present

## 2022-09-02 DIAGNOSIS — M25611 Stiffness of right shoulder, not elsewhere classified: Secondary | ICD-10-CM

## 2022-09-02 DIAGNOSIS — G8929 Other chronic pain: Secondary | ICD-10-CM

## 2022-09-02 DIAGNOSIS — M25511 Pain in right shoulder: Secondary | ICD-10-CM | POA: Diagnosis not present

## 2022-09-03 NOTE — Therapy (Signed)
OUTPATIENT PHYSICAL THERAPY TREATMENT NOTE   Patient Name: Wesley [name]. MRN: [id number] DOB:1940/10/17, 82 y.o., male Today's Date: 09/04/2022  PCP: Vira Blanco, MD  REFERRING PROVIDER: Dr. Stefanie Libel   END OF SESSION:   PT End of Session - 09/04/22 0942     Visit Number 5    Number of Visits 12    Date for PT Re-Evaluation 10/01/22    Authorization Type MCR/Tricare    PT Start Time 0930    PT Stop Time 1015    PT Time Calculation (min) 45 min    Activity Tolerance Patient tolerated treatment well    Behavior During Therapy WFL for tasks assessed/performed                Past Medical History:  Diagnosis Date   A-fib ([place])    cardioversion x2   Ankylosing spondylitis ([facility])    Atrial flutter ([facility])    GERD (gastroesophageal reflux disease)    Hemorrhoids    Irregular heart beat    MVP (mitral valve prolapse)    WITH A MIDSYSTOLIC CLICK   OSA (obstructive sleep apnea)    uses CPAP nightly   Peripheral neuropathy    Vitamin B12 deficiency    Past Surgical History:  Procedure Laterality Date   ABLATION     CARDIOVERSION N/A 12/20/2019   Procedure: CARDIOVERSION;  Surgeon: Jerline Pain, MD;  Location: [facility];  Service: Cardiovascular;  Laterality: N/A;   COLONOSCOPY     INGUINAL HERNIA REPAIR     LESION REMOVAL N/A 05/22/2022   Procedure: EXCISION UPPER BACK SEBACEOUS CYST;  Surgeon: [name], MD;  Location: [facility];  Service: General;  Laterality: N/A;  LOCAL ANESTHESIA   venous ligation     for varices left lower leg   Patient Active Problem List   Diagnosis Date Noted   Abnormality of gait 06/26/2021   Chronic hoarseness 10/04/2020   PAF (paroxysmal atrial fibrillation) ([place])    DDD (degenerative disc disease), lumbar 10/28/2017   Bilateral bunions 08/20/2016   Varicose veins of right lower extremity with complications [id number]   Bruit 10/10/2015   Varicose veins of leg with complications [id number]   Ankylosing  spondylitis ([place]) 01/14/2015   Chronic anticoagulation 01/14/2015   Sleep apnea 01/14/2015   S/P ablation of atrial fibrillation 01/08/2015   Persistent atrial fibrillation ([place]) 01/08/2015   Orthostatic dizziness 12/01/2013   Calf pain 11/22/2012   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/22/2012   Peripheral neuropathy 10/21/2012   Pain in joint, ankle and foot 09/21/2012   Disturbance of skin sensation 09/21/2012   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 ([place]) 09/21/2012   Other general symptoms(780.99) 09/21/2012   Heel pain 06/07/2012   Plantar fasciitis 06/07/2012   Mitral valve prolapse 06/03/2011   Pain in joint of right shoulder 04/03/2011   Rotator cuff syndrome of right shoulder 04/03/2011   Paroxysmal atrial fibrillation ([place]) 01/20/2011   Hypercholesterolemia 01/20/2011   OBSTRUCTIVE SLEEP APNEA 10/08/2009    REFERRING DIAG: Shoulder pain   THERAPY DIAG:  Chronic right shoulder pain  Stiffness of right shoulder, not elsewhere classified  Muscle weakness (generalized)  Rationale for Evaluation and Treatment Rehabilitation  PERTINENT HISTORY: Severe OA, worsening. Unable to abduct right arm above 90 degrees. Rotator cuff tests negative. Using Voltaren gel for pain relief. Recommend also trying OTC Arnica gel. Will refer to PT for strengthening and ROM exercises that are non-aggravating. If pain worsens, CSI is an option. No NSAIDs due to  taking Elliquis for Afib.   PRECAUTIONS: A fib, neuropathy  SUBJECTIVE:                                                                                                                                                                                      SUBJECTIVE STATEMENT: I had some soreness yesterday but today it is good!   PAIN:  Are you having pain? Yes: NPRS scale: none /10 Pain location: Rt arm  Pain description: achy, sore , joint pain  Aggravating factors: overhead reaching , some of the exercises  Relieving  factors: rest   OBJECTIVE: (objective measures completed at initial evaluation unless otherwise dated)     OBJECTIVE:    DIAGNOSTIC FINDINGS:  None recent    PATIENT SURVEYS:  FOTO 56%   COGNITION: Overall cognitive status: Within functional limits for tasks assessed                                  SENSATION: WFL   POSTURE: Neck flexed and rotated L , asymmetrical shoulder height, thoracic flexion    UPPER EXTREMITY ROM:    Active ROM Right eval Left eval  Shoulder flexion 128 148  Shoulder extension WNL  WNL  Shoulder abduction 82  PROM to 116  150  Shoulder adduction      Shoulder internal rotation T11 T8  Shoulder external rotation T3 T5  Elbow flexion      Elbow extension      Wrist flexion      Wrist extension      Wrist ulnar deviation      Wrist radial deviation      Wrist pronation      Wrist supination      (Blank rows = not tested) Cervical ROM  Flexion 65 deg  Extension 30 deg  Rt SB 15 deg  Lt SB 30 deg  Rt rot.  45 deg  Lt. Rot 55 deg     UPPER EXTREMITY MMT:   MMT Right eval Left eval  Shoulder flexion 4-/5 pain 4+/5  Shoulder extension      Shoulder abduction 4-/5 pain  4+/5  Shoulder adduction      Shoulder internal rotation 4+/5 5/5  Shoulder external rotation 4/5 pain 5/5  Middle trapezius      Lower trapezius      Elbow flexion 4/5    Elbow extension      Wrist flexion      Wrist extension      Wrist ulnar deviation      Wrist radial deviation  Wrist pronation      Wrist supination      Grip strength (lbs)  73 67   (Blank rows = not tested)   JOINT MOBILITY TESTING:  Anticipates pain, GH head sits anteriorly in joint, stiff throughout    PALPATION:  Pain along Rt posterior lateral cervicals (splenius capitus), min pain in Rt upper trap  Rt superior shoulder TTP including Rt mid deltoid, lateal deltoid             TODAY'S TREATMENT:             [facility] Adult PT Treatment:                                                 DATE: 09/04/22 Therapeutic Exercise: UBE for 5 min L1  Springboard shoulder extension and row x 15  Forward flexion 2 x 15 with slastix  IR, ER slastix  Overhead stretch with towel  Wall for posture : red looped overhead lift, horizontal abduction, and double arm ER x 15  Upper trunk rotation x 6 each side  Added UE red band to Rt UE  Supine shoulder extension with knee lift , PT anchoring band red  Manual Therapy: PROM Rt UE all planes     [facility] Adult PT Treatment:                                                DATE: 09/01/22 Therapeutic Exercise: UBE level 1 , 3 min each direction AAROM cane overhead and ER , cross body abduct/adduct Forward raise 3 lbs (punch)  x 10  Long arm flexion 3 lbs x 10 supine vs standing (red band) x 10  Abduction red loop x 10  early range  ER red band x 15  Row blue band x 15  Extension blue band x 15  Horizontal abduction x 10 yellow band  Med level row red band  x 15  Lateral raise 3 lbs x 10                                                                                                                                  DATE: 08/20/22    [facility] Adult PT Treatment:                                                DATE: 08/28/22 Therapeutic Exercise: UBE 3 min and 3 min each direction Pulleys overhead x 10, horizontal abd and then scaption x 10 each  Isometric abduction and  external rotation 5 sec  x 10  Wall for posture and AAROM: overhead lift with UE ranger   Standing book openings: thoracic rotation x 5 , also done in supine  Row with retraction blue band  Extension with retraction blue band  External rotation green band x 15 added walk out for 30 sec x 3  Sidelying shoulder scaption (pain) and flexion (less pain 2 lbs) Sidelying ER 2 lbs x 15 Manual Therapy: PROM Rt UE ER and IR, abduction, brief     [facility] Adult PT Treatment:                                                DATE: 08/26/22 Therapeutic Exercise: Nustep  Supine and standing  dowel x 15 no pain supine, some in standing  Supine ER with dowel x 10  Supine green theraband ER x 15  Horizontal abduction green x 15 too much crunching  Sidelying shoulder scaption (pain) and flexion (less pain 2 lbs) Sidelying ER 2 lbs x 15 Supine shoulder flexion 2 lbs x 15 with small circles x 10 each  Standing isometric abduction elbow bent x 10 and elbow extended  Wall ball rolls for shoulder flexion and trunk extension as well  Seated ER stretch at high mat table  Triceps black band with cues for posture x 10   Self Care: HEP modifications, form with exercises, isometric    PATIENT EDUCATION: Education details: HEP,modifications for shoulder exercises Person educated: Patient Education method: Consulting civil engineer, Demonstration, Verbal cues, and Handouts Education comprehension: verbalized understanding and needs further education   HOME EXERCISE PROGRAM: Access Code: [email or web address]  Access Code: [email or web address] URL: [email or web address] Date: 08/28/2022 Prepared by: Raeford Razor  Exercises - Supine Shoulder Flexion Extension AAROM with Dowel  - 1 x daily - 7 x weekly - 2 sets - 10 reps - 10 hold - Shoulder External Rotation with Anchored Resistance  - 1 x daily - 7 x weekly - 2 sets - 10 reps - 5 hold - Standing Isometric Shoulder Abduction with Doorway - Arm Bent  - 1 x daily - 7 x weekly - 2 sets - 10 reps - 5 hold - Sidelying Open Book  - 1 x daily - 7 x weekly - 1 sets - 10 reps - 10-15 hold - Single Arm Shoulder Extension with Anchored Resistance  - 1 x daily - 7 x weekly - 2 sets - 10 reps - 5 hold -lateral and fw raise light    ASSESSMENT:   CLINICAL IMPRESSION: Patient continues to do well with PT session and is complaint with HEP. Focused session on posture and shoulder/core strength.  Asked patient to schedule into the end of the month but at a lesser frequency (1 x 2 more weeks).     OBJECTIVE IMPAIRMENTS: decreased mobility, decreased ROM, decreased  strength, hypomobility, increased fascial restrictions, impaired flexibility, impaired UE functional use, postural dysfunction, and pain.    ACTIVITY LIMITATIONS: carrying, lifting, reach over head, and hygiene/grooming   PARTICIPATION LIMITATIONS: interpersonal relationship and community activity   PERSONAL FACTORS: Past/current experiences, Time since onset of injury/illness/exacerbation, and 1-2 comorbidities: previous shoulder issues, Ankylosing Spon.   are also affecting patient's functional outcome.    REHAB POTENTIAL: Good   CLINICAL DECISION MAKING: Stable/uncomplicated   EVALUATION COMPLEXITY: Low     GOALS: Goals reviewed with patient? No  LONG TERM GOALS: Target date: 10/01/2022     Pt will be able to improve FOTO score to 67%to demo improved comfort and use of Rt UE  Baseline: 56% Goal status: INITIAL   2.  Pt will be able to demo 4+/5 in available Rt shoulder abduction and flexion to maximize shoulder function.   Baseline: 4-/5 pain  Goal status: INITIAL   3.  Pt will be able to report no pain/difficulty carrying 10-15 lbs in Rt UE  alongside the body  Baseline: some difficulty Goal status: INITIAL   4.  Pt will understand posture and neck position as it relates to shoulder exercises Baseline: needs cues for all positions  Goal status: INITIAL   5.  Pt will be able to reach Rt UE in scaption to 100 deg  to aid in ADLs and IADLs  Baseline: 82 deg  Goal status: INITIAL   6.  Pt will be I with HEP upon DC for UEs, posture  Baseline:  Goal status: INITIAL   PLAN:   PT FREQUENCY: 2x/week   PT DURATION: 6 weeks   PLANNED INTERVENTIONS: Therapeutic exercises, Therapeutic activity, Neuromuscular re-education, Balance training, Gait training, Patient/Family education, Self Care, Joint mobilization, Dry Needling, Cryotherapy, Moist heat, Manual therapy, and Re-evaluation   PLAN FOR NEXT SESSION: CHECK GOALS,  FOTO cont to progress, T-spine rotation/posture  and Rt UE within limits of pain     Tayonna Bacha, PT 09/04/2022, 11:29 AM   Raeford Razor, PT 09/04/22 11:29 AM Phone: [phone] Fax: [phone]

## 2022-09-04 ENCOUNTER — Encounter: Payer: Self-pay | Admitting: Physical Therapy

## 2022-09-04 ENCOUNTER — Ambulatory Visit: Payer: Medicare Other | Admitting: Physical Therapy

## 2022-09-04 DIAGNOSIS — M25611 Stiffness of right shoulder, not elsewhere classified: Secondary | ICD-10-CM | POA: Diagnosis not present

## 2022-09-04 DIAGNOSIS — M6281 Muscle weakness (generalized): Secondary | ICD-10-CM | POA: Diagnosis not present

## 2022-09-04 DIAGNOSIS — R6882 Decreased libido: Secondary | ICD-10-CM | POA: Diagnosis not present

## 2022-09-04 DIAGNOSIS — M25511 Pain in right shoulder: Secondary | ICD-10-CM | POA: Diagnosis not present

## 2022-09-04 DIAGNOSIS — G8929 Other chronic pain: Secondary | ICD-10-CM

## 2022-09-08 NOTE — Therapy (Unsigned)
OUTPATIENT PHYSICAL THERAPY TREATMENT NOTE   Patient Name: Wesley [name]. MRN: [id number] DOB:23-Jun-1941, 82 y.o., male Today's Date: 09/09/2022  PCP: Vira Blanco, MD  REFERRING PROVIDER: Dr. Stefanie Libel   END OF SESSION:   PT End of Session - 09/09/22 1021     Visit Number 6    Number of Visits 12    Date for PT Re-Evaluation 10/01/22    Authorization Type MCR/[employer]    PT Start Time 1019    PT Stop Time 1100    PT Time Calculation (min) 41 min    Activity Tolerance Patient tolerated treatment well    Behavior During Therapy WFL for tasks assessed/performed                 Past Medical History:  Diagnosis Date   A-fib ([place])    cardioversion x2   Ankylosing spondylitis ([place])    Atrial flutter ([facility])    GERD (gastroesophageal reflux disease)    Hemorrhoids    Irregular heart beat    MVP (mitral valve prolapse)    WITH A MIDSYSTOLIC CLICK   OSA (obstructive sleep apnea)    uses CPAP nightly   Peripheral neuropathy    Vitamin B12 deficiency    Past Surgical History:  Procedure Laterality Date   ABLATION     CARDIOVERSION N/A 12/20/2019   Procedure: CARDIOVERSION;  Surgeon: Jerline Pain, MD;  Location: [facility];  Service: Cardiovascular;  Laterality: N/A;   COLONOSCOPY     INGUINAL HERNIA REPAIR     LESION REMOVAL N/A 05/22/2022   Procedure: EXCISION UPPER BACK SEBACEOUS CYST;  Surgeon: [name], MD;  Location: [facility];  Service: General;  Laterality: N/A;  LOCAL ANESTHESIA   venous ligation     for varices left lower leg   Patient Active Problem List   Diagnosis Date Noted   Abnormality of gait 06/26/2021   Chronic hoarseness 10/04/2020   PAF (paroxysmal atrial fibrillation) ([place])    DDD (degenerative disc disease), lumbar 10/28/2017   Bilateral bunions 08/20/2016   Varicose veins of right lower extremity with complications [id number]   Bruit 10/10/2015   Varicose veins of leg with complications [id number]   Ankylosing  spondylitis ([place]) 01/14/2015   Chronic anticoagulation 01/14/2015   Sleep apnea 01/14/2015   S/P ablation of atrial fibrillation 01/08/2015   Persistent atrial fibrillation ([place]) 01/08/2015   Orthostatic dizziness 12/01/2013   Calf pain 11/22/2012   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/22/2012   Peripheral neuropathy 10/21/2012   Pain in joint, ankle and foot 09/21/2012   Disturbance of skin sensation 09/21/2012   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 ([place]) 09/21/2012   Other general symptoms(780.99) 09/21/2012   Heel pain 06/07/2012   Plantar fasciitis 06/07/2012   Mitral valve prolapse 06/03/2011   Pain in joint of right shoulder 04/03/2011   Rotator cuff syndrome of right shoulder 04/03/2011   Paroxysmal atrial fibrillation ([place]) 01/20/2011   Hypercholesterolemia 01/20/2011   OBSTRUCTIVE SLEEP APNEA 10/08/2009    REFERRING DIAG: Shoulder pain   THERAPY DIAG:  Chronic right shoulder pain  Stiffness of right shoulder, not elsewhere classified  Muscle weakness (generalized)  Rationale for Evaluation and Treatment Rehabilitation  PERTINENT HISTORY: Severe OA, worsening. Unable to abduct right arm above 90 degrees. Rotator cuff tests negative. Using Voltaren gel for pain relief. Recommend also trying OTC Arnica gel. Will refer to PT for strengthening and ROM exercises that are non-aggravating. If pain worsens, CSI is an option. No NSAIDs due  to taking Elliquis for Afib.   PRECAUTIONS: A fib, neuropathy  SUBJECTIVE:                                                                                                                                                                                      SUBJECTIVE STATEMENT: I am really doing well.  No shoulder pain or issues in the past few days.    PAIN:  Are you having pain? Yes: NPRS scale: none /10 Pain location: Rt arm  Pain description: achy, sore , joint pain  Aggravating factors: overhead reaching , some of the  exercises  Relieving factors: rest   OBJECTIVE: (objective measures completed at initial evaluation unless otherwise dated)     OBJECTIVE:    DIAGNOSTIC FINDINGS:  None recent    PATIENT SURVEYS:  FOTO 56% Status 09/09/22 60%   COGNITION: Overall cognitive status: Within functional limits for tasks assessed                                  SENSATION: WFL   POSTURE: Neck flexed and rotated L , asymmetrical shoulder height, thoracic flexion    UPPER EXTREMITY ROM:    Active ROM Right eval Left eval  Shoulder flexion 128 148  Shoulder extension WNL  WNL  Shoulder abduction 82  PROM to 116  150  Shoulder adduction      Shoulder internal rotation T11 T8  Shoulder external rotation T3 T5  Elbow flexion      Elbow extension      Wrist flexion      Wrist extension      Wrist ulnar deviation      Wrist radial deviation      Wrist pronation      Wrist supination      (Blank rows = not tested) Cervical ROM  Flexion 65 deg  Extension 30 deg  Rt SB 15 deg  Lt SB 30 deg  Rt rot.  45 deg  Lt. Rot 55 deg     UPPER EXTREMITY MMT:   MMT Right eval Left eval Rt 09/09/22  Shoulder flexion 4-/5 pain 4+/5 4-/5  Shoulder extension       Shoulder abduction 4-/5 pain  4+/5 4-/5 pain   Shoulder adduction       Shoulder internal rotation 4+/5 5/5   Shoulder external rotation 4/5 pain 5/5   Middle trapezius       Lower trapezius       Elbow flexion 4/5     Elbow extension       Wrist flexion  Wrist extension       Wrist ulnar deviation       Wrist radial deviation       Wrist pronation       Wrist supination       Grip strength (lbs)  73 67    (Blank rows = not tested)   JOINT MOBILITY TESTING:  Anticipates pain, GH head sits anteriorly in joint, stiff throughout    PALPATION:  Pain along Rt posterior lateral cervicals (splenius capitus), min pain in Rt upper trap  Rt superior shoulder TTP including Rt mid deltoid, lateal deltoid             TODAY'S  TREATMENT:            [facility] Adult PT Treatment:                                                DATE: 09/09/22 Therapeutic Exercise: Nustep L 5 UE and LE 6 min  Supine dowel flexion : 0-90 deg 5 lbs , painful Chest press 5 lbs x 8 with pain  No wgt overhead no pain x 10 AAROM ER with dowel x 15 , scaption x 10 , min pain  Diagonal pull red band  x10  ER unattached x 20 red  Sidelying scaption 1 lbs x 10 , reverse fly 1 lbs x 10 pain Wall for flexion facing in , facing out  Isometric abd, flexion x 10 , 5 sec  Self Care: Joint pain vs muscle pain  Shoulder anatomy and relation to posture   [facility] Adult PT Treatment:                                                DATE: 09/04/22 Therapeutic Exercise: UBE for 5 min L1  Springboard shoulder extension and row x 15  Forward flexion 2 x 15 with slastix  IR, ER slastix  Overhead stretch with towel  Wall for posture : red looped overhead lift, horizontal abduction, and double arm ER x 15  Upper trunk rotation x 6 each side  Added UE red band to Rt UE  Supine shoulder extension with knee lift , PT anchoring band red  Manual Therapy: PROM Rt UE all planes     [facility] Adult PT Treatment:                                                DATE: 09/01/22 Therapeutic Exercise: UBE level 1 , 3 min each direction AAROM cane overhead and ER , cross body abduct/adduct Forward raise 3 lbs (punch)  x 10  Long arm flexion 3 lbs x 10 supine vs standing (red band) x 10  Abduction red loop x 10  early range  ER red band x 15  Row blue band x 15  Extension blue band x 15  Horizontal abduction x 10 yellow band  Med level row red band  x 15  Lateral raise 3 lbs x 10  DATE: 08/20/22    [facility] Adult PT Treatment:                                                DATE: 08/28/22 Therapeutic Exercise: UBE 3 min and 3 min each direction Pulleys  overhead x 10, horizontal abd and then scaption x 10 each  Isometric abduction and external rotation 5 sec  x 10  Wall for posture and AAROM: overhead lift with UE ranger   Standing book openings: thoracic rotation x 5 , also done in supine  Row with retraction blue band  Extension with retraction blue band  External rotation green band x 15 added walk out for 30 sec x 3  Sidelying shoulder scaption (pain) and flexion (less pain 2 lbs) Sidelying ER 2 lbs x 15 Manual Therapy: PROM Rt UE ER and IR, abduction, brief     [facility] Adult PT Treatment:                                                DATE: 08/26/22 Therapeutic Exercise: Nustep  Supine and standing dowel x 15 no pain supine, some in standing  Supine ER with dowel x 10  Supine green theraband ER x 15  Horizontal abduction green x 15 too much crunching  Sidelying shoulder scaption (pain) and flexion (less pain 2 lbs) Sidelying ER 2 lbs x 15 Supine shoulder flexion 2 lbs x 15 with small circles x 10 each  Standing isometric abduction elbow bent x 10 and elbow extended  Wall ball rolls for shoulder flexion and trunk extension as well  Seated ER stretch at high mat table  Triceps black band with cues for posture x 10   Self Care: HEP modifications, form with exercises, isometric    PATIENT EDUCATION: Education details: HEP,modifications for shoulder exercises Person educated: Patient Education method: Consulting civil engineer, Demonstration, Verbal cues, and Handouts Education comprehension: verbalized understanding and needs further education   HOME EXERCISE PROGRAM: Access Code: [email or web address]  Access Code: [email or web address] URL: [email or web address] Date: 08/28/2022 Prepared by: Raeford Razor  Exercises - Supine Shoulder Flexion Extension AAROM with Dowel  - 1 x daily - 7 x weekly - 2 sets - 10 reps - 10 hold - Shoulder External Rotation with Anchored Resistance  - 1 x daily - 7 x weekly - 2 sets - 10 reps - 5 hold - Standing  Isometric Shoulder Abduction with Doorway - Arm Bent  - 1 x daily - 7 x weekly - 2 sets - 10 reps - 5 hold - Sidelying Open Book  - 1 x daily - 7 x weekly - 1 sets - 10 reps - 10-15 hold - Single Arm Shoulder Extension with Anchored Resistance  - 1 x daily - 7 x weekly - 2 sets - 10 reps - 5 hold -lateral and fw raise light    ASSESSMENT:   CLINICAL IMPRESSION: Patient cont with pain in flexion and abduction.  Discussed how to strengthen with minimal pain.  It is difficult to progress and overload his shoulder in the planes needed without increasing pain.  He definitely needs to work on improving posture and upper back strength but has poor carry over from session to session.  FOTO score improved  slightly. Cont POC>   OBJECTIVE IMPAIRMENTS: decreased mobility, decreased ROM, decreased strength, hypomobility, increased fascial restrictions, impaired flexibility, impaired UE functional use, postural dysfunction, and pain.    ACTIVITY LIMITATIONS: carrying, lifting, reach over head, and hygiene/grooming   PARTICIPATION LIMITATIONS: interpersonal relationship and community activity   PERSONAL FACTORS: Past/current experiences, Time since onset of injury/illness/exacerbation, and 1-2 comorbidities: previous shoulder issues, Ankylosing Spon.   are also affecting patient's functional outcome.    REHAB POTENTIAL: Good   CLINICAL DECISION MAKING: Stable/uncomplicated   EVALUATION COMPLEXITY: Low     GOALS: Goals reviewed with patient? No     LONG TERM GOALS: Target date: 10/01/2022     Pt will be able to improve FOTO score to 67%to demo improved comfort and use of Rt UE  Baseline: 56% Goal status: ongoing   2.  Pt will be able to demo 4+/5 in available Rt shoulder abduction and flexion to maximize shoulder function.   Baseline: 4-/5 pain  Goal status:ongoing    3.  Pt will be able to report no pain/difficulty carrying 10-15 lbs in Rt UE  alongside the body  Baseline: some  difficulty Goal status: ongoing    4.  Pt will understand posture and neck position as it relates to shoulder exercises Baseline: needs cues for all positions  Goal status: ongoing    5.  Pt will be able to reach Rt UE in scaption to 100 deg  to aid in ADLs and IADLs  Baseline: 09/09/22 :90 deg with pain  Goal status: ongoing    6.  Pt will be I with HEP upon DC for UEs, posture  Baseline:  Goal status: ongoing    PLAN:   PT FREQUENCY: 2x/week   PT DURATION: 6 weeks   PLANNED INTERVENTIONS: Therapeutic exercises, Therapeutic activity, Neuromuscular re-education, Balance training, Gait training, Patient/Family education, Self Care, Joint mobilization, Dry Needling, Cryotherapy, Moist heat, Manual therapy, and Re-evaluation   PLAN FOR NEXT SESSION:, T-spine rotation/posture and Rt UE within limits of pain     Kohl Polinsky, PT 09/09/2022, 11:55 AM   Raeford Razor, PT 09/09/22 11:55 AM Phone: [phone] Fax: [phone]

## 2022-09-09 ENCOUNTER — Ambulatory Visit: Payer: Medicare Other | Admitting: Physical Therapy

## 2022-09-09 ENCOUNTER — Encounter: Payer: Self-pay | Admitting: Physical Therapy

## 2022-09-09 DIAGNOSIS — M25611 Stiffness of right shoulder, not elsewhere classified: Secondary | ICD-10-CM

## 2022-09-09 DIAGNOSIS — M25511 Pain in right shoulder: Secondary | ICD-10-CM | POA: Diagnosis not present

## 2022-09-09 DIAGNOSIS — G8929 Other chronic pain: Secondary | ICD-10-CM | POA: Diagnosis not present

## 2022-09-09 DIAGNOSIS — M6281 Muscle weakness (generalized): Secondary | ICD-10-CM | POA: Diagnosis not present

## 2022-09-10 ENCOUNTER — Ambulatory Visit (INDEPENDENT_AMBULATORY_CARE_PROVIDER_SITE_OTHER): Payer: Medicare Other | Admitting: Podiatry

## 2022-09-10 ENCOUNTER — Encounter: Payer: Self-pay | Admitting: Podiatry

## 2022-09-10 DIAGNOSIS — G603 Idiopathic progressive neuropathy: Secondary | ICD-10-CM

## 2022-09-10 DIAGNOSIS — M2041 Other hammer toe(s) (acquired), right foot: Secondary | ICD-10-CM

## 2022-09-10 DIAGNOSIS — M2042 Other hammer toe(s) (acquired), left foot: Secondary | ICD-10-CM | POA: Diagnosis not present

## 2022-09-10 DIAGNOSIS — L600 Ingrowing nail: Secondary | ICD-10-CM

## 2022-09-10 NOTE — Patient Instructions (Signed)

## 2022-09-10 NOTE — Progress Notes (Signed)
Subjective:   Patient ID: Wesley [name]., male   DOB: 82 y.o.   MRN: [id number]   HPI Patient presents stating has had long-term numbness in both of his feet is on gabapentin and is starting to develop increased instability and also is developing a nail painful on the right big toe that is increasingly difficult to wear shoe gear with comfortably.  Patient also has digital deformity he wanted checked does not currently smoke tries to be active   Review of Systems  All other systems reviewed and are negative.       Objective:  Physical Exam Vitals and nursing note reviewed.  Constitutional:      Appearance: He is well-developed.  Pulmonary:     Effort: Pulmonary effort is normal.  Musculoskeletal:        General: Normal range of motion.  Skin:    General: Skin is warm.  Neurological:     Mental Status: He is alert.     Vascular status found to be intact muscle strength is adequate currently with patient found to have normal range of motion subtalar midtarsal joint.  Patient does have diminishment of neurological status sharp dull vibratory bilateral multiple signs especially clinically of neuropathic-like changes and has an incurvated medial border of the right hallux that is painful when pressed.  Good digital perfusion well-oriented and has chronic atrial fibrillation     Assessment:  Ingrown toenail deformity right hallux medial border painful along with chronic neuropathy idiopathic bilateral and digital deformity     Plan:  H&P x-rays reviewed spent a great deal time going over with him ways to try to keep his balance good and went over all different kinds of things he can do and today discussed ingrown toenail removal he wants to have it done I explained procedure risk explained to accept risk of procedure wants it done and today infiltrated the right hallux 60 mg like Marcaine mixture after he read and signed consent form and then went ahead did sterile prep of the toe  and using sterile instrumentation remove the medial border exposed matrix applied phenol 3 applications 30 seconds followed by alcohol lavage sterile dressing gave instructions on soaks and to wear dressing for 24 hours take it off earlier if throbbing were to occur and answered all questions for him today  X-rays indicate spur formation plantar heel bilateral moderate cavus foot structure no other pathology noted

## 2022-09-11 DIAGNOSIS — N401 Enlarged prostate with lower urinary tract symptoms: Secondary | ICD-10-CM | POA: Diagnosis not present

## 2022-09-11 DIAGNOSIS — N5201 Erectile dysfunction due to arterial insufficiency: Secondary | ICD-10-CM | POA: Diagnosis not present

## 2022-09-11 DIAGNOSIS — N4889 Other specified disorders of penis: Secondary | ICD-10-CM | POA: Diagnosis not present

## 2022-09-11 DIAGNOSIS — R948 Abnormal results of function studies of other organs and systems: Secondary | ICD-10-CM | POA: Diagnosis not present

## 2022-09-11 DIAGNOSIS — R6882 Decreased libido: Secondary | ICD-10-CM | POA: Diagnosis not present

## 2022-09-11 DIAGNOSIS — R351 Nocturia: Secondary | ICD-10-CM | POA: Diagnosis not present

## 2022-09-11 DIAGNOSIS — E291 Testicular hypofunction: Secondary | ICD-10-CM | POA: Diagnosis not present

## 2022-09-16 ENCOUNTER — Ambulatory Visit: Payer: Medicare Other | Admitting: Physical Therapy

## 2022-09-16 ENCOUNTER — Encounter: Payer: Self-pay | Admitting: Physical Therapy

## 2022-09-16 DIAGNOSIS — M6281 Muscle weakness (generalized): Secondary | ICD-10-CM

## 2022-09-16 DIAGNOSIS — G8929 Other chronic pain: Secondary | ICD-10-CM | POA: Diagnosis not present

## 2022-09-16 DIAGNOSIS — M25511 Pain in right shoulder: Secondary | ICD-10-CM | POA: Diagnosis not present

## 2022-09-16 DIAGNOSIS — M25611 Stiffness of right shoulder, not elsewhere classified: Secondary | ICD-10-CM

## 2022-09-16 NOTE — Therapy (Signed)
OUTPATIENT PHYSICAL THERAPY TREATMENT NOTE   Patient Name: Wesley [name]. MRN: [id number] DOB:05/18/1941, 82 y.o., male Today's Date: 09/16/2022  PCP: Vira Blanco, MD  REFERRING PROVIDER: Dr. Stefanie Libel   END OF SESSION:   PT End of Session - 09/16/22 1020     Visit Number 7    Number of Visits 12    Date for PT Re-Evaluation 10/01/22    Authorization Type MCR/Tricare    PT Start Time 1019    PT Stop Time 1100    PT Time Calculation (min) 41 min                 Past Medical History:  Diagnosis Date   A-fib ([place])    cardioversion x2   Ankylosing spondylitis ([facility])    Atrial flutter ([facility])    GERD (gastroesophageal reflux disease)    Hemorrhoids    Irregular heart beat    MVP (mitral valve prolapse)    WITH A MIDSYSTOLIC CLICK   OSA (obstructive sleep apnea)    uses CPAP nightly   Peripheral neuropathy    Vitamin B12 deficiency    Past Surgical History:  Procedure Laterality Date   ABLATION     CARDIOVERSION N/A 12/20/2019   Procedure: CARDIOVERSION;  Surgeon: Jerline Pain, MD;  Location: [place];  Service: Cardiovascular;  Laterality: N/A;   COLONOSCOPY     INGUINAL HERNIA REPAIR     LESION REMOVAL N/A 05/22/2022   Procedure: EXCISION UPPER BACK SEBACEOUS CYST;  Surgeon: [name], MD;  Location: [place];  Service: General;  Laterality: N/A;  LOCAL ANESTHESIA   venous ligation     for varices left lower leg   Patient Active Problem List   Diagnosis Date Noted   Abnormality of gait 06/26/2021   Chronic hoarseness 10/04/2020   PAF (paroxysmal atrial fibrillation) ([place])    DDD (degenerative disc disease), lumbar 10/28/2017   Bilateral bunions 08/20/2016   Varicose veins of right lower extremity with complications [id number]   Bruit 10/10/2015   Varicose veins of leg with complications [id number]   Ankylosing spondylitis ([place]) 01/14/2015   Chronic anticoagulation 01/14/2015   Sleep apnea 01/14/2015   S/P ablation of  atrial fibrillation 01/08/2015   Persistent atrial fibrillation ([place]) 01/08/2015   Orthostatic dizziness 12/01/2013   Calf pain 11/22/2012   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/22/2012   Peripheral neuropathy 10/21/2012   Pain in joint, ankle and foot 09/21/2012   Disturbance of skin sensation 09/21/2012   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 ([place]) 09/21/2012   Other general symptoms(780.99) 09/21/2012   Heel pain 06/07/2012   Plantar fasciitis 06/07/2012   Mitral valve prolapse 06/03/2011   Pain in joint of right shoulder 04/03/2011   Rotator cuff syndrome of right shoulder 04/03/2011   Paroxysmal atrial fibrillation ([place]) 01/20/2011   Hypercholesterolemia 01/20/2011   OBSTRUCTIVE SLEEP APNEA 10/08/2009    REFERRING DIAG: Shoulder pain   THERAPY DIAG:  Chronic right shoulder pain  Stiffness of right shoulder, not elsewhere classified  Muscle weakness (generalized)  Rationale for Evaluation and Treatment Rehabilitation  PERTINENT HISTORY: Severe OA, worsening. Unable to abduct right arm above 90 degrees. Rotator cuff tests negative. Using Voltaren gel for pain relief. Recommend also trying OTC Arnica gel. Will refer to PT for strengthening and ROM exercises that are non-aggravating. If pain worsens, CSI is an option. No NSAIDs due to taking Elliquis for Afib.   PRECAUTIONS: A fib, neuropathy  SUBJECTIVE:  SUBJECTIVE STATEMENT: No pain right now.  I brought my binder to go through my routine.    PAIN:  Are you having pain? Yes: NPRS scale: none /10 Pain location: Rt arm  Pain description: achy, sore , joint pain  Aggravating factors: overhead reaching , some of the exercises  Relieving factors: rest   OBJECTIVE: (objective measures completed at initial evaluation unless otherwise  dated)     OBJECTIVE:    DIAGNOSTIC FINDINGS:  None recent    PATIENT SURVEYS:  FOTO 56% Status 09/09/22 60%    COGNITION: Overall cognitive status: Within functional limits for tasks assessed                                  SENSATION: WFL   POSTURE: Neck flexed and rotated L , asymmetrical shoulder height, thoracic flexion    UPPER EXTREMITY ROM:    Active ROM Right eval Left eval Rt  09/16/22  Shoulder flexion 128 148 128 deg   Shoulder extension WNL  WNL   Shoulder abduction 82  PROM to 116  150 90 deg   Scaption 98 deg   Shoulder adduction       Shoulder internal rotation T11 T8   Shoulder external rotation T3 T5   Elbow flexion       Elbow extension       Wrist flexion       Wrist extension       Wrist ulnar deviation       Wrist radial deviation       Wrist pronation       Wrist supination       (Blank rows = not tested) Cervical ROM  Flexion 65 deg  Extension 30 deg  Rt SB 15 deg  Lt SB 30 deg  Rt rot.  45 deg  Lt. Rot 55 deg     UPPER EXTREMITY MMT:   MMT Right eval Left eval Rt 09/09/22  Shoulder flexion 4-/5 pain 4+/5 4-/5  Shoulder extension       Shoulder abduction 4-/5 pain  4+/5 4-/5 pain   Shoulder adduction       Shoulder internal rotation 4+/5 5/5   Shoulder external rotation 4/5 pain 5/5   Middle trapezius       Lower trapezius       Elbow flexion 4/5     Elbow extension       Wrist flexion       Wrist extension       Wrist ulnar deviation       Wrist radial deviation       Wrist pronation       Wrist supination       Grip strength (lbs)  73 67    (Blank rows = not tested)   JOINT MOBILITY TESTING:  Anticipates pain, GH head sits anteriorly in joint, stiff throughout    PALPATION:  Pain along Rt posterior lateral cervicals (splenius capitus), min pain in Rt upper trap  Rt superior shoulder TTP including Rt mid deltoid, lateal deltoid             TODAY'S TREATMENT:            [facility] Adult PT Treatment:  DATE: 09/16/22 Therapeutic Exercise: Extension blue band x 15 High row x 15 blue  External rotation blue x 15 bilateral  AROM measurement  Supine single arm horizontal abduction red band x 15  Single arm flexion red band x 15  Sidelying open book Rt side  Wall push ups x 10  Wall "bird dog"  x 10 for core Manual Therapy: Scapular mobilizations  Self Care: HEP review and streamline , organize per pt request    [facility] Adult PT Treatment:                                                DATE: 09/09/22 Therapeutic Exercise: Nustep L 5 UE and LE 6 min  Supine dowel flexion : 0-90 deg 5 lbs , painful Chest press 5 lbs x 8 with pain  No wgt overhead no pain x 10 AAROM ER with dowel x 15 , scaption x 10 , min pain  Diagonal pull red band  x10  ER unattached x 20 red  Sidelying scaption 1 lbs x 10 , reverse fly 1 lbs x 10 pain Wall for flexion facing in , facing out  Isometric abd, flexion x 10 , 5 sec  Self Care: Joint pain vs muscle pain  Shoulder anatomy and relation to posture   [facility] Adult PT Treatment:                                                DATE: 09/04/22 Therapeutic Exercise: UBE for 5 min L1  Springboard shoulder extension and row x 15  Forward flexion 2 x 15 with slastix  IR, ER slastix  Overhead stretch with towel  Wall for posture : red looped overhead lift, horizontal abduction, and double arm ER x 15  Upper trunk rotation x 6 each side  Added UE red band to Rt UE  Supine shoulder extension with knee lift , PT anchoring band red  Manual Therapy: PROM Rt UE all planes     [facility] Adult PT Treatment:                                                DATE: 09/01/22 Therapeutic Exercise: UBE level 1 , 3 min each direction AAROM cane overhead and ER , cross body abduct/adduct Forward raise 3 lbs (punch)  x 10  Long arm flexion 3 lbs x 10 supine vs standing (red band) x 10  Abduction red loop x 10  early range  ER red band x 15  Row blue  band x 15  Extension blue band x 15  Horizontal abduction x 10 yellow band  Med level row red band  x 15  Lateral raise 3 lbs x 10  DATE: 08/20/22    [facility] Adult PT Treatment:                                                DATE: 08/28/22 Therapeutic Exercise: UBE 3 min and 3 min each direction Pulleys overhead x 10, horizontal abd and then scaption x 10 each  Isometric abduction and external rotation 5 sec  x 10  Wall for posture and AAROM: overhead lift with UE ranger   Standing book openings: thoracic rotation x 5 , also done in supine  Row with retraction blue band  Extension with retraction blue band  External rotation green band x 15 added walk out for 30 sec x 3  Sidelying shoulder scaption (pain) and flexion (less pain 2 lbs) Sidelying ER 2 lbs x 15 Manual Therapy: PROM Rt UE ER and IR, abduction, brief     [facility] Adult PT Treatment:                                                DATE: 08/26/22 Therapeutic Exercise: Nustep  Supine and standing dowel x 15 no pain supine, some in standing  Supine ER with dowel x 10  Supine green theraband ER x 15  Horizontal abduction green x 15 too much crunching  Sidelying shoulder scaption (pain) and flexion (less pain 2 lbs) Sidelying ER 2 lbs x 15 Supine shoulder flexion 2 lbs x 15 with small circles x 10 each  Standing isometric abduction elbow bent x 10 and elbow extended  Wall ball rolls for shoulder flexion and trunk extension as well  Seated ER stretch at high mat table  Triceps black band with cues for posture x 10   Self Care: HEP modifications, form with exercises, isometric    PATIENT EDUCATION: Education details: HEP,modifications for shoulder exercises Person educated: Patient Education method: Consulting civil engineer, Demonstration, Verbal cues, and Handouts Education comprehension: verbalized understanding  and needs further education   HOME EXERCISE PROGRAM: Access Code: [email or web address]  Access Code: [email or web address] URL: [email or web address] Date: 08/28/2022 Prepared by: Raeford Razor  Exercises - Supine Shoulder Flexion Extension AAROM with Dowel  - 1 x daily - 7 x weekly - 2 sets - 10 reps - 10 hold - Shoulder External Rotation with Anchored Resistance  - 1 x daily - 7 x weekly - 2 sets - 10 reps - 5 hold - Standing Isometric Shoulder Abduction with Doorway - Arm Bent  - 1 x daily - 7 x weekly - 2 sets - 10 reps - 5 hold - Sidelying Open Book  - 1 x daily - 7 x weekly - 1 sets - 10 reps - 10-15 hold - Single Arm Shoulder Extension with Anchored Resistance  - 1 x daily - 7 x weekly - 2 sets - 10 reps - 5 hold -lateral and fw raise light    ASSESSMENT:   CLINICAL IMPRESSION: Patient has gained AROM in abduction but with continued pain.  He will come 1 more visit to give him time to work on his routine and come back with potential questions. He had no pain after session.   OBJECTIVE IMPAIRMENTS: decreased mobility, decreased ROM, decreased strength, hypomobility, increased fascial restrictions, impaired flexibility, impaired UE functional use, postural dysfunction,  and pain.    ACTIVITY LIMITATIONS: carrying, lifting, reach over head, and hygiene/grooming   PARTICIPATION LIMITATIONS: interpersonal relationship and community activity   PERSONAL FACTORS: Past/current experiences, Time since onset of injury/illness/exacerbation, and 1-2 comorbidities: previous shoulder issues, Ankylosing Spon.   are also affecting patient's functional outcome.    REHAB POTENTIAL: Good   CLINICAL DECISION MAKING: Stable/uncomplicated   EVALUATION COMPLEXITY: Low     GOALS: Goals reviewed with patient? No     LONG TERM GOALS: Target date: 10/01/2022     Pt will be able to improve FOTO score to 67%to demo improved comfort and use of Rt UE  Baseline: 56% Goal status: ongoing   2.  Pt will be able  to demo 4+/5 in available Rt shoulder abduction and flexion to maximize shoulder function.   Baseline: 4-/5 pain  Goal status:ongoing    3.  Pt will be able to report no pain/difficulty carrying 10-15 lbs in Rt UE  alongside the body  Baseline: some difficulty Goal status: ongoing    4.  Pt will understand posture and neck position as it relates to shoulder exercises Baseline: needs cues for all positions  Goal status: ongoing    5.  Pt will be able to reach Rt UE in scaption to 100 deg  to aid in ADLs and IADLs  Baseline: 09/09/22 :90 deg with pain  Goal status: ongoing    6.  Pt will be I with HEP upon DC for UEs, posture  Baseline:  Goal status: ongoing    PLAN:   PT FREQUENCY: 2x/week   PT DURATION: 6 weeks   PLANNED INTERVENTIONS: Therapeutic exercises, Therapeutic activity, Neuromuscular re-education, Balance training, Gait training, Patient/Family education, Self Care, Joint mobilization, Dry Needling, Cryotherapy, Moist heat, Manual therapy, and Re-evaluation   PLAN FOR NEXT SESSION:, Goal check, MMT, finish POC     Glenisha Gundry, PT 09/16/2022, 11:57 AM   Raeford Razor, PT 09/16/22 11:57 AM Phone: [phone] Fax: [phone]

## 2022-09-18 ENCOUNTER — Ambulatory Visit: Payer: Medicare Other | Admitting: Physical Therapy

## 2022-09-23 ENCOUNTER — Ambulatory Visit: Payer: Medicare Other | Admitting: Physical Therapy

## 2022-09-23 ENCOUNTER — Encounter: Payer: Self-pay | Admitting: Physical Therapy

## 2022-09-23 DIAGNOSIS — M25611 Stiffness of right shoulder, not elsewhere classified: Secondary | ICD-10-CM

## 2022-09-23 DIAGNOSIS — M25511 Pain in right shoulder: Secondary | ICD-10-CM | POA: Diagnosis not present

## 2022-09-23 DIAGNOSIS — M6281 Muscle weakness (generalized): Secondary | ICD-10-CM

## 2022-09-23 DIAGNOSIS — G8929 Other chronic pain: Secondary | ICD-10-CM | POA: Diagnosis not present

## 2022-09-23 NOTE — Therapy (Signed)
OUTPATIENT PHYSICAL THERAPY TREATMENT NOTE DISCHARGE   Patient Name: Wesley [name]. MRN: [id number] DOB:1941/07/16, 82 y.o., male Today's Date: 09/23/2022  PCP: Vira Blanco, MD  REFERRING PROVIDER: Dr. Stefanie Libel   END OF SESSION:   PT End of Session - 09/23/22 1421     Visit Number 8    Number of Visits 12    Date for PT Re-Evaluation 10/01/22    Authorization Type MCR/Tricare    PT Start Time 1417    PT Stop Time 1500    PT Time Calculation (min) 43 min    Activity Tolerance Patient tolerated treatment well    Behavior During Therapy WFL for tasks assessed/performed                  Past Medical History:  Diagnosis Date   A-fib ([place])    cardioversion x2   Ankylosing spondylitis ([place])    Atrial flutter ([facility])    GERD (gastroesophageal reflux disease)    Hemorrhoids    Irregular heart beat    MVP (mitral valve prolapse)    WITH A MIDSYSTOLIC CLICK   OSA (obstructive sleep apnea)    uses CPAP nightly   Peripheral neuropathy    Vitamin B12 deficiency    Past Surgical History:  Procedure Laterality Date   ABLATION     CARDIOVERSION N/A 12/20/2019   Procedure: CARDIOVERSION;  Surgeon: Jerline Pain, MD;  Location: [place];  Service: Cardiovascular;  Laterality: N/A;   COLONOSCOPY     INGUINAL HERNIA REPAIR     LESION REMOVAL N/A 05/22/2022   Procedure: EXCISION UPPER BACK SEBACEOUS CYST;  Surgeon: [name], MD;  Location: [place];  Service: General;  Laterality: N/A;  LOCAL ANESTHESIA   venous ligation     for varices left lower leg   Patient Active Problem List   Diagnosis Date Noted   Abnormality of gait 06/26/2021   Chronic hoarseness 10/04/2020   PAF (paroxysmal atrial fibrillation) ([place])    DDD (degenerative disc disease), lumbar 10/28/2017   Bilateral bunions 08/20/2016   Varicose veins of right lower extremity with complications [id number]   Bruit 10/10/2015   Varicose veins of leg with complications [id number]    Ankylosing spondylitis ([place]) 01/14/2015   Chronic anticoagulation 01/14/2015   Sleep apnea 01/14/2015   S/P ablation of atrial fibrillation 01/08/2015   Persistent atrial fibrillation ([place]) 01/08/2015   Orthostatic dizziness 12/01/2013   Calf pain 11/22/2012   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/22/2012   Peripheral neuropathy 10/21/2012   Pain in joint, ankle and foot 09/21/2012   Disturbance of skin sensation 09/21/2012   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 ([place]) 09/21/2012   Other general symptoms(780.99) 09/21/2012   Heel pain 06/07/2012   Plantar fasciitis 06/07/2012   Mitral valve prolapse 06/03/2011   Pain in joint of right shoulder 04/03/2011   Rotator cuff syndrome of right shoulder 04/03/2011   Paroxysmal atrial fibrillation ([place]) 01/20/2011   Hypercholesterolemia 01/20/2011   OBSTRUCTIVE SLEEP APNEA 10/08/2009    REFERRING DIAG: Shoulder pain   THERAPY DIAG:  Chronic right shoulder pain  Stiffness of right shoulder, not elsewhere classified  Muscle weakness (generalized)  Rationale for Evaluation and Treatment Rehabilitation  PERTINENT HISTORY: Severe OA, worsening. Unable to abduct right arm above 90 degrees. Rotator cuff tests negative. Using Voltaren gel for pain relief. Recommend also trying OTC Arnica gel. Will refer to PT for strengthening and ROM exercises that are non-aggravating. If pain worsens, CSI is an option. No  NSAIDs due to taking Elliquis for Afib.   PRECAUTIONS: A fib, neuropathy  SUBJECTIVE:                                                                                                                                                                                      SUBJECTIVE STATEMENT: Patient is ready for discharge today.  He states he is tired but has no shoulder pain.  He feels he has a good routine at the gym in his home for his shoulder.  PAIN:  Are you having pain? Yes: NPRS scale: none /10 Pain location: Rt arm  Pain  description: achy, sore , joint pain  Aggravating factors: overhead reaching , some of the exercises  Relieving factors: rest   OBJECTIVE: (objective measures completed at initial evaluation unless otherwise dated)     OBJECTIVE:    DIAGNOSTIC FINDINGS:  None recent    PATIENT SURVEYS:  FOTO 56% Status 09/09/22 60% 09/23/22  61% on discharge     COGNITION: Overall cognitive status: Within functional limits for tasks assessed                                  SENSATION: WFL   POSTURE: Neck flexed and rotated L , asymmetrical shoulder height, thoracic flexion    UPPER EXTREMITY ROM:    Active ROM Right eval Left eval Rt  09/16/22 Rt 09/23/22  Shoulder flexion 128 148 128 deg  129  Shoulder extension WNL  WNL    Shoulder abduction 82  PROM to 116  150 90 deg   Scaption 98 deg  95  Shoulder adduction        Shoulder internal rotation T11 T8  T8  Shoulder external rotation T3 T5  T5  Elbow flexion        Elbow extension        Wrist flexion        Wrist extension        Wrist ulnar deviation        Wrist radial deviation        Wrist pronation        Wrist supination        (Blank rows = not tested) Cervical ROM  Flexion 65 deg  Extension 30 deg  Rt SB 15 deg  Lt SB 30 deg  Rt rot.  45 deg  Lt. Rot 55 deg     UPPER EXTREMITY MMT:   MMT Right eval Left eval Rt 09/09/22 R 09/23/22  Shoulder flexion 4-/5 pain 4+/5 4-/5 4/5  Shoulder extension  Shoulder abduction 4-/5 pain  4+/5 4-/5 pain  4-/5  Shoulder adduction        Shoulder internal rotation 4+/5 5/5  5/5  Shoulder external rotation 4/5 pain 5/5  4/5  Middle trapezius        Lower trapezius        Elbow flexion 4/5      Elbow extension        Wrist flexion        Wrist extension        Wrist ulnar deviation        Wrist radial deviation        Wrist pronation        Wrist supination        Grip strength (lbs)  73 67     (Blank rows = not tested)   JOINT MOBILITY TESTING:  Anticipates  pain, GH head sits anteriorly in joint, stiff throughout    PALPATION:  Pain along Rt posterior lateral cervicals (splenius capitus), min pain in Rt upper trap  Rt superior shoulder TTP including Rt mid deltoid, lateal deltoid             TODAY'S TREATMENT:             [facility] Adult PT Treatment:                                                DATE: 09/23/22 Therapeutic Exercise: UBE 5 min Level 3 , each direction 2.5 min  Standing row blue band x 15, 2 sets bicep row  Standing extension blue band x 15  External rotation blue x 15  Internal rotation blue x 15  Supine dowel flexion x 15 wide arms Green band Er and IR with PT anchoring, abd to 45 deg  x 15  Narrow grip overhead lift x 10  Manual Therapy: PROM Rt UE all planes  Self Care: Foto, goals, HEP   [facility] Adult PT Treatment:                                                DATE: 09/16/22 Therapeutic Exercise: Extension blue band x 15 High row x 15 blue  External rotation blue x 15 bilateral  AROM measurement  Supine single arm horizontal abduction red band x 15  Single arm flexion red band x 15  Sidelying open book Rt side  Wall push ups x 10  Wall "bird dog"  x 10 for core Manual Therapy: Scapular mobilizations  Self Care: HEP review and streamline , organize per pt request    [facility] Adult PT Treatment:                                                DATE: 09/09/22 Therapeutic Exercise: Nustep L 5 UE and LE 6 min  Supine dowel flexion : 0-90 deg 5 lbs , painful Chest press 5 lbs x 8 with pain  No wgt overhead no pain x 10 AAROM ER with dowel x 15 , scaption x 10 , min pain  Diagonal pull red band  x10  ER unattached x 20 red  Sidelying scaption 1 lbs x 10 , reverse fly 1 lbs x 10 pain Wall for flexion facing in , facing out  Isometric abd, flexion x 10 , 5 sec  Self Care: Joint pain vs muscle pain  Shoulder anatomy and relation to posture   PATIENT EDUCATION: Education details: HEP,modifications for shoulder  exercises Person educated: Patient Education method: Consulting civil engineer, Demonstration, Verbal cues, and Handouts Education comprehension: verbalized understanding and needs further education   HOME EXERCISE PROGRAM: Access Code: [email or web address]  Access Code: [email or web address] URL: [email or web address] Date: 08/28/2022 Prepared by: Raeford Razor  Exercises - Supine Shoulder Flexion Extension AAROM with Dowel  - 1 x daily - 7 x weekly - 2 sets - 10 reps - 10 hold - Shoulder External Rotation with Anchored Resistance  - 1 x daily - 7 x weekly - 2 sets - 10 reps - 5 hold - Standing Isometric Shoulder Abduction with Doorway - Arm Bent  - 1 x daily - 7 x weekly - 2 sets - 10 reps - 5 hold - Sidelying Open Book  - 1 x daily - 7 x weekly - 1 sets - 10 reps - 10-15 hold - Single Arm Shoulder Extension with Anchored Resistance  - 1 x daily - 7 x weekly - 2 sets - 10 reps - 5 hold -lateral and fw raise light    ASSESSMENT:   CLINICAL IMPRESSION: Patient has maximized his rehab potential.  He has an understanding of a full home exercise program for his right shoulder.  Pain is mostly only with abduction.  He has gained some strength in all planes of motion.  He continues to need cues for form posture during standing exercises.  He is discharged from therapy at this time.  OBJECTIVE IMPAIRMENTS: decreased mobility, decreased ROM, decreased strength, hypomobility, increased fascial restrictions, impaired flexibility, impaired UE functional use, postural dysfunction, and pain.    ACTIVITY LIMITATIONS: carrying, lifting, reach over head, and hygiene/grooming   PARTICIPATION LIMITATIONS: interpersonal relationship and community activity   PERSONAL FACTORS: Past/current experiences, Time since onset of injury/illness/exacerbation, and 1-2 comorbidities: previous shoulder issues, Ankylosing Spon.   are also affecting patient's functional outcome.    REHAB POTENTIAL: Good   CLINICAL DECISION MAKING:  Stable/uncomplicated   EVALUATION COMPLEXITY: Low     GOALS: Goals reviewed with patient? No     LONG TERM GOALS: Target date: 10/01/2022     Pt will be able to improve FOTO score to 67%to demo improved comfort and use of Rt UE  Baseline: 56%, 61% Goal status: partially met    2.  Pt will be able to demo 4+/5 in available Rt shoulder abduction and flexion to maximize shoulder function.   Baseline: 4-/5 pain  Goal status: not MET  , see above    3.  Pt will be able to report no pain/difficulty carrying 10-15 lbs in Rt UE  alongside the body  Baseline: some difficulty Goal status: MET    4.  Pt will understand posture and neck position as it relates to shoulder exercises Baseline: needs cues for all positions  Goal status: partially met, still needs cues    5.  Pt will be able to reach Rt UE in scaption to 100 deg  to aid in ADLs and IADLs  Baseline: 09/09/22 :90 deg with pain , painful to 90 and above  Goal status: not met    6.  Pt will be I with HEP upon DC for UEs,  posture  Baseline:  Goal status:MET   PLAN:   PT FREQUENCY: 2x/week   PT DURATION: 6 weeks   PLANNED INTERVENTIONS: Therapeutic exercises, Therapeutic activity, Neuromuscular re-education, Balance training, Gait training, Patient/Family education, Self Care, Joint mobilization, Dry Needling, Cryotherapy, Moist heat, Manual therapy, and Re-evaluation   PLAN FOR NEXT SESSION:,NA     Angus Amini, PT 09/23/2022, 6:35 PM    PHYSICAL THERAPY DISCHARGE SUMMARY  Visits from Start of Care: 8  Current functional level related to goals / functional outcomes: See above most recent information   Remaining deficits: Posture, right shoulder range of motion, strength, crepitus   Education / Equipment: Posture, home exercise program, exercise programming  Patient agrees to discharge. Patient goals were partially met. Patient is being discharged due to maximized rehab potential.   Raeford Razor, PT 09/23/22  6:35 PM Phone: [phone] Fax: [phone]

## 2022-10-15 DIAGNOSIS — L309 Dermatitis, unspecified: Secondary | ICD-10-CM | POA: Diagnosis not present

## 2022-10-15 DIAGNOSIS — D1801 Hemangioma of skin and subcutaneous tissue: Secondary | ICD-10-CM | POA: Diagnosis not present

## 2022-10-15 DIAGNOSIS — L821 Other seborrheic keratosis: Secondary | ICD-10-CM | POA: Diagnosis not present

## 2022-10-15 DIAGNOSIS — B009 Herpesviral infection, unspecified: Secondary | ICD-10-CM | POA: Diagnosis not present

## 2022-10-15 DIAGNOSIS — I8391 Asymptomatic varicose veins of right lower extremity: Secondary | ICD-10-CM | POA: Diagnosis not present

## 2022-10-15 DIAGNOSIS — Z85828 Personal history of other malignant neoplasm of skin: Secondary | ICD-10-CM | POA: Diagnosis not present

## 2022-10-15 DIAGNOSIS — I8392 Asymptomatic varicose veins of left lower extremity: Secondary | ICD-10-CM | POA: Diagnosis not present

## 2022-10-15 DIAGNOSIS — I788 Other diseases of capillaries: Secondary | ICD-10-CM | POA: Diagnosis not present

## 2022-10-15 DIAGNOSIS — B0089 Other herpesviral infection: Secondary | ICD-10-CM | POA: Diagnosis not present

## 2022-10-15 DIAGNOSIS — B9689 Other specified bacterial agents as the cause of diseases classified elsewhere: Secondary | ICD-10-CM | POA: Diagnosis not present

## 2022-10-15 DIAGNOSIS — C4441 Basal cell carcinoma of skin of scalp and neck: Secondary | ICD-10-CM | POA: Diagnosis not present

## 2022-10-29 DIAGNOSIS — M1991 Primary osteoarthritis, unspecified site: Secondary | ICD-10-CM | POA: Diagnosis not present

## 2022-10-29 DIAGNOSIS — I480 Paroxysmal atrial fibrillation: Secondary | ICD-10-CM | POA: Diagnosis not present

## 2022-10-29 DIAGNOSIS — M450 Ankylosing spondylitis of multiple sites in spine: Secondary | ICD-10-CM | POA: Diagnosis not present

## 2022-10-29 DIAGNOSIS — Z6826 Body mass index (BMI) 26.0-26.9, adult: Secondary | ICD-10-CM | POA: Diagnosis not present

## 2022-10-29 DIAGNOSIS — E663 Overweight: Secondary | ICD-10-CM | POA: Diagnosis not present

## 2022-10-29 DIAGNOSIS — M5136 Other intervertebral disc degeneration, lumbar region: Secondary | ICD-10-CM | POA: Diagnosis not present

## 2022-11-05 ENCOUNTER — Other Ambulatory Visit: Payer: Self-pay | Admitting: Cardiology

## 2022-11-05 DIAGNOSIS — I480 Paroxysmal atrial fibrillation: Secondary | ICD-10-CM

## 2022-11-06 NOTE — Telephone Encounter (Signed)
Eliquis 5mg  refill request received. Patient is 82 years old, weight-94.3kg, Crea-0.75 on 03/04/22, Diagnosis-Afib, and last seen by Juanda Crumble, PA on 05/06/22. Dose is appropriate based on dosing criteria. Will send in refill to requested pharmacy.   Pt sees 2 cardiologist groups per last OV note which states: Keep follow-up with Dr. Delena Serve at [facility] in November.  Follow-up with Dr. Jens Som in 1 year unless issues prior to then.

## 2022-11-11 NOTE — Progress Notes (Unsigned)
HPI  male former smoker followed for OSA, complicated by A. fib/ablation/Eliquis, peripheral neuropathy NPSG 10/24/09-AHI 21.8/hour, desat to 86%, body weight 230 pounds HST-07/06/2018-AHI 17.5/hour, desaturation to 82%, body weight 216 pound  -----------------------------------------------------------------------------------------------  11/10/21- 82 yo male former smoker followed for OSA, complicated by A. fib/ablation/Eliquis, peripheral neuropathy, macular degeneration, Ankylosing Spondylitis (remote dx, inactive) CPAP auto 8-15/ [facility]    Download compliance 100%, AHI 6.2/ hr Body weight today- Covid vax-5 Phizer Flu vax-had -----Patient was told he has "muscle fatigue" when he went to the ENT and was told it was mostly age related. Patient is doing good otherwise. Download reviewed. Comfortable and satisfied for now. We would like fewer breakthrough apneas- will watch this but it doesn't look like a pressure setting issue. Discussed CPAP v Inspire option and can refer if he decides he wants. He has seen Dr Annalee Genta before.  CXR 05/03/19- IMPRESSION: No active cardiopulmonary disease.  11/12/22-  82 yo male former smoker followed for OSA, complicated by A. fib/ablation/Eliquis, peripheral neuropathy, macular degeneration, Ankylosing Spondylitis (remote dx, inactive), GERD,  CPAP auto 8-15/ [facility]    AirSense 10 AutoSet Download compliance 100%, AHI 7.4/ hr   mostly centrals Body weight today-211 lbs -----Pt is doing okay  Download reviewed.  Note he is having a few central apneas, clinically insignificant but discussed. He continues to struggle with chronic back discomfort, not looking for spine surgery anytime soon. He is planning trip to [place] and we discussed options of not using the humidifier for convenience.  He could substitute nasal saline gel.  ROS-see HPI   + = positive Constitutional:    weight loss, night sweats, fevers, chills, fatigue, lassitude. HEENT:    headaches,  difficulty swallowing, tooth/dental problems, sore throat,       sneezing, itching, ear ache, nasal congestion, post nasal drip, snoring CV:    chest pain, orthopnea, PND, swelling in lower extremities, anasarca,                                   dizziness, palpitations Resp:   shortness of breath with exertion or at rest.                productive cough,   non-productive cough, coughing up of blood.              change in color of mucus.  wheezing.   Skin:    rash or lesions. GI:  No-   heartburn, indigestion, abdominal pain, nausea, vomiting, diarrhea,                 change in bowel habits, loss of appetite GU: dysuria, change in color of urine, no urgency or frequency.   flank pain. MS:   +joint pain, stiffness, decreased range of motion, back pain. Neuro-     nothing unusual Psych:  change in mood or affect.  depression or anxiety.   memory loss.  OBJ- Physical Exam General- Alert, Oriented, Affect-appropriate, Distress- none acute, +tall/lean, + squirming restlessly in chair Skin- rash-none, lesions- none, excoriation- none Lymphadenopathy- none Head- atraumatic            Eyes- Gross vision intact, PERRLA, conjunctivae and secretions clear            Ears- Hearing, canals-normal            Nose- Clear, no-Septal dev, mucus, polyps, erosion, perforation  Throat- Mallampati II , mucosa clear , drainage- none, tonsils- atrophic Neck- flexible , trachea midline, no stridor , thyroid nl, carotid no bruit Chest - symmetrical excursion , unlabored           Heart/CV- RRR , no murmur , no gallop  , no rub, nl s1 s2                           - JVD- none , edema- none, stasis changes- none, varices- none           Lung- clear to P&A, wheeze- none, cough- none , dullness-none, rub- none           Chest wall-  Abd-  Br/ Gen/ Rectal- Not done, not indicated Extrem- cyanosis- none, clubbing, none, atrophy- none, strength- nl Neuro- grossly intact to  observation

## 2022-11-12 ENCOUNTER — Ambulatory Visit (INDEPENDENT_AMBULATORY_CARE_PROVIDER_SITE_OTHER): Payer: Medicare Other | Admitting: Internal Medicine

## 2022-11-12 ENCOUNTER — Encounter: Payer: Self-pay | Admitting: Internal Medicine

## 2022-11-12 VITALS — BP 120/62 | HR 72 | Ht 76.0 in | Wt 211.4 lb

## 2022-11-12 DIAGNOSIS — G4733 Obstructive sleep apnea (adult) (pediatric): Secondary | ICD-10-CM | POA: Diagnosis not present

## 2022-11-12 DIAGNOSIS — I4819 Other persistent atrial fibrillation: Secondary | ICD-10-CM

## 2022-11-12 NOTE — Assessment & Plan Note (Signed)
Benefits from CPAP with a few central apneas noted. Plan-continue auto 8-15

## 2022-11-12 NOTE — Assessment & Plan Note (Signed)
Rhythm was very nearly regular at this visit

## 2022-11-12 NOTE — Patient Instructions (Signed)
We can continue CPAP 8-15  If you try CPAP without running the humidifier, you might try the otc nasal saline gel - any brand

## 2022-11-24 DIAGNOSIS — Z85828 Personal history of other malignant neoplasm of skin: Secondary | ICD-10-CM | POA: Diagnosis not present

## 2022-11-24 DIAGNOSIS — B0089 Other herpesviral infection: Secondary | ICD-10-CM | POA: Diagnosis not present

## 2022-12-01 DIAGNOSIS — Z79899 Other long term (current) drug therapy: Secondary | ICD-10-CM | POA: Diagnosis not present

## 2022-12-01 DIAGNOSIS — Z5181 Encounter for therapeutic drug level monitoring: Secondary | ICD-10-CM | POA: Diagnosis not present

## 2022-12-01 DIAGNOSIS — I4819 Other persistent atrial fibrillation: Secondary | ICD-10-CM | POA: Diagnosis not present

## 2022-12-01 DIAGNOSIS — Z9889 Other specified postprocedural states: Secondary | ICD-10-CM | POA: Diagnosis not present

## 2022-12-01 DIAGNOSIS — Z8679 Personal history of other diseases of the circulatory system: Secondary | ICD-10-CM | POA: Diagnosis not present

## 2022-12-09 DIAGNOSIS — H26492 Other secondary cataract, left eye: Secondary | ICD-10-CM | POA: Diagnosis not present

## 2022-12-09 DIAGNOSIS — H353122 Nonexudative age-related macular degeneration, left eye, intermediate dry stage: Secondary | ICD-10-CM | POA: Diagnosis not present

## 2022-12-09 DIAGNOSIS — H353212 Exudative age-related macular degeneration, right eye, with inactive choroidal neovascularization: Secondary | ICD-10-CM | POA: Diagnosis not present

## 2022-12-09 DIAGNOSIS — H43813 Vitreous degeneration, bilateral: Secondary | ICD-10-CM | POA: Diagnosis not present

## 2022-12-24 ENCOUNTER — Other Ambulatory Visit: Payer: Self-pay | Admitting: Cardiology

## 2022-12-24 DIAGNOSIS — I4891 Unspecified atrial fibrillation: Secondary | ICD-10-CM

## 2023-03-08 DIAGNOSIS — H353212 Exudative age-related macular degeneration, right eye, with inactive choroidal neovascularization: Secondary | ICD-10-CM | POA: Diagnosis not present

## 2023-05-18 DIAGNOSIS — Z125 Encounter for screening for malignant neoplasm of prostate: Secondary | ICD-10-CM | POA: Diagnosis not present

## 2023-05-18 DIAGNOSIS — E785 Hyperlipidemia, unspecified: Secondary | ICD-10-CM | POA: Diagnosis not present

## 2023-05-18 DIAGNOSIS — G629 Polyneuropathy, unspecified: Secondary | ICD-10-CM | POA: Diagnosis not present

## 2023-05-18 DIAGNOSIS — I272 Pulmonary hypertension, unspecified: Secondary | ICD-10-CM | POA: Diagnosis not present

## 2023-05-18 DIAGNOSIS — Z0189 Encounter for other specified special examinations: Secondary | ICD-10-CM | POA: Diagnosis not present

## 2023-05-18 DIAGNOSIS — Z0000 Encounter for general adult medical examination without abnormal findings: Secondary | ICD-10-CM | POA: Diagnosis not present

## 2023-05-18 DIAGNOSIS — E7800 Pure hypercholesterolemia, unspecified: Secondary | ICD-10-CM | POA: Diagnosis not present

## 2023-05-25 DIAGNOSIS — I480 Paroxysmal atrial fibrillation: Secondary | ICD-10-CM | POA: Diagnosis not present

## 2023-05-25 DIAGNOSIS — Z1331 Encounter for screening for depression: Secondary | ICD-10-CM | POA: Diagnosis not present

## 2023-05-25 DIAGNOSIS — Z0000 Encounter for general adult medical examination without abnormal findings: Secondary | ICD-10-CM | POA: Diagnosis not present

## 2023-05-25 DIAGNOSIS — R82998 Other abnormal findings in urine: Secondary | ICD-10-CM | POA: Diagnosis not present

## 2023-05-25 DIAGNOSIS — D6869 Other thrombophilia: Secondary | ICD-10-CM | POA: Diagnosis not present

## 2023-05-25 DIAGNOSIS — I341 Nonrheumatic mitral (valve) prolapse: Secondary | ICD-10-CM | POA: Diagnosis not present

## 2023-05-25 DIAGNOSIS — E785 Hyperlipidemia, unspecified: Secondary | ICD-10-CM | POA: Diagnosis not present

## 2023-05-25 DIAGNOSIS — N401 Enlarged prostate with lower urinary tract symptoms: Secondary | ICD-10-CM | POA: Diagnosis not present

## 2023-05-25 DIAGNOSIS — M459 Ankylosing spondylitis of unspecified sites in spine: Secondary | ICD-10-CM | POA: Diagnosis not present

## 2023-05-25 DIAGNOSIS — M1991 Primary osteoarthritis, unspecified site: Secondary | ICD-10-CM | POA: Diagnosis not present

## 2023-05-25 DIAGNOSIS — Z7901 Long term (current) use of anticoagulants: Secondary | ICD-10-CM | POA: Diagnosis not present

## 2023-05-25 DIAGNOSIS — R2689 Other abnormalities of gait and mobility: Secondary | ICD-10-CM | POA: Diagnosis not present

## 2023-05-25 DIAGNOSIS — G629 Polyneuropathy, unspecified: Secondary | ICD-10-CM | POA: Diagnosis not present

## 2023-05-30 ENCOUNTER — Other Ambulatory Visit: Payer: Self-pay | Admitting: Cardiology

## 2023-05-30 ENCOUNTER — Other Ambulatory Visit: Payer: Self-pay | Admitting: Sports Medicine

## 2023-05-30 DIAGNOSIS — I480 Paroxysmal atrial fibrillation: Secondary | ICD-10-CM

## 2023-05-31 NOTE — Telephone Encounter (Signed)
Prescription refill request for Eliquis received. Indication:afib Last office visit:5/24 Scr:0.91  10/24 Age: 82 Weight:95.9  kg  Prescription refilled

## 2023-06-04 ENCOUNTER — Other Ambulatory Visit: Payer: Self-pay

## 2023-06-04 MED ORDER — GABAPENTIN 300 MG PO CAPS: 300.0000 mg | ORAL_CAPSULE | Freq: Four times a day (QID) | ORAL | 0 refills | Status: DC

## 2023-06-07 ENCOUNTER — Other Ambulatory Visit: Payer: Self-pay

## 2023-06-07 MED ORDER — GABAPENTIN 300 MG PO CAPS: 300.0000 mg | ORAL_CAPSULE | Freq: Four times a day (QID) | ORAL | 0 refills | Status: DC

## 2023-06-08 DIAGNOSIS — Z79899 Other long term (current) drug therapy: Secondary | ICD-10-CM | POA: Diagnosis not present

## 2023-06-08 DIAGNOSIS — Z8679 Personal history of other diseases of the circulatory system: Secondary | ICD-10-CM | POA: Diagnosis not present

## 2023-06-08 DIAGNOSIS — Z7901 Long term (current) use of anticoagulants: Secondary | ICD-10-CM | POA: Diagnosis not present

## 2023-06-08 DIAGNOSIS — I4819 Other persistent atrial fibrillation: Secondary | ICD-10-CM | POA: Diagnosis not present

## 2023-06-08 DIAGNOSIS — Z5181 Encounter for therapeutic drug level monitoring: Secondary | ICD-10-CM | POA: Diagnosis not present

## 2023-06-08 DIAGNOSIS — Z9889 Other specified postprocedural states: Secondary | ICD-10-CM | POA: Diagnosis not present

## 2023-06-15 ENCOUNTER — Telehealth: Payer: Self-pay | Admitting: Internal Medicine

## 2023-06-15 DIAGNOSIS — G4733 Obstructive sleep apnea (adult) (pediatric): Secondary | ICD-10-CM

## 2023-06-15 NOTE — Telephone Encounter (Signed)
Patient states skin is breaking out around CPAP mask. Patient phone number is [phone].

## 2023-06-16 DIAGNOSIS — L218 Other seborrheic dermatitis: Secondary | ICD-10-CM | POA: Diagnosis not present

## 2023-06-16 DIAGNOSIS — D485 Neoplasm of uncertain behavior of skin: Secondary | ICD-10-CM | POA: Diagnosis not present

## 2023-06-16 DIAGNOSIS — Z85828 Personal history of other malignant neoplasm of skin: Secondary | ICD-10-CM | POA: Diagnosis not present

## 2023-06-16 DIAGNOSIS — L820 Inflamed seborrheic keratosis: Secondary | ICD-10-CM | POA: Diagnosis not present

## 2023-06-16 DIAGNOSIS — L821 Other seborrheic keratosis: Secondary | ICD-10-CM | POA: Diagnosis not present

## 2023-06-16 DIAGNOSIS — L720 Epidermal cyst: Secondary | ICD-10-CM | POA: Diagnosis not present

## 2023-06-16 NOTE — Telephone Encounter (Signed)
He has been using CPAP a long time, so question is whether face breaking out is a new problem, maybe after change from previously tolerated mask.  He can talk with DME about trying different mask, maybe nasal pillows, with less skin contact.

## 2023-06-16 NOTE — Telephone Encounter (Signed)
Called the pt and there was no answer- LMTCB   In the meatime will route to Dr Maple Hudson to ask if there is anything he can suggest for face breakout from CPAP mask

## 2023-06-18 NOTE — Telephone Encounter (Signed)
Called the pt and there was no answer- LMTCB    

## 2023-06-18 NOTE — Telephone Encounter (Signed)
I called and spoke with the pt and notified of response per Dr Maple Hudson  He verbalized understanding  Nothing further needed  Order to [facility] for nasal pillow mask

## 2023-07-16 DIAGNOSIS — H43813 Vitreous degeneration, bilateral: Secondary | ICD-10-CM | POA: Diagnosis not present

## 2023-07-16 DIAGNOSIS — H353212 Exudative age-related macular degeneration, right eye, with inactive choroidal neovascularization: Secondary | ICD-10-CM | POA: Diagnosis not present

## 2023-07-16 DIAGNOSIS — H26492 Other secondary cataract, left eye: Secondary | ICD-10-CM | POA: Diagnosis not present

## 2023-07-16 DIAGNOSIS — H353122 Nonexudative age-related macular degeneration, left eye, intermediate dry stage: Secondary | ICD-10-CM | POA: Diagnosis not present

## 2023-08-05 ENCOUNTER — Telehealth: Payer: Self-pay | Admitting: Cardiology

## 2023-08-05 NOTE — Telephone Encounter (Signed)
 Called patient with no answer. Left message to return call.

## 2023-08-05 NOTE — Telephone Encounter (Signed)
 Patient identification verified by 2 forms. Bertina Cooks, RN    Called and spoke to patient  Patient states:   -woke up this morning with high heart rate, 126BPM   -apple watch noted that while sleeping heart rate began to increase at 1am   -according to apple watch HR rate was 56-140 while sleeping   -has history of a-fib with 2 ablations   -heart rate has normalized to 60BPM   -Takes metoprolol  25mg  daily and eliquis  5mg  twice daily   -would like OV for EKG to check if back in a A-fib   -has decreased alcohol  use   -EP cardiologist located in [facility], last appointment 06/08/23  -first episode of palpitation recently  Patient denies:   -Palpitations   -SOB/difficulty breathing   -chest pain/pressure  Informed patient message sent to Dr. Pietro for input/advisement  Reviewed ED warning signs/precautions  Patient verbalized understanding, no questions at this time

## 2023-08-05 NOTE — Telephone Encounter (Signed)
 Pt returning nurse's call. Please advise

## 2023-08-05 NOTE — Telephone Encounter (Signed)
 Spoke with pt, his watch will do an ECG and it did show a fib during the night when heart rate was elevated. He did ECG while on the phone with me and he was in sinus. He did request an appt to discuss a fib. Follow up scheduled with NP.

## 2023-08-05 NOTE — Telephone Encounter (Signed)
 STAT if HR is under 50 or over 120 (normal HR is 60-100 beats per minute)  What is your heart rate? 62  Do you have a log of your heart rate readings (document readings)? Went up to 140 this morning   Do you have any other symptoms? Fatigue when having the symptoms. States his watch was telling him that he was having an afib

## 2023-08-06 ENCOUNTER — Ambulatory Visit: Payer: Medicare Other | Admitting: Nurse Practitioner

## 2023-08-09 ENCOUNTER — Encounter: Payer: Self-pay | Admitting: Cardiology

## 2023-08-10 ENCOUNTER — Ambulatory Visit: Payer: Medicare Other | Admitting: Nurse Practitioner

## 2023-08-12 ENCOUNTER — Ambulatory Visit: Payer: Medicare Other | Admitting: Sports Medicine

## 2023-08-12 VITALS — BP 118/72 | Ht 76.0 in | Wt 208.0 lb

## 2023-08-12 DIAGNOSIS — G603 Idiopathic progressive neuropathy: Secondary | ICD-10-CM | POA: Diagnosis not present

## 2023-08-12 DIAGNOSIS — R600 Localized edema: Secondary | ICD-10-CM | POA: Diagnosis not present

## 2023-08-12 MED ORDER — GABAPENTIN 300 MG PO CAPS: 300.0000 mg | ORAL_CAPSULE | Freq: Four times a day (QID) | ORAL | 2 refills | Status: DC

## 2023-08-12 NOTE — Assessment & Plan Note (Signed)
Stable, chronic issue for him fairly well controlled on Gabapentin 400mg  QID. Does affect his gait, however functional testing is pretty good. He does physical therapy for this. Review of most recent BMP from 02/2022 shows stable renal function. Due for Gabapentin refill.  Plan: -3 months of Gabapentin refills provided -Encouraged continuation of functional strength and rehab exercises.

## 2023-08-12 NOTE — Patient Instructions (Addendum)
We discussed your leg swelling. This is a venous insufficiency issue secondary to your history of varicose veins. We discussed getting a few pairs of compression socks or wrangler boot socks which can also help with providing compression. Also try to keep your feet elevated above the level of your heart while sleeping. Lastly, do the calf exercises (calf raises off a step and altered leg raises against a counter surface 3 sets of 15 reps daily) which will help encourage venous blood return.  We refilled your gabapentin today.  Follow-up as needed.

## 2023-08-12 NOTE — Progress Notes (Signed)
   PCP: Creola Corn, MD  SUBJECTIVE:   HPI:  Patient is a 83 y.o. male here with chief complaint of bilateral lower extremity swelling and requesting refill of Gabapentin. Ongoing for 6 weeks. He is fairly active trying to walk 3-4 days/week. 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 He denies any pain in his legs/calves. Denies chest pain/pressure, abdominal pain/swelling, shortness of breath, or decreased exercise tolerance. He does report an episode a couple weeks ago in the morning when he felt "off" and checked his apple watch which reported a heart rate of 128 and stated he was in afib. He took a rhythm report shortly after this and his watch said he was back in sinus. He has no other complaints.   Pertinent ROS were reviewed with the patient and found to be negative unless otherwise specified above in HPI.   PERTINENT  PMH / PSH / FH / SH:  Past Medical, Surgical, Social, and Family History Reviewed & Updated in the EMR.  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 No calf tenderness to palpation. Legs fairly symmetric.  Observation shows significant varicosity change. PSY: normal mood, congruent affect  Toe walk shows that he has significant weakness bilaterally. 30 Second Chair Stand Test: 10 get-ups Get-up and Go: 11 seconds 1 foot balance is poor Assessment & Plan Idiopathic progressive neuropathy Stable,  chronic issue for him fairly well controlled on Gabapentin 400mg  QID. Does affect his gait, however functional testing is pretty good. He does physical therapy for this. Review of most recent BMP from 02/2022 shows stable renal function. Due for Gabapentin refill.  Plan: -3 months of Gabapentin refills provided -Encouraged continuation of functional strength and rehab exercises. Bilateral lower extremity edema No concerning symptoms in HPI and fairly benign exam. Most recent Echocardiogram from 02/2022 with normal EF 60-65% and moderate tricuspid regurgitation, stable from previous echos. He is on Eliquis, very low suspicion for DVT. Suspect this is venous insufficiency, esp given his hx of varicose veins.  Plan: -Advised use of compression stockings and leg elevation when sleeping -Provided calf exercises to encourage improved venous return   Wesley Salen, MD PGY-4, Sports Medicine Fellow [facility]  I observed and examined the patient with the [facility] resident and agree with assessment and plan.  Note reviewed and modified by me. Sterling Big, MD

## 2023-08-13 ENCOUNTER — Encounter: Payer: Self-pay | Admitting: Physician Assistant

## 2023-08-13 ENCOUNTER — Ambulatory Visit: Payer: Medicare Other | Attending: Physician Assistant | Admitting: Emergency Medicine

## 2023-08-13 VITALS — BP 116/78 | HR 60 | Ht 76.0 in | Wt 217.4 lb

## 2023-08-13 DIAGNOSIS — I951 Orthostatic hypotension: Secondary | ICD-10-CM | POA: Diagnosis not present

## 2023-08-13 DIAGNOSIS — I361 Nonrheumatic tricuspid (valve) insufficiency: Secondary | ICD-10-CM

## 2023-08-13 DIAGNOSIS — I480 Paroxysmal atrial fibrillation: Secondary | ICD-10-CM

## 2023-08-13 DIAGNOSIS — R002 Palpitations: Secondary | ICD-10-CM | POA: Diagnosis not present

## 2023-08-13 DIAGNOSIS — G4733 Obstructive sleep apnea (adult) (pediatric): Secondary | ICD-10-CM | POA: Diagnosis not present

## 2023-08-13 NOTE — Progress Notes (Addendum)
Cardiology Office Note:    Date:  08/13/2023  ID:  Vance Peper., DOB 01-16-1941, MRN [id number] PCP: Creola Corn, MD  [facility] Providers Cardiologist:  Olga Millers, MD       Patient Profile:      Wesley [name], [place]. is a 83 year old male with visit pertinent history of atrial fibrillation s/p ablation in July 2011 in [facility] [place], atrial tachycardia status post successful cardioversion in May 2021 and repeat ablation at [facility] in July 2021.  Nuclear study March 2006 was normal.  He had an atrial fibrillation ablation on 02/19/2010 and trials in [place].  Patient does continue to be followed there.  Abdominal ultrasound March while standing showed no aneurysm.  Echocardiogram June 2021 showed normal LV function, grade 2 DD, mild to moderate tricuspid regurgitation.  He underwent successful cardioversion for A-fib in May 2021 and had repeat ablation at medical [facility] [place] in July 2021.  Seen in clinic in August 2023 with complaints of subtle arrhythmias for a month.  Her Kardia device picked up atrial fibrillation with heart rate of 144 however EKG in office showed NSR heart rate of 72.  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  2-week ZIO was ordered and showed underlying sinus rhythm with average heart rate of 68, 6573 SVT runs longest lasting 21.6 seconds with average heart rate of 139 bpm, less than 1% A-fib burden with average heart rate 111 bpm with longest lasting 12 minutes, 13% isolated PACs.  Last seen in clinic on 05/06/2022 by Victorino Dike, PA.  She had noted 1-2 episodes where she fell out of rhythm at the time.  Without significant  palpitations noted medication changes were held and recommended appointment with Dr. Delena Serve of [facility] to review event monitor.  He was seen by Dr. Delena Serve at [facility] on 06/02/2022 and started on dronedarone 100 mg twice daily.  Patient had called into office on 08/05/2023 reporting that his Apple Watch noted heart rates up to 140 bpm and an episode of atrial fibrillation.      History of Present Illness:  Discussed the use of AI scribe software for clinical note transcription with the patient, who gave verbal consent to proceed.  Wesley R Elisio Arko. is a 83 y.o. male who returns for atrial fibrillation.   Today he presents with concerns about a recent episode of AFib detected by his Apple Watch. The patient has been under the care of an electrophysiologist in [place].The patient's AFib has been managed with Multaq, Eliquis, and Toprolol. He reports that the recent episode of AFib was similar to previous episodes, with an accelerated heart rate but no other symptoms such as chest pain, syncope, or shortness of breath. He also reports that the episode self-resolved, lasted few seconds, and was similar to previous episodes. He has not had further episodes over the past year. He's has been monitoring their heart rate and rhythm with an Apple Watch and Kardia mobile device, which has not detected any other episodes of AFib.     Review of Systems  Constitutional: Negative for weight gain and weight loss.  Cardiovascular:  Negative for chest pain, claudication, dyspnea on exertion, irregular heartbeat, leg swelling, near-syncope, orthopnea, palpitations,  paroxysmal nocturnal dyspnea and syncope.  Respiratory:  Negative for cough, hemoptysis and shortness of breath.   Gastrointestinal:  Negative for abdominal pain, hematochezia and melena.  Genitourinary:  Negative for hematuria.  Neurological:  Negative for dizziness and light-headedness.     See HPI     Home Medications:    Prior to Admission  medications   Medication Sig Start Date End Date Taking? Authorizing Provider  acidophilus (RISAQUAD) CAPS capsule Take 1 capsule by mouth daily.    [provider]  Alpha-Lipoic Acid 600 MG CAPS Take 600 mg by mouth daily.    [provider]  Ascorbic Acid (VITAMIN C) 1000 MG tablet Take 1,000 mg by mouth daily.    [provider]  b complex vitamins capsule Take 1 capsule by mouth daily.    [provider]  Cyanocobalamin 5000 MCG SUBL Place 1 tablet under the tongue daily.     [provider]  dronedarone (MULTAQ) 400 MG tablet Take 400 mg by mouth 2 (two) times daily with a meal.    [provider]  ELIQUIS 5 MG TABS tablet TAKE 1 TABLET TWICE A DAY 05/31/23   Lewayne Bunting, MD  gabapentin (NEURONTIN) 300 MG capsule Take 1 capsule (300 mg total) by mouth 4 (four) times daily. 08/12/23   Marisa Cyphers, MD  metoprolol succinate (TOPROL-XL) 25 MG 24 hr tablet TAKE 1 TABLET DAILY 12/24/22   Lewayne Bunting, MD  Multiple Vitamin (MULTIVITAMIN) tablet Take 1 tablet by mouth daily.    [provider]  Multiple Vitamins-Minerals (PRESERVISION AREDS 2+MULTI VIT) CAPS Take 1 tablet by mouth daily.  01/11/19   [provider]  NEXIUM 40 MG capsule TAKE 1 CAPSULE DAILY Patient taking differently: Take 40 mg by mouth daily. 02/08/15   Cassell Clement, MD  NON FORMULARY Take 2 capsules by mouth daily. MITOQ 03/01/19   [provider]  pyridOXINE (VITAMIN B-6) 50 MG tablet Take 50 mg by mouth 2 (two) times daily.    [provider]  R/osuvastatin (CRESTOR) 5 MG tablet Take 5 mg by mouth daily. Taking 1/2 tab every other day    [provider]  Turmeric (QC TUMERIC COMPLEX) 500 MG CAPS Take 500 mg by mouth daily. 2 Capsules Daily    [provider]   Studies Reviewed:   EKG Interpretation Date/Time:  Friday August 13 2023 14:00:38 EST Ventricular Rate:  60 PR Interval:  250 QRS  Duration:  128 QT Interval:  438 QTC Calculation: 438 R Axis:   -60  Text Interpretation: Sinus rhythm with 1st degree A-V block Left axis deviation Confirmed by Rise Paganini [phone]) on 08/13/2023 2:09:05 PM    ZIO 03/04/2022 Patient had a min HR of 44 bpm, max HR of 179 bpm, and avg HR of 68 bpm. Predominant underlying rhythm was Sinus Rhythm. First Degree AV Block was present. Intermittent Bundle Branch Block was present. 1 run of Ventricular Tachycardia occurred lasting 5  beats with a max rate of 169 bpm (avg 155 bpm). 6573 Supraventricular Tachycardia runs occurred, the run with the fastest interval lasting 4 beats with a max rate of 179 bpm, the longest lasting 21.6 secs with an avg rate of 139 bpm. Atrial Fibrillation  occurred (<1% burden), ranging from 83-139 bpm (avg of 111 bpm), the longest lasting 11 mins 56 secs with an avg rate of 111 bpm. Isolated SVEs were frequent (13.0%, 185715), SVE Couplets were occasional (2.7%, 19573), and SVE Triplets were  occasional  (3.7%, 17351). Isolated VEs were occasional (3.3%, 47430), VE Couplets were rare (<1.0%, 4111), and VE Triplets were rare (<1.0%, 57)  Echocardiogram 03/26/2022 1. Left ventricular ejection fraction, by estimation, is 60 to 65%. Left  ventricular ejection fraction by PLAX is 62 %. The left ventricle has  normal function. The left ventricle has no regional wall motion  abnormalities. Left ventricular diastolic  parameters were normal.   2. Right ventricular systolic function is normal. The right ventricular  size is normal. There is mildly elevated pulmonary artery systolic  pressure. The estimated right ventricular systolic pressure is 38.8 mmHg.   3. Right atrial size was severely dilated.   4. The mitral valve is myxomatous. Trivial mitral valve regurgitation.  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   5. 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    6. The aortic valve is tricuspid. Aortic valve regurgitation is not  visualized.   7. Aortic dilatation noted. There is borderline dilatation of the aortic  root, measuring 39 mm. There is borderline dilatation of the ascending  aorta, measuring 38 mm.   8. The inferior vena cava is normal in size with greater than 50%  respiratory variability, suggesting right atrial pressure of 3 mmHg.  Risk Assessment/Calculations:    CHA2DS2-VASc Score = 2   This indicates a 2.2% annual risk of stroke. The patient's score is based upon: CHF History: 0 HTN History: 0 Diabetes History: 0 Stroke History: 0 Vascular Disease History: 0 Age Score: 2 Gender Score: 0            Physical Exam:   VS:  BP 116/78   Pulse 60   Ht 6\' 4"  (1.93 m)   Wt 217 lb 6.4 oz (98.6 kg)   SpO2 94%   BMI 26.46 kg/m    Wt Readings from Last 3 Encounters:  08/13/23 217 lb 6.4 oz (98.6 kg)  08/12/23 208 lb (94.3 kg)  11/12/22 211 lb 6.4 oz (95.9 kg)    Constitutional:      Appearance: Normal and healthy appearance. Not in distress.  Neck:     Vascular: JVD normal.  Pulmonary:     Effort: Pulmonary effort is normal.     Breath sounds: Normal breath sounds.  Chest:     Chest wall: Not tender to palpatation.  Cardiovascular:     PMI at left midclavicular line. Normal rate. Regular rhythm. Normal S1. Normal S2.      Murmurs: There is no murmur.     No gallop.  No click. No rub.  Pulses:    Intact distal pulses.  Edema:    Peripheral edema absent.  Musculoskeletal: Normal range of motion.     Cervical back: Normal range of motion and neck supple. Skin:    General: Skin is warm and dry.  Neurological:     General: No focal deficit present.     Mental Status: Alert, oriented to person, place, and time and oriented to person, place and time.  Psychiatric:        Mood and Affect: Mood and affect normal.        Behavior: Behavior is cooperative.        Thought Content: Thought content normal.         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 The episode self-resolved, lasted few seconds, with HR up to 140bpm, he was asymptomatic. He denied any reoccurrences over past year.  -Reviewed recent labs from PCP office at [facility] which were unremarkable  -Plan to monitor at this time given only one episode -If Afib becomes more consistent he will need to follow-up with his EP in [place] and we can consider an increase his Metoprolol if needed -Continue Metoprolol-XL 25mg  daily and Multaq 400mg  BID -Continue Eliquis 5mg  BID. Does not meet does reduction qualifications, no bleeding concerns.  CHA2DS2-VASc Score = 2 [CHF History: 0, HTN History: 0, Diabetes History: 0, Stroke History: 0, Vascular Disease History: 0, Age Score: 2, Gender Score: 0].  Therefore, the patient's annual risk of stroke is 2.2 %.       OSA -Adherent to CPAP  Tricuspid regurgitation  Noted to be moderate on Echo 02/2022 -Consider repeating on follow-up visit   Orthostatic hypotension  No reoccurrences over the past year and not on blood pressure lowering medications -Continue to monitor             Dispo:  Return in about 6 months (around 02/10/2024).  Signed, Denyce Robert, NP

## 2023-08-13 NOTE — Patient Instructions (Signed)
Medication Instructions:  NO CHANGES *If you need a refill on your cardiac medications before your next appointment, please call your pharmacy*   Lab Work: NO LABS If you have labs (blood work) drawn today and your tests are completely normal, you will receive your results only by: MyChart Message (if you have MyChart) OR A paper copy in the mail If you have any lab test that is abnormal or we need to change your treatment, we will call you to review the results.   Testing/Procedures: NO TESTING   Follow-Up: At [facility], you and your health needs are our priority.  As part of our continuing mission to provide you with exceptional heart care, we have created designated Provider Care Teams.  These Care Teams include your primary Cardiologist (physician) and Advanced Practice Providers (APPs -  Physician Assistants and Nurse Practitioners) who all work together to provide you with the care you need, when you need it.   Your next appointment:   6 month(s)  Provider:   Olga Millers, MD  or Rise Paganini, DNP   Other Instructions

## 2023-09-22 ENCOUNTER — Ambulatory Visit: Payer: Medicare Other | Admitting: Podiatry

## 2023-09-23 ENCOUNTER — Encounter: Payer: Self-pay | Admitting: Podiatry

## 2023-09-23 ENCOUNTER — Ambulatory Visit (INDEPENDENT_AMBULATORY_CARE_PROVIDER_SITE_OTHER): Payer: Medicare Other | Admitting: Podiatry

## 2023-09-23 VITALS — Ht 76.0 in | Wt 217.4 lb

## 2023-09-23 DIAGNOSIS — L600 Ingrowing nail: Secondary | ICD-10-CM

## 2023-09-23 NOTE — Patient Instructions (Signed)

## 2023-09-23 NOTE — Progress Notes (Signed)
 Subjective:   Patient ID: Wesley Peper., male   DOB: 83 y.o.   MRN: [id number]   HPI Patient states he has developed a painful ingrown toenail on his left big toe with the right doing well after surgery   ROS      Objective:  Physical Exam  Neurovascular status intact with incurvated medial border left big toe painful when pressed no erythema edema drainage associated with this     Assessment:  Ingrown toenail deformity left hallux medial border with pain     Plan:  H&P reviewed discussed condition recommended correction patient wants this done and I went ahead today did sterile prep and injected the left big toe 60 minutes like Marcaine mixture patient read signed consent form understanding risk and I then went ahead using sterile instrumentation I removed the medial border exposed matrix applied phenol 3 applications 30 seconds followed by alcohol lavage sterile dressing gave instructions on soaks wear dressing 24 hours take it off earlier if throbbing were to occur and encouraged to call with questions concerns which may arise

## 2023-09-26 ENCOUNTER — Encounter: Payer: Self-pay | Admitting: Podiatry

## 2023-10-19 DIAGNOSIS — L570 Actinic keratosis: Secondary | ICD-10-CM | POA: Diagnosis not present

## 2023-10-19 DIAGNOSIS — Z85828 Personal history of other malignant neoplasm of skin: Secondary | ICD-10-CM | POA: Diagnosis not present

## 2023-10-19 DIAGNOSIS — D225 Melanocytic nevi of trunk: Secondary | ICD-10-CM | POA: Diagnosis not present

## 2023-10-19 DIAGNOSIS — L565 Disseminated superficial actinic porokeratosis (DSAP): Secondary | ICD-10-CM | POA: Diagnosis not present

## 2023-10-19 DIAGNOSIS — D485 Neoplasm of uncertain behavior of skin: Secondary | ICD-10-CM | POA: Diagnosis not present

## 2023-10-19 DIAGNOSIS — L820 Inflamed seborrheic keratosis: Secondary | ICD-10-CM | POA: Diagnosis not present

## 2023-10-19 DIAGNOSIS — L821 Other seborrheic keratosis: Secondary | ICD-10-CM | POA: Diagnosis not present

## 2023-10-19 DIAGNOSIS — L905 Scar conditions and fibrosis of skin: Secondary | ICD-10-CM | POA: Diagnosis not present

## 2023-10-20 DIAGNOSIS — I4819 Other persistent atrial fibrillation: Secondary | ICD-10-CM | POA: Diagnosis not present

## 2023-10-21 ENCOUNTER — Telehealth: Payer: Self-pay | Admitting: Internal Medicine

## 2023-10-21 DIAGNOSIS — I4819 Other persistent atrial fibrillation: Secondary | ICD-10-CM | POA: Diagnosis not present

## 2023-10-21 NOTE — Telephone Encounter (Signed)
 CMN Received from [facility] for new pap order on 10/21/23

## 2023-10-27 ENCOUNTER — Ambulatory Visit: Payer: Self-pay | Admitting: Internal Medicine

## 2023-10-27 NOTE — Telephone Encounter (Signed)
 This RN contacted [employer] and they requested to call back between 8 AM and 5 PM tomorrow. Will route to office.   Copied from CRM [id number]. Topic: Clinical - Request for Lab/Test Order >> Oct 27, 2023 12:41 PM Para March B wrote: Reason for CRM: [facility] and you are requesting Cpap. Please call [phone] ext [phone].

## 2023-10-28 NOTE — Telephone Encounter (Signed)
 Called Brad from [employer] will reach out to me when he knows more information

## 2023-10-29 DIAGNOSIS — M450 Ankylosing spondylitis of multiple sites in spine: Secondary | ICD-10-CM | POA: Diagnosis not present

## 2023-10-29 DIAGNOSIS — Z6826 Body mass index (BMI) 26.0-26.9, adult: Secondary | ICD-10-CM | POA: Diagnosis not present

## 2023-10-29 DIAGNOSIS — M1991 Primary osteoarthritis, unspecified site: Secondary | ICD-10-CM | POA: Diagnosis not present

## 2023-10-29 DIAGNOSIS — M5136 Other intervertebral disc degeneration, lumbar region with discogenic back pain only: Secondary | ICD-10-CM | POA: Diagnosis not present

## 2023-10-29 DIAGNOSIS — E663 Overweight: Secondary | ICD-10-CM | POA: Diagnosis not present

## 2023-11-02 DIAGNOSIS — Z85828 Personal history of other malignant neoplasm of skin: Secondary | ICD-10-CM | POA: Diagnosis not present

## 2023-11-02 DIAGNOSIS — L988 Other specified disorders of the skin and subcutaneous tissue: Secondary | ICD-10-CM | POA: Diagnosis not present

## 2023-11-02 DIAGNOSIS — D485 Neoplasm of uncertain behavior of skin: Secondary | ICD-10-CM | POA: Diagnosis not present

## 2023-11-15 NOTE — Progress Notes (Signed)
 HPI  male former smoker followed for OSA, complicated by A. fib/ablation/Eliquis , peripheral neuropathy NPSG 10/24/09-AHI 21.8/hour, desat to 86%, body weight 230 pounds HST-07/06/2018-AHI 17.5/hour, desaturation to 82%, body weight 216 pound  -----------------------------------------------------------------------------------------------  11/10/21- 83 yo male former smoker followed for OSA, complicated by A. fib/ablation/Eliquis , peripheral neuropathy, macular degeneration, Ankylosing Spondylitis (remote dx, inactive) CPAP auto 8-15/ [facility]    Download compliance 100%, AHI 6.2/ hr Body weight today- Covid vax-5 Phizer Flu vax-had -----Patient was told he has "muscle fatigue" when he went to the ENT and was told it was mostly age related. Patient is doing good otherwise. Download reviewed. Comfortable and satisfied for now. We would like fewer breakthrough apneas- will watch this but it doesn't look like a pressure setting issue. Discussed CPAP v Inspire option and can refer if he decides he wants. He has seen Dr Melissa Spring before.  CXR 05/03/19- IMPRESSION: No active cardiopulmonary disease.  11/12/22-  83 yo male former smoker followed for OSA, complicated by A. fib/ablation/Eliquis , peripheral neuropathy, macular degeneration, Ankylosing Spondylitis (remote dx, inactive), GERD,  CPAP auto 8-15/ [facility]    AirSense 10 AutoSet Download compliance 100%, AHI 7.4/ hr   mostly centrals Body weight today-211 lbs -----Pt is doing okay  Download reviewed.  Note he is having a few central apneas, clinically insignificant but discussed. He continues to struggle with chronic back discomfort, not looking for spine surgery anytime soon. He is planning trip to [place] and we discussed options of not using the humidifier for convenience.  He could substitute nasal saline gel.  11/16/23-  83 yo male former smoker followed for OSA, complicated by A. fib/ablation/Eliquis , peripheral neuropathy, macular  degeneration, Ankylosing Spondylitis (remote dx, inactive), GERD,  CPAP auto 8-15/ [facility]    AirSense 10 AutoSet Download compliance 97%, AHI 6.4/hr Body weight today 211 lbs Discussed the use of AI scribe software for clinical note transcription with the patient, who gave verbal consent to proceed.  History of Present Illness   The patient, with a history of sleep apnea managed with CPAP, presents with intermittent episodes of nausea over the past year. He describes the sensation as 'feeling like I'm going to throw up,' with increased salivation and coughing. The episodes occur approximately three to four times a year and resolve spontaneously. The patient has not previously discussed these symptoms with his primary care provider. He denies any associated breathing difficulties, such as shortness of breath, coughing, or wheezing.   CPAP download review shows excellent compliance with medically unimportant residual centrals and hypopneas.     Assessment and Plan:    Obstructive Sleep Apnea Obstructive sleep apnea well-managed with benefit from CPAP. Current settings effective. - Continue current CPAP setting 8-15.  Central Sleep Apnea Central sleep apnea present with 0.9 events/hour, not clinically significant. Total AHI 6.4, central events not concerning. - Continue monitoring central apnea events. - Reassess if central apneas increase or symptoms develop.     ROS-see HPI   + = positive Constitutional:    weight loss, night sweats, fevers, chills, fatigue, lassitude. HEENT:    headaches, difficulty swallowing, tooth/dental problems, sore throat,       sneezing, itching, ear ache, nasal congestion, post nasal drip, snoring CV:    chest pain, orthopnea, PND, swelling in lower extremities, anasarca,                                   dizziness, palpitations Resp:  shortness of breath with exertion or at rest.                productive cough,   non-productive cough, coughing up of blood.               change in color of mucus.  wheezing.   Skin:    rash or lesions. GI:  No-   heartburn, indigestion, abdominal pain, nausea, vomiting, diarrhea,                 change in bowel habits, loss of appetite GU: dysuria, change in color of urine, no urgency or frequency.   flank pain. MS:   +joint pain, stiffness, decreased range of motion, back pain. Neuro-     nothing unusual Psych:  change in mood or affect.  depression or anxiety.   memory loss.  OBJ- Physical Exam General- Alert, Oriented, Affect-appropriate, Distress- none acute, +tall/lean,  Skin- rash-none, lesions- none, excoriation- none Lymphadenopathy- none Head- atraumatic            Eyes- Gross vision intact, PERRLA, conjunctivae and secretions clear            Ears- Hearing, canals-normal            Nose- Clear, no-Septal dev, mucus, polyps, erosion, perforation             Throat- Mallampati II-III , mucosa clear , drainage- none, tonsils- atrophic Neck- flexible , trachea midline, no stridor , thyroid  nl, carotid no bruit Chest - symmetrical excursion , unlabored           Heart/CV- RRR , no murmur , no gallop  , no rub, nl s1 s2                           - JVD- none , edema- none, stasis changes- none, varices- none           Lung- clear to P&A, wheeze- none, cough- none , dullness-none, rub- none           Chest wall-  Abd-  Br/ Gen/ Rectal- Not done, not indicated Extrem- cyanosis- none, clubbing, none, atrophy- none, strength- nl Neuro- grossly intact to observation

## 2023-11-16 ENCOUNTER — Encounter: Payer: Self-pay | Admitting: Internal Medicine

## 2023-11-16 ENCOUNTER — Ambulatory Visit (INDEPENDENT_AMBULATORY_CARE_PROVIDER_SITE_OTHER): Payer: Medicare Other | Admitting: Internal Medicine

## 2023-11-16 VITALS — BP 100/68 | HR 63 | Temp 97.7°F | Ht 75.0 in | Wt 211.4 lb

## 2023-11-16 DIAGNOSIS — G4733 Obstructive sleep apnea (adult) (pediatric): Secondary | ICD-10-CM | POA: Diagnosis not present

## 2023-11-16 DIAGNOSIS — Z87891 Personal history of nicotine dependence: Secondary | ICD-10-CM | POA: Diagnosis not present

## 2023-11-16 NOTE — Patient Instructions (Signed)
We can continue CPAP auto 8-15  Please call if we can help 

## 2023-11-29 ENCOUNTER — Telehealth: Payer: Self-pay | Admitting: Primary Care

## 2023-11-29 NOTE — Telephone Encounter (Signed)
    Cmn received from [facility] for new pap order.

## 2023-12-01 ENCOUNTER — Telehealth: Payer: Self-pay | Admitting: Internal Medicine

## 2023-12-01 ENCOUNTER — Encounter: Payer: Self-pay | Admitting: Internal Medicine

## 2023-12-01 DIAGNOSIS — G4733 Obstructive sleep apnea (adult) (pediatric): Secondary | ICD-10-CM

## 2023-12-01 NOTE — Telephone Encounter (Signed)
  Please advise      Copied from CRM [id number]. Topic: Clinical - Order For Equipment >> Dec 01, 2023 10:28 AM Juliana Ocean wrote: Reason for CRM: Adriana Hopping w/ [employer] [facility] calling to ask for a script for a new CPAP machine for the pt, including supplies and heated humidifier.  Needs numerical pressure settings as well. Fax: [phone]   Cb [phone] ext [phone]  (with any questions/concerns)

## 2023-12-01 NOTE — Telephone Encounter (Signed)
 I have ordered replacement CPAP through [facility]

## 2023-12-01 NOTE — Telephone Encounter (Signed)
 Copied from CRM [id number]. Topic: Clinical - Order For Equipment >> Dec 01, 2023 10:28 AM Juliana Ocean wrote: Reason for CRM: Adriana Hopping w/ [facility] calling to ask for a script for a new CPAP machine for the pt, including supplies and heated humidifier.  Needs numerical pressure settings as well. Fax: [phone]  Cb [phone] ext [phone]  (with any questions/concerns)

## 2023-12-09 ENCOUNTER — Encounter: Payer: Self-pay | Admitting: Internal Medicine

## 2023-12-16 ENCOUNTER — Other Ambulatory Visit: Payer: Self-pay

## 2023-12-16 ENCOUNTER — Other Ambulatory Visit: Payer: Self-pay | Admitting: Cardiology

## 2023-12-16 DIAGNOSIS — I4891 Unspecified atrial fibrillation: Secondary | ICD-10-CM

## 2023-12-16 MED ORDER — GABAPENTIN 300 MG PO CAPS: 300.0000 mg | ORAL_CAPSULE | Freq: Four times a day (QID) | ORAL | 3 refills | Status: AC

## 2023-12-16 NOTE — Progress Notes (Signed)
 Pt called asking for refill on gabapentin . Requests 90 day supply sent to [facility].

## 2023-12-21 ENCOUNTER — Telehealth: Payer: Self-pay | Admitting: Internal Medicine

## 2023-12-21 DIAGNOSIS — G4733 Obstructive sleep apnea (adult) (pediatric): Secondary | ICD-10-CM

## 2023-12-21 NOTE — Telephone Encounter (Signed)
 Copied from CRM [id number]. Topic: Clinical - Order For Equipment >> Dec 21, 2023 12:40 PM Justina Oman C wrote: Reason for CRM: Patient [phone] needs a new machine but does not want to go to [facility], and wants to go to another company. Please advise and call back.

## 2023-12-21 NOTE — Telephone Encounter (Signed)
 Order- patient due for replacement CPAP 8-15, mask of choice, humidifier, supplies, AirView/ card. He requests change from [facility] to a different DME company.

## 2023-12-22 ENCOUNTER — Encounter: Payer: Self-pay | Admitting: Podiatry

## 2023-12-22 ENCOUNTER — Telehealth: Payer: Self-pay | Admitting: Internal Medicine

## 2023-12-22 ENCOUNTER — Ambulatory Visit (INDEPENDENT_AMBULATORY_CARE_PROVIDER_SITE_OTHER): Admitting: Podiatry

## 2023-12-22 VITALS — Ht 75.0 in | Wt 211.0 lb

## 2023-12-22 DIAGNOSIS — L600 Ingrowing nail: Secondary | ICD-10-CM | POA: Diagnosis not present

## 2023-12-22 NOTE — Progress Notes (Signed)
 Subjective:   Patient ID: Wesley Chard., male   DOB: 83 y.o.   MRN: [id number]   HPI Patient presents with irritation around the left big toenail and thinks that it could be the nail or skin   ROS      Objective:  Physical Exam  Neurovascular status intact good healing from previous ingrown toenail left big toe but there is a small area of irritation on the distal medial side     Assessment:  Combination of low-grade ingrown toenail left with skin thickness     Plan:  H&P reviewed at this point organ to treat conservatively I debrided a small amount of nailbed and tissue and discussed patient can get pedicures as needed.  Reappoint to recheck as symptoms indicate may require complete nail removal at 1 point future

## 2023-12-22 NOTE — Telephone Encounter (Signed)
 His last ov with me was 11/16/23- is this not sufficient?

## 2023-12-22 NOTE — Telephone Encounter (Signed)
 Called patient.  Gave all information.  Patient would like to use [facility] if they accept his insurance.  Will put this in comments.  Patient verbalized understanding.  Order for new CPAP placed this encounter.

## 2023-12-22 NOTE — Telephone Encounter (Signed)
 I am checking now with DME company I will let you know as soon as I hear from them.

## 2023-12-22 NOTE — Telephone Encounter (Signed)
 Per Starling Eck at [place] [facility]- From what I can tell this pt has no had a visit to show compliance and benefit. We would need a face 2 face visit showing this per the insurance. Once the pt has a visit we can fill the order.       Please advise

## 2023-12-22 NOTE — Telephone Encounter (Signed)
 It is good to go. NFN

## 2023-12-24 ENCOUNTER — Telehealth: Payer: Self-pay | Admitting: Internal Medicine

## 2023-12-24 NOTE — Telephone Encounter (Signed)
 Cmn received from [facility] for CPAP equipment.

## 2023-12-27 ENCOUNTER — Encounter: Payer: Self-pay | Admitting: Cardiology

## 2023-12-27 NOTE — Telephone Encounter (Signed)
 CMN faxed successfully and signed.

## 2024-01-24 ENCOUNTER — Telehealth: Payer: Self-pay

## 2024-01-24 NOTE — Telephone Encounter (Signed)
 Copied from CRM [id number]. Topic: General - Other >> Jan 24, 2024  2:19 PM Rilla B wrote: Reason for CRM:  Patient has received his new CPAP machine and has questions regarding the settings. Pressure is too hard.  Please call patient.    ----------------------------------------------------------------------- From previous Reason for Contact - Prescription Issue: Reason for CRM:  Spoke with patient regarding prior message. Patient stated he has got a new CPAP machine and it may seem the pressure has changed. Advised patient the pressure on his CPAP has not changed  . Patient stated he might need a new face mask .Advised patient our office can always send a order in for patient for mask fitting.Patient stated he will try his old machine and let our office know . Patient's voice was understanding.  Nothing else further needed.

## 2024-01-25 DIAGNOSIS — H353212 Exudative age-related macular degeneration, right eye, with inactive choroidal neovascularization: Secondary | ICD-10-CM | POA: Diagnosis not present

## 2024-01-25 DIAGNOSIS — H43813 Vitreous degeneration, bilateral: Secondary | ICD-10-CM | POA: Diagnosis not present

## 2024-01-25 DIAGNOSIS — H01026 Squamous blepharitis left eye, unspecified eyelid: Secondary | ICD-10-CM | POA: Diagnosis not present

## 2024-01-25 DIAGNOSIS — H353122 Nonexudative age-related macular degeneration, left eye, intermediate dry stage: Secondary | ICD-10-CM | POA: Diagnosis not present

## 2024-01-25 DIAGNOSIS — H26492 Other secondary cataract, left eye: Secondary | ICD-10-CM | POA: Diagnosis not present

## 2024-01-26 DIAGNOSIS — M51369 Other intervertebral disc degeneration, lumbar region without mention of lumbar back pain or lower extremity pain: Secondary | ICD-10-CM | POA: Diagnosis not present

## 2024-01-26 DIAGNOSIS — G629 Polyneuropathy, unspecified: Secondary | ICD-10-CM | POA: Diagnosis not present

## 2024-01-26 DIAGNOSIS — G4733 Obstructive sleep apnea (adult) (pediatric): Secondary | ICD-10-CM | POA: Diagnosis not present

## 2024-01-26 DIAGNOSIS — E785 Hyperlipidemia, unspecified: Secondary | ICD-10-CM | POA: Diagnosis not present

## 2024-01-26 DIAGNOSIS — Z7901 Long term (current) use of anticoagulants: Secondary | ICD-10-CM | POA: Diagnosis not present

## 2024-01-26 DIAGNOSIS — I4819 Other persistent atrial fibrillation: Secondary | ICD-10-CM | POA: Diagnosis not present

## 2024-01-26 DIAGNOSIS — Z9889 Other specified postprocedural states: Secondary | ICD-10-CM | POA: Diagnosis not present

## 2024-01-26 DIAGNOSIS — M459 Ankylosing spondylitis of unspecified sites in spine: Secondary | ICD-10-CM | POA: Diagnosis not present

## 2024-01-27 ENCOUNTER — Ambulatory Visit: Admitting: Sports Medicine

## 2024-02-08 ENCOUNTER — Encounter: Payer: Self-pay | Admitting: Family Medicine

## 2024-02-08 ENCOUNTER — Ambulatory Visit (INDEPENDENT_AMBULATORY_CARE_PROVIDER_SITE_OTHER): Admitting: Family Medicine

## 2024-02-08 VITALS — BP 112/68 | Ht 75.0 in | Wt 208.0 lb

## 2024-02-08 DIAGNOSIS — G603 Idiopathic progressive neuropathy: Secondary | ICD-10-CM

## 2024-02-08 DIAGNOSIS — M5432 Sciatica, left side: Secondary | ICD-10-CM

## 2024-02-08 DIAGNOSIS — Z7901 Long term (current) use of anticoagulants: Secondary | ICD-10-CM

## 2024-02-08 MED ORDER — PREDNISONE 10 MG PO TABS: ORAL_TABLET | ORAL | 0 refills | Status: DC

## 2024-02-08 NOTE — Assessment & Plan Note (Signed)
 On Eliquis  for history of atrial fibrillation, therefore cannot take oral NSAIDs  Plan: - Rx 6-day prednisone  taper given as noted above

## 2024-02-08 NOTE — Assessment & Plan Note (Signed)
 Chronic issue which may be contributing to gait issues and his current left leg symptoms  Plan: - Should continue his gabapentin  as he is doing - Would benefit from return to physical therapy.  Referral given to work with therapist Wyvonna Pa whom he has worked with previously

## 2024-02-08 NOTE — Progress Notes (Signed)
 DATE OF VISIT: 02/08/2024        Wesley [name]. DOB: Mar 30, 1941 MRN: [id number]  CC:  Lt knee and hip pain  History of present Illness: Wesley [name]. is a 83 y.o. male who presents for Lt knee and hip pain Last seen by us  08/12/23 - seen for idiopathic progressive neuropathy -- given refill gabapentin  and recommended ongoing PT  Today notes about 11 days of posterior leg/thigh pain Pain seems to start in the back of the knee and radiate up towards ths hip Working out regularly - 30-40 mins a night before going to bed -- bands and stretching - did exta cardio workout on 01/28/24 on recumbent bike -- usually only doing twice a week Pain started after using recumbent bike Noted pain when kneeling at church a few days later on Sunday Worse with sitting for long periods of if sitting in a low chair Sleeping on his left side - thinks may be affecting him Can't take NSAIDs due to chronic anticoagulation for Afib with Eliquis  Has been doing piriformis stretching which has been helpful  Medications:  Outpatient Encounter Medications as of 02/08/2024  Medication Sig   predniSONE  (DELTASONE ) 10 MG tablet Use as directed per doctors orders for the next 6 days.   acetaminophen (TYLENOL) 500 MG tablet Take 1,000 mg by mouth 2 (two) times daily.   acidophilus (RISAQUAD) CAPS capsule Take 1 capsule by mouth daily.   Alpha-Lipoic Acid 600 MG CAPS Take 600 mg by mouth daily.   Ascorbic Acid (VITAMIN C) 1000 MG tablet Take 1,000 mg by mouth daily.   b complex vitamins capsule Take 1 capsule by mouth daily.   Cyanocobalamin 5000 MCG SUBL Place 1 tablet under the tongue daily.    dronedarone (MULTAQ) 400 MG tablet Take 400 mg by mouth 2 (two) times daily with a meal.   ELIQUIS  5 MG TABS tablet TAKE 1 TABLET TWICE A DAY   gabapentin  (NEURONTIN ) 300 MG capsule Take 1 capsule (300 mg total) by mouth 4 (four) times daily.   metoprolol  succinate (TOPROL -XL) 25 MG 24 hr tablet TAKE 1 TABLET DAILY    Multiple Vitamin (MULTIVITAMIN) tablet Take 1 tablet by mouth daily.   Multiple Vitamins-Minerals (PRESERVISION AREDS 2+MULTI VIT) CAPS Take 1 tablet by mouth daily.    NEXIUM  40 MG capsule TAKE 1 CAPSULE DAILY (Patient taking differently: Take 40 mg by mouth daily.)   rosuvastatin (CRESTOR) 5 MG tablet Take 5 mg by mouth daily. Taking 1/2 tab every other day   Turmeric (QC TUMERIC COMPLEX) 500 MG CAPS Take 500 mg by mouth daily. 2 Capsules Daily   No facility-administered encounter medications on file as of 02/08/2024.    Allergies: has no known allergies.  Physical Examination: Vitals: BP 112/68   Ht 6' 3 (1.905 m)   Wt 208 lb (94.3 kg)   BMI 26.00 kg/m  GENERAL:  Wesley [name]. is a 83 y.o. male appearing their stated age, alert and oriented x 3, in no apparent distress.  SKIN: no rashes or lesions, skin clean, dry, intact MSK: L-spine: No gross deformity.  No midline or paraspinal tenderness.  Negative straight leg raise bilaterally, but tight hamstring on the left.  Able to toe walk and heel walk. Hip: Left hip with full range of motion without pain.  No tenderness over the anterior or lateral hip.  Mild tenderness in the left buttock over the piriformis.  Negative FABER and FADIR.  Hip strength 5 -/5  throughout.  Right hip with full range of motion without pain or weakness.  Right hip strength 5/5 throughout. Walking without a limp NEURO: sensation intact to light touch, DTR 2/4 Achilles and patella bilaterally VASC: pulses 2+ and symmetric DP/PT bilaterally, no edema   Assessment & Plan Left sided sciatica Acute left-sided posterior thigh pain spanning from the left buttock to the posterior knee, some associated tingling and numbness.  Worse with sitting and using the recumbent bike.  History and exam consistent with sciatica.  Has been improving with piriformis stretching.  Patient unable to take NSAIDs due to chronic anticoagulation  Plan: - Diagnosis and treatment  discussed - Rx 6-day prednisone  taper given.  Explained how to take medication and common side effects - Can continue with gabapentin  which she is already taking - Would benefit from return to PT.  Physical therapy referral given to work with therapist Jen Pa whom he has worked with previously - Gradually return to activity as his symptoms allow - Already has follow-up with Dr. Harvey in 2 weeks, should keep this appointment as scheduled.  Encouraged to reach out sooner if symptoms are worsening or not improving Chronic anticoagulation On Eliquis  for history of atrial fibrillation, therefore cannot take oral NSAIDs  Plan: - Rx 6-day prednisone  taper given as noted above Idiopathic progressive neuropathy Chronic issue which may be contributing to gait issues and his current left leg symptoms  Plan: - Should continue his gabapentin  as he is doing - Would benefit from return to physical therapy.  Referral given to work with therapist Wyvonna Pa whom he has worked with previously   Patient expressed understanding & agreement with above.  Encounter Diagnoses  Name Primary?   Left sided sciatica Yes   Chronic anticoagulation    Idiopathic progressive neuropathy     Orders Placed This Encounter  Procedures   Ambulatory referral to Physical Therapy

## 2024-02-16 ENCOUNTER — Ambulatory Visit: Attending: Cardiovascular Disease | Admitting: Emergency Medicine

## 2024-02-16 ENCOUNTER — Encounter: Payer: Self-pay | Admitting: Emergency Medicine

## 2024-02-16 VITALS — BP 98/54 | HR 75 | Ht 75.0 in | Wt 208.0 lb

## 2024-02-16 DIAGNOSIS — I951 Orthostatic hypotension: Secondary | ICD-10-CM | POA: Insufficient documentation

## 2024-02-16 DIAGNOSIS — G4733 Obstructive sleep apnea (adult) (pediatric): Secondary | ICD-10-CM | POA: Insufficient documentation

## 2024-02-16 DIAGNOSIS — I341 Nonrheumatic mitral (valve) prolapse: Secondary | ICD-10-CM | POA: Diagnosis not present

## 2024-02-16 DIAGNOSIS — I471 Supraventricular tachycardia, unspecified: Secondary | ICD-10-CM | POA: Diagnosis not present

## 2024-02-16 DIAGNOSIS — I4811 Longstanding persistent atrial fibrillation: Secondary | ICD-10-CM | POA: Diagnosis not present

## 2024-02-16 NOTE — Patient Instructions (Signed)
 Medication Instructions:  NO CHANGES   Lab Work: NONE   Testing/Procedures: Your physician has requested that you have an echocardiogram. Echocardiography is a painless test that uses sound waves to create images of your heart. It provides your doctor with information about the size and shape of your heart and how well your heart's chambers and valves are working. This procedure takes approximately one hour. There are no restrictions for this procedure. Please do NOT wear cologne, perfume, aftershave, or lotions (deodorant is allowed). Please arrive 15 minutes prior to your appointment time.  Please note: We ask at that you not bring children with you during ultrasound (echo/ vascular) testing. Due to room size and safety concerns, children are not allowed in the ultrasound rooms during exams. Our front office staff cannot provide observation of children in our lobby area while testing is being conducted. An adult accompanying a patient to their appointment will only be allowed in the ultrasound room at the discretion of the ultrasound technician under special circumstances. We apologize for any inconvenience.   Follow-Up: At [facility], you and your health needs are our priority.  As part of our continuing mission to provide you with exceptional heart care, our providers are all part of one team.  This team includes your primary Cardiologist (physician) and Advanced Practice Providers or APPs (Physician Assistants and Nurse Practitioners) who all work together to provide you with the care you need, when you need it.  Your next appointment:   1 YEAR  Provider:   Redell Shallow, MD OR Lum Louis, DNP   We recommend signing up for the patient portal called MyChart.  Sign up information is provided on this After Visit Summary.  MyChart is used to connect with patients for Virtual Visits (Telemedicine).  Patients are able to view lab/test results, encounter notes, upcoming  appointments, etc.  Non-urgent messages can be sent to your provider as well.   To learn more about what you can do with MyChart, go to [email or web address].

## 2024-02-16 NOTE — Progress Notes (Addendum)
 " Cardiology Office Note:    Date:  02/16/2024  ID:  Matthews JONELLE Abigail Mickey., DOB 1941/04/29, MRN [id number] PCP: [name], MD  [facility] Providers Cardiologist:  Redell Shallow, MD Cardiology APP:  Rana Lum CROME, NP       Patient Profile:       Chief Complaint: 69-month follow-up History of Present Illness:  Rolly R Dwaine Pringle. is a 83 y.o. male with visit-pertinent history of atrial fibrillation s/p ablation in July 2011 in [place] , atrial tachycardia status post successful cardioversion in May 2021 and repeat ablation at [facility] in July 2021.   Nuclear study March 2006 was normal.  He had an atrial fibrillation ablation on 02/19/2010 and trials in [place] .  Patient does continue to be followed there.  Abdominal ultrasound March while standing showed no aneurysm.  Echocardiogram June 2021 showed normal LV function, grade 2 DD, mild to moderate tricuspid regurgitation.  He underwent successful cardioversion for A-fib in May 2021 and had repeat ablation at [facility]  in July 2021.   Seen in clinic in August 2023 with complaints of subtle arrhythmias for a month.  Her Kardia device picked up atrial fibrillation with heart rate of 144 however EKG in office showed NSR heart rate of 72.  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  2-week ZIO was ordered and showed underlying sinus rhythm with average heart rate of 68, 6573 SVT runs longest lasting 21.6 seconds with average heart rate of 139 bpm, less than 1% A-fib burden with average heart rate 111 bpm with longest lasting 12 minutes, 13% isolated PACs.   Last seen in clinic on 05/06/2022 by  Delon, PA.  She had noted 1-2 episodes where she fell out of rhythm at the time.  Without significant palpitations noted medication changes were held and recommended appointment with Dr. Katina of [facility] to review event monitor.   He was seen by Dr. Katina at [facility] on 06/02/2022 and started on dronedarone 100 mg twice daily.   Patient had called into office on 08/05/2023 reporting that his Apple Watch noted heart rates up to 140 bpm and an episode of atrial fibrillation.  Patient was last seen in office on 08/13/2023.  He presented with a recent episode of A-fib detected by his Apple Watch.  The episode occurred on 08/05/2023 that was noted on Apple Watch, the episode self resolved, lasted few seconds, with heart rate up to 140 bpm.  The patient under care of an electrophysiologist in [place].  His A-fib has been managed with Multaq, Eliquis , and metoprolol .  He remained asymptomatic.  EKG in office showed normal sinus rhythm with stable chronic first-degree heart block.  He was to follow-up with EP.  No changes were made he is continued on current medication regimen.  He underwent 3-day cardiac event monitor on 09/2023 showing average heart rate of 65 bpm, 5% PAC burden, 125 runs of nonsustained atrial tachycardia up to 7 beats, no atrial fibrillation, PVC burden less than 1%, no NSVT.   Discussed the use of AI scribe software for clinical note transcription with the patient, who gave verbal consent to proceed.  History of Present Illness Karel R Abigail Mickey. Eligha is  an 83 year old male with atrial fibrillation who presents for follow-up of his cardiac condition. He is under the care of Dr. Katina in [place], [place]  for his atrial fibrillation.  Today patient is doing well overall.  He is without acute cardiovascular concerns or complaints today.  He denies any symptoms concerning for recurrent atrial fibrillation at this time.  He denies any chest pain or dyspnea.  He is managed with  Multaq for atrial fibrillation, which effectively prevents episodes without noticeable side effects. His Apple Watch frequently indicates atrial fibrillation, but this is likely attributed to his PACs.  He has had some leg swelling in his right leg for about 7 months and has began wearing compression socks.  This is being followed by Dr. Harvey of sports medicine.  He does have history of peripheral vein stripped in the left leg without significant swelling.  He did have an ablation in his right leg.  He denies any orthopnea, dyspnea, PND, lightheadedness, dizziness, palpitations, syncope, presyncope.  Review of systems:  Please see the history of present illness. All other systems are reviewed and otherwise negative.      Studies Reviewed:    EKG Interpretation Date/Time:  Wednesday February 16 2024 15:24:20 EDT Ventricular Rate:  75 PR Interval:  242 QRS Duration:  126 QT Interval:  414 QTC Calculation: 462 R Axis:   -44  Text Interpretation: Sinus rhythm with 1st degree A-V block with Premature supraventricular complexes Left axis deviation Non-specific intra-ventricular conduction block Confirmed by Rana Dixon [phone]) on 02/16/2024 4:35:12 PM    3 day cardiac event monitor 10/20/2023 Sinus rhythm with mean HR of 65 bpm (range 48-90 bpm), PAC burden of 5%, 125 runs of NSAT up to 7 beats, no AF, PVC burden of less than 1%, no NSVT, and 2 patient triggered events related to sinus bradycardia (1) and sinus rhythm with PACs and noise.   ZIO 03/04/2022 Patient had a min HR of 44 bpm, max HR of 179 bpm, and avg HR of 68 bpm. Predominant underlying rhythm was Sinus Rhythm. First Degree AV Block was present. Intermittent Bundle Branch Block was present. 1 run of Ventricular Tachycardia occurred lasting 5  beats with a max rate of 169 bpm (avg 155 bpm). 6573 Supraventricular Tachycardia runs occurred, the run with the fastest interval lasting 4 beats with a max rate of 179 bpm, the longest lasting  21.6 secs with an avg rate of 139 bpm. Atrial Fibrillation  occurred (<1% burden), ranging from 83-139 bpm (avg of 111 bpm), the longest lasting 11 mins 56 secs with an avg rate of 111 bpm. Isolated SVEs were frequent (13.0%, 185715), SVE Couplets were occasional (2.7%, 19573), and SVE Triplets were occasional  (3.7%, 17351). Isolated VEs were occasional (3.3%, 47430), VE Couplets were rare (<1.0%, 4111), and VE Triplets were rare (<1.0%, 57)   Echocardiogram 03/26/2022 1. Left ventricular ejection fraction, by estimation, is 60 to 65%. Left  ventricular ejection fraction by PLAX is 62 %. The left ventricle has  normal function. The left ventricle has no regional wall motion  abnormalities. Left ventricular diastolic  parameters were normal.   2. Right ventricular systolic function is normal. The right ventricular  size is normal. There is mildly elevated pulmonary artery systolic  pressure. The estimated right ventricular systolic pressure is 38.8 mmHg.   3. Right atrial size was severely dilated.   4. The mitral valve is myxomatous. Trivial mitral valve regurgitation.  There is mild late systolic prolapse of  both leaflets of the mitral valve.   5. 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   6. The aortic valve is tricuspid. Aortic valve regurgitation is not  visualized.   7. Aortic dilatation noted. There is borderline dilatation of the aortic  root, measuring 39 mm. There is borderline dilatation of the ascending  aorta, measuring 38 mm.   8. The inferior vena cava is normal in size with greater than 50%  respiratory variability, suggesting right atrial pressure of 3 mmHg.  Risk Assessment/Calculations:    CHA2DS2-VASc Score = 2   This indicates a 2.2% annual risk of stroke. The patient's score is based upon: CHF History: 0 HTN History: 0 Diabetes History: 0 Stroke History: 0 Vascular Disease History: 0 Age Score: 2 Gender Score: 0              Physical Exam:   VS:  BP (!) 98/54 (BP Location: Left Arm, Patient Position: Sitting, Cuff Size: Normal)   Pulse 75   Ht 6' 3 (1.905 m)   Wt 208 lb (94.3 kg)   BMI 26.00 kg/m    Wt Readings from Last 3 Encounters:  02/16/24 208 lb (94.3 kg)  02/08/24 208 lb (94.3 kg)  12/22/23 211 lb (95.7 kg)    GEN: Well nourished, well developed in no acute distress NECK: No JVD; No carotid bruits CARDIAC: RRR, no murmurs, rubs, gallops RESPIRATORY:  Clear to auscultation without rales, wheezing or rhonchi  ABDOMEN: Soft, non-tender, non-distended EXTREMITIES:  No edema; No acute deformity      Assessment and Plan:  Persistent atrial fibrillation  s/p ablation in 2011 and 2021 ZIO 03/2022 with less than 1% A-fib burden, 13% PAC burden ZIO 09/2023 with no atrial fibrillation, 5% PAC burden Echocardiogram 02/2022 with LVEF 60 to 65% EKG today NSR with stable chronic 1st degree block  - Today patient is without symptoms concerning for recurrent atrial fibrillation - Continue Metoprolol -XL 25mg  daily and Multaq 400mg  BID - Continue Eliquis  5mg  BID. Does not meet does reduction qualifications, no bleeding concerns - Managed by Dr. Katina with [facility] follow-up appointment on 05/2024   CHA2DS2-VASc Score = 2 [CHF History: 0, HTN History: 0, Diabetes History: 0, Stroke History: 0, Vascular Disease History: 0, Age Score: 2, Gender Score: 0].  Therefore, the patient's annual risk of stroke is 2.2 %.        Atrial tachycardia ZIO 03/2022 with 6573 runs of SVT ZIO 09/2023 with 125 runs of SVT Preserved EF on echo 02/2022 SVT under better control with improvement seen on most recently monitor - Today patient remains asymptomatic without palpitations or tachycardia - Continue current medication management - Managed by Dr. Katina with [facility] follow-up appointment on 05/2024  OSA - Adherent to CPAP therapy   Mitral valve prolapse Tricuspid regurgitation  Echocardiogram 02/2022 with mild late  systolic prolapse of both leaflets of mitral valve and moderate tricuspid valve regurgitation - Plan for echocardiogram for routine monitoring   Orthostatic hypotension  No reoccurrences over the past year - Continue to monitor       Dispo:  Return in about 6 months (around 08/18/2024).  Signed, Lum LITTIE Louis, NP  "

## 2024-02-22 ENCOUNTER — Ambulatory Visit (INDEPENDENT_AMBULATORY_CARE_PROVIDER_SITE_OTHER): Admitting: Sports Medicine

## 2024-02-22 VITALS — BP 104/66 | Ht 75.0 in | Wt 208.0 lb

## 2024-02-22 DIAGNOSIS — R269 Unspecified abnormalities of gait and mobility: Secondary | ICD-10-CM

## 2024-02-22 DIAGNOSIS — M51362 Other intervertebral disc degeneration, lumbar region with discogenic back pain and lower extremity pain: Secondary | ICD-10-CM

## 2024-02-22 DIAGNOSIS — M5386 Other specified dorsopathies, lumbar region: Secondary | ICD-10-CM | POA: Diagnosis not present

## 2024-02-22 NOTE — Progress Notes (Signed)
 Chief complaint sciatic pain down his left leg  Patient saw Dr. Teressa 2 weeks ago for worsening sciatica that left leg.  He has a progressive peripheral neuropathy.  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  Is been thought that his radicular symptoms probably originate in his lumbar spine.  Gabapentin  has been helpful in the past and he takes 300 mg 4 times a day. 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

## 2024-02-22 NOTE — Patient Instructions (Signed)
 Increase your gabapentin  so you are taking 2 tabs at bedtime, 5 tablets total for the day. When you need a refill on this let us  know. We gave you some hip abductor strengthening exercises to do. Use a hiking pole when you are going to be out walking for awhile.

## 2024-02-22 NOTE — Assessment & Plan Note (Signed)
 I had sent him to neuro PT because of what I saw is a high fall risk No recent falls but he feels unsteady at times and even has had some retropulsion Today he has dynamic genu valgus on the right with hip abduction weakness I gave him a hip abduction series but want him to pursue this at physical therapy until we can get improved right hip strength I also added more scaphoid padding for arch support to block pronation on the right I would like to recheck this in 6 weeks

## 2024-02-22 NOTE — Assessment & Plan Note (Signed)
 I suspect some worsening of his degenerative disc disease is probably contributing to his new flare of sciatica We may have to gradually increase gabapentin  as that seems to help Hopefully physical therapy can give him some good exercises to stabilize this as well

## 2024-02-22 NOTE — Assessment & Plan Note (Signed)
 We increased his gabapentin  to 600 for the nighttime dose as well as 300 3 times daily  Keep up the activities that do not bother him  Go to physical therapy and see if we can find a good program to reduce his sciatic symptoms

## 2024-02-23 DIAGNOSIS — G4733 Obstructive sleep apnea (adult) (pediatric): Secondary | ICD-10-CM | POA: Diagnosis not present

## 2024-02-29 ENCOUNTER — Encounter: Payer: Self-pay | Admitting: Sports Medicine

## 2024-02-29 ENCOUNTER — Ambulatory Visit: Admitting: Physical Therapy

## 2024-03-09 NOTE — Therapy (Signed)
 OUTPATIENT PHYSICAL THERAPY LOWER EXTREMITY EVALUATION   Patient Name: Wesley [name]. MRN: [id number] DOB:1940-07-28, 83 y.o., male Today's Date: 03/10/2024  END OF SESSION:  PT End of Session - 03/10/24 0850     Visit Number 1    Number of Visits 12    Date for PT Re-Evaluation 05/05/24    Authorization Type Medicare, Tricare    PT Start Time 0845    PT Stop Time 0935    PT Time Calculation (min) 50 min    Activity Tolerance Patient tolerated treatment well    Behavior During Therapy WFL for tasks assessed/performed          Past Medical History:  Diagnosis Date   A-fib (HCC)    cardioversion x2   Ankylosing spondylitis (HCC)    Atrial flutter (HCC)    GERD (gastroesophageal reflux disease)    Hemorrhoids    Irregular heart beat    MVP (mitral valve prolapse)    WITH A MIDSYSTOLIC CLICK   OSA (obstructive sleep apnea)    uses CPAP nightly   Peripheral neuropathy    Vitamin B12 deficiency    Past Surgical History:  Procedure Laterality Date   ABLATION     CARDIOVERSION N/A 12/20/2019   Procedure: CARDIOVERSION;  Surgeon: Wesley Oneil BROCKS, MD;  Location: [facility];  Service: Cardiovascular;  Laterality: N/A;   COLONOSCOPY     INGUINAL HERNIA REPAIR     LESION REMOVAL N/A 05/22/2022   Procedure: EXCISION UPPER BACK SEBACEOUS CYST;  Surgeon: Wesley Locus, MD;  Location: [facility];  Service: General;  Laterality: N/A;  LOCAL ANESTHESIA   venous ligation     for varices left lower leg   Patient Active Problem List   Diagnosis Date Noted   Sciatica of left side associated with disorder of lumbar spine 02/22/2024   Abnormality of gait 06/26/2021   Chronic hoarseness 10/04/2020   PAF (paroxysmal atrial fibrillation) (HCC)    DDD (degenerative disc disease), lumbar 10/28/2017   Bilateral bunions 08/20/2016   Varicose veins of right lower extremity with complications 01/07/2016   Bruit 10/10/2015   Varicose veins of leg with complications  10/01/2015   Ankylosing spondylitis (HCC) 01/14/2015   Chronic anticoagulation 01/14/2015   Sleep apnea 01/14/2015   S/P ablation of atrial fibrillation 01/08/2015   Persistent atrial fibrillation (HCC) 01/08/2015   Orthostatic dizziness 12/01/2013   Calf pain 11/22/2012   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/22/2012   Peripheral neuropathy 10/21/2012   Pain in joint, ankle and foot 09/21/2012   Disturbance of skin sensation 09/21/2012   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 (HCC) 09/21/2012   Other general symptoms(780.99) 09/21/2012   Heel pain 06/07/2012   Plantar fasciitis 06/07/2012   Mitral valve prolapse 06/03/2011   Pain in joint of right shoulder 04/03/2011   Rotator cuff syndrome of right shoulder 04/03/2011   Paroxysmal atrial fibrillation (HCC) 01/20/2011   Hypercholesterolemia 01/20/2011   OBSTRUCTIVE SLEEP APNEA 10/08/2009    PCP: Wesley Rush MD   REFERRING PROVIDER: [name] MD/ Wesley Seltzer MD   REFERRING DIAG: [phone] (ICD-10-CM) - Left sided sciatica  THERAPY DIAG:  Other abnormalities of gait and mobility  Pain in right leg  Muscle weakness (generalized)  Abnormal posture  Rationale for Evaluation and Treatment: Rehabilitation  ONSET DATE: acute on chronic   SUBJECTIVE:   SUBJECTIVE STATEMENT: Patient is familiar to me from her previous PT episode.  Around the 4th of July he noticed left lower extremity pain after  doing 3 days of cardio (elliptical/bike) . He could barely cross his leg over later that day due to tightness and pulling in the LLE.  (Behind the knee and thigh).  He has noticed weakness in his Rt LE and gait is changed.  He has diff sleeping on his side.    He I thought I was doing all the right things He does admit to less stability with walking and more balance issues lately.  He notes that his right toes turn and more on that side and the doctor thinks it is because of his weak hip.      PERTINENT HISTORY: Dr. Harvey  note  had sent him to neuro PT because of what I saw is a high fall risk No recent falls but he feels unsteady at times and even has had some retropulsion Today he has dynamic genu valgus on the right with hip abduction weakness I gave him a hip abduction series but want him to pursue this at physical therapy until we can get improved right hip strength  Patient saw Dr. Teressa 2 weeks ago for worsening sciatica that left leg.  He has a progressive peripheral neuropathy.  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  Is been thought that his radicular symptoms probably originate in his lumbar spine.   PAIN:  Are you having pain? Yes: NPRS scale: early AM 3/10- 4/10   Pain location: L thigh, buttock  Pain description: aching  Aggravating factors: standing too long (> 30) , sitting too long with knee flexed , can be 8/10  Relieving factors: gabapentin , stretching , moving , figure 4 stretch    PRECAUTIONS: None monitor balance  RED FLAGS: None   WEIGHT BEARING RESTRICTIONS: No  FALLS:  Has patient fallen in last 6 months? No but does note worsening balance lately.   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  PATIENT GOALS: To be rid of the pain   NEXT MD VISIT: as needed   OBJECTIVE:   Note: Objective measures were completed at Evaluation unless otherwise noted.  DIAGNOSTIC FINDINGS: none recent   PATIENT SURVEYS: NT     COGNITION: Overall cognitive status: Within functional limits for tasks assessed     SENSATION: Major numbness both legs, bottom 1/3 of my legs and feet  EDEMA:  None   MUSCLE LENGTH: Hamstrings: lacks about 10 deg each leg.  Rt LE SLR 55 and Lt. 45 deg   Thomas test: NT   POSTURE: rounded shoulders, forward head, increased thoracic kyphosis, posterior pelvic tilt, and flexed trunk   PALPATION: Pain to palpation of left piriformis but only minimal to moderate No pain in the left hamstring or calf  LOWER EXTREMITY ROM: Within functional limits Patient notices a greater sense of tightness in the left hamstring with passive stretching  Active ROM Right eval Left eval  Hip flexion    Hip extension    Hip abduction    Hip adduction    Hip internal rotation    Hip external rotation    Knee flexion    Knee extension    Ankle dorsiflexion    Ankle plantarflexion    Ankle inversion  Ankle eversion     (Blank rows = not tested)  LOWER EXTREMITY MMT:  MMT Right eval Left eval  Hip flexion 5 5  Hip extension 4 4  Hip abduction 3+ 4  Hip adduction    Hip internal rotation    Hip external rotation    Knee flexion 5 4+  Knee extension 5 4+  Ankle dorsiflexion    Ankle plantarflexion    Ankle inversion    Ankle eversion     (Blank rows = not tested)  LOWER EXTREMITY SPECIAL TESTS:  NT   FUNCTIONAL TESTS:  5 times sit to stand: 17 sec  Needs UE assit to balance on one leg , hip compensation, trunk lean   GAIT: Distance walked: 150 Assistive device utilized: None Level of assistance: Modified independence Comments: Trunk flexed upper back rounded and shoulders forward.  Apparent decrease in proprioception in the lower body with decreased hip stability lack of arm swing and right foot slight lead turn and word                                                                                                                                TREATMENT DATE:   [facility] Adult PT Treatment:                                                DATE: 03/10/24 Therapeutic Exercise: Figure 4 Hamstring stretch Lower trunk rotation Bridges with a 5-second hold Self Care: Discussed the overlap of symptoms including possible source of leg  pain including lumbar radiculopathy, arthritic spine, piriformis syndrome, sciatica. Discussed at length his current program and recommended he focus less on the long cardio session and more on streamlining his strengthening sessions whether it be weight machines or just body weight   PATIENT EDUCATION:  Education details: See above self-care Person educated: Patient Education method: Explanation, Demonstration, Tactile cues, Verbal cues, and Handouts Education comprehension: verbalized understanding and needs further education  HOME EXERCISE PROGRAM: Has from previous PT sessions in a folder.  ASSESSMENT:  CLINICAL IMPRESSION: Patient is a 83 y.o. male who was seen today  physical therapy evaluation and treatment for gait abnormality and left-sided sciatic symptoms.   OBJECTIVE IMPAIRMENTS: Abnormal gait, decreased balance, decreased coordination, decreased knowledge of condition, decreased mobility, difficulty walking, decreased strength, increased fascial restrictions, impaired flexibility, impaired UE functional use, improper body mechanics, postural dysfunction, and pain.   ACTIVITY LIMITATIONS: lifting, standing, stairs, transfers, and locomotion level  PARTICIPATION LIMITATIONS: community activity and occupation  PERSONAL FACTORS: Age, Time since onset of injury/illness/exacerbation, and 3+ comorbidities: AS, A-fib, Neuropathy are also affecting patient's functional outcome.   REHAB POTENTIAL: Excellent  CLINICAL DECISION MAKING: Evolving/moderate complexity  EVALUATION COMPLEXITY: Moderate   GOALS: Goals reviewed with patient? Yes  SHORT TERM GOALS: Target date: 04/07/2024  Patient will be able to show independence for initial HEP to include posture, core and hip strength and stability.   Baseline: Goal status: INITIAL  2.  Patient will be complete functional mobility testing/balance  and goal set  Baseline:  Goal status: INITIAL  3.  Pt will modify workout  schedule to a strength focus Baseline:  Goal status: INITIAL   LONG TERM GOALS: Target date: 05/05/2024    Patient will be independent with final HEP upon discharge from PT and report consistent benefit following exercise completion.    Baseline:  Goal status: INITIAL  2.  Pt will be able to demonstrate good standing balance as evidenced by balance recovery from min to mod dynamic balance challenges.   Baseline:  Goal status: INITIAL  3.  Patient will be able to demonstrate hip/knee/ankle strength to 5/5 in order to maximize functional mobility, ambulation and lifting.   Baseline:  Goal status: INITIAL  4.  Patient will be able to perform sit to stand  x 10 in  30 sec Baseline: 18 sec x 5  Goal status: INITIAL  5.  Patient will feel more confident and stable in his balance when at church Baseline:  Goal status: INITIAL  6.  Pt will be abel to stand for 30 min without increased LLE pain.  Baseline:  Goal status: INITIAL   PLAN:  PT FREQUENCY: 1-2x/week  PT DURATION: 8 weeks  PLANNED INTERVENTIONS: 97164- PT Re-evaluation, 97750- Physical Performance Testing, 97110-Therapeutic exercises, 97530- Therapeutic activity, [id number]- Neuromuscular re-education, 97535- Self Care, 02859- Manual therapy, [id number]- Gait training, [phone]- Aquatic Therapy, [phone] (1-2 muscles), 20561 (3+ muscles)- Dry Needling, Patient/Family education, Balance training, Taping, Spinal mobilization, Cryotherapy, and Moist heat  PLAN FOR NEXT SESSION: balance test , walking.  Review HEP.    Jashira Cotugno, PT 03/10/2024, 11:37 AM   Delon Norma, PT 03/10/24 12:51 PM Phone: [phone] Fax: [phone]

## 2024-03-10 ENCOUNTER — Encounter: Payer: Self-pay | Admitting: Physical Therapy

## 2024-03-10 ENCOUNTER — Ambulatory Visit: Attending: Family Medicine | Admitting: Physical Therapy

## 2024-03-10 DIAGNOSIS — R2689 Other abnormalities of gait and mobility: Secondary | ICD-10-CM | POA: Diagnosis not present

## 2024-03-10 DIAGNOSIS — M79605 Pain in left leg: Secondary | ICD-10-CM | POA: Diagnosis not present

## 2024-03-10 DIAGNOSIS — M5432 Sciatica, left side: Secondary | ICD-10-CM | POA: Insufficient documentation

## 2024-03-10 DIAGNOSIS — M6281 Muscle weakness (generalized): Secondary | ICD-10-CM | POA: Insufficient documentation

## 2024-03-10 DIAGNOSIS — R293 Abnormal posture: Secondary | ICD-10-CM | POA: Diagnosis not present

## 2024-03-10 DIAGNOSIS — M79604 Pain in right leg: Secondary | ICD-10-CM | POA: Insufficient documentation

## 2024-03-14 ENCOUNTER — Ambulatory Visit: Admitting: Physical Therapy

## 2024-03-14 ENCOUNTER — Encounter: Payer: Self-pay | Admitting: Physical Therapy

## 2024-03-14 DIAGNOSIS — R2689 Other abnormalities of gait and mobility: Secondary | ICD-10-CM | POA: Diagnosis not present

## 2024-03-14 DIAGNOSIS — M6281 Muscle weakness (generalized): Secondary | ICD-10-CM

## 2024-03-14 DIAGNOSIS — M79605 Pain in left leg: Secondary | ICD-10-CM | POA: Diagnosis not present

## 2024-03-14 DIAGNOSIS — M5432 Sciatica, left side: Secondary | ICD-10-CM | POA: Diagnosis not present

## 2024-03-14 DIAGNOSIS — R293 Abnormal posture: Secondary | ICD-10-CM

## 2024-03-14 DIAGNOSIS — M79604 Pain in right leg: Secondary | ICD-10-CM | POA: Diagnosis not present

## 2024-03-14 NOTE — Therapy (Signed)
 OUTPATIENT PHYSICAL THERAPY LOWER EXTREMITY EVALUATION   Patient Name: Wesley [name]. MRN: [id number] DOB:Dec 19, 1940, 83 y.o., male Today's Date: 03/14/2024  END OF SESSION:  PT End of Session - 03/14/24 1016     Visit Number 2    Number of Visits 12    Date for PT Re-Evaluation 05/05/24    Authorization Type Medicare, Tricare    PT Start Time 1015    PT Stop Time 1105    PT Time Calculation (min) 50 min    Activity Tolerance Patient tolerated treatment well    Behavior During Therapy WFL for tasks assessed/performed          Past Medical History:  Diagnosis Date   A-fib (HCC)    cardioversion x2   Ankylosing spondylitis (HCC)    Atrial flutter (HCC)    GERD (gastroesophageal reflux disease)    Hemorrhoids    Irregular heart beat    MVP (mitral valve prolapse)    WITH A MIDSYSTOLIC CLICK   OSA (obstructive sleep apnea)    uses CPAP nightly   Peripheral neuropathy    Vitamin B12 deficiency    Past Surgical History:  Procedure Laterality Date   ABLATION     CARDIOVERSION N/A 12/20/2019   Procedure: CARDIOVERSION;  Surgeon: [name], MD;  Location: [facility];  Service: Cardiovascular;  Laterality: N/A;   COLONOSCOPY     INGUINAL HERNIA REPAIR     LESION REMOVAL N/A 05/22/2022   Procedure: EXCISION UPPER BACK SEBACEOUS CYST;  Surgeon: [name], MD;  Location: [facility];  Service: General;  Laterality: N/A;  LOCAL ANESTHESIA   venous ligation     for varices left lower leg   Patient Active Problem List   Diagnosis Date Noted   Sciatica of left side associated with disorder of lumbar spine 02/22/2024   Abnormality of gait 06/26/2021   Chronic hoarseness 10/04/2020   PAF (paroxysmal atrial fibrillation) (HCC)    DDD (degenerative disc disease), lumbar 10/28/2017   Bilateral bunions 08/20/2016   Varicose veins of right lower extremity with complications 01/07/2016   Bruit 10/10/2015   Varicose veins of leg with complications  10/01/2015   Ankylosing spondylitis (HCC) 01/14/2015   Chronic anticoagulation 01/14/2015   Sleep apnea 01/14/2015   S/P ablation of atrial fibrillation 01/08/2015   Persistent atrial fibrillation (HCC) 01/08/2015   Orthostatic dizziness 12/01/2013   Calf pain 11/22/2012   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/22/2012   Peripheral neuropathy 10/21/2012   Pain in joint, ankle and foot 09/21/2012   Disturbance of skin sensation 09/21/2012   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 (HCC) 09/21/2012   Other general symptoms(780.99) 09/21/2012   Heel pain 06/07/2012   Plantar fasciitis 06/07/2012   Mitral valve prolapse 06/03/2011   Pain in joint of right shoulder 04/03/2011   Rotator cuff syndrome of right shoulder 04/03/2011   Paroxysmal atrial fibrillation (HCC) 01/20/2011   Hypercholesterolemia 01/20/2011   OBSTRUCTIVE SLEEP APNEA 10/08/2009    PCP: [name] MD   REFERRING PROVIDER: [name] MD/ [name] MD   REFERRING DIAG: [phone] (ICD-10-CM) - Left sided sciatica  THERAPY DIAG:  Other abnormalities of gait and mobility  Muscle weakness (generalized)  Abnormal posture  Pain in left leg  Rationale for Evaluation and Treatment: Rehabilitation  ONSET DATE: acute on chronic   SUBJECTIVE:   SUBJECTIVE STATEMENT: Patient is familiar to me from her previous PT episode.  Around the 4th of July he noticed left lower extremity pain after  doing 3 days of cardio (elliptical/bike) . He could barely cross his leg over later that day due to tightness and pulling in the LLE.  (Behind the knee and thigh).  He has noticed weakness in his Rt LE and gait is changed.  He has diff sleeping on his side.    He I thought I was doing all the right things He does admit to less stability with walking and more balance issues lately.  He notes that his right toes turn and more on that side and the doctor thinks it is because of his weak hip.      PERTINENT HISTORY: Dr. Harvey  note  had sent him to neuro PT because of what I saw is a high fall risk No recent falls but he feels unsteady at times and even has had some retropulsion Today he has dynamic genu valgus on the right with hip abduction weakness I gave him a hip abduction series but want him to pursue this at physical therapy until we can get improved right hip strength  Patient saw Dr. Teressa 2 weeks ago for worsening sciatica that left leg.  He has a progressive peripheral neuropathy.  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  Is been thought that his radicular symptoms probably originate in his lumbar spine.   PAIN:  Are you having pain? Yes: NPRS scale: early AM 3/10- 4/10   Pain location: L thigh, buttock  Pain description: aching  Aggravating factors: standing too long (> 30) , sitting too long with knee flexed , can be 8/10  Relieving factors: gabapentin , stretching , moving , figure 4 stretch    PRECAUTIONS: None monitor balance  RED FLAGS: None   WEIGHT BEARING RESTRICTIONS: No  FALLS:  Has patient fallen in last 6 months? No but does note worsening balance lately.   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  PATIENT GOALS: To be rid of the pain   NEXT MD VISIT: as needed   OBJECTIVE:   Note: Objective measures were completed at Evaluation unless otherwise noted.  DIAGNOSTIC FINDINGS: none recent   PATIENT SURVEYS: NT     COGNITION: Overall cognitive status: Within functional limits for tasks assessed     SENSATION: Major numbness both legs, bottom 1/3 of my legs and feet  EDEMA:  None   MUSCLE LENGTH: Hamstrings: lacks about 10 deg each leg.  Rt LE SLR 55 and Lt. 45 deg   Thomas test: NT   POSTURE: rounded shoulders, forward head, increased thoracic kyphosis, posterior pelvic tilt, and flexed trunk   PALPATION: Pain to palpation of left piriformis but only minimal to moderate No pain in the left hamstring or calf  LOWER EXTREMITY ROM: Within functional limits Patient notices a greater sense of tightness in the left hamstring with passive stretching  Active ROM Right eval Left eval  Hip flexion    Hip extension    Hip abduction    Hip adduction    Hip internal rotation    Hip external rotation    Knee flexion    Knee extension    Ankle dorsiflexion    Ankle plantarflexion    Ankle inversion  Ankle eversion     (Blank rows = not tested)  LOWER EXTREMITY MMT:  MMT Right eval Left eval  Hip flexion 5 5  Hip extension 4 4  Hip abduction 3+ 4  Hip adduction    Hip internal rotation    Hip external rotation    Knee flexion 5 4+  Knee extension 5 4+  Ankle dorsiflexion    Ankle plantarflexion    Ankle inversion    Ankle eversion     (Blank rows = not tested)  LOWER EXTREMITY SPECIAL TESTS:  NT   FUNCTIONAL TESTS:  5 times sit to stand: 17 sec  Needs UE assit to balance on one leg , hip compensation, trunk lean   GAIT: Distance walked: 150 Assistive device utilized: None Level of assistance: Modified independence Comments: Trunk flexed upper back rounded and shoulders forward.  Apparent decrease in proprioception in the lower body with decreased hip stability lack of arm swing and right foot slight lead turn and word                                                                                                                                TREATMENT DATE:    [facility] Adult PT Treatment:                                                DATE: 03/14/24  Therapeutic Exercise: Hamstring stretch 3 x 30 sec  Figure 4 press out and hold 30 sec x 3 Lower trunk rotation Bridges (figure 4) with a 5-second hold  please good  talking8Therapeutic Activity: 2 min walk test 506 feet no increased pain , cues for posture and safety Standing SL balance UE assist on countertop  Sidestepping with GTB x 1 min x 2 rounds  Hip abduction and extension GTB x 10 each LE  Sit to stand with 10 lbs  x 10 Added chest press 10 lbs x 10  Seated hamstring stretch  Self Care: Pt verbalized his HEP routine per his request   Taking time to hold a stretch  Gentle motions of the spine (vs hard and fast cervical stretching)   [facility] Adult PT Treatment:                                                DATE: 03/10/24 Therapeutic Exercise: Figure 4 Hamstring stretch Lower trunk rotation Bridges with a 5-second hold Self Care: Discussed the overlap of symptoms including possible source of leg pain including lumbar radiculopathy, arthritic spine, piriformis syndrome, sciatica. Discussed at length his current program and recommended he focus less on the long cardio session and more on streamlining his strengthening sessions  whether it be weight machines or just body weight   PATIENT EDUCATION:  Education details: See above self-care Person educated: Patient Education method: Explanation, Demonstration, Tactile cues, Verbal cues, and Handouts Education comprehension: verbalized understanding and needs further education  HOME EXERCISE PROGRAM: Access Code: [email or web address] URL: [email or web address] Date: 03/14/2024 Prepared by: Delon Norma  Exercises - Sidelying Hip Abduction  - 1 x daily - 7 x weekly - 3 sets - 10 reps - Sidestepping  - 1 x daily - 7 x weekly - 3 sets - 10 reps - Single Leg Stance with Support  - 1 x daily - 7 x weekly - 1 sets - 3-5 reps - 30 hold - Standing Hip Abduction with Resistance at Ankles and Counter Support  - 1 x daily - 7 x weekly - 2 sets - 10 reps - 5 hold - Hip Extension with Resistance Loop  - 1 x daily - 7 x weekly - 2 sets - 10 reps - 5 hold    ASSESSMENT:  CLINICAL IMPRESSION: Patient  was able to demonstrate his home routine, accepted some feedback about streamlining his home program.  Focus on hip abduction and extension strength, pelvic stability.  Patient will benefit from continued PT to address gait and balance as well.   OBJECTIVE IMPAIRMENTS: Abnormal gait, decreased balance, decreased coordination, decreased knowledge of condition, decreased mobility, difficulty walking, decreased strength, increased fascial restrictions, impaired flexibility, impaired UE functional use, improper body mechanics, postural dysfunction, and pain.   ACTIVITY LIMITATIONS: lifting, standing, stairs, transfers, and locomotion level  PARTICIPATION LIMITATIONS: community activity and occupation  PERSONAL FACTORS: Age, Time since onset of injury/illness/exacerbation, and 3+ comorbidities: AS, A-fib, Neuropathy are also affecting patient's functional outcome.   REHAB POTENTIAL: Excellent  CLINICAL DECISION MAKING: Evolving/moderate complexity  EVALUATION COMPLEXITY: Moderate   GOALS: Goals reviewed with patient? Yes  SHORT TERM GOALS: Target date: 04/07/2024   Patient will be able to show independence for initial HEP to include posture, core and hip strength and stability.   Baseline: Goal status:MET   2.  Patient will be complete functional mobility testing/balance and goal set  Baseline:  Goal status:MET   3.  Pt will modify workout schedule to a strength focus Baseline:  Goal status: INITIAL   LONG TERM GOALS: Target date: 05/05/2024    Patient will be independent with final HEP upon discharge from PT and report consistent benefit following exercise completion.    Baseline:  Goal status: INITIAL  2.  Pt will be able to demonstrate good standing balance as evidenced by balance recovery from min to mod dynamic balance challenges.   Baseline:  Goal status: INITIAL  3.  Patient will be able to demonstrate hip/knee/ankle strength to 5/5 in order to maximize functional  mobility, ambulation and lifting.   Baseline:  Goal status: INITIAL  4.  Patient will be able to perform sit to stand  x 10 in  30 sec Baseline: 18 sec x 5  Goal status: INITIAL  5.  Patient will feel more confident and stable in his balance when at church Baseline:  Goal status: INITIAL  6.  Pt will be abel to stand for 30 min without increased LLE pain.  Baseline:  Goal status: INITIAL   PLAN:  PT FREQUENCY: 1-2x/week  PT DURATION: 8 weeks  PLANNED INTERVENTIONS: 97164- PT Re-evaluation, 97750- Physical Performance Testing, 97110-Therapeutic exercises, 97530- Therapeutic activity, [id number]- Neuromuscular re-education, 97535- Self Care, 02859- Manual therapy, [id number]- Gait training, [phone]- Aquatic Therapy, [phone] (1-2 muscles),  [id number] (3+ muscles)- Dry Needling, Patient/Family education, Balance training, Taping, Spinal mobilization, Cryotherapy, and Moist heat  PLAN FOR NEXT SESSION: balance test , DGI    Veanna Dower, PT 03/14/2024, 12:56 PM   Delon Norma, PT 03/14/24 12:56 PM Phone: [phone] Fax: [phone]

## 2024-03-17 ENCOUNTER — Encounter: Payer: Self-pay | Admitting: Physical Therapy

## 2024-03-17 ENCOUNTER — Ambulatory Visit: Payer: Self-pay | Admitting: Physical Therapy

## 2024-03-17 DIAGNOSIS — M79604 Pain in right leg: Secondary | ICD-10-CM | POA: Diagnosis not present

## 2024-03-17 DIAGNOSIS — R2689 Other abnormalities of gait and mobility: Secondary | ICD-10-CM

## 2024-03-17 DIAGNOSIS — M5432 Sciatica, left side: Secondary | ICD-10-CM | POA: Diagnosis not present

## 2024-03-17 DIAGNOSIS — M6281 Muscle weakness (generalized): Secondary | ICD-10-CM

## 2024-03-17 DIAGNOSIS — M79605 Pain in left leg: Secondary | ICD-10-CM

## 2024-03-17 DIAGNOSIS — R293 Abnormal posture: Secondary | ICD-10-CM

## 2024-03-17 NOTE — Therapy (Signed)
 OUTPATIENT PHYSICAL THERAPY LOWER EXTREMITY EVALUATION   Patient Name: Wesley [name]. MRN: [id number] DOB:Jul 20, 1941, 83 y.o., male Today's Date: 03/18/2024  END OF SESSION:  PT End of Session - 03/17/24 0939     Visit Number 3    Number of Visits 12    Date for PT Re-Evaluation 05/05/24    Authorization Type Medicare, [employer]    PT Start Time 0935    PT Stop Time 1015    PT Time Calculation (min) 40 min    Activity Tolerance Patient tolerated treatment well    Behavior During Therapy WFL for tasks assessed/performed          Past Medical History:  Diagnosis Date   A-fib (HCC)    cardioversion x2   Ankylosing spondylitis (HCC)    Atrial flutter (HCC)    GERD (gastroesophageal reflux disease)    Hemorrhoids    Irregular heart beat    MVP (mitral valve prolapse)    WITH A MIDSYSTOLIC CLICK   OSA (obstructive sleep apnea)    uses CPAP nightly   Peripheral neuropathy    Vitamin B12 deficiency    Past Surgical History:  Procedure Laterality Date   ABLATION     CARDIOVERSION N/A 12/20/2019   Procedure: CARDIOVERSION;  Surgeon: [name], MD;  Location: [facility];  Service: Cardiovascular;  Laterality: N/A;   COLONOSCOPY     INGUINAL HERNIA REPAIR     LESION REMOVAL N/A 05/22/2022   Procedure: EXCISION UPPER BACK SEBACEOUS CYST;  Surgeon: [name], MD;  Location: [facility];  Service: General;  Laterality: N/A;  LOCAL ANESTHESIA   venous ligation     for varices left lower leg   Patient Active Problem List   Diagnosis Date Noted   Sciatica of left side associated with disorder of lumbar spine 02/22/2024   Abnormality of gait 06/26/2021   Chronic hoarseness 10/04/2020   PAF (paroxysmal atrial fibrillation) (HCC)    DDD (degenerative disc disease), lumbar 10/28/2017   Bilateral bunions 08/20/2016   Varicose veins of right lower extremity with complications 01/07/2016   Bruit 10/10/2015   Varicose veins of leg with complications  10/01/2015   Ankylosing spondylitis (HCC) 01/14/2015   Chronic anticoagulation 01/14/2015   Sleep apnea 01/14/2015   S/P ablation of atrial fibrillation 01/08/2015   Persistent atrial fibrillation (HCC) 01/08/2015   Orthostatic dizziness 12/01/2013   Calf pain 11/22/2012   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/22/2012   Peripheral neuropathy 10/21/2012   Pain in joint, ankle and foot 09/21/2012   Disturbance of skin sensation 09/21/2012   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 (HCC) 09/21/2012   Other general symptoms(780.99) 09/21/2012   Heel pain 06/07/2012   Plantar fasciitis 06/07/2012   Mitral valve prolapse 06/03/2011   Pain in joint of right shoulder 04/03/2011   Rotator cuff syndrome of right shoulder 04/03/2011   Paroxysmal atrial fibrillation (HCC) 01/20/2011   Hypercholesterolemia 01/20/2011   OBSTRUCTIVE SLEEP APNEA 10/08/2009    PCP: [name] MD   REFERRING PROVIDER: [name] MD/ [name] MD   REFERRING DIAG: [phone] (ICD-10-CM) - Left sided sciatica  THERAPY DIAG:  Other abnormalities of gait and mobility  Muscle weakness (generalized)  Abnormal posture  Pain in left leg  Rationale for Evaluation and Treatment: Rehabilitation  ONSET DATE: acute on chronic   SUBJECTIVE:   SUBJECTIVE STATEMENT: Its a combination of tightness and weakness.  More questions about his HEP.  Has not done cardio yet.    He  I thought I was doing all the right things He does admit to less stability with walking and more balance issues lately.  He notes that his right toes turn and more on that side and the doctor thinks it is because of his weak hip.      PERTINENT HISTORY: Dr. Harvey note  had sent him to neuro PT because of what I saw is a high fall risk No recent falls but he feels unsteady at times and even has had some retropulsion Today he has dynamic genu valgus on the right with hip abduction weakness I gave him a hip abduction series but want him  to pursue this at physical therapy until we can get improved right hip strength  Patient saw Dr. Teressa 2 weeks ago for worsening sciatica that left leg.  He has a progressive peripheral neuropathy.  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  Is been thought that his radicular symptoms probably originate in his lumbar spine.   PAIN:  Are you having pain? Yes: NPRS scale: early AM 3/10- 4/10   Pain location: L thigh, buttock  Pain description: aching  Aggravating factors: standing too long (> 30) , sitting too long with knee flexed , can be 8/10  Relieving factors: gabapentin , stretching , moving , figure 4 stretch    PRECAUTIONS: None monitor balance  RED FLAGS: None   WEIGHT BEARING RESTRICTIONS: No  FALLS:  Has patient fallen in last 6 months? No but does note worsening balance lately.   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  PATIENT GOALS: To be rid of the pain   NEXT MD VISIT: as needed   OBJECTIVE:   Note: Objective measures were completed at Evaluation unless otherwise noted.  DIAGNOSTIC FINDINGS: none recent   PATIENT SURVEYS: NT     COGNITION: Overall cognitive status: Within functional limits for tasks assessed     SENSATION: Major numbness both legs, bottom 1/3 of my legs and feet  EDEMA:  None   MUSCLE LENGTH: Hamstrings: lacks about 10 deg each leg.  Rt LE SLR 55 and Lt. 45 deg  Thomas test: NT   POSTURE: rounded shoulders, forward head, increased thoracic kyphosis, posterior pelvic tilt, and flexed trunk   PALPATION: Pain to palpation of left piriformis but only minimal to moderate No pain in the left hamstring or calf  LOWER EXTREMITY ROM: Within  functional limits Patient notices a greater sense of tightness in the left hamstring with passive stretching  Active ROM Right eval Left eval  Hip flexion    Hip extension    Hip abduction    Hip adduction    Hip internal rotation    Hip external rotation    Knee flexion    Knee extension    Ankle dorsiflexion    Ankle plantarflexion    Ankle inversion    Ankle eversion     (Blank rows = not tested)  LOWER EXTREMITY MMT:  MMT Right eval Left eval  Hip flexion 5 5  Hip extension 4 4  Hip abduction 3+ 4  Hip adduction    Hip internal rotation    Hip external rotation    Knee  flexion 5 4+  Knee extension 5 4+  Ankle dorsiflexion    Ankle plantarflexion    Ankle inversion    Ankle eversion     (Blank rows = not tested)  LOWER EXTREMITY SPECIAL TESTS:  NT   FUNCTIONAL TESTS:  5 times sit to stand: 17 sec  Needs UE assit to balance on one leg , hip compensation, trunk lean   GAIT: Distance walked: 150 Assistive device utilized: None Level of assistance: Modified independence Comments: Trunk flexed upper back rounded and shoulders forward.  Apparent decrease in proprioception in the lower body with decreased hip stability lack of arm swing and right foot slight lead turn and word                                                                                                                                TREATMENT DATE:   [facility] Adult PT Treatment:                                                DATE: 03/17/24 Therapeutic Exercise: Slantboard 1 min  Calf stretch off step limited  Slow prance off step  Knee to chest  LTR  Hamstring/calf stretch purple power cord Bridge x 15, 5 sec hold  Hip abduction with cues for technique  Figure 4   Therapeutic Activity: Step up with opp knee lift - 2 sets   [facility] Adult PT Treatment:                                                DATE: 03/14/24  Therapeutic Exercise: Hamstring stretch 3 x 30 sec  Figure 4 press out and  hold 30 sec x 3 Lower trunk rotation Bridges (figure 4) with a 5-second hold  please good talking8Therapeutic Activity: 2 min walk test 506 feet no increased pain , cues for posture and safety Standing SL balance UE assist on countertop  Sidestepping with GTB x 1 min x 2 rounds  Hip abduction and extension GTB x 10 each LE  Sit to stand with 10 lbs  x 10 Added chest press 10 lbs x 10  Seated hamstring stretch  Self Care: Pt verbalized his HEP routine per his request   Taking time to hold a stretch  Gentle motions of the spine (vs hard and fast cervical stretching)   [facility] Adult PT Treatment:                                                DATE: 03/10/24 Therapeutic Exercise:  Figure 4 Hamstring stretch Lower trunk rotation Bridges with a 5-second hold Self Care: Discussed the overlap of symptoms including possible source of leg pain including lumbar radiculopathy, arthritic spine, piriformis syndrome, sciatica. Discussed at length his current program and recommended he focus less on the long cardio session and more on streamlining his strengthening sessions whether it be weight machines or just body weight   PATIENT EDUCATION:  Education details: See above self-care Person educated: Patient Education method: Explanation, Demonstration, Tactile cues, Verbal cues, and Handouts Education comprehension: verbalized understanding and needs further education  HOME EXERCISE PROGRAM: Access Code: [email or web address] URL: [email or web address] Date: 03/14/2024 Prepared by: Delon Norma  Exercises - Sidelying Hip Abduction  - 1 x daily - 7 x weekly - 3 sets - 10 reps - Sidestepping  - 1 x daily - 7 x weekly - 3 sets - 10 reps - Single Leg Stance with Support  - 1 x daily - 7 x weekly - 1 sets - 3-5 reps - 30 hold - Standing Hip Abduction with Resistance at Ankles and Counter Support  - 1 x daily - 7 x weekly - 2 sets - 10 reps - 5 hold - Hip Extension with Resistance Loop  - 1 x daily  - 7 x weekly - 2 sets - 10 reps - 5 hold    ASSESSMENT:  CLINICAL IMPRESSION: Patient had increase in pain in his LLE with stretching calf and then performing heel raises.  Pain increased to about 6/10 from a 3/10. He was unable to effectively work on step ups for both balance and strength due to pain despite modifications.  The pain did resolve toward the end of the session when we focused on supine stretching and hip mobility.  He does feel like he is making some progress but the pain is easily exacerbated with standing . The patient will continue to benefit from skilled PT in order to return to PLOF and optimize functional mobility.      Patient was able to demonstrate his home routine, accepted some feedback about streamlining his home program.  Focus on hip abduction and extension strength, pelvic stability.  Patient will benefit from continued PT to address gait and balance as well.   OBJECTIVE IMPAIRMENTS: Abnormal gait, decreased balance, decreased coordination, decreased knowledge of condition, decreased mobility, difficulty walking, decreased strength, increased fascial restrictions, impaired flexibility, impaired UE functional use, improper body mechanics, postural dysfunction, and pain.   ACTIVITY LIMITATIONS: lifting, standing, stairs, transfers, and locomotion level  PARTICIPATION LIMITATIONS: community activity and occupation  PERSONAL FACTORS: Age, Time since onset of injury/illness/exacerbation, and 3+ comorbidities: AS, A-fib, Neuropathy are also affecting patient's functional outcome.   REHAB POTENTIAL: Excellent  CLINICAL DECISION MAKING: Evolving/moderate complexity  EVALUATION COMPLEXITY: Moderate   GOALS: Goals reviewed with patient? Yes  SHORT TERM GOALS: Target date: 04/07/2024   Patient will be able to show independence for initial HEP to include posture, core and hip strength and stability.   Baseline: Goal status:MET   2.  Patient will be complete  functional mobility testing/balance and goal set  Baseline:  Goal status:MET   3.  Pt will modify workout schedule to a strength focus Baseline:  Goal status: INITIAL   LONG TERM GOALS: Target date: 05/05/2024    Patient will be independent with final HEP upon discharge from PT and report consistent benefit following exercise completion.    Baseline:  Goal status: INITIAL  2.  Pt will be able to demonstrate good standing balance as  evidenced by balance recovery from min to mod dynamic balance challenges.   Baseline:  Goal status: INITIAL  3.  Patient will be able to demonstrate hip/knee/ankle strength to 5/5 in order to maximize functional mobility, ambulation and lifting.   Baseline:  Goal status: INITIAL  4.  Patient will be able to perform sit to stand  x 10 in  30 sec Baseline: 18 sec x 5  Goal status: INITIAL  5.  Patient will feel more confident and stable in his balance when at church Baseline:  Goal status: INITIAL  6.  Pt will be abel to stand for 30 min without increased LLE pain.  Baseline:  Goal status: INITIAL   PLAN:  PT FREQUENCY: 1-2x/week  PT DURATION: 8 weeks  PLANNED INTERVENTIONS: 97164- PT Re-evaluation, 97750- Physical Performance Testing, 97110-Therapeutic exercises, 97530- Therapeutic activity, 97112- Neuromuscular re-education, 97535- Self Care, [id number]- Manual therapy, [phone]- Gait training, [phone]- Aquatic Therapy, [phone] (1-2 muscles), 20561 (3+ muscles)- Dry Needling, Patient/Family education, Balance training, Taping, Spinal mobilization, Cryotherapy, and Moist heat  PLAN FOR NEXT SESSION: balance test , DGI    Kimm Ungaro, PT 03/18/2024, 12:48 PM   Delon Norma, PT 03/18/24 12:48 PM Phone: [phone] Fax: [phone]

## 2024-03-27 NOTE — Therapy (Unsigned)
 OUTPATIENT PHYSICAL THERAPY LOWER EXTREMITY NOTE    Patient Name: Wesley [name]. MRN: [id number] DOB:1941-06-14, 83 y.o., male Today's Date: 03/28/2024  END OF SESSION:  PT End of Session - 03/28/24 1159     Visit Number 4    Number of Visits 12    Date for PT Re-Evaluation 05/05/24    Authorization Type Medicare, [employer]    PT Start Time 1147    PT Stop Time 1230    PT Time Calculation (min) 43 min    Activity Tolerance Patient tolerated treatment well    Behavior During Therapy WFL for tasks assessed/performed           Past Medical History:  Diagnosis Date   A-fib (HCC)    cardioversion x2   Ankylosing spondylitis (HCC)    Atrial flutter (HCC)    GERD (gastroesophageal reflux disease)    Hemorrhoids    Irregular heart beat    MVP (mitral valve prolapse)    WITH A MIDSYSTOLIC CLICK   OSA (obstructive sleep apnea)    uses CPAP nightly   Peripheral neuropathy    Vitamin B12 deficiency    Past Surgical History:  Procedure Laterality Date   ABLATION     CARDIOVERSION N/A 12/20/2019   Procedure: CARDIOVERSION;  Surgeon: [name], MD;  Location: [facility];  Service: Cardiovascular;  Laterality: N/A;   COLONOSCOPY     INGUINAL HERNIA REPAIR     LESION REMOVAL N/A 05/22/2022   Procedure: EXCISION UPPER BACK SEBACEOUS CYST;  Surgeon: [name], MD;  Location: [facility];  Service: General;  Laterality: N/A;  LOCAL ANESTHESIA   venous ligation     for varices left lower leg   Patient Active Problem List   Diagnosis Date Noted   Sciatica of left side associated with disorder of lumbar spine 02/22/2024   Abnormality of gait 06/26/2021   Chronic hoarseness 10/04/2020   PAF (paroxysmal atrial fibrillation) (HCC)    DDD (degenerative disc disease), lumbar 10/28/2017   Bilateral bunions 08/20/2016   Varicose veins of right lower extremity with complications 01/07/2016   Bruit 10/10/2015   Varicose veins of leg with complications 10/01/2015    Ankylosing spondylitis (HCC) 01/14/2015   Chronic anticoagulation 01/14/2015   Sleep apnea 01/14/2015   S/P ablation of atrial fibrillation 01/08/2015   Persistent atrial fibrillation (HCC) 01/08/2015   Orthostatic dizziness 12/01/2013   Calf pain 11/22/2012   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/22/2012   Peripheral neuropathy 10/21/2012   Pain in joint, ankle and foot 09/21/2012   Disturbance of skin sensation 09/21/2012   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 (HCC) 09/21/2012   Other general symptoms(780.99) 09/21/2012   Heel pain 06/07/2012   Plantar fasciitis 06/07/2012   Mitral valve prolapse 06/03/2011   Pain in joint of right shoulder 04/03/2011   Rotator cuff syndrome of right shoulder 04/03/2011   Paroxysmal atrial fibrillation (HCC) 01/20/2011   Hypercholesterolemia 01/20/2011   OBSTRUCTIVE SLEEP APNEA 10/08/2009    PCP: [name] MD   REFERRING PROVIDER: [name] MD/ [name] MD   REFERRING DIAG: [phone] (ICD-10-CM) - Left sided sciatica  THERAPY DIAG:  Other abnormalities of gait and mobility  Muscle weakness (generalized)  Abnormal posture  Pain in left leg  Rationale for Evaluation and Treatment: Rehabilitation  ONSET DATE: acute on chronic   SUBJECTIVE:   SUBJECTIVE STATEMENT: I stopped doing my back stretches and I noticed it my back.  I have been doing those stretches for 50--60  yrs. Can we review those exercises again.    PERTINENT HISTORY: Dr. Harvey note  had sent him to neuro PT because of what I saw is a high fall risk No recent falls but he feels unsteady at times and even has had some retropulsion Today he has dynamic genu valgus on the right with hip abduction weakness I gave him a hip abduction series but want him to pursue this at physical therapy until we can get improved right hip strength  Patient saw Dr. Teressa 2 weeks ago for worsening sciatica that left leg.  He has a progressive peripheral neuropathy.  He has  a diagnosis that started when he was young of ankylosing spondylitis.  Is been thought that his radicular symptoms probably originate in his lumbar spine.   PAIN:  Are you having pain? Yes: NPRS scale: early AM 3/10- 4/10   Pain location: L thigh, buttock  Pain description: aching  Aggravating factors: standing too long (> 30) , sitting too long with knee flexed , can be 8/10  Relieving factors: gabapentin , stretching , moving , figure 4 stretch    PRECAUTIONS: None monitor balance  RED FLAGS: None   WEIGHT BEARING RESTRICTIONS: No  FALLS:  Has patient fallen in last 6 months? No but does note worsening balance lately.   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  PATIENT GOALS: To be rid of the pain   NEXT MD VISIT: as needed   OBJECTIVE:   Note: Objective measures were completed at Evaluation unless otherwise noted.  DIAGNOSTIC FINDINGS: none recent   PATIENT SURVEYS: NT     COGNITION: Overall cognitive status: Within functional limits for tasks assessed     SENSATION: Major numbness both legs, bottom 1/3 of my legs and feet  EDEMA:  None   MUSCLE LENGTH: Hamstrings: lacks about 10 deg each leg.  Rt LE SLR 55 and Lt. 45 deg  Thomas test: NT   POSTURE: rounded shoulders, forward head, increased thoracic kyphosis, posterior pelvic tilt, and flexed trunk   PALPATION: Pain to palpation of left piriformis but only minimal to moderate No pain in the left hamstring or calf  LOWER EXTREMITY ROM: Within functional limits Patient notices a greater sense of tightness in the left hamstring with passive stretching  Active ROM Right eval Left eval  Hip flexion    Hip extension    Hip abduction    Hip  adduction    Hip internal rotation    Hip external rotation    Knee flexion    Knee extension    Ankle dorsiflexion    Ankle plantarflexion    Ankle inversion    Ankle eversion     (Blank rows = not tested)  LOWER EXTREMITY MMT:  MMT Right eval Left eval  Hip flexion 5 5  Hip extension 4 4  Hip abduction 3+ 4  Hip adduction    Hip internal rotation    Hip external rotation    Knee flexion 5 4+  Knee extension 5 4+  Ankle dorsiflexion    Ankle plantarflexion    Ankle inversion    Ankle eversion     (Blank rows = not tested)  LOWER EXTREMITY SPECIAL TESTS:  NT   FUNCTIONAL TESTS:  5 times sit to stand: 17 sec  Needs UE assit to balance on one leg , hip compensation, trunk lean   GAIT: Distance walked: 150 Assistive device utilized: None Level of assistance: Modified independence Comments: Trunk flexed upper back rounded and shoulders forward.  Apparent decrease in proprioception in the lower body with decreased hip stability lack of arm swing and right foot slight lead turn and word                                                                                                                                TREATMENT DATE:    [facility] Adult PT Treatment:                                                DATE: 03/28/24 Therapeutic Exercise: Hamstring/ITB  3 x 30 sec  Figure 4 press out and hold 30 sec x 3 Lower trunk rotation Bridges (figure 4) with a 5-second hold Therapeutic Activity: Hip extension GTB Hip abduction  GTB March GTB  Lateral stepping GTB Corner stretch and doorway for posture  Extension (Shoulder) GTB x 10 Extension with march  Self care: tennis ball for piriformis L side, sciatic nerve   [facility] Adult PT Treatment:                                                DATE: 03/17/24 Therapeutic Exercise: Slantboard 1 min  Calf stretch off step limited  Slow prance off step  Knee to chest  LTR  Hamstring/calf stretch purple power cord Bridge x 15, 5  sec hold  Hip abduction with cues for technique  Figure 4   Therapeutic Activity: Step up with opp knee lift - 2 sets   [facility] Adult PT Treatment:                                                DATE: 03/14/24  Therapeutic Exercise: Hamstring stretch 3 x 30 sec  Figure 4 press out and hold 30 sec x 3 Lower trunk rotation Bridges (figure 4) with a 5-second hold herapeutic Activity: 2 min walk test 506 feet no increased pain , cues for posture and safety Standing SL balance UE assist on countertop  Sidestepping with GTB x 1 min x 2 rounds  Hip abduction and extension GTB x 10 each LE  Sit to stand with 10 lbs  x 10 Added chest press 10 lbs x 10  Seated hamstring stretch  Self Care: Pt verbalized his  HEP routine per his request   Taking time to hold a stretch  Gentle motions of the spine (vs hard and fast cervical stretching)     PATIENT EDUCATION:  Education details: See above self-care Person educated: Patient Education method: Explanation, Demonstration, Tactile cues, Verbal cues, and Handouts Education comprehension: verbalized understanding and needs further education  HOME EXERCISE PROGRAM: Access Code: [email or web address] URL: [email or web address] Date: 03/14/2024 Prepared by: Delon Norma  Exercises - Sidelying Hip Abduction  - 1 x daily - 7 x weekly - 3 sets - 10 reps - Sidestepping  - 1 x daily - 7 x weekly - 3 sets - 10 reps - Single Leg Stance with Support  - 1 x daily - 7 x weekly - 1 sets - 3-5 reps - 30 hold - Standing Hip Abduction with Resistance at Ankles and Counter Support  - 1 x daily - 7 x weekly - 2 sets - 10 reps - 5 hold - Hip Extension with Resistance Loop  - 1 x daily - 7 x weekly - 2 sets - 10 reps - 5 hold    ASSESSMENT:  CLINICAL IMPRESSION: Patient focused on strength last week and did only the HEP for lateral hip and noticed more back stiffness, discomfort.  He does have a wide variety of exercises which he reports takes up to an hour  most days.  In effort to streamline, we are still trying to get to the most effective exercises for him.  We integrated balance, posture and core work today into his hip exercises.  Some exercises increase his sciatic type pain.  I do think he has such weakness in lateral hip that the L side may take on more strain. He also speaks of his AS as being resolved. He cont to need cues for proper posterior chain engagement with exercises,  sidelying hip work needs hands on assist. Patient will continue to benefit from skilled PT in order to return to PLOF and optimize functional mobility.   jenass   Patient had increase in pain in his LLE with stretching calf and then performing heel raises.  Pain increased to about 6/10 from a 3/10. He was unable to effectively work on step ups for both balance and strength due to pain despite modifications.  The pain did resolve toward the end of the session when we focused on supine stretching and hip mobility.  He does feel like he is making some progress but the pain is easily exacerbated with standing . The patient will continue to benefit from skilled PT in order to return to PLOF and optimize functional mobility.      Patient was able to demonstrate his home routine, accepted some feedback about streamlining his home program.  Focus on hip abduction and extension strength, pelvic stability.  Patient will benefit from continued PT to address gait and balance as well.   OBJECTIVE IMPAIRMENTS: Abnormal gait, decreased balance, decreased coordination, decreased knowledge of condition, decreased mobility, difficulty walking, decreased strength, increased fascial restrictions, impaired flexibility, impaired UE functional use, improper body mechanics, postural dysfunction, and pain.   ACTIVITY LIMITATIONS: lifting, standing, stairs, transfers, and locomotion level  PARTICIPATION LIMITATIONS: community activity and occupation  PERSONAL FACTORS: Age, Time since onset of  injury/illness/exacerbation, and 3+ comorbidities: AS, A-fib, Neuropathy are also affecting patient's functional outcome.   REHAB POTENTIAL: Excellent  CLINICAL DECISION MAKING: Evolving/moderate complexity  EVALUATION COMPLEXITY: Moderate   GOALS: Goals reviewed with patient? Yes  SHORT TERM GOALS: Target date: 04/07/2024  Patient will be able to show independence for initial HEP to include posture, core and hip strength and stability.   Baseline: Goal status:MET   2.  Patient will be complete functional mobility testing/balance and goal set  Baseline:  Goal status:MET   3.  Pt will modify workout schedule to a strength focus Baseline:  Goal status:MET    LONG TERM GOALS: Target date: 05/05/2024    Patient will be independent with final HEP upon discharge from PT and report consistent benefit following exercise completion.    Baseline:  Goal status:ongoing   2.  Pt will be able to demonstrate good standing balance as evidenced by balance recovery from min to mod dynamic balance challenges.   Baseline:  Goal status: ongoing   3.  Patient will be able to demonstrate hip/knee/ankle strength to 5/5 in order to maximize functional mobility, ambulation and lifting.   Baseline:  Goal status:ongoing   4.  Patient will be able to perform sit to stand  x 10 in  30 sec Baseline: 18 sec x 5  Goal status: INITIAL  5.  Patient will feel more confident and stable in his balance when at church Baseline:  Goal status: INITIAL  6.  Pt will be abel to stand for 30 min without increased LLE pain.  Baseline:  Goal status: INITIAL   PLAN:  PT FREQUENCY: 1-2x/week  PT DURATION: 8 weeks  PLANNED INTERVENTIONS: 97164- PT Re-evaluation, 97750- Physical Performance Testing, 97110-Therapeutic exercises, 97530- Therapeutic activity, 97112- Neuromuscular re-education, 97535- Self Care, [id number]- Manual therapy, [phone]- Gait training, [phone]- Aquatic Therapy, [phone] (1-2 muscles), [phone] (3+  muscles)- Dry Needling, Patient/Family education, Balance training, Taping, Spinal mobilization, Cryotherapy, and Moist heat  PLAN FOR NEXT SESSION:  balance test , DGI    Merwyn Hodapp, PT 03/28/2024, 6:50 PM   Delon Norma, PT 03/28/24 6:50 PM Phone: [phone] Fax: [phone]

## 2024-03-28 ENCOUNTER — Encounter: Payer: Self-pay | Admitting: Physical Therapy

## 2024-03-28 ENCOUNTER — Ambulatory Visit: Attending: Family Medicine | Admitting: Physical Therapy

## 2024-03-28 DIAGNOSIS — R293 Abnormal posture: Secondary | ICD-10-CM | POA: Insufficient documentation

## 2024-03-28 DIAGNOSIS — M6281 Muscle weakness (generalized): Secondary | ICD-10-CM | POA: Insufficient documentation

## 2024-03-28 DIAGNOSIS — M79605 Pain in left leg: Secondary | ICD-10-CM | POA: Diagnosis not present

## 2024-03-28 DIAGNOSIS — R2689 Other abnormalities of gait and mobility: Secondary | ICD-10-CM | POA: Diagnosis not present

## 2024-03-30 ENCOUNTER — Ambulatory Visit: Admitting: Physical Therapy

## 2024-03-30 ENCOUNTER — Encounter: Payer: Self-pay | Admitting: Physical Therapy

## 2024-03-30 DIAGNOSIS — M6281 Muscle weakness (generalized): Secondary | ICD-10-CM

## 2024-03-30 DIAGNOSIS — M79605 Pain in left leg: Secondary | ICD-10-CM

## 2024-03-30 DIAGNOSIS — R2689 Other abnormalities of gait and mobility: Secondary | ICD-10-CM | POA: Diagnosis not present

## 2024-03-30 DIAGNOSIS — R293 Abnormal posture: Secondary | ICD-10-CM

## 2024-03-30 NOTE — Therapy (Signed)
 OUTPATIENT PHYSICAL THERAPY LOWER EXTREMITY NOTE    Patient Name: Wesley [name]. MRN: [id number] DOB:Dec 01, 1940, 83 y.o., male Today's Date: 03/30/2024  END OF SESSION:  PT End of Session - 03/30/24 0855     Visit Number 5    Number of Visits 12    Date for PT Re-Evaluation 05/05/24    Authorization Type Medicare, [employer]    PT Start Time [phone]    PT Stop Time 0930    PT Time Calculation (min) 43 min    Activity Tolerance Patient tolerated treatment well    Behavior During Therapy WFL for tasks assessed/performed            Past Medical History:  Diagnosis Date   A-fib (HCC)    cardioversion x2   Ankylosing spondylitis (HCC)    Atrial flutter (HCC)    GERD (gastroesophageal reflux disease)    Hemorrhoids    Irregular heart beat    MVP (mitral valve prolapse)    WITH A MIDSYSTOLIC CLICK   OSA (obstructive sleep apnea)    uses CPAP nightly   Peripheral neuropathy    Vitamin B12 deficiency    Past Surgical History:  Procedure Laterality Date   ABLATION     CARDIOVERSION N/A 12/20/2019   Procedure: CARDIOVERSION;  Surgeon: [name], MD;  Location: [facility];  Service: Cardiovascular;  Laterality: N/A;   COLONOSCOPY     INGUINAL HERNIA REPAIR     LESION REMOVAL N/A 05/22/2022   Procedure: EXCISION UPPER BACK SEBACEOUS CYST;  Surgeon: [name], MD;  Location: [facility];  Service: General;  Laterality: N/A;  LOCAL ANESTHESIA   venous ligation     for varices left lower leg   Patient Active Problem List   Diagnosis Date Noted   Sciatica of left side associated with disorder of lumbar spine 02/22/2024   Abnormality of gait 06/26/2021   Chronic hoarseness 10/04/2020   PAF (paroxysmal atrial fibrillation) (HCC)    DDD (degenerative disc disease), lumbar 10/28/2017   Bilateral bunions 08/20/2016   Varicose veins of right lower extremity with complications 01/07/2016   Bruit 10/10/2015   Varicose veins of leg with complications  10/01/2015   Ankylosing spondylitis (HCC) 01/14/2015   Chronic anticoagulation 01/14/2015   Sleep apnea 01/14/2015   S/P ablation of atrial fibrillation 01/08/2015   Persistent atrial fibrillation (HCC) 01/08/2015   Orthostatic dizziness 12/01/2013   Calf pain 11/22/2012   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/22/2012   Peripheral neuropathy 10/21/2012   Pain in joint, ankle and foot 09/21/2012   Disturbance of skin sensation 09/21/2012   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 (HCC) 09/21/2012   Other general symptoms(780.99) 09/21/2012   Heel pain 06/07/2012   Plantar fasciitis 06/07/2012   Mitral valve prolapse 06/03/2011   Pain in joint of right shoulder 04/03/2011   Rotator cuff syndrome of right shoulder 04/03/2011   Paroxysmal atrial fibrillation (HCC) 01/20/2011   Hypercholesterolemia 01/20/2011   OBSTRUCTIVE SLEEP APNEA 10/08/2009    PCP: [name] MD   REFERRING PROVIDER: [name] MD/ [name] MD   REFERRING DIAG: [phone] (ICD-10-CM) - Left sided sciatica  THERAPY DIAG:  Other abnormalities of gait and mobility  Muscle weakness (generalized)  Abnormal posture  Pain in left leg  Rationale for Evaluation and Treatment: Rehabilitation  ONSET DATE: acute on chronic   SUBJECTIVE:   SUBJECTIVE STATEMENT: Going to the gym after this.   PERTINENT HISTORY: Dr. Harvey note  had sent him to neuro PT  because of what I saw is a high fall risk No recent falls but he feels unsteady at times and even has had some retropulsion Today he has dynamic genu valgus on the right with hip abduction weakness I gave him a hip abduction series but want him to pursue this at physical therapy until we can get improved right hip strength  Patient saw Dr. Teressa 2 weeks ago for worsening sciatica that left leg.  He has a progressive peripheral neuropathy.  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  Is been thought that his radicular  symptoms probably originate in his lumbar spine.   PAIN:  Are you having pain? Yes: NPRS scale: early AM 3/10- 4/10   Pain location: L thigh, buttock  Pain description: aching  Aggravating factors: standing too long (> 30) , sitting too long with knee flexed , can be 8/10  Relieving factors: gabapentin , stretching , moving , figure 4 stretch    PRECAUTIONS: None monitor balance  RED FLAGS: None   WEIGHT BEARING RESTRICTIONS: No  FALLS:  Has patient fallen in last 6 months? No but does note worsening balance lately.   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  PATIENT GOALS: To be rid of the pain   NEXT MD VISIT: as needed   OBJECTIVE:   Note: Objective measures were completed at Evaluation unless otherwise noted.  DIAGNOSTIC FINDINGS: none recent   PATIENT SURVEYS: NT     COGNITION: Overall cognitive status: Within functional limits for tasks assessed     SENSATION: Major numbness both legs, bottom 1/3 of my legs and feet  EDEMA:  None   MUSCLE LENGTH: Hamstrings: lacks about 10 deg each leg.  Rt Wesley SLR 55 and Lt. 45 deg  Thomas test: NT   POSTURE: rounded shoulders, forward head, increased thoracic kyphosis, posterior pelvic tilt, and flexed trunk   PALPATION: Pain to palpation of left piriformis but only minimal to moderate No pain in the left hamstring or calf  LOWER EXTREMITY ROM: Within functional limits Patient notices a greater sense of tightness in the left hamstring with passive stretching  Active ROM Right eval Left eval  Hip flexion    Hip extension    Hip abduction    Hip adduction    Hip internal rotation    Hip external rotation    Knee flexion    Knee extension    Ankle  dorsiflexion    Ankle plantarflexion    Ankle inversion    Ankle eversion     (Blank rows = not tested)  LOWER EXTREMITY MMT:  MMT Right eval Left eval  Hip flexion 5 5  Hip extension 4 4  Hip abduction 3+ 4  Hip adduction    Hip internal rotation    Hip external rotation    Knee flexion 5 4+  Knee extension 5 4+  Ankle dorsiflexion    Ankle plantarflexion    Ankle inversion    Ankle eversion     (Blank rows = not tested)  LOWER EXTREMITY SPECIAL TESTS:  NT   FUNCTIONAL TESTS:  5 times sit to stand: 17 sec  Needs UE assit to balance on one leg ,  hip compensation, trunk lean   GAIT: Distance walked: 150 Assistive device utilized: None Level of assistance: Modified independence Comments: Trunk flexed upper back rounded and shoulders forward.  Apparent decrease in proprioception in the lower body with decreased hip stability lack of arm swing and right foot slight lead turn and word                                                                                                                                TREATMENT DATE:   [facility] Adult PT Treatment:                                                DATE: 03/30/24 Therapeutic Exercise: NuStep L7 UE and Wesley for 6 min , increase in LLE pain  Calf stretch on slant board  Trunk L stretch  x 3 Stretch outer hips, knee to chest with rotation Bridge with ball  LTR with ball  Bridge with hamstring curl x 5   Stretch Hamstring and ITB  Neuromuscular re-ed: Supine pelvis tilt over dynadisc  Added abdominal bracing with PPT  March  Knee and hip extension, LLE painful 5/10  Clam unilateral and bilateral with dynadisc   [facility] Adult PT Treatment:                                                DATE: 03/28/24 Therapeutic Exercise: Hamstring/ITB  3 x 30 sec  Figure 4 press out and hold 30 sec x 3 Lower trunk rotation Bridges (figure 4) with a 5-second hold Therapeutic Activity: Hip extension GTB Hip abduction  GTB March GTB   Lateral stepping GTB Corner stretch and doorway for posture  Extension (Shoulder) GTB x 10 Extension with march  Self care: tennis ball for piriformis L side, sciatic nerve   [facility] Adult PT Treatment:                                                DATE: 03/17/24 Therapeutic Exercise: Slantboard 1 min  Calf stretch off step limited  Slow prance off step  Knee to chest  LTR  Hamstring/calf stretch purple power cord Bridge x 15, 5 sec hold  Hip abduction with cues for technique  Figure 4   Therapeutic Activity: Step up with opp knee lift - 2 sets   [facility] Adult PT Treatment:  DATE: 03/14/24  Therapeutic Exercise: Hamstring stretch 3 x 30 sec  Figure 4 press out and hold 30 sec x 3 Lower trunk rotation Bridges (figure 4) with a 5-second hold herapeutic Activity: 2 min walk test 506 feet no increased pain , cues for posture and safety Standing SL balance UE assist on countertop  Sidestepping with GTB x 1 min x 2 rounds  Hip abduction and extension GTB x 10 each Wesley  Sit to stand with 10 lbs  x 10 Added chest press 10 lbs x 10  Seated hamstring stretch  Self Care: Pt verbalized his HEP routine per his request   Taking time to hold a stretch  Gentle motions of the spine (vs hard and fast cervical stretching)     PATIENT EDUCATION:  Education details: See above self-care Person educated: Patient Education method: Explanation, Demonstration, Tactile cues, Verbal cues, and Handouts Education comprehension: verbalized understanding and needs further education  HOME EXERCISE PROGRAM: Access Code: [email or web address] URL: [email or web address] Date: 03/14/2024 Prepared by: Delon Norma  Exercises - Sidelying Hip Abduction  - 1 x daily - 7 x weekly - 3 sets - 10 reps - Sidestepping  - 1 x daily - 7 x weekly - 3 sets - 10 reps - Single Leg Stance with Support  - 1 x daily - 7 x weekly - 1 sets - 3-5 reps - 30 hold - Standing Hip  Abduction with Resistance at Ankles and Counter Support  - 1 x daily - 7 x weekly - 2 sets - 10 reps - 5 hold - Hip Extension with Resistance Loop  - 1 x daily - 7 x weekly - 2 sets - 10 reps - 5 hold    ASSESSMENT:  CLINICAL IMPRESSION:  Patient with increased pain in LLE with NuStep today which was unexpected.  He continues to have a routine that works for him regarding his flexibility, cardio and hip stretching.     Patient able to tolerate core stability exercises in supine with improved breathing with repetition. This may be a new focus going forward, he is scheduled to continue PT at [facility] with this PT.  Goals are in progress. Focus on hip abduction and extension strength, pelvic stability.  Patient will benefit from continued PT to address gait and balance as well.   OBJECTIVE IMPAIRMENTS: Abnormal gait, decreased balance, decreased coordination, decreased knowledge of condition, decreased mobility, difficulty walking, decreased strength, increased fascial restrictions, impaired flexibility, impaired UE functional use, improper body mechanics, postural dysfunction, and pain.   ACTIVITY LIMITATIONS: lifting, standing, stairs, transfers, and locomotion level  PARTICIPATION LIMITATIONS: community activity and occupation  PERSONAL FACTORS: Age, Time since onset of injury/illness/exacerbation, and 3+ comorbidities: AS, A-fib, Neuropathy are also affecting patient's functional outcome.   REHAB POTENTIAL: Excellent  CLINICAL DECISION MAKING: Evolving/moderate complexity  EVALUATION COMPLEXITY: Moderate   GOALS: Goals reviewed with patient? Yes  SHORT TERM GOALS: Target date: 04/07/2024   Patient will be able to show independence for initial HEP to include posture, core and hip strength and stability.   Baseline: Goal status:MET   2.  Patient will be complete functional mobility testing/balance and goal set  Baseline:  Goal status:MET   3.  Pt will modify workout  schedule to a strength focus Baseline:  Goal status:MET    LONG TERM GOALS: Target date: 05/05/2024    Patient will be independent with final HEP upon discharge from PT and report consistent benefit following exercise completion.    Baseline:  Goal status: ongoing   2.  Pt will be able to demonstrate good standing balance as evidenced by balance recovery from min to mod dynamic balance challenges.   Baseline:  Goal status: ongoing   3.  Patient will be able to demonstrate hip/knee/ankle strength to 5/5 in order to maximize functional mobility, ambulation and lifting.   Baseline:  Goal status:ongoing   4.  Patient will be able to perform sit to stand  x 10 in 30 sec Baseline: 18 sec x 5  Goal status: INITIAL  5.  Patient will feel more confident and stable in his balance when at church Baseline:  Goal status: INITIAL  6.  Pt will be abel to stand for 30 min without increased LLE pain.  Baseline:  Goal status: INITIAL   PLAN:  PT FREQUENCY: 1-2x/week  PT DURATION: 8 weeks  PLANNED INTERVENTIONS: 97164- PT Re-evaluation, 97750- Physical Performance Testing, 97110-Therapeutic exercises, 97530- Therapeutic activity, [id number]- Neuromuscular re-education, 97535- Self Care, [id number]- Manual therapy, [id number]- Gait training, [phone]- Aquatic Therapy, [phone] (1-2 muscles), [id number] (3+ muscles)- Dry Needling, Patient/Family education, Balance training, Taping, Spinal mobilization, Cryotherapy, and Moist heat  PLAN FOR NEXT SESSION:  balance test , DGI , core.    Johnaton Sonneborn, PT 03/30/2024, 9:39 AM   Delon Norma, PT 03/30/24 9:39 AM Phone: [phone] Fax: [phone]

## 2024-04-04 ENCOUNTER — Ambulatory Visit (INDEPENDENT_AMBULATORY_CARE_PROVIDER_SITE_OTHER): Admitting: Sports Medicine

## 2024-04-04 VITALS — BP 96/58 | Ht 75.0 in | Wt 208.0 lb

## 2024-04-04 DIAGNOSIS — M5386 Other specified dorsopathies, lumbar region: Secondary | ICD-10-CM | POA: Diagnosis not present

## 2024-04-04 NOTE — Progress Notes (Signed)
 Chief complaint follow-up of gait and hip weakness  Patient has been to physical therapy and continues to work on the strength to his left leg. He has significant hip abduction weakness.  With walking he has dynamic genu valgus of the left knee and some on the right. He was getting too much pronation particular on his right side and a scaphoid pad has helped lessen that somewhat. His issue is significant peripheral neuropathy which seems to be progressive on the left. We increased his gabapentin  at nighttime to 600 and he said he could clearly see some benefit in that. He is having no significant side effects with the gabapentin  although if he takes it during the daytime he will occasionally have some drowsiness.  His father had significant ankylosing spondylitis and he was diagnosed with this early in life but it does not seem to have progressed that much.  Nevertheless he still has some spinal stiffness and has progressively shown more scoliotic change that also affects his gait.  Physical exam Pleasant white male in no acute distress BP (!) 96/58   Ht 6' 3 (1.905 m)   Wt 208 lb (94.3 kg)   BMI 26.00 kg/m   Patient has full range of motion at both hips Left hip shows significant hip abduction weakness that is not significantly changed Right hip shows some mild hip abduction weakness Both hips have good flexion strength However he does have difficulty on a sit to stand from an office height chair  1 foot balance is very limited and he can only stand for a few seconds before needing to stabilize  Walking gait shows significant genu valgus that is dynamic bilaterally There is some improvement after his physical therapy sessions and his stability when he is walking His trunk is tending to curve and lean toward the right and he has a tendency to go into some kyphosis

## 2024-04-04 NOTE — Assessment & Plan Note (Signed)
 Patient continues with physical therapy which I believe is helping somewhat but he has a long way to go to improve his gait and particularly his left hip abduction strength We discussed keeping up around 7000 steps per day He can use a hiking stick if he needs for balance Keep up physical therapy  Since gabapentin  seems the only thing is helped his peripheral neuropathy symptoms he can experiment with increasing it to [phone] 300 on his 4 times daily schedule  Recheck in 2 months

## 2024-04-04 NOTE — Therapy (Unsigned)
 OUTPATIENT PHYSICAL THERAPY LOWER EXTREMITY NOTE    Patient Name: Wesley [name]. MRN: [id number] DOB:1941/07/01, 83 y.o., male Today's Date: 04/04/2024  END OF SESSION:      Past Medical History:  Diagnosis Date   A-fib [facility])    cardioversion x2   Ankylosing spondylitis (HCC)    Atrial flutter (HCC)    GERD (gastroesophageal reflux disease)    Hemorrhoids    Irregular heart beat    MVP (mitral valve prolapse)    WITH A MIDSYSTOLIC CLICK   OSA (obstructive sleep apnea)    uses CPAP nightly   Peripheral neuropathy    Vitamin B12 deficiency    Past Surgical History:  Procedure Laterality Date   ABLATION     CARDIOVERSION N/A 12/20/2019   Procedure: CARDIOVERSION;  Surgeon: [name], MD;  Location: [facility];  Service: Cardiovascular;  Laterality: N/A;   COLONOSCOPY     INGUINAL HERNIA REPAIR     LESION REMOVAL N/A 05/22/2022   Procedure: EXCISION UPPER BACK SEBACEOUS CYST;  Surgeon: [name], MD;  Location:  SURGERY CENTER;  Service: General;  Laterality: N/A;  LOCAL ANESTHESIA   venous ligation     for varices left lower leg   Patient Active Problem List   Diagnosis Date Noted   Sciatica of left side associated with disorder of lumbar spine 02/22/2024   Abnormality of gait 06/26/2021   Chronic hoarseness 10/04/2020   PAF (paroxysmal atrial fibrillation) (HCC)    DDD (degenerative disc disease), lumbar 10/28/2017   Bilateral bunions 08/20/2016   Varicose veins of right lower extremity with complications 01/07/2016   Bruit 10/10/2015   Varicose veins of leg with complications 10/01/2015   Ankylosing spondylitis (HCC) 01/14/2015   Chronic anticoagulation 01/14/2015   Sleep apnea 01/14/2015   S/P ablation of atrial fibrillation 01/08/2015   Persistent atrial fibrillation (HCC) 01/08/2015   Orthostatic dizziness 12/01/2013   Calf pain 11/22/2012   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/22/2012   Peripheral neuropathy 10/21/2012   Pain in joint, ankle  and foot 09/21/2012   Disturbance of skin sensation 09/21/2012   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 (HCC) 09/21/2012   Other general symptoms(780.99) 09/21/2012   Heel pain 06/07/2012   Plantar fasciitis 06/07/2012   Mitral valve prolapse 06/03/2011   Pain in joint of right shoulder 04/03/2011   Rotator cuff syndrome of right shoulder 04/03/2011   Paroxysmal atrial fibrillation (HCC) 01/20/2011   Hypercholesterolemia 01/20/2011   OBSTRUCTIVE SLEEP APNEA 10/08/2009    PCP: [name] MD   REFERRING PROVIDER: [name] MD/ [name] MD   REFERRING DIAG: [phone] (ICD-10-CM) - Left sided sciatica  THERAPY DIAG:  No diagnosis found.  Rationale for Evaluation and Treatment: Rehabilitation  ONSET DATE: acute on chronic   SUBJECTIVE:   SUBJECTIVE STATEMENT: Going to the gym after this.   PERTINENT HISTORY: Dr. Harvey note  had sent him to neuro PT because of what I saw is a high fall risk No recent falls but he feels unsteady at times and even has had some retropulsion Today he has dynamic genu valgus on the right with hip abduction weakness I gave him a hip abduction series but want him to pursue this at physical therapy until we can get improved right hip strength  Patient saw Dr. Teressa 2 weeks ago for worsening sciatica that left leg.  He has a progressive peripheral neuropathy.  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  Is been  thought that his radicular symptoms probably originate in his lumbar spine.   PAIN:  Are you having pain? Yes: NPRS scale: early AM 3/10- 4/10   Pain location: L thigh, buttock  Pain description: aching  Aggravating factors: standing too long (> 30) , sitting too long with knee flexed , can be 8/10  Relieving factors: gabapentin , stretching , moving , figure 4 stretch    PRECAUTIONS: None monitor balance  RED FLAGS: None   WEIGHT BEARING RESTRICTIONS: No  FALLS:  Has patient  fallen in last 6 months? No but does note worsening balance lately.   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  PATIENT GOALS: To be rid of the pain   NEXT MD VISIT: as needed   OBJECTIVE:   Note: Objective measures were completed at Evaluation unless otherwise noted.  DIAGNOSTIC FINDINGS: none recent   PATIENT SURVEYS: NT     COGNITION: Overall cognitive status: Within functional limits for tasks assessed     SENSATION: Major numbness both legs, bottom 1/3 of my legs and feet  EDEMA:  None   MUSCLE LENGTH: Hamstrings: lacks about 10 deg each leg.  Rt LE SLR 55 and Lt. 45 deg  Thomas test: NT   POSTURE: rounded shoulders, forward head, increased thoracic kyphosis, posterior pelvic tilt, and flexed trunk   PALPATION: Pain to palpation of left piriformis but only minimal to moderate No pain in the left hamstring or calf  LOWER EXTREMITY ROM: Within functional limits Patient notices a greater sense of tightness in the left hamstring with passive stretching  Active ROM Right eval Left eval  Hip flexion    Hip extension    Hip abduction    Hip adduction    Hip internal rotation    Hip external rotation    Knee flexion    Knee extension    Ankle dorsiflexion    Ankle plantarflexion    Ankle inversion    Ankle eversion     (Blank rows = not tested)  LOWER EXTREMITY MMT:  MMT Right eval Left eval  Hip flexion 5 5  Hip extension 4 4  Hip abduction 3+ 4  Hip adduction    Hip internal rotation    Hip external rotation    Knee flexion 5 4+  Knee extension 5 4+  Ankle dorsiflexion    Ankle plantarflexion    Ankle inversion    Ankle eversion     (Blank rows = not tested)  LOWER EXTREMITY  SPECIAL TESTS:  NT   FUNCTIONAL TESTS:  5 times sit to stand: 17 sec  Needs UE assit to balance on one leg , hip compensation, trunk lean   GAIT: Distance walked: 150 Assistive device utilized: None Level of assistance: Modified independence Comments: Trunk flexed upper back rounded and shoulders forward.  Apparent decrease in proprioception in the lower body with decreased hip stability lack of arm swing and right foot slight lead turn and word  TREATMENT DATE:   [facility] Adult PT Treatment:                                                DATE: 03/30/24 Therapeutic Exercise: NuStep L7 UE and LE for 6 min , increase in LLE pain  Calf stretch on slant board  Trunk L stretch  x 3 Stretch outer hips, knee to chest with rotation Bridge with ball  LTR with ball  Bridge with hamstring curl x 5   Stretch Hamstring and ITB  Neuromuscular re-ed: Supine pelvis tilt over dynadisc  Added abdominal bracing with PPT  March  Knee and hip extension, LLE painful 5/10  Clam unilateral and bilateral with dynadisc   [facility] Adult PT Treatment:                                                DATE: 03/28/24 Therapeutic Exercise: Hamstring/ITB  3 x 30 sec  Figure 4 press out and hold 30 sec x 3 Lower trunk rotation Bridges (figure 4) with a 5-second hold Therapeutic Activity: Hip extension GTB Hip abduction  GTB March GTB  Lateral stepping GTB Corner stretch and doorway for posture  Extension (Shoulder) GTB x 10 Extension with march  Self care: tennis ball for piriformis L side, sciatic nerve   [facility] Adult PT Treatment:                                                DATE: 03/17/24 Therapeutic Exercise: Slantboard 1 min  Calf stretch off step limited  Slow prance off step  Knee to chest  LTR  Hamstring/calf stretch purple power cord Bridge x 15, 5 sec hold  Hip abduction  with cues for technique  Figure 4   Therapeutic Activity: Step up with opp knee lift - 2 sets   [facility] Adult PT Treatment:                                                DATE: 03/14/24  Therapeutic Exercise: Hamstring stretch 3 x 30 sec  Figure 4 press out and hold 30 sec x 3 Lower trunk rotation Bridges (figure 4) with a 5-second hold herapeutic Activity: 2 min walk test 506 feet no increased pain , cues for posture and safety Standing SL balance UE assist on countertop  Sidestepping with GTB x 1 min x 2 rounds  Hip abduction and extension GTB x 10 each LE  Sit to stand with 10 lbs  x 10 Added chest press 10 lbs x 10  Seated hamstring stretch  Self Care: Pt verbalized his HEP routine per his request   Taking time to hold a stretch  Gentle motions of the spine (vs hard and fast cervical stretching)     PATIENT EDUCATION:  Education details: See above self-care Person educated: Patient Education method: Explanation, Demonstration, Tactile cues, Verbal cues, and Handouts Education comprehension: verbalized understanding and needs further education  HOME EXERCISE PROGRAM:  Access Code: [email or web address] URL: [email or web address] Date: 03/14/2024 Prepared by: Wesley [name]  Exercises - Sidelying Hip Abduction  - 1 x daily - 7 x weekly - 3 sets - 10 reps - Sidestepping  - 1 x daily - 7 x weekly - 3 sets - 10 reps - Single Leg Stance with Support  - 1 x daily - 7 x weekly - 1 sets - 3-5 reps - 30 hold - Standing Hip Abduction with Resistance at Ankles and Counter Support  - 1 x daily - 7 x weekly - 2 sets - 10 reps - 5 hold - Hip Extension with Resistance Loop  - 1 x daily - 7 x weekly - 2 sets - 10 reps - 5 hold    ASSESSMENT:  CLINICAL IMPRESSION:  Patient with increased pain in LLE with NuStep today which was unexpected.  He continues to have a routine that works for him regarding his flexibility, cardio and hip stretching.     Patient able to tolerate core  stability exercises in supine with improved breathing with repetition. This may be a new focus going forward, he is scheduled to continue PT at [facility] with this PT.  Goals are in progress. Focus on hip abduction and extension strength, pelvic stability.  Patient will benefit from continued PT to address gait and balance as well.   OBJECTIVE IMPAIRMENTS: Abnormal gait, decreased balance, decreased coordination, decreased knowledge of condition, decreased mobility, difficulty walking, decreased strength, increased fascial restrictions, impaired flexibility, impaired UE functional use, improper body mechanics, postural dysfunction, and pain.   ACTIVITY LIMITATIONS: lifting, standing, stairs, transfers, and locomotion level  PARTICIPATION LIMITATIONS: community activity and occupation  PERSONAL FACTORS: Age, Time since onset of injury/illness/exacerbation, and 3+ comorbidities: AS, A-fib, Neuropathy are also affecting patient's functional outcome.   REHAB POTENTIAL: Excellent  CLINICAL DECISION MAKING: Evolving/moderate complexity  EVALUATION COMPLEXITY: Moderate   GOALS: Goals reviewed with patient? Yes  SHORT TERM GOALS: Target date: 04/07/2024   Patient will be able to show independence for initial HEP to include posture, core and hip strength and stability.   Baseline: Goal status:MET   2.  Patient will be complete functional mobility testing/balance and goal set  Baseline:  Goal status:MET   3.  Pt will modify workout schedule to a strength focus Baseline:  Goal status:MET    LONG TERM GOALS: Target date: 05/05/2024    Patient will be independent with final HEP upon discharge from PT and report consistent benefit following exercise completion.    Baseline:  Goal status: ongoing   2.  Pt will be able to demonstrate good standing balance as evidenced by balance recovery from min to mod dynamic balance challenges.   Baseline:  Goal status: ongoing   3.  Patient  will be able to demonstrate hip/knee/ankle strength to 5/5 in order to maximize functional mobility, ambulation and lifting.   Baseline:  Goal status:ongoing   4.  Patient will be able to perform sit to stand  x 10 in 30 sec Baseline: 18 sec x 5  Goal status: INITIAL  5.  Patient will feel more confident and stable in his balance when at church Baseline:  Goal status: INITIAL  6.  Pt will be abel to stand for 30 min without increased LLE pain.  Baseline:  Goal status: INITIAL   PLAN:  PT FREQUENCY: 1-2x/week  PT DURATION: 8 weeks  PLANNED INTERVENTIONS: 97164- PT Re-evaluation, 97750- Physical Performance Testing, 97110-Therapeutic exercises, 97530- Therapeutic activity, [id number]- Neuromuscular re-education,  02464- Self Care, 02859- Manual therapy, [id number]- Gait training, [id number]- Aquatic Therapy, [phone] (1-2 muscles), 20561 (3+ muscles)- Dry Needling, Patient/Family education, Balance training, Taping, Spinal mobilization, Cryotherapy, and Moist heat  PLAN FOR NEXT SESSION:  balance test , DGI , core.    Seng Larch, PT 04/04/2024, 4:02 PM   Wesley [name], PT 04/04/24 4:02 PM Phone: [phone] Fax: [phone]

## 2024-04-05 ENCOUNTER — Encounter: Payer: Self-pay | Admitting: Physical Therapy

## 2024-04-05 ENCOUNTER — Ambulatory Visit (INDEPENDENT_AMBULATORY_CARE_PROVIDER_SITE_OTHER): Admitting: Physical Therapy

## 2024-04-05 DIAGNOSIS — M6281 Muscle weakness (generalized): Secondary | ICD-10-CM | POA: Diagnosis not present

## 2024-04-05 DIAGNOSIS — R2689 Other abnormalities of gait and mobility: Secondary | ICD-10-CM | POA: Diagnosis not present

## 2024-04-05 DIAGNOSIS — R293 Abnormal posture: Secondary | ICD-10-CM | POA: Diagnosis not present

## 2024-04-05 DIAGNOSIS — M79605 Pain in left leg: Secondary | ICD-10-CM | POA: Diagnosis not present

## 2024-04-11 ENCOUNTER — Ambulatory Visit (INDEPENDENT_AMBULATORY_CARE_PROVIDER_SITE_OTHER): Admitting: Physical Therapy

## 2024-04-11 DIAGNOSIS — R293 Abnormal posture: Secondary | ICD-10-CM | POA: Diagnosis not present

## 2024-04-11 DIAGNOSIS — M79605 Pain in left leg: Secondary | ICD-10-CM

## 2024-04-11 DIAGNOSIS — M6281 Muscle weakness (generalized): Secondary | ICD-10-CM

## 2024-04-11 DIAGNOSIS — R2689 Other abnormalities of gait and mobility: Secondary | ICD-10-CM | POA: Diagnosis not present

## 2024-04-11 NOTE — Therapy (Signed)
 OUTPATIENT PHYSICAL THERAPY LOWER EXTREMITY NOTE    Patient Name: Wesley [name]. MRN: [id number] DOB:02/26/1941, 83 y.o., male Today's Date: 04/11/2024  END OF SESSION:  PT End of Session - 04/11/24 1021     Visit Number 7    Number of Visits 12    Date for PT Re-Evaluation 05/05/24    Authorization Type Medicare, [employer]    PT Start Time 1018    PT Stop Time 1100    PT Time Calculation (min) 42 min    Activity Tolerance Patient tolerated treatment well    Behavior During Therapy WFL for tasks assessed/performed             Past Medical History:  Diagnosis Date   A-fib (HCC)    cardioversion x2   Ankylosing spondylitis (HCC)    Atrial flutter (HCC)    GERD (gastroesophageal reflux disease)    Hemorrhoids    Irregular heart beat    MVP (mitral valve prolapse)    WITH A MIDSYSTOLIC CLICK   OSA (obstructive sleep apnea)    uses CPAP nightly   Peripheral neuropathy    Vitamin B12 deficiency    Past Surgical History:  Procedure Laterality Date   ABLATION     CARDIOVERSION N/A 12/20/2019   Procedure: CARDIOVERSION;  Surgeon: [name], MD;  Location: [facility];  Service: Cardiovascular;  Laterality: N/A;   COLONOSCOPY     INGUINAL HERNIA REPAIR     LESION REMOVAL N/A 05/22/2022   Procedure: EXCISION UPPER BACK SEBACEOUS CYST;  Surgeon: [name], MD;  Location: [facility];  Service: General;  Laterality: N/A;  LOCAL ANESTHESIA   venous ligation     for varices left lower leg   Patient Active Problem List   Diagnosis Date Noted   Sciatica of left side associated with disorder of lumbar spine 02/22/2024   Abnormality of gait 06/26/2021   Chronic hoarseness 10/04/2020   PAF (paroxysmal atrial fibrillation) (HCC)    DDD (degenerative disc disease), lumbar 10/28/2017   Bilateral bunions 08/20/2016   Varicose veins of right lower extremity with complications 01/07/2016   Bruit 10/10/2015   Varicose veins of leg with complications  10/01/2015   Ankylosing spondylitis (HCC) 01/14/2015   Chronic anticoagulation 01/14/2015   Sleep apnea 01/14/2015   S/P ablation of atrial fibrillation 01/08/2015   Persistent atrial fibrillation (HCC) 01/08/2015   Orthostatic dizziness 12/01/2013   Calf pain 11/22/2012   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/22/2012   Peripheral neuropathy 10/21/2012   Pain in joint, ankle and foot 09/21/2012   Disturbance of skin sensation 09/21/2012   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 (HCC) 09/21/2012   Other general symptoms(780.99) 09/21/2012   Heel pain 06/07/2012   Plantar fasciitis 06/07/2012   Mitral valve prolapse 06/03/2011   Pain in joint of right shoulder 04/03/2011   Rotator cuff syndrome of right shoulder 04/03/2011   Paroxysmal atrial fibrillation (HCC) 01/20/2011   Hypercholesterolemia 01/20/2011   OBSTRUCTIVE SLEEP APNEA 10/08/2009    PCP: [name] MD   REFERRING PROVIDER: [name] MD/ [name] MD   REFERRING DIAG: [phone] (ICD-10-CM) - Left sided sciatica  THERAPY DIAG:  Other abnormalities of gait and mobility  Muscle weakness (generalized)  Abnormal posture  Pain in left leg  Rationale for Evaluation and Treatment: Rehabilitation  ONSET DATE: acute on chronic   SUBJECTIVE:   SUBJECTIVE STATEMENT: Both calves and feet are numb it never changes .  Did his workout already.  Not hurting right  now.    PERTINENT HISTORY: Dr. Harvey note  had sent him to neuro PT because of what I saw is a high fall risk No recent falls but he feels unsteady at times and even has had some retropulsion Today he has dynamic genu valgus on the right with hip abduction weakness I gave him a hip abduction series but want him to pursue this at physical therapy until we can get improved right hip strength  Patient saw Dr. Teressa 2 weeks ago for worsening sciatica that left leg.  He has a progressive peripheral neuropathy.  He has a diagnosis that started when he was  young of ankylosing spondylitis.  Is been thought that his radicular symptoms probably originate in his lumbar spine.   PAIN:  Are you having pain? Yes: NPRS scale: early AM 3/10- 4/10 , none today   Pain location: L thigh, buttock  Pain description: aching  Aggravating factors: standing too long (> 30) , sitting too long with knee flexed , can be 8/10  Relieving factors: gabapentin , stretching , moving , figure 4 stretch    PRECAUTIONS: None monitor balance  RED FLAGS: None   WEIGHT BEARING RESTRICTIONS: No  FALLS:  Has patient fallen in last 6 months? No but does note worsening balance lately.   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  PATIENT GOALS: To be rid of the pain   NEXT MD VISIT: as needed   OBJECTIVE:   Note: Objective measures were completed at Evaluation unless otherwise noted.  DIAGNOSTIC FINDINGS: none recent   PATIENT SURVEYS: NT     COGNITION: Overall cognitive status: Within functional limits for tasks assessed     SENSATION: Major numbness both legs, bottom 1/3 of my legs and feet  EDEMA:  None   MUSCLE LENGTH: Hamstrings: lacks about 10 deg each leg.  Rt LE SLR 55 and Lt. 45 deg  Thomas test: NT   POSTURE: rounded shoulders, forward head, increased thoracic kyphosis, posterior pelvic tilt, and flexed trunk   PALPATION: Pain to palpation of left piriformis but only minimal to moderate No pain in the left hamstring or calf  LOWER EXTREMITY ROM: Within functional limits Patient notices a greater sense of tightness in the left hamstring with passive stretching  Active ROM Right eval Left eval  Hip flexion    Hip extension    Hip abduction    Hip adduction    Hip internal  rotation    Hip external rotation    Knee flexion    Knee extension    Ankle dorsiflexion    Ankle plantarflexion    Ankle inversion    Ankle eversion     (Blank rows = not tested)  LOWER EXTREMITY MMT:  MMT Right eval Left eval  Hip flexion 5 5  Hip extension 4 4  Hip abduction 3+ 4  Hip adduction    Hip internal rotation    Hip external rotation    Knee flexion 5 4+  Knee extension 5 4+  Ankle dorsiflexion    Ankle plantarflexion    Ankle inversion    Ankle eversion     (Blank rows = not tested)  LOWER EXTREMITY SPECIAL TESTS:  NT  FUNCTIONAL TESTS:  5 times sit to stand: 17 sec  Needs UE assit to balance on one leg , hip compensation, trunk lean   GAIT: Distance walked: 150 Assistive device utilized: None Level of assistance: Modified independence Comments: Trunk flexed upper back rounded and shoulders forward.  Apparent decrease in proprioception in the lower body with decreased hip stability lack of arm swing and right foot slight lead turn and word                                                                                                                                TREATMENT DATE:    [facility] Adult PT Treatment:                                                DATE: 04/11/24 Therapeutic Exercise: Open book Hip abduction Clam  Seated calf raise x 15 , 15 lbs each LE  Seated piriformis figure 4  Neuromuscular re-ed: Single leg hip hinge with UE support  Airex:head turns and nods 5 lbs anti rotation OH lift 5 lbs on Airex Cone tap, min A needed  Standing heel raise very difficulty  Front split squat (for floor transfer training) SLS activities    [facility] Adult PT Treatment:                                                DATE: 04/05/24 Therapeutic Exercise: TM 2 % with 5 min warm up  Standing SLS Standing hip abduction with and without 3 lbs cuff x 2 x 10  Standing hip extension x 15 , used Airex as well  Seated hamstring  Seated Fig 4  Lateral  walking 4 x 25 feet  Hip hinge 15 lbs x 15 , cues mod  [facility] Adult PT Treatment:                                                DATE: 03/30/24 Therapeutic Exercise: NuStep L7 UE and LE for 6 min , increase in LLE pain  Calf stretch on slant board  Trunk L stretch  x 3 Stretch outer hips, knee to chest with rotation Bridge with ball  LTR with ball  Bridge with hamstring curl x 5   Stretch Hamstring and ITB  Neuromuscular re-ed: Supine pelvis tilt over dynadisc  Added abdominal bracing with PPT  March  Knee and hip extension, LLE painful 5/10  Clam unilateral and bilateral with dynadisc   [facility] Adult PT Treatment:  DATE: 03/28/24 Therapeutic Exercise: Hamstring/ITB  3 x 30 sec  Figure 4 press out and hold 30 sec x 3 Lower trunk rotation Bridges (figure 4) with a 5-second hold Therapeutic Activity: Hip extension GTB Hip abduction  GTB March GTB  Lateral stepping GTB Corner stretch and doorway for posture  Extension (Shoulder) GTB x 10 Extension with march  Self care: tennis ball for piriformis L side, sciatic nerve   [facility] Adult PT Treatment:                                                DATE: 03/17/24 Therapeutic Exercise: Slantboard 1 min  Calf stretch off step limited  Slow prance off step  Knee to chest  LTR  Hamstring/calf stretch purple power cord Bridge x 15, 5 sec hold  Hip abduction with cues for technique  Figure 4   Therapeutic Activity: Step up with opp knee lift - 2 sets   [facility] Adult PT Treatment:                                                DATE: 03/14/24  Therapeutic Exercise: Hamstring stretch 3 x 30 sec  Figure 4 press out and hold 30 sec x 3 Lower trunk rotation Bridges (figure 4) with a 5-second hold herapeutic Activity: 2 min walk test 506 feet no increased pain , cues for posture and safety Standing SL balance UE assist on countertop  Sidestepping with GTB x 1 min x 2 rounds  Hip abduction and  extension GTB x 10 each LE  Sit to stand with 10 lbs  x 10 Added chest press 10 lbs x 10  Seated hamstring stretch  Self Care: Pt verbalized his HEP routine per his request   Taking time to hold a stretch  Gentle motions of the spine (vs hard and fast cervical stretching)     PATIENT EDUCATION:  Education details: See above self-care Person educated: Patient Education method: Explanation, Demonstration, Tactile cues, Verbal cues, and Handouts Education comprehension: verbalized understanding and needs further education  HOME EXERCISE PROGRAM: Access Code: [email or web address] URL: [email or web address] Date: 03/14/2024 Prepared by: Delon Norma  Exercises - Sidelying Hip Abduction  - 1 x daily - 7 x weekly - 3 sets - 10 reps - Sidestepping  - 1 x daily - 7 x weekly - 3 sets - 10 reps - Single Leg Stance with Support  - 1 x daily - 7 x weekly - 1 sets - 3-5 reps - 30 hold - Standing Hip Abduction with Resistance at Ankles and Counter Support  - 1 x daily - 7 x weekly - 2 sets - 10 reps - 5 hold - Hip Extension with Resistance Loop  - 1 x daily - 7 x weekly - 2 sets - 10 reps - 5 hold - calf raise    ASSESSMENT:  CLINICAL IMPRESSION:  Pt with decreased calf strength noted with balance activities today, uses UE and keeps knees bent. Pt has difficulty with SLS when standing on Rt LE more than Lt.  Unable to do cone taps without supervision, light A. He needed intermittent breaks to stretch LLE nerve pain. Patient will continue to benefit from skilled PT in order to return  to PLOF and optimize functional mobility.         OBJECTIVE IMPAIRMENTS: Abnormal gait, decreased balance, decreased coordination, decreased knowledge of condition, decreased mobility, difficulty walking, decreased strength, increased fascial restrictions, impaired flexibility, impaired UE functional use, improper body mechanics, postural dysfunction, and pain.   ACTIVITY LIMITATIONS: lifting, standing,  stairs, transfers, and locomotion level  PARTICIPATION LIMITATIONS: community activity and occupation  PERSONAL FACTORS: Age, Time since onset of injury/illness/exacerbation, and 3+ comorbidities: AS, A-fib, Neuropathy are also affecting patient's functional outcome.   REHAB POTENTIAL: Excellent  CLINICAL DECISION MAKING: Evolving/moderate complexity  EVALUATION COMPLEXITY: Moderate   GOALS: Goals reviewed with patient? Yes  SHORT TERM GOALS: Target date: 04/07/2024   Patient will be able to show independence for initial HEP to include posture, core and hip strength and stability.   Baseline: Goal status:MET   2.  Patient will be complete functional mobility testing/balance and goal set  Baseline:  Goal status:MET   3.  Pt will modify workout schedule to a strength focus Baseline:  Goal status:MET    LONG TERM GOALS: Target date: 05/05/2024    Patient will be independent with final HEP upon discharge from PT and report consistent benefit following exercise completion.    Baseline:  Goal status: ongoing   2.  Pt will be able to demonstrate good standing balance as evidenced by balance recovery from min to mod dynamic balance challenges.   Baseline:  Goal status: ongoing   3.  Patient will be able to demonstrate hip/knee/ankle strength to 5/5 in order to maximize functional mobility, ambulation and lifting.   Baseline:  Goal status:ongoing   4.  Patient will be able to perform sit to stand  x 10 in 30 sec Baseline: 18 sec x 5  Goal status: INITIAL  5.  Patient will feel more confident and stable in his balance when at church Baseline:  Goal status: INITIAL  6.  Pt will be abel to stand for 30 min without increased LLE pain.  Baseline:  Goal status: INITIAL   PLAN:  PT FREQUENCY: 1-2x/week  PT DURATION: 8 weeks   PLANNED INTERVENTIONS: [id number]- PT Re-evaluation, 97750- Physical Performance Testing, 97110-Therapeutic exercises, 97530- Therapeutic activity,  [id number]- Neuromuscular re-education, 97535- Self Care, [id number]- Manual therapy, [id number]- Gait training, [id number]- Aquatic Therapy, [phone] (1-2 muscles), 20561 (3+ muscles)- Dry Needling, Patient/Family education, Balance training, Taping, Spinal mobilization, Cryotherapy, and Moist heat  PLAN FOR NEXT SESSION:  Check goals. L balance test strength in LE, hips and core , posture   Delbra Zellars, PT 04/11/2024, 10:22 AM   Delon Norma, PT 04/11/24 10:22 AM Phone: [phone] Fax: [phone]

## 2024-04-13 ENCOUNTER — Encounter: Admitting: Physical Therapy

## 2024-04-14 ENCOUNTER — Encounter: Payer: Self-pay | Admitting: Physical Therapy

## 2024-04-14 ENCOUNTER — Ambulatory Visit (INDEPENDENT_AMBULATORY_CARE_PROVIDER_SITE_OTHER): Admitting: Physical Therapy

## 2024-04-14 DIAGNOSIS — M6281 Muscle weakness (generalized): Secondary | ICD-10-CM

## 2024-04-14 DIAGNOSIS — R2689 Other abnormalities of gait and mobility: Secondary | ICD-10-CM

## 2024-04-14 DIAGNOSIS — R293 Abnormal posture: Secondary | ICD-10-CM

## 2024-04-14 DIAGNOSIS — M79605 Pain in left leg: Secondary | ICD-10-CM | POA: Diagnosis not present

## 2024-04-14 NOTE — Therapy (Signed)
 OUTPATIENT PHYSICAL THERAPY LOWER EXTREMITY NOTE    Patient Name: Wesley [name]. MRN: [id number] DOB:[birth year], 83 y.o., male Today's Date: 04/14/2024  END OF SESSION:  PT End of Session - 04/14/24 1109     Visit Number 8    Number of Visits 12    Date for Recertification  05/05/24    Authorization Type Medicare, [employer]    PT Start Time 1104    PT Stop Time 1150    PT Time Calculation (min) 46 min    Equipment Utilized During Treatment Gait belt    Activity Tolerance Patient tolerated treatment well    Behavior During Therapy WFL for tasks assessed/performed             Past Medical History:  Diagnosis Date   A-fib (HCC)    cardioversion x2   Ankylosing spondylitis (HCC)    Atrial flutter (HCC)    GERD (gastroesophageal reflux disease)    Hemorrhoids    Irregular heart beat    MVP (mitral valve prolapse)    WITH A MIDSYSTOLIC CLICK   OSA (obstructive sleep apnea)    uses CPAP nightly   Peripheral neuropathy    Vitamin B12 deficiency    Past Surgical History:  Procedure Laterality Date   ABLATION     CARDIOVERSION N/A 12/20/2019   Procedure: CARDIOVERSION;  Surgeon: [name], MD;  Location: [facility];  Service: Cardiovascular;  Laterality: N/A;   COLONOSCOPY     INGUINAL HERNIA REPAIR     LESION REMOVAL N/A 05/22/2022   Procedure: EXCISION UPPER BACK SEBACEOUS CYST;  Surgeon: [name], MD;  Location: [facility];  Service: General;  Laterality: N/A;  LOCAL ANESTHESIA   venous ligation     for varices left lower leg   Patient Active Problem List   Diagnosis Date Noted   Sciatica of left side associated with disorder of lumbar spine 02/22/2024   Abnormality of gait 06/26/2021   Chronic hoarseness 10/04/2020   PAF (paroxysmal atrial fibrillation) (HCC)    DDD (degenerative disc disease), lumbar 10/28/2017   Bilateral bunions 08/20/2016   Varicose veins of right lower extremity with complications 01/07/2016   Bruit 10/10/2015    Varicose veins of leg with complications 10/01/2015   Ankylosing spondylitis (HCC) 01/14/2015   Chronic anticoagulation 01/14/2015   Sleep apnea 01/14/2015   S/P ablation of atrial fibrillation 01/08/2015   Persistent atrial fibrillation (HCC) 01/08/2015   Orthostatic dizziness 12/01/2013   Calf pain 11/22/2012   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/22/2012   Peripheral neuropathy 10/21/2012   Pain in joint, ankle and foot 09/21/2012   Disturbance of skin sensation 09/21/2012   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 (HCC) 09/21/2012   Other general symptoms(780.99) 09/21/2012   Heel pain 06/07/2012   Plantar fasciitis 06/07/2012   Mitral valve prolapse 06/03/2011   Pain in joint of right shoulder 04/03/2011   Rotator cuff syndrome of right shoulder 04/03/2011   Paroxysmal atrial fibrillation (HCC) 01/20/2011   Hypercholesterolemia 01/20/2011   OBSTRUCTIVE SLEEP APNEA 10/08/2009    PCP: [name] MD   REFERRING PROVIDER: [name] MD/ [name] MD   REFERRING DIAG: [phone] (ICD-10-CM) - Left sided sciatica  THERAPY DIAG:  Other abnormalities of gait and mobility  Muscle weakness (generalized)  Abnormal posture  Pain in left leg  Rationale for Evaluation and Treatment: Rehabilitation  ONSET DATE: acute on chronic   SUBJECTIVE:   SUBJECTIVE STATEMENT: Both calves and feet are numb it never changes .  Did his workout already.  Not hurting right now.    PERTINENT HISTORY: Dr. Harvey note  had sent him to neuro PT because of what I saw is a high fall risk No recent falls but he feels unsteady at times and even has had some retropulsion Today he has dynamic genu valgus on the right with hip abduction weakness I gave him a hip abduction series but want him to pursue this at physical therapy until we can get improved right hip strength  Patient saw Dr. Teressa 2 weeks ago for worsening sciatica that left leg.  He has a progressive peripheral neuropathy.   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  Is been thought that his radicular symptoms probably originate in his lumbar spine.   PAIN:  Are you having pain? Yes: NPRS scale: early AM 3/10- 4/10 , none today   Pain location: L thigh, buttock  Pain description: aching  Aggravating factors: standing too long (> 30) , sitting too long with knee flexed , can be 8/10  Relieving factors: gabapentin , stretching , moving , figure 4 stretch    PRECAUTIONS: None monitor balance  RED FLAGS: None   WEIGHT BEARING RESTRICTIONS: No  FALLS:  Has patient fallen in last 6 months? No but does note worsening balance lately.   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  PATIENT GOALS: To be rid of the pain   NEXT MD VISIT: as needed   OBJECTIVE:   Note: Objective measures were completed at Evaluation unless otherwise noted.  DIAGNOSTIC FINDINGS: none recent   PATIENT SURVEYS: NT     COGNITION: Overall cognitive status: Within functional limits for tasks assessed     SENSATION: Major numbness both legs, bottom 1/3 of my legs and feet  EDEMA:  None   MUSCLE LENGTH: Hamstrings: lacks about 10 deg each leg.  Rt LE SLR 55 and Lt. 45 deg  Thomas test: NT   POSTURE: rounded shoulders, forward head, increased thoracic kyphosis, posterior pelvic tilt, and flexed trunk   PALPATION: Pain to palpation of left piriformis but only minimal to moderate No pain in the left hamstring or calf  LOWER EXTREMITY ROM: Within functional limits Patient notices a greater sense of tightness in the left hamstring with passive stretching  Active ROM Right eval Left eval  Hip flexion    Hip extension    Hip  abduction    Hip adduction    Hip internal rotation    Hip external rotation    Knee flexion    Knee extension    Ankle dorsiflexion    Ankle plantarflexion    Ankle inversion    Ankle eversion     (Blank rows = not tested)  LOWER EXTREMITY MMT:  MMT Right eval Left eval  Hip flexion 5 5  Hip extension 4 4  Hip abduction 3+ 4  Hip adduction    Hip internal rotation    Hip external rotation    Knee flexion 5 4+  Knee extension 5 4+  Ankle dorsiflexion    Ankle plantarflexion    Ankle inversion    Ankle eversion     (Blank rows = not tested)  LOWER EXTREMITY SPECIAL TESTS:  NT   FUNCTIONAL TESTS:  5 times sit to stand: 17 sec  Needs UE assit to balance on one leg , hip compensation, trunk lean   GAIT: Distance walked: 150 Assistive device utilized: None Level of assistance: Modified independence Comments: Trunk flexed upper back rounded and shoulders forward.  Apparent decrease in proprioception in the lower body with decreased hip stability lack of arm swing and right foot slight lead turn and word                                                                                                                                TREATMENT DATE:   [facility] Adult PT Treatment:                                                DATE: 04/14/24 Therapeutic Exercise: Piriformis stretching   Therapeutic Activity: Balance with airex pad: narrow, tandem combos EO, EC, head turns and nods SLS SLS with back toe down  Standing skaters and semi-circles Standing hip extension (towel slides)  Gait training- various speeds and quick start/stops , trial with cane    [facility] Adult PT Treatment:                                                DATE: 04/11/24 Therapeutic Exercise: Open book Hip abduction Clam  Seated calf raise x 15 , 15 lbs each LE  Seated piriformis figure 4  Neuromuscular re-ed: Single leg hip hinge with UE support  Airex:head turns and nods 5 lbs anti rotation OH  lift 5 lbs on Airex Cone tap, min A needed  Standing heel raise very difficulty  Front split squat (for floor transfer training) SLS activities    [facility] Adult PT Treatment:                                                DATE: 04/05/24 Therapeutic Exercise: TM 2 % with 5 min warm up  Standing SLS Standing hip abduction with and without 3 lbs cuff x 2 x 10  Standing hip extension x 15 , used Airex as well  Seated hamstring  Seated Fig 4  Lateral walking 4 x 25 feet  Hip hinge 15 lbs x 15 , cues mod  [facility] Adult PT Treatment:  DATE: 03/30/24 Therapeutic Exercise: NuStep L7 UE and LE for 6 min , increase in LLE pain  Calf stretch on slant board  Trunk L stretch  x 3 Stretch outer hips, knee to chest with rotation Bridge with ball  LTR with ball  Bridge with hamstring curl x 5   Stretch Hamstring and ITB  Neuromuscular re-ed: Supine pelvis tilt over dynadisc  Added abdominal bracing with PPT  March  Knee and hip extension, LLE painful 5/10  Clam unilateral and bilateral with dynadisc  PATIENT EDUCATION:  Education details: See above self-care Person educated: Patient Education method: Explanation, Demonstration, Tactile cues, Verbal cues, and Handouts Education comprehension: verbalized understanding and needs further education  HOME EXERCISE PROGRAM: Access Code: [email or web address] URL: [email or web address] Date: 03/14/2024 Prepared by: Delon Norma  Exercises - Sidelying Hip Abduction  - 1 x daily - 7 x weekly - 3 sets - 10 reps - Sidestepping  - 1 x daily - 7 x weekly - 3 sets - 10 reps - Single Leg Stance with Support  - 1 x daily - 7 x weekly - 1 sets - 3-5 reps - 30 hold - Standing Hip Abduction with Resistance at Ankles and Counter Support  - 1 x daily - 7 x weekly - 2 sets - 10 reps - 5 hold - Hip Extension with Resistance Loop  - 1 x daily - 7 x weekly - 2 sets - 10 reps - 5 hold - calf raise     ASSESSMENT:  CLINICAL IMPRESSION: Session emphasized balance and hip strength today, reviewed home routine and offer corrections to include increasing the time spent with his head turn in order to fully integrate the balance challenge.  He was able to eventually stand on 1 leg for about 10 seconds but the left side is markedly less stable.  Patient notes neuropathy may be getting worse and is definitely impacting his mobility.  He needs to visually see each step which lends itself to a bent over posture with gait.  He has decreased heel strike and walks with a heavy step.  We practiced walking with a cane and it did improve his stability.  He may be more open to consider a walking pole to improve his upright posture as well as using it for a balance tool at home.  Patient will continue to benefit from skilled PT in order to return to PLOF and optimize functional mobility.         OBJECTIVE IMPAIRMENTS: Abnormal gait, decreased balance, decreased coordination, decreased knowledge of condition, decreased mobility, difficulty walking, decreased strength, increased fascial restrictions, impaired flexibility, impaired UE functional use, improper body mechanics, postural dysfunction, and pain.   ACTIVITY LIMITATIONS: lifting, standing, stairs, transfers, and locomotion level  PARTICIPATION LIMITATIONS: community activity and occupation  PERSONAL FACTORS: Age, Time since onset of injury/illness/exacerbation, and 3+ comorbidities: AS, A-fib, Neuropathy are also affecting patient's functional outcome.   REHAB POTENTIAL: Excellent  CLINICAL DECISION MAKING: Evolving/moderate complexity  EVALUATION COMPLEXITY: Moderate   GOALS: Goals reviewed with patient? Yes  SHORT TERM GOALS: Target date: 04/07/2024   Patient will be able to show independence for initial HEP to include posture, core and hip strength and stability.   Baseline: Goal status:MET   2.  Patient will be complete functional  mobility testing/balance and goal set  Baseline:  Goal status:MET   3.  Pt will modify workout schedule to a strength focus Baseline:  Goal status:MET    LONG TERM GOALS: Target date: 05/05/2024  Patient will be independent with final HEP upon discharge from PT and report consistent benefit following exercise completion.    Baseline:  Goal status: ongoing   2.  Pt will be able to demonstrate good standing balance as evidenced by balance recovery from min to mod dynamic balance challenges.   Baseline:  Goal status: ongoing   3.  Patient will be able to demonstrate hip/knee/ankle strength to 5/5 in order to maximize functional mobility, ambulation and lifting.   Baseline:  Goal status:ongoing   4.  Patient will be able to perform sit to stand  x 10 in 30 sec Baseline: 18 sec x 5  Goal status: INITIAL  5.  Patient will feel more confident and stable in his balance when at church Baseline:  Goal status: INITIAL  6.  Pt will be abel to stand for 30 min without increased LLE pain.  Baseline:  Goal status: INITIAL   PLAN:  PT FREQUENCY: 1-2x/week  PT DURATION: 8 weeks   PLANNED INTERVENTIONS: 97164- PT Re-evaluation, 97750- Physical Performance Testing, 97110-Therapeutic exercises, 97530- Therapeutic activity, [id number]- Neuromuscular re-education, 97535- Self Care, [id number]- Manual therapy, [id number]- Gait training, [phone]- Aquatic Therapy, [phone] (1-2 muscles), 20561 (3+ muscles)- Dry Needling, Patient/Family education, Balance training, Taping, Spinal mobilization, Cryotherapy, and Moist heat  PLAN FOR NEXT SESSION:  Check goals. L balance test strength in LE, hips and core , posture   Terrisha Lopata, PT 04/14/2024, 11:59 AM   Delon Norma, PT 04/14/24 11:59 AM Phone: [phone] Fax: [phone]

## 2024-04-17 NOTE — Therapy (Unsigned)
 OUTPATIENT PHYSICAL THERAPY LOWER EXTREMITY NOTE    Patient Name: Wesley [name]. MRN: [id number] DOB:[birth year], 83 y.o., male Today's Date: 04/18/2024  END OF SESSION:  PT End of Session - 04/18/24 0925     Visit Number 9    Number of Visits 12    Date for Recertification  05/05/24    Authorization Type Medicare, [employer]    PT Start Time 0930    PT Stop Time 1020    PT Time Calculation (min) 50 min    Equipment Utilized During Treatment Gait belt    Activity Tolerance Patient tolerated treatment well    Behavior During Therapy WFL for tasks assessed/performed              Past Medical History:  Diagnosis Date   A-fib (HCC)    cardioversion x2   Ankylosing spondylitis (HCC)    Atrial flutter (HCC)    GERD (gastroesophageal reflux disease)    Hemorrhoids    Irregular heart beat    MVP (mitral valve prolapse)    WITH A MIDSYSTOLIC CLICK   OSA (obstructive sleep apnea)    uses CPAP nightly   Peripheral neuropathy    Vitamin B12 deficiency    Past Surgical History:  Procedure Laterality Date   ABLATION     CARDIOVERSION N/A 12/20/2019   Procedure: CARDIOVERSION;  Surgeon: [name], MD;  Location: [facility];  Service: Cardiovascular;  Laterality: N/A;   COLONOSCOPY     INGUINAL HERNIA REPAIR     LESION REMOVAL N/A 05/22/2022   Procedure: EXCISION UPPER BACK SEBACEOUS CYST;  Surgeon: [name], MD;  Location: [facility];  Service: General;  Laterality: N/A;  LOCAL ANESTHESIA   venous ligation     for varices left lower leg   Patient Active Problem List   Diagnosis Date Noted   Sciatica of left side associated with disorder of lumbar spine 02/22/2024   Abnormality of gait 06/26/2021   Chronic hoarseness 10/04/2020   PAF (paroxysmal atrial fibrillation) (HCC)    DDD (degenerative disc disease), lumbar 10/28/2017   Bilateral bunions 08/20/2016   Varicose veins of right lower extremity with complications 01/07/2016   Bruit  10/10/2015   Varicose veins of leg with complications 10/01/2015   Ankylosing spondylitis (HCC) 01/14/2015   Chronic anticoagulation 01/14/2015   Sleep apnea 01/14/2015   S/P ablation of atrial fibrillation 01/08/2015   Persistent atrial fibrillation (HCC) 01/08/2015   Orthostatic dizziness 12/01/2013   Calf pain 11/22/2012   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/22/2012   Peripheral neuropathy 10/21/2012   Pain in joint, ankle and foot 09/21/2012   Disturbance of skin sensation 09/21/2012   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 09/21/2012   Other general symptoms(780.99) 09/21/2012   Heel pain 06/07/2012   Plantar fasciitis 06/07/2012   Mitral valve prolapse 06/03/2011   Pain in joint of right shoulder 04/03/2011   Rotator cuff syndrome of right shoulder 04/03/2011   Paroxysmal atrial fibrillation (HCC) 01/20/2011   Hypercholesterolemia 01/20/2011   OBSTRUCTIVE SLEEP APNEA 10/08/2009    PCP: [name] MD   REFERRING PROVIDER: [name] MD/ [name] MD   REFERRING DIAG: [id number] (ICD-10-CM) - Left sided sciatica  THERAPY DIAG:  Other abnormalities of gait and mobility  Muscle weakness (generalized)  Abnormal posture  Pain in left leg  Rationale for Evaluation and Treatment: Rehabilitation  ONSET DATE: acute on chronic   SUBJECTIVE:   SUBJECTIVE STATEMENT: I do these exercises I want to be sure I'm  doing it right.    PERTINENT HISTORY: Dr. Harvey note  had sent him to neuro PT because of what I saw is a high fall risk No recent falls but he feels unsteady at times and even has had some retropulsion Today he has dynamic genu valgus on the right with hip abduction weakness I gave him a hip abduction series but want him to pursue this at physical therapy until we can get improved right hip strength  Patient saw Dr. Teressa 2 weeks ago for worsening sciatica that left leg.  He has a progressive peripheral neuropathy.  He has a diagnosis that started when  he was young of ankylosing spondylitis.  Is been thought that his radicular symptoms probably originate in his lumbar spine.   PAIN:  Are you having pain? Yes: NPRS scale: early AM 3/10- 4/10 , none today   Pain location: L thigh, buttock  Pain description: aching  Aggravating factors: standing too long (> 30) , sitting too long with knee flexed , can be 8/10  Relieving factors: gabapentin , stretching , moving , figure 4 stretch    PRECAUTIONS: None monitor balance  RED FLAGS: None   WEIGHT BEARING RESTRICTIONS: No  FALLS:  Has patient fallen in last 6 months? No but does note worsening balance lately.   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  PATIENT GOALS: To be rid of the pain   NEXT MD VISIT: as needed   OBJECTIVE:   Note: Objective measures were completed at Evaluation unless otherwise noted.  DIAGNOSTIC FINDINGS: none recent   PATIENT SURVEYS: NT     COGNITION: Overall cognitive status: Within functional limits for tasks assessed     SENSATION: Major numbness both legs, bottom 1/3 of my legs and feet  EDEMA:  None   MUSCLE LENGTH: Hamstrings: lacks about 10 deg each leg.  Rt LE SLR 55 and Lt. 45 deg  Thomas test: NT   POSTURE: rounded shoulders, forward head, increased thoracic kyphosis, posterior pelvic tilt, and flexed trunk   PALPATION: Pain to palpation of left piriformis but only minimal to moderate No pain in the left hamstring or calf  LOWER EXTREMITY ROM: Within functional limits Patient notices a greater sense of tightness in the left hamstring with passive stretching  Active ROM Right eval Left eval  Hip flexion    Hip extension    Hip abduction    Hip adduction    Hip  internal rotation    Hip external rotation    Knee flexion    Knee extension    Ankle dorsiflexion    Ankle plantarflexion    Ankle inversion    Ankle eversion     (Blank rows = not tested)  LOWER EXTREMITY MMT:  MMT Right eval Left eval  Hip flexion 5 5  Hip extension 4 4  Hip abduction 3+ 4  Hip adduction    Hip internal rotation    Hip external rotation    Knee flexion 5 4+  Knee extension 5 4+  Ankle dorsiflexion    Ankle plantarflexion    Ankle inversion    Ankle eversion     (Blank rows = not tested)  LOWER EXTREMITY SPECIAL TESTS:  NT  FUNCTIONAL TESTS:  5 times sit to stand: 17 sec  Needs UE assit to balance on one leg , hip compensation, trunk lean   GAIT: Distance walked: 150 Assistive device utilized: None Level of assistance: Modified independence Comments: Trunk flexed upper back rounded and shoulders forward.  Apparent decrease in proprioception in the lower body with decreased hip stability lack of arm swing and right foot slight lead turn and word                                                                                                                                TREATMENT DATE:   [facility] Adult PT Treatment:                                                DATE: 04/18/24 Neuromuscular re-ed: Pilates Reformer used for LE/core strength, postural strength, lumbopelvic disassociation and core control.  Exercises included: Footwork 2 red 1 blue 1 yellow with GTB around thighs  Double leg Parallel heels x 20 added GTB Single leg  Heel in parallel and turnout 2 red 1 blue  Double leg heel raise and calf stretch, prancing Clam GTB double leg then single leg  Bridging 4 springs x 5 partial with GTB Supine Arm Arcs 1 red 1 yellow  Circles , caution to  Feet in Straps 1 red 1 yellow Arcs and Frog squat  Self Care: Considering cane Goal check   [facility] Adult PT Treatment:                                                DATE: 04/14/24 Therapeutic  Exercise: Piriformis stretching   Therapeutic Activity: Balance with airex pad: narrow, tandem combos EO, EC, head turns and nods SLS SLS with back toe down  Standing skaters and semi-circles Standing hip extension (towel slides)  Gait training- various speeds and quick start/stops , trial with cane    [facility] Adult PT Treatment:                                                DATE: 04/11/24 Therapeutic Exercise: Open book Hip abduction Clam  Seated calf raise x 15 , 15 lbs each LE  Seated piriformis figure 4  Neuromuscular re-ed: Single leg hip hinge with UE support  Airex:head turns and nods 5 lbs anti rotation OH lift 5 lbs on Airex Cone tap, min A needed  Standing heel raise very difficulty  Front split squat (for floor transfer training) SLS activities    [facility] Adult PT Treatment:  DATE: 04/05/24 Therapeutic Exercise: TM 2 % with 5 min warm up  Standing SLS Standing hip abduction with and without 3 lbs cuff x 2 x 10  Standing hip extension x 15 , used Airex as well  Seated hamstring  Seated Fig 4  Lateral walking 4 x 25 feet  Hip hinge 15 lbs x 15 , cues mod  [facility] Adult PT Treatment:                                                DATE: 03/30/24 Therapeutic Exercise: NuStep L7 UE and LE for 6 min , increase in LLE pain  Calf stretch on slant board  Trunk L stretch  x 3 Stretch outer hips, knee to chest with rotation Bridge with ball  LTR with ball  Bridge with hamstring curl x 5   Stretch Hamstring and ITB  Neuromuscular re-ed: Supine pelvis tilt over dynadisc  Added abdominal bracing with PPT  March  Knee and hip extension, LLE painful 5/10  Clam unilateral and bilateral with dynadisc  PATIENT EDUCATION:  Education details: See above self-care Person educated: Patient Education method: Explanation, Demonstration, Tactile cues, Verbal cues, and Handouts Education comprehension: verbalized understanding and needs  further education  HOME EXERCISE PROGRAM: Access Code: [email or web address] URL: [email or web address] Date: 03/14/2024 Prepared by: Delon Norma  Exercises - Sidelying Hip Abduction  - 1 x daily - 7 x weekly - 3 sets - 10 reps - Sidestepping  - 1 x daily - 7 x weekly - 3 sets - 10 reps - Single Leg Stance with Support  - 1 x daily - 7 x weekly - 1 sets - 3-5 reps - 30 hold - Standing Hip Abduction with Resistance at Ankles and Counter Support  - 1 x daily - 7 x weekly - 2 sets - 10 reps - 5 hold - Hip Extension with Resistance Loop  - 1 x daily - 7 x weekly - 2 sets - 10 reps - 5 hold - calf raise    ASSESSMENT:  CLINICAL IMPRESSION: Reinforced how to apply what we do in PT to his current home program.  Change chair hgt, add a weight or go more slowly , squat.  He did have increased LLE pain with supine Reformer exercises.  He was able to move through the basic Pilates exercises with adjustments for hamstring tightness.  Patient will continue to benefit from skilled PT in order to return to PLOF and optimize functional mobility.      Last session: Session emphasized balance and hip strength today, reviewed home routine and offer corrections to include increasing the time spent with his head turn in order to fully integrate the balance challenge.  He was able to eventually stand on 1 leg for about 10 seconds but the left side is markedly less stable.  Patient notes neuropathy may be getting worse and is definitely impacting his mobility.  He needs to visually see each step which lends itself to a bent over posture with gait.  He has decreased heel strike and walks with a heavy step.  We practiced walking with a cane and it did improve his stability.  He may be more open to consider a walking pole to improve his upright posture as well as using it for a balance tool at home.  Patient will continue to benefit from skilled  PT in order to return to PLOF and optimize functional mobility.          OBJECTIVE IMPAIRMENTS: Abnormal gait, decreased balance, decreased coordination, decreased knowledge of condition, decreased mobility, difficulty walking, decreased strength, increased fascial restrictions, impaired flexibility, impaired UE functional use, improper body mechanics, postural dysfunction, and pain.   ACTIVITY LIMITATIONS: lifting, standing, stairs, transfers, and locomotion level  PARTICIPATION LIMITATIONS: community activity and occupation  PERSONAL FACTORS: Age, Time since onset of injury/illness/exacerbation, and 3+ comorbidities: AS, A-fib, Neuropathy are also affecting patient's functional outcome.   REHAB POTENTIAL: Excellent  CLINICAL DECISION MAKING: Evolving/moderate complexity  EVALUATION COMPLEXITY: Moderate   GOALS: Goals reviewed with patient? Yes  SHORT TERM GOALS: Target date: 04/07/2024   Patient will be able to show independence for initial HEP to include posture, core and hip strength and stability.   Baseline: Goal status:MET   2.  Patient will be complete functional mobility testing/balance and goal set  Baseline:  Goal status:MET   3.  Pt will modify workout schedule to a strength focus Baseline:  Goal status:MET    LONG TERM GOALS: Target date: 05/05/2024    Patient will be independent with final HEP upon discharge from PT and report consistent benefit following exercise completion.    Baseline:  Goal status: ongoing   2.  Pt will be able to demonstrate good standing balance as evidenced by balance recovery from min to mod dynamic balance challenges.   Baseline:  Goal status: ongoing   3.  Patient will be able to demonstrate hip/knee/ankle strength to 5/5 in order to maximize functional mobility, ambulation and lifting.   Baseline:  Goal status:ongoing   4.  Patient will be able to perform sit to stand  x 10 in 30 sec Baseline: 18 sec x 5  Goal status: INITIAL  5.  Patient will feel more confident and stable in his balance  when at church.   Baseline: reports was bad in 2020 but since he is working on it, this has been maintained currently.  Less balance confidence when dynamic.  Goal status:ongoing   6.  Pt will be able to stand for 30 min without increased LLE pain.  Baseline: does standing inc LE pain  Goal status:ongoing    PLAN:  PT FREQUENCY: 1-2x/week  PT DURATION: 8 weeks   PLANNED INTERVENTIONS: [id number]- PT Re-evaluation, 97750- Physical Performance Testing, 97110-Therapeutic exercises, 97530- Therapeutic activity, [id number]- Neuromuscular re-education, 97535- Self Care, [id number]- Manual therapy, [id number]- Gait training, [id number]- Aquatic Therapy, [phone] (1-2 muscles), [id number] (3+ muscles)- Dry Needling, Patient/Family education, Balance training, Taping, Spinal mobilization, Cryotherapy, and Moist heat  PLAN FOR NEXT SESSION:   Pilates result? L balance test strength in LE, hips and core , posture   Fannie Gathright, PT 04/18/2024, 11:06 AM   Delon Norma, PT 04/18/24 11:06 AM Phone: [phone] Fax: [phone]

## 2024-04-18 ENCOUNTER — Ambulatory Visit (INDEPENDENT_AMBULATORY_CARE_PROVIDER_SITE_OTHER): Admitting: Physical Therapy

## 2024-04-18 ENCOUNTER — Encounter: Payer: Self-pay | Admitting: Physical Therapy

## 2024-04-18 DIAGNOSIS — M79605 Pain in left leg: Secondary | ICD-10-CM

## 2024-04-18 DIAGNOSIS — R2689 Other abnormalities of gait and mobility: Secondary | ICD-10-CM

## 2024-04-18 DIAGNOSIS — M6281 Muscle weakness (generalized): Secondary | ICD-10-CM

## 2024-04-18 DIAGNOSIS — R293 Abnormal posture: Secondary | ICD-10-CM | POA: Diagnosis not present

## 2024-04-20 ENCOUNTER — Encounter: Admitting: Physical Therapy

## 2024-04-21 ENCOUNTER — Encounter: Admitting: Physical Therapy

## 2024-04-21 ENCOUNTER — Encounter: Payer: Self-pay | Admitting: Physical Therapy

## 2024-04-21 ENCOUNTER — Ambulatory Visit (INDEPENDENT_AMBULATORY_CARE_PROVIDER_SITE_OTHER): Admitting: Physical Therapy

## 2024-04-21 DIAGNOSIS — R2689 Other abnormalities of gait and mobility: Secondary | ICD-10-CM | POA: Diagnosis not present

## 2024-04-21 DIAGNOSIS — M79605 Pain in left leg: Secondary | ICD-10-CM | POA: Diagnosis not present

## 2024-04-21 DIAGNOSIS — M6281 Muscle weakness (generalized): Secondary | ICD-10-CM

## 2024-04-21 DIAGNOSIS — R293 Abnormal posture: Secondary | ICD-10-CM | POA: Diagnosis not present

## 2024-04-21 NOTE — Therapy (Signed)
 OUTPATIENT PHYSICAL THERAPY LOWER EXTREMITY NOTE    Patient Name: Wesley [name]. MRN: [id number] DOB:06/30/1941, 83 y.o., male Today's Date: 04/21/2024     Progress Note Reporting Period  03/10/24 to 04/21/24  See note below for Objective Data and Assessment of Progress/Goals.    END OF SESSION:  PT End of Session - 04/21/24 0839     Visit Number 10    Number of Visits 12    Date for Recertification  05/05/24    Authorization Type Medicare, [employer]    PT Start Time 0845    PT Stop Time 0930    PT Time Calculation (min) 45 min    Equipment Utilized During Treatment Gait belt    Activity Tolerance Patient tolerated treatment well    Behavior During Therapy WFL for tasks assessed/performed              Past Medical History:  Diagnosis Date   A-fib (HCC)    cardioversion x2   Ankylosing spondylitis (HCC)    Atrial flutter (HCC)    GERD (gastroesophageal reflux disease)    Hemorrhoids    Irregular heart beat    MVP (mitral valve prolapse)    WITH A MIDSYSTOLIC CLICK   OSA (obstructive sleep apnea)    uses CPAP nightly   Peripheral neuropathy    Vitamin B12 deficiency    Past Surgical History:  Procedure Laterality Date   ABLATION     CARDIOVERSION N/A 12/20/2019   Procedure: CARDIOVERSION;  Surgeon: [name], MD;  Location: [facility];  Service: Cardiovascular;  Laterality: N/A;   COLONOSCOPY     INGUINAL HERNIA REPAIR     LESION REMOVAL N/A 05/22/2022   Procedure: EXCISION UPPER BACK SEBACEOUS CYST;  Surgeon: [name], MD;  Location: [facility];  Service: General;  Laterality: N/A;  LOCAL ANESTHESIA   venous ligation     for varices left lower leg   Patient Active Problem List   Diagnosis Date Noted   Sciatica of left side associated with disorder of lumbar spine 02/22/2024   Abnormality of gait 06/26/2021   Chronic hoarseness 10/04/2020   PAF (paroxysmal atrial fibrillation) (HCC)    DDD (degenerative disc disease), lumbar  10/28/2017   Bilateral bunions 08/20/2016   Varicose veins of right lower extremity with complications 01/07/2016   Bruit 10/10/2015   Varicose veins of leg with complications 10/01/2015   Ankylosing spondylitis (HCC) 01/14/2015   Chronic anticoagulation 01/14/2015   Sleep apnea 01/14/2015   S/P ablation of atrial fibrillation 01/08/2015   Persistent atrial fibrillation (HCC) 01/08/2015   Orthostatic dizziness 12/01/2013   Calf pain 11/22/2012   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/22/2012   Peripheral neuropathy 10/21/2012   Pain in joint, ankle and foot 09/21/2012   Disturbance of skin sensation 09/21/2012   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 09/21/2012   Other general symptoms(780.99) 09/21/2012   Heel pain 06/07/2012   Plantar fasciitis 06/07/2012   Mitral valve prolapse 06/03/2011   Pain in joint of right shoulder 04/03/2011   Rotator cuff syndrome of right shoulder 04/03/2011   Paroxysmal atrial fibrillation (HCC) 01/20/2011   Hypercholesterolemia 01/20/2011   OBSTRUCTIVE SLEEP APNEA 10/08/2009    PCP: [name] MD   REFERRING PROVIDER: [name] MD/ [name] MD   REFERRING DIAG: [phone] (ICD-10-CM) - Left sided sciatica  THERAPY DIAG:  Other abnormalities of gait and mobility  Muscle weakness (generalized)  Abnormal posture  Pain in left leg  Rationale for Evaluation and  Treatment: Rehabilitation  ONSET DATE: acute on chronic   SUBJECTIVE:   SUBJECTIVE STATEMENT: I feel like I can gradually lift my leg up higher.  I felt really good after the Pilates session.  Like I was taller.    PERTINENT HISTORY: Dr. Harvey note  had sent him to neuro PT because of what I saw is a high fall risk No recent falls but he feels unsteady at times and even has had some retropulsion Today he has dynamic genu valgus on the right with hip abduction weakness I gave him a hip abduction series but want him to pursue this at physical therapy until we can get  improved right hip strength  Patient saw Dr. Teressa 2 weeks ago for worsening sciatica that left leg.  He has a progressive peripheral neuropathy.  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  Is been thought that his radicular symptoms probably originate in his lumbar spine.   PAIN:  Are you having pain? Yes: NPRS scale: early AM 3/10- 4/10 , none today   Pain location: L thigh, buttock  Pain description: aching  Aggravating factors: standing too long (> 30) , sitting too long with knee flexed , can be 8/10  Relieving factors: gabapentin , stretching , moving , figure 4 stretch    PRECAUTIONS: None monitor balance  RED FLAGS: None   WEIGHT BEARING RESTRICTIONS: No  FALLS:  Has patient fallen in last 6 months? No but does note worsening balance lately.   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  PATIENT GOALS: To be rid of the pain   NEXT MD VISIT: as needed   OBJECTIVE:   Note: Objective measures were completed at Evaluation unless otherwise noted.  DIAGNOSTIC FINDINGS: none recent   PATIENT SURVEYS: NT     COGNITION: Overall cognitive status: Within functional limits for tasks assessed     SENSATION: Major numbness both legs, bottom 1/3 of my legs and feet  EDEMA:  None   MUSCLE LENGTH: Hamstrings: lacks about 10 deg each leg.  Rt LE SLR 55 and Lt. 45 deg  Thomas test: NT   POSTURE: rounded shoulders, forward head, increased thoracic kyphosis, posterior pelvic tilt, and flexed trunk   PALPATION: Pain to palpation of left piriformis but only minimal to moderate No pain in the left hamstring or calf  LOWER EXTREMITY ROM: Within functional limits Patient notices a  greater sense of tightness in the left hamstring with passive stretching  Active ROM Right eval Left eval  Hip flexion    Hip extension    Hip abduction    Hip adduction    Hip internal rotation    Hip external rotation    Knee flexion    Knee extension    Ankle dorsiflexion    Ankle plantarflexion    Ankle inversion    Ankle eversion     (Blank rows = not tested)  LOWER EXTREMITY MMT:  MMT Right eval Left eval Rt./Lt.  9/26  Hip flexion 5 5   Hip extension 4 4 4+/5, 4+/5  Hip abduction 3+ 4 3+/5, 4/5  Hip adduction     Hip internal rotation     Hip external  rotation     Knee flexion 5 4+   Knee extension 5 4+   Ankle dorsiflexion     Ankle plantarflexion     Ankle inversion     Ankle eversion      (Blank rows = not tested)  LOWER EXTREMITY SPECIAL TESTS:  NT   FUNCTIONAL TESTS:  5 times sit to stand: 17 sec  04/21/24: 5 X STS 12 sec  Needs UE assit to balance on one leg , hip compensation, trunk lean   GAIT: Distance walked: 150 Assistive device utilized: None Level of assistance: Modified independence Comments: Trunk flexed upper back rounded and shoulders forward.  Apparent decrease in proprioception in the lower body with decreased hip stability lack of arm swing and right foot slight lead turn and word                                                                                                                                TREATMENT DATE:    [facility] Adult PT Treatment:                                                DATE: 04/21/24 Therapeutic Exercise: TM 2.0 mph warm up focus on heel strike and upright posture, 6 min   Neuromuscular re-ed: Pilates Tower for LE/Core strength, postural strength, lumbopelvic disassociation and core control.  Exercises included: Supine Leg Springs  Single leg arcs Double leg arcs and circles  Sidelying Leg Springs Hip add/abd Sidekicks Circles  Arm [employer] Bridging with arcs  90/90 arcs      [facility]  Adult PT Treatment:                                                DATE: 04/18/24 Neuromuscular re-ed: Pilates Reformer used for LE/core strength, postural strength, lumbopelvic disassociation and core control.  Exercises included: Footwork 2 red 1 blue 1 yellow with GTB around thighs  Double leg Parallel heels x 20 added GTB Single leg  Heel in parallel and turnout 2 red 1 blue  Double leg heel raise and calf stretch, prancing Clam GTB double leg then single leg  Bridging 4 springs x 5 partial with GTB Supine Arm Arcs 1 red 1 yellow  Circles , caution to  Feet in Straps 1 red 1 yellow Arcs and Frog squat  Self Care: Considering cane Goal check   [facility] Adult PT Treatment:  DATE: 04/14/24 Therapeutic Exercise: Piriformis stretching   Therapeutic Activity: Balance with airex pad: narrow, tandem combos EO, EC, head turns and nods SLS SLS with back toe down  Standing skaters and semi-circles Standing hip extension (towel slides)  Gait training- various speeds and quick start/stops , trial with cane    PATIENT EDUCATION:  Education details: See above self-care Person educated: Patient Education method: Explanation, Demonstration, Tactile cues, Verbal cues, and Handouts Education comprehension: verbalized understanding and needs further education  HOME EXERCISE PROGRAM: Access Code: [email or web address] URL: [email or web address] Date: 03/14/2024 Prepared by: Delon Norma  Exercises - Sidelying Hip Abduction  - 1 x daily - 7 x weekly - 3 sets - 10 reps - Sidestepping  - 1 x daily - 7 x weekly - 3 sets - 10 reps - Single Leg Stance with Support  - 1 x daily - 7 x weekly - 1 sets - 3-5 reps - 30 hold - Standing Hip Abduction with Resistance at Ankles and Counter Support  - 1 x daily - 7 x weekly - 2 sets - 10 reps - 5 hold - Hip Extension with Resistance Loop  - 1 x daily - 7 x weekly - 2 sets - 10 reps - 5 hold - calf raise     ASSESSMENT:  CLINICAL IMPRESSION: Patient utilized Tower for LE control and stability, incorporated upper body as well.  Noted Rt hip weaker than Lt LE in sidelying position. Patient has improver his ability to sit to stand without UEs assist. He continues to have pretty significant weakness in his hips, bilateral and today his LLE was weaker than the Rt.  The neuropathy varies as well and there is cross over with the lumbar radiculopathy.  The Pilates environment provides the good feedback for providing proprioceptive input to joints and surrounding tissues for stability and control.  Patient will continue to benefit from skilled PT in order to return to PLOF and optimize functional mobility.      Last session: Session emphasized balance and hip strength today, reviewed home routine and offer corrections to include increasing the time spent with his head turn in order to fully integrate the balance challenge.  He was able to eventually stand on 1 leg for about 10 seconds but the left side is markedly less stable.  Patient notes neuropathy may be getting worse and is definitely impacting his mobility.  He needs to visually see each step which lends itself to a bent over posture with gait.  He has decreased heel strike and walks with a heavy step.  We practiced walking with a cane and it did improve his stability.  He may be more open to consider a walking pole to improve his upright posture as well as using it for a balance tool at home.  Patient will continue to benefit from skilled PT in order to return to PLOF and optimize functional mobility.         OBJECTIVE IMPAIRMENTS: Abnormal gait, decreased balance, decreased coordination, decreased knowledge of condition, decreased mobility, difficulty walking, decreased strength, increased fascial restrictions, impaired flexibility, impaired UE functional use, improper body mechanics, postural dysfunction, and pain.   ACTIVITY LIMITATIONS: lifting,  standing, stairs, transfers, and locomotion level  PARTICIPATION LIMITATIONS: community activity and occupation  PERSONAL FACTORS: Age, Time since onset of injury/illness/exacerbation, and 3+ comorbidities: AS, A-fib, Neuropathy are also affecting patient's functional outcome.   REHAB POTENTIAL: Excellent  CLINICAL DECISION MAKING: Evolving/moderate complexity  EVALUATION COMPLEXITY: Moderate   GOALS: Goals reviewed  with patient? Yes  SHORT TERM GOALS: Target date: 04/07/2024   Patient will be able to show independence for initial HEP to include posture, core and hip strength and stability.   Baseline: Goal status:MET   2.  Patient will be complete functional mobility testing/balance and goal set  Baseline:  Goal status:MET   3.  Pt will modify workout schedule to a strength focus Baseline:  Goal status:MET    LONG TERM GOALS: Target date: 05/05/2024    Patient will be independent with final HEP upon discharge from PT and report consistent benefit following exercise completion.    Baseline:  Goal status: ongoing   2.  Pt will be able to demonstrate good standing balance as evidenced by balance recovery from min to mod dynamic balance challenges.   Baseline:  Goal status: ongoing   3.  Patient will be able to demonstrate hip/knee/ankle strength to 5/5 in order to maximize functional mobility, ambulation and lifting.   Baseline:  Goal status:ongoing   4.  Patient will be able to perform sit to stand  x 10 in 30 sec Baseline: 18 sec x 5  Goal status: INITIAL  5.  Patient will feel more confident and stable in his balance when at church.   Baseline: reports was bad in 2020 but since he is working on it, this has been maintained currently.  Less balance confidence when dynamic.  Goal status:ongoing   6.  Pt will be able to stand for 30 min without increased LLE pain.  Baseline: does standing inc LE pain  Goal status:ongoing    PLAN:  PT FREQUENCY:  1-2x/week  PT DURATION: 8 weeks   PLANNED INTERVENTIONS: 97164- PT Re-evaluation, 97750- Physical Performance Testing, 97110-Therapeutic exercises, 97530- Therapeutic activity, 97112- Neuromuscular re-education, 97535- Self Care, [id number]- Manual therapy, [phone]- Gait training, [phone]- Aquatic Therapy, [phone] (1-2 muscles), [id number] (3+ muscles)- Dry Needling, Patient/Family education, Balance training, Taping, Spinal mobilization, Cryotherapy, and Moist heat  PLAN FOR NEXT SESSION:   L balance test strength in LE, hips and core , posture   Berlin Viereck, PT 04/21/2024, 10:41 AM   Delon Norma, PT 04/21/24 10:41 AM Phone: [phone] Fax: [phone]

## 2024-04-27 ENCOUNTER — Ambulatory Visit (INDEPENDENT_AMBULATORY_CARE_PROVIDER_SITE_OTHER): Admitting: Physical Therapy

## 2024-04-27 DIAGNOSIS — R2689 Other abnormalities of gait and mobility: Secondary | ICD-10-CM

## 2024-04-27 DIAGNOSIS — M6281 Muscle weakness (generalized): Secondary | ICD-10-CM | POA: Diagnosis not present

## 2024-04-27 DIAGNOSIS — R293 Abnormal posture: Secondary | ICD-10-CM | POA: Diagnosis not present

## 2024-04-27 DIAGNOSIS — M79605 Pain in left leg: Secondary | ICD-10-CM

## 2024-04-27 NOTE — Therapy (Signed)
 OUTPATIENT PHYSICAL THERAPY LOWER EXTREMITY NOTE    Patient Name: Wesley [name]. MRN: [id number] DOB:[birth year], 83 y.o., male Today's Date: 04/27/2024     Progress Note Reporting Period  03/10/24 to 04/21/24  See note below for Objective Data and Assessment of Progress/Goals.    END OF SESSION:  PT End of Session - 04/27/24 1104     Visit Number 11    Number of Visits 12    Date for Recertification  05/05/24    Authorization Type Medicare, [employer]    PT Start Time 1018    PT Stop Time 1102    PT Time Calculation (min) 44 min    Equipment Utilized During Treatment Gait belt    Activity Tolerance Patient tolerated treatment well    Behavior During Therapy WFL for tasks assessed/performed               Past Medical History:  Diagnosis Date   A-fib (HCC)    cardioversion x2   Ankylosing spondylitis (HCC)    Atrial flutter (HCC)    GERD (gastroesophageal reflux disease)    Hemorrhoids    Irregular heart beat    MVP (mitral valve prolapse)    WITH A MIDSYSTOLIC CLICK   OSA (obstructive sleep apnea)    uses CPAP nightly   Peripheral neuropathy    Vitamin B12 deficiency    Past Surgical History:  Procedure Laterality Date   ABLATION     CARDIOVERSION N/A 12/20/2019   Procedure: CARDIOVERSION;  Surgeon: [name], MD;  Location: [facility];  Service: Cardiovascular;  Laterality: N/A;   COLONOSCOPY     INGUINAL HERNIA REPAIR     LESION REMOVAL N/A 05/22/2022   Procedure: EXCISION UPPER BACK SEBACEOUS CYST;  Surgeon: [name], MD;  Location: [facility];  Service: General;  Laterality: N/A;  LOCAL ANESTHESIA   venous ligation     for varices left lower leg   Patient Active Problem List   Diagnosis Date Noted   Sciatica of left side associated with disorder of lumbar spine 02/22/2024   Abnormality of gait 06/26/2021   Chronic hoarseness 10/04/2020   PAF (paroxysmal atrial fibrillation) (HCC)    DDD (degenerative disc disease),  lumbar 10/28/2017   Bilateral bunions 08/20/2016   Varicose veins of right lower extremity with complications 01/07/2016   Bruit 10/10/2015   Varicose veins of leg with complications 10/01/2015   Ankylosing spondylitis (HCC) 01/14/2015   Chronic anticoagulation 01/14/2015   Sleep apnea 01/14/2015   S/P ablation of atrial fibrillation 01/08/2015   Persistent atrial fibrillation (HCC) 01/08/2015   Orthostatic dizziness 12/01/2013   Calf pain 11/22/2012   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/22/2012   Peripheral neuropathy 10/21/2012   Pain in joint, ankle and foot 09/21/2012   Disturbance of skin sensation 09/21/2012   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 09/21/2012   Other general symptoms(780.99) 09/21/2012   Heel pain 06/07/2012   Plantar fasciitis 06/07/2012   Mitral valve prolapse 06/03/2011   Pain in joint of right shoulder 04/03/2011   Rotator cuff syndrome of right shoulder 04/03/2011   Paroxysmal atrial fibrillation (HCC) 01/20/2011   Hypercholesterolemia 01/20/2011   OBSTRUCTIVE SLEEP APNEA 10/08/2009    PCP: [name] MD   REFERRING PROVIDER: [name] MD/ [name] MD   REFERRING DIAG: [phone] (ICD-10-CM) - Left sided sciatica  THERAPY DIAG:  Muscle weakness (generalized)  Other abnormalities of gait and mobility  Abnormal posture  Pain in left leg  Rationale for Evaluation  and Treatment: Rehabilitation  ONSET DATE: acute on chronic   SUBJECTIVE:   SUBJECTIVE STATEMENT: I feel 75% better,  The sciatic pain is only L lower leg, no more thigh pain really.     PERTINENT HISTORY: Dr. Harvey note  had sent him to neuro PT because of what I saw is a high fall risk No recent falls but he feels unsteady at times and even has had some retropulsion Today he has dynamic genu valgus on the right with hip abduction weakness I gave him a hip abduction series but want him to pursue this at physical therapy until we can get improved right hip  strength  Patient saw Dr. Teressa 2 weeks ago for worsening sciatica that left leg.  He has a progressive peripheral neuropathy.  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  Is been thought that his radicular symptoms probably originate in his lumbar spine.   PAIN:  Are you having pain? Yes: NPRS scale: early AM 3/10- 4/10 , none today   Pain location: L thigh, buttock  Pain description: aching  Aggravating factors: standing too long (> 30) , sitting too long with knee flexed , can be 8/10  Relieving factors: gabapentin , stretching , moving , figure 4 stretch    PRECAUTIONS: None monitor balance  RED FLAGS: None   WEIGHT BEARING RESTRICTIONS: No  FALLS:  Has patient fallen in last 6 months? No but does note worsening balance lately.   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  PATIENT GOALS: To be rid of the pain   NEXT MD VISIT: as needed   OBJECTIVE:   Note: Objective measures were completed at Evaluation unless otherwise noted.  DIAGNOSTIC FINDINGS: none recent   PATIENT SURVEYS: NT     COGNITION: Overall cognitive status: Within functional limits for tasks assessed     SENSATION: Major numbness both legs, bottom 1/3 of my legs and feet  EDEMA:  None   MUSCLE LENGTH: Hamstrings: lacks about 10 deg each leg.  Rt LE SLR 55 and Lt. 45 deg  Thomas test: NT   POSTURE: rounded shoulders, forward head, increased thoracic kyphosis, posterior pelvic tilt, and flexed trunk   PALPATION: Pain to palpation of left piriformis but only minimal to moderate No pain in the left hamstring or calf  LOWER EXTREMITY ROM: Within functional limits Patient notices a greater sense of  tightness in the left hamstring with passive stretching  Active ROM Right eval Left eval  Hip flexion    Hip extension    Hip abduction    Hip adduction    Hip internal rotation    Hip external rotation    Knee flexion    Knee extension    Ankle dorsiflexion    Ankle plantarflexion    Ankle inversion    Ankle eversion     (Blank rows = not tested)  LOWER EXTREMITY MMT:  MMT Right eval Left eval Rt./Lt.  9/26  Hip flexion 5 5   Hip extension 4 4 4+/5, 4+/5  Hip abduction 3+ 4 3+/5, 4/5  Hip adduction     Hip internal rotation     Hip external rotation  Knee flexion 5 4+   Knee extension 5 4+   Ankle dorsiflexion     Ankle plantarflexion     Ankle inversion     Ankle eversion      (Blank rows = not tested)  LOWER EXTREMITY SPECIAL TESTS:  NT   FUNCTIONAL TESTS:  5 times sit to stand: 17 sec  04/21/24: 5 X STS 12 sec  Needs UE assit to balance on one leg , hip compensation, trunk lean   GAIT: Distance walked: 150 Assistive device utilized: None Level of assistance: Modified independence Comments: Trunk flexed upper back rounded and shoulders forward.  Apparent decrease in proprioception in the lower body with decreased hip stability lack of arm swing and right foot slight lead turn and word                                                                                                                                TREATMENT DATE:    [facility] Adult PT Treatment:                                                DATE: 04/27/24 Therapeutic Exercise: Calf Raise in standing, multiple positions- stronger in LLE  Seated calf raise 25 lbs x 15  Supine hamstring/ITB x 2 each  Supine 10 lbs bridge x 10 Single leg 10 lbs x 10 in fig 4  Therapeutic Activity: Obstacles step over facing forward and then laterall, min A  Lateral step ups  Hip abduction x 15  Plank modified at high table    [facility] Adult PT Treatment:                                                DATE:  04/21/24 Therapeutic Exercise: TM 2.0 mph warm up focus on heel strike and upright posture, 6 min   Neuromuscular re-ed: Pilates Tower for LE/Core strength, postural strength, lumbopelvic disassociation and core control.  Exercises included: Supine Leg Springs  Single leg arcs Double leg arcs and circles  Sidelying Leg Springs Hip add/abd Sidekicks Circles  Arm [employer] Bridging with arcs  90/90 arcs      [facility] Adult PT Treatment:                                                DATE: 04/18/24 Neuromuscular re-ed: Pilates Reformer used for LE/core strength, postural strength, lumbopelvic disassociation and core control.  Exercises included: Footwork 2 red 1 blue 1 yellow with GTB around thighs  Double leg Parallel heels  x 20 added GTB Single leg  Heel in parallel and turnout 2 red 1 blue  Double leg heel raise and calf stretch, prancing Clam GTB double leg then single leg  Bridging 4 springs x 5 partial with GTB Supine Arm Arcs 1 red 1 yellow  Circles , caution to  Feet in Straps 1 red 1 yellow Arcs and Frog squat  Self Care: Considering cane Goal check    PATIENT EDUCATION:  Education details: See above self-care Person educated: Patient Education method: Explanation, Demonstration, Tactile cues, Verbal cues, and Handouts Education comprehension: verbalized understanding and needs further education  HOME EXERCISE PROGRAM: Access Code: [email or web address] URL: [email or web address] Date: 03/14/2024 Prepared by: Delon Norma  Exercises - Sidelying Hip Abduction  - 1 x daily - 7 x weekly - 3 sets - 10 reps - Sidestepping  - 1 x daily - 7 x weekly - 3 sets - 10 reps - Single Leg Stance with Support  - 1 x daily - 7 x weekly - 1 sets - 3-5 reps - 30 hold - Standing Hip Abduction with Resistance at Ankles and Counter Support  - 1 x daily - 7 x weekly - 2 sets - 10 reps - 5 hold - Hip Extension with Resistance Loop  - 1 x daily - 7 x weekly - 2 sets - 10 reps -  5 hold - calf raise    ASSESSMENT:  CLINICAL IMPRESSION:  Patient still noting limitations in balance, improving in L Leg symptoms.  He cont to work hard with his daily program.  Discussed what he should focus on to maximize his outcomes.  Reinforced that the hips are the weak link. Needs min A  with obstacles to avoid falling .  Patient will continue to benefit from skilled PT in order to return to PLOF and optimize functional mobility.      Last session: Session emphasized balance and hip strength today, reviewed home routine and offer corrections to include increasing the time spent with his head turn in order to fully integrate the balance challenge.  He was able to eventually stand on 1 leg for about 10 seconds but the left side is markedly less stable.  Patient notes neuropathy may be getting worse and is definitely impacting his mobility.  He needs to visually see each step which lends itself to a bent over posture with gait.  He has decreased heel strike and walks with a heavy step.  We practiced walking with a cane and it did improve his stability.  He may be more open to consider a walking pole to improve his upright posture as well as using it for a balance tool at home.  Patient will continue to benefit from skilled PT in order to return to PLOF and optimize functional mobility.         OBJECTIVE IMPAIRMENTS: Abnormal gait, decreased balance, decreased coordination, decreased knowledge of condition, decreased mobility, difficulty walking, decreased strength, increased fascial restrictions, impaired flexibility, impaired UE functional use, improper body mechanics, postural dysfunction, and pain.   ACTIVITY LIMITATIONS: lifting, standing, stairs, transfers, and locomotion level  PARTICIPATION LIMITATIONS: community activity and occupation  PERSONAL FACTORS: Age, Time since onset of injury/illness/exacerbation, and 3+ comorbidities: AS, A-fib, Neuropathy are also affecting patient's  functional outcome.   REHAB POTENTIAL: Excellent  CLINICAL DECISION MAKING: Evolving/moderate complexity  EVALUATION COMPLEXITY: Moderate   GOALS: Goals reviewed with patient? Yes  SHORT TERM GOALS: Target date: 04/07/2024   Patient will be able to show independence  for initial HEP to include posture, core and hip strength and stability.   Baseline: Goal status:MET   2.  Patient will be complete functional mobility testing/balance and goal set  Baseline:  Goal status:MET   3.  Pt will modify workout schedule to a strength focus Baseline:  Goal status:MET    LONG TERM GOALS: Target date: 05/05/2024    Patient will be independent with final HEP upon discharge from PT and report consistent benefit following exercise completion.    Baseline:  Goal status: ongoing   2.  Pt will be able to demonstrate good standing balance as evidenced by balance recovery from min to mod dynamic balance challenges.   Baseline:  Goal status: ongoing   3.  Patient will be able to demonstrate hip/knee/ankle strength to 5/5 in order to maximize functional mobility, ambulation and lifting.   Baseline:  Goal status:ongoing   4.  Patient will be able to perform sit to stand  x 10 in 30 sec Baseline: 18 sec x 5  Goal status: INITIAL  5.  Patient will feel more confident and stable in his balance when at church.   Baseline: reports was bad in 2020 but since he is working on it, this has been maintained currently.  Less balance confidence when dynamic.  Goal status:ongoing   6.  Pt will be able to stand for 30 min without increased LLE pain.  Baseline: does standing inc LE pain  Goal status:ongoing    PLAN:  PT FREQUENCY: 1-2x/week  PT DURATION: 8 weeks   PLANNED INTERVENTIONS: 97164- PT Re-evaluation, 97750- Physical Performance Testing, 97110-Therapeutic exercises, 97530- Therapeutic activity, 97112- Neuromuscular re-education, 97535- Self Care, [id number]- Manual therapy, [phone]- Gait  training, [phone]- Aquatic Therapy, [phone] (1-2 muscles), [id number] (3+ muscles)- Dry Needling, Patient/Family education, Balance training, Taping, Spinal mobilization, Cryotherapy, and Moist heat  PLAN FOR NEXT SESSION:   L balance test strength in LE, hips and core , posture   Derwin Reddy, PT 04/27/2024, 11:05 AM   Delon Norma, PT 04/27/24 11:05 AM Phone: [phone] Fax: [phone]

## 2024-05-02 ENCOUNTER — Encounter: Admitting: Physical Therapy

## 2024-05-02 NOTE — Therapy (Deleted)
 OUTPATIENT PHYSICAL THERAPY LOWER EXTREMITY NOTE    Patient Name: Wesley [name]. MRN: [id number] DOB:[birth year], 83 y.o., male Today's Date: 05/02/2024     Progress Note Reporting Period  03/10/24 to 04/21/24  See note below for Objective Data and Assessment of Progress/Goals.    END OF SESSION:         Past Medical History:  Diagnosis Date   A-fib [facility])    cardioversion x2   Ankylosing spondylitis (HCC)    Atrial flutter (HCC)    GERD (gastroesophageal reflux disease)    Hemorrhoids    Irregular heart beat    MVP (mitral valve prolapse)    WITH A MIDSYSTOLIC CLICK   OSA (obstructive sleep apnea)    uses CPAP nightly   Peripheral neuropathy    Vitamin B12 deficiency    Past Surgical History:  Procedure Laterality Date   ABLATION     CARDIOVERSION N/A 12/20/2019   Procedure: CARDIOVERSION;  Surgeon: [name], MD;  Location: [facility];  Service: Cardiovascular;  Laterality: N/A;   COLONOSCOPY     INGUINAL HERNIA REPAIR     LESION REMOVAL N/A 05/22/2022   Procedure: EXCISION UPPER BACK SEBACEOUS CYST;  Surgeon: [name], MD;  Location: [facility];  Service: General;  Laterality: N/A;  LOCAL ANESTHESIA   venous ligation     for varices left lower leg   Patient Active Problem List   Diagnosis Date Noted   Sciatica of left side associated with disorder of lumbar spine 02/22/2024   Abnormality of gait 06/26/2021   Chronic hoarseness 10/04/2020   PAF (paroxysmal atrial fibrillation) (HCC)    DDD (degenerative disc disease), lumbar 10/28/2017   Bilateral bunions 08/20/2016   Varicose veins of right lower extremity with complications 01/07/2016   Bruit 10/10/2015   Varicose veins of leg with complications 10/01/2015   Ankylosing spondylitis (HCC) 01/14/2015   Chronic anticoagulation 01/14/2015   Sleep apnea 01/14/2015   S/P ablation of atrial fibrillation 01/08/2015   Persistent atrial fibrillation (HCC) 01/08/2015   Orthostatic  dizziness 12/01/2013   Calf pain 11/22/2012   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/22/2012   Peripheral neuropathy 10/21/2012   Pain in joint, ankle and foot 09/21/2012   Disturbance of skin sensation 09/21/2012   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 09/21/2012   Other general symptoms(780.99) 09/21/2012   Heel pain 06/07/2012   Plantar fasciitis 06/07/2012   Mitral valve prolapse 06/03/2011   Pain in joint of right shoulder 04/03/2011   Rotator cuff syndrome of right shoulder 04/03/2011   Paroxysmal atrial fibrillation (HCC) 01/20/2011   Hypercholesterolemia 01/20/2011   OBSTRUCTIVE SLEEP APNEA 10/08/2009    PCP: [name] MD   REFERRING PROVIDER: [name] MD/ [name] MD   REFERRING DIAG: [id number] (ICD-10-CM) - Left sided sciatica  THERAPY DIAG:  No diagnosis found.  Rationale for Evaluation and Treatment: Rehabilitation  ONSET DATE: acute on chronic   SUBJECTIVE:   SUBJECTIVE STATEMENT: I feel 75% better,  The sciatic pain is only L lower leg, no more thigh pain really.     PERTINENT HISTORY: Dr. Harvey note  had sent him to neuro PT because of what I saw is a high fall risk No recent falls but he feels unsteady at times and even has had some retropulsion Today he has dynamic genu valgus on the right with hip abduction weakness I gave him a hip abduction series but want him to pursue this at physical therapy until we can get improved  right hip strength  Patient saw Dr. Teressa 2 weeks ago for worsening sciatica that left leg.  He has a progressive peripheral neuropathy.  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  Is been thought that his radicular symptoms probably originate in his lumbar spine.   PAIN:  Are you having pain? Yes: NPRS scale: early AM 3/10- 4/10 , none today   Pain location: L thigh, buttock  Pain description: aching  Aggravating factors: standing too long (> 30) , sitting too long with knee flexed ,  can be 8/10  Relieving factors: gabapentin , stretching , moving , figure 4 stretch    PRECAUTIONS: None monitor balance  RED FLAGS: None   WEIGHT BEARING RESTRICTIONS: No  FALLS:  Has patient fallen in last 6 months? No but does note worsening balance lately.   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  PATIENT GOALS: To be rid of the pain   NEXT MD VISIT: as needed   OBJECTIVE:   Note: Objective measures were completed at Evaluation unless otherwise noted.  DIAGNOSTIC FINDINGS: none recent   PATIENT SURVEYS: NT     COGNITION: Overall cognitive status: Within functional limits for tasks assessed     SENSATION: Major numbness both legs, bottom 1/3 of my legs and feet  EDEMA:  None   MUSCLE LENGTH: Hamstrings: lacks about 10 deg each leg.  Rt LE SLR 55 and Lt. 45 deg  Thomas test: NT   POSTURE: rounded shoulders, forward head, increased thoracic kyphosis, posterior pelvic tilt, and flexed trunk   PALPATION: Pain to palpation of left piriformis but only minimal to moderate No pain in the left hamstring or calf  LOWER EXTREMITY ROM: Within functional limits Patient notices a greater sense of tightness in the left hamstring with passive stretching  Active ROM Right eval Left eval  Hip flexion    Hip extension    Hip abduction    Hip adduction    Hip internal rotation    Hip external rotation    Knee flexion    Knee extension    Ankle dorsiflexion    Ankle plantarflexion    Ankle inversion    Ankle eversion     (Blank rows = not tested)  LOWER EXTREMITY MMT:  MMT Right eval Left eval Rt./Lt.  9/26  Hip flexion 5 5   Hip extension 4 4 4+/5, 4+/5  Hip abduction 3+ 4 3+/5, 4/5  Hip  adduction     Hip internal rotation     Hip external rotation     Knee flexion 5 4+   Knee extension 5 4+   Ankle dorsiflexion     Ankle plantarflexion     Ankle inversion     Ankle eversion      (Blank rows = not tested)  LOWER EXTREMITY SPECIAL TESTS:  NT   FUNCTIONAL TESTS:  5 times sit to stand: 17 sec  04/21/24: 5 X STS 12 sec  Needs UE assit to balance on one leg , hip compensation, trunk lean   GAIT: Distance walked: 150 Assistive device utilized: None Level of assistance: Modified independence Comments: Trunk flexed upper back rounded and shoulders forward.  Apparent decrease  in proprioception in the lower body with decreased hip stability lack of arm swing and right foot slight lead turn and word                                                                                                                                TREATMENT DATE:   [facility] Adult PT Treatment:                                                DATE: 05/02/24 Therapeutic Exercise: *** Manual Therapy: *** Neuromuscular re-ed: *** Therapeutic Activity: *** Modalities: *** Self Care: ***  [facility] Adult PT Treatment:                                                DATE: 04/27/24 Therapeutic Exercise: Calf Raise in standing, multiple positions- stronger in LLE  Seated calf raise 25 lbs x 15  Supine hamstring/ITB x 2 each  Supine 10 lbs bridge x 10 Single leg 10 lbs x 10 in fig 4  Therapeutic Activity: Obstacles step over facing forward and then laterall, min A  Lateral step ups  Hip abduction x 15  Plank modified at high table    [facility] Adult PT Treatment:                                                DATE: 04/21/24 Therapeutic Exercise: TM 2.0 mph warm up focus on heel strike and upright posture, 6 min   Neuromuscular re-ed: Pilates Tower for LE/Core strength, postural strength, lumbopelvic disassociation and core control.  Exercises included: Supine Leg Springs  Single leg arcs Double leg  arcs and circles  Sidelying Leg Springs Hip add/abd Sidekicks Circles  Arm [employer] Bridging with arcs  90/90 arcs      [facility] Adult PT Treatment:                                                DATE: 04/18/24 Neuromuscular re-ed: Pilates Reformer used for LE/core strength, postural strength, lumbopelvic disassociation and core control.  Exercises included: Footwork 2 red 1 blue 1 yellow with GTB around thighs  Double leg Parallel heels x 20 added GTB Single leg  Heel in parallel and turnout 2 red 1 blue  Double leg heel raise and calf stretch, prancing Clam GTB double leg then single leg  Bridging 4 springs x 5 partial  with GTB Supine Arm Arcs 1 red 1 yellow  Circles , caution to  Feet in Straps 1 red 1 yellow Arcs and Frog squat  Self Care: Considering cane Goal check    PATIENT EDUCATION:  Education details: See above self-care Person educated: Patient Education method: Explanation, Demonstration, Tactile cues, Verbal cues, and Handouts Education comprehension: verbalized understanding and needs further education  HOME EXERCISE PROGRAM: Access Code: [email or web address] URL: [email or web address] Date: 03/14/2024 Prepared by: Delon Norma  Exercises - Sidelying Hip Abduction  - 1 x daily - 7 x weekly - 3 sets - 10 reps - Sidestepping  - 1 x daily - 7 x weekly - 3 sets - 10 reps - Single Leg Stance with Support  - 1 x daily - 7 x weekly - 1 sets - 3-5 reps - 30 hold - Standing Hip Abduction with Resistance at Ankles and Counter Support  - 1 x daily - 7 x weekly - 2 sets - 10 reps - 5 hold - Hip Extension with Resistance Loop  - 1 x daily - 7 x weekly - 2 sets - 10 reps - 5 hold - calf raise    ASSESSMENT:  CLINICAL IMPRESSION:  Patient still noting limitations in balance, improving in L Leg symptoms.  He cont to work hard with his daily program.  Discussed what he should focus on to maximize his outcomes.  Reinforced that the hips are the weak link.  Needs min A  with obstacles to avoid falling .  Patient will continue to benefit from skilled PT in order to return to PLOF and optimize functional mobility.      Last session: Session emphasized balance and hip strength today, reviewed home routine and offer corrections to include increasing the time spent with his head turn in order to fully integrate the balance challenge.  He was able to eventually stand on 1 leg for about 10 seconds but the left side is markedly less stable.  Patient notes neuropathy may be getting worse and is definitely impacting his mobility.  He needs to visually see each step which lends itself to a bent over posture with gait.  He has decreased heel strike and walks with a heavy step.  We practiced walking with a cane and it did improve his stability.  He may be more open to consider a walking pole to improve his upright posture as well as using it for a balance tool at home.  Patient will continue to benefit from skilled PT in order to return to PLOF and optimize functional mobility.         OBJECTIVE IMPAIRMENTS: Abnormal gait, decreased balance, decreased coordination, decreased knowledge of condition, decreased mobility, difficulty walking, decreased strength, increased fascial restrictions, impaired flexibility, impaired UE functional use, improper body mechanics, postural dysfunction, and pain.   ACTIVITY LIMITATIONS: lifting, standing, stairs, transfers, and locomotion level  PARTICIPATION LIMITATIONS: community activity and occupation  PERSONAL FACTORS: Age, Time since onset of injury/illness/exacerbation, and 3+ comorbidities: AS, A-fib, Neuropathy are also affecting patient's functional outcome.   REHAB POTENTIAL: Excellent  CLINICAL DECISION MAKING: Evolving/moderate complexity  EVALUATION COMPLEXITY: Moderate   GOALS: Goals reviewed with patient? Yes  SHORT TERM GOALS: Target date: 04/07/2024   Patient will be able to show independence for initial HEP  to include posture, core and hip strength and stability.   Baseline: Goal status:MET   2.  Patient will be complete functional mobility testing/balance and goal set  Baseline:  Goal status:MET   3.  Pt will modify workout schedule to a strength focus Baseline:  Goal status:MET    LONG TERM GOALS: Target date: 05/05/2024    Patient will be independent with final HEP upon discharge from PT and report consistent benefit following exercise completion.    Baseline:  Goal status: ongoing   2.  Pt will be able to demonstrate good standing balance as evidenced by balance recovery from min to mod dynamic balance challenges.   Baseline:  Goal status: ongoing   3.  Patient will be able to demonstrate hip/knee/ankle strength to 5/5 in order to maximize functional mobility, ambulation and lifting.   Baseline:  Goal status:ongoing   4.  Patient will be able to perform sit to stand  x 10 in 30 sec Baseline: 18 sec x 5  Goal status: INITIAL  5.  Patient will feel more confident and stable in his balance when at church.   Baseline: reports was bad in 2020 but since he is working on it, this has been maintained currently.  Less balance confidence when dynamic.  Goal status:ongoing   6.  Pt will be able to stand for 30 min without increased LLE pain.  Baseline: does standing inc LE pain  Goal status:ongoing    PLAN:  PT FREQUENCY: 1-2x/week  PT DURATION: 8 weeks   PLANNED INTERVENTIONS: 97164- PT Re-evaluation, 97750- Physical Performance Testing, 97110-Therapeutic exercises, 97530- Therapeutic activity, 97112- Neuromuscular re-education, 97535- Self Care, [id number]- Manual therapy, [phone]- Gait training, [phone]- Aquatic Therapy, [phone] (1-2 muscles), [phone] (3+ muscles)- Dry Needling, Patient/Family education, Balance training, Taping, Spinal mobilization, Cryotherapy, and Moist heat  PLAN FOR NEXT SESSION:   L balance test strength in LE, hips and core , posture   Princetta Uplinger,  PT 05/02/2024, 7:48 AM   Delon Norma, PT 05/02/24 7:48 AM Phone: [phone] Fax: [phone]

## 2024-05-04 ENCOUNTER — Ambulatory Visit (INDEPENDENT_AMBULATORY_CARE_PROVIDER_SITE_OTHER): Admitting: Physical Therapy

## 2024-05-04 ENCOUNTER — Encounter: Payer: Self-pay | Admitting: Physical Therapy

## 2024-05-04 DIAGNOSIS — M6281 Muscle weakness (generalized): Secondary | ICD-10-CM | POA: Diagnosis not present

## 2024-05-04 DIAGNOSIS — M79605 Pain in left leg: Secondary | ICD-10-CM | POA: Diagnosis not present

## 2024-05-04 DIAGNOSIS — R293 Abnormal posture: Secondary | ICD-10-CM | POA: Diagnosis not present

## 2024-05-04 DIAGNOSIS — R2689 Other abnormalities of gait and mobility: Secondary | ICD-10-CM

## 2024-05-04 NOTE — Therapy (Signed)
 OUTPATIENT PHYSICAL THERAPY LOWER EXTREMITY  Re-Evaluation   Patient Name: Wesley [name]. MRN: [id number] DOB:05-24-1941, 83 y.o., male Today's Date: 05/04/2024      END OF SESSION:  PT End of Session - 05/04/24 1018     Visit Number 12    Number of Visits 20    Date for Recertification  06/29/24    Authorization Type Medicare, [employer]    PT Start Time 1014    PT Stop Time 1100    PT Time Calculation (min) 46 min    Activity Tolerance Patient tolerated treatment well    Behavior During Therapy WFL for tasks assessed/performed                Past Medical History:  Diagnosis Date   A-fib (HCC)    cardioversion x2   Ankylosing spondylitis (HCC)    Atrial flutter (HCC)    GERD (gastroesophageal reflux disease)    Hemorrhoids    Irregular heart beat    MVP (mitral valve prolapse)    WITH A MIDSYSTOLIC CLICK   OSA (obstructive sleep apnea)    uses CPAP nightly   Peripheral neuropathy    Vitamin B12 deficiency    Past Surgical History:  Procedure Laterality Date   ABLATION     CARDIOVERSION N/A 12/20/2019   Procedure: CARDIOVERSION;  Surgeon: [name], MD;  Location: [facility];  Service: Cardiovascular;  Laterality: N/A;   COLONOSCOPY     INGUINAL HERNIA REPAIR     LESION REMOVAL N/A 05/22/2022   Procedure: EXCISION UPPER BACK SEBACEOUS CYST;  Surgeon: [name], MD;  Location: [facility];  Service: General;  Laterality: N/A;  LOCAL ANESTHESIA   venous ligation     for varices left lower leg   Patient Active Problem List   Diagnosis Date Noted   Sciatica of left side associated with disorder of lumbar spine 02/22/2024   Abnormality of gait 06/26/2021   Chronic hoarseness 10/04/2020   PAF (paroxysmal atrial fibrillation) (HCC)    DDD (degenerative disc disease), lumbar 10/28/2017   Bilateral bunions 08/20/2016   Varicose veins of right lower extremity with complications 01/07/2016   Bruit 10/10/2015   Varicose veins of leg  with complications 10/01/2015   Ankylosing spondylitis (HCC) 01/14/2015   Chronic anticoagulation 01/14/2015   Sleep apnea 01/14/2015   S/P ablation of atrial fibrillation 01/08/2015   Persistent atrial fibrillation (HCC) 01/08/2015   Orthostatic dizziness 12/01/2013   Calf pain 11/22/2012   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/22/2012   Peripheral neuropathy 10/21/2012   Pain in joint, ankle and foot 09/21/2012   Disturbance of skin sensation 09/21/2012   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 09/21/2012   Other general symptoms(780.99) 09/21/2012   Heel pain 06/07/2012   Plantar fasciitis 06/07/2012   Mitral valve prolapse 06/03/2011   Pain in joint of right shoulder 04/03/2011   Rotator cuff syndrome of right shoulder 04/03/2011   Paroxysmal atrial fibrillation (HCC) 01/20/2011   Hypercholesterolemia 01/20/2011   OBSTRUCTIVE SLEEP APNEA 10/08/2009    PCP: [name] MD   REFERRING PROVIDER: [name] MD/ [name] MD   REFERRING DIAG: [phone] (ICD-10-CM) - Left sided sciatica  THERAPY DIAG:  Muscle weakness (generalized)  Other abnormalities of gait and mobility  Abnormal posture  Pain in left leg  Rationale for Evaluation and Treatment: Rehabilitation  ONSET DATE: acute on chronic   SUBJECTIVE:   SUBJECTIVE STATEMENT: I really did something the other night.  I was stretching with my  band and the back of my leg hurt so bad.  Could hardly walk, lasted a couple of days , walking with a cane.  Had to cancel the other day.  Pt thinks it is his sciatic pain.  Was dizzy last night.      PERTINENT HISTORY: Dr. Harvey note  had sent him to neuro PT because of what I saw is a high fall risk No recent falls but he feels unsteady at times and even has had some retropulsion Today he has dynamic genu valgus on the right with hip abduction weakness I gave him a hip abduction series but want him to pursue this at physical therapy until we can get improved right  hip strength  Patient saw Dr. Teressa 2 weeks ago for worsening sciatica that left leg.  He has a progressive peripheral neuropathy.  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  Is been thought that his radicular symptoms probably originate in his lumbar spine.   PAIN:  Are you having pain? Yes: NPRS scale: 7/10 Tue was 9/10    Pain location: L post thigh  Pain description: aching  Aggravating factors: sitting with knee flexed , overstretch  Relieving factors: gabapentin , stretching , moving , figure 4 stretch    PRECAUTIONS: None monitor balance  RED FLAGS: None   WEIGHT BEARING RESTRICTIONS: No  FALLS:  Has patient fallen in last 6 months? No but does note worsening balance lately.   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  PATIENT GOALS: To be rid of the pain   NEXT MD VISIT: as needed   OBJECTIVE:   Note: Objective measures were completed at Evaluation unless otherwise noted.  DIAGNOSTIC FINDINGS: none recent   PATIENT SURVEYS: NT     COGNITION: Overall cognitive status: Within functional limits for tasks assessed     SENSATION: Major numbness both legs, bottom 1/3 of my legs and feet  EDEMA:  None   MUSCLE LENGTH: Hamstrings: lacks about 10 deg each leg.  Rt LE SLR 55 and Lt. 45 deg  Thomas test: NT   POSTURE: rounded shoulders, forward head, increased thoracic kyphosis, posterior pelvic tilt, and flexed trunk   PALPATION: Pain to palpation of left piriformis but only minimal to moderate No pain in the left hamstring or calf  LOWER EXTREMITY ROM: Within functional limits Patient notices a greater sense of tightness in the left hamstring with passive  stretching  Active ROM Right eval Left eval  Hip flexion    Hip extension    Hip abduction    Hip adduction    Hip internal rotation    Hip external rotation    Knee flexion    Knee extension    Ankle dorsiflexion    Ankle plantarflexion    Ankle inversion    Ankle eversion     (Blank rows = not tested)  LOWER EXTREMITY MMT:  MMT Right eval Left eval Rt./Lt.  05/04/24  Hip flexion 5 5   Hip extension 4 4 4+/5, 4+/5  Hip abduction 3+ 4 3+/5, 4/5  Hip adduction     Hip internal rotation     Hip external rotation     Knee  flexion 5 4+ 5/5, 4+/5  Knee extension 5 4+ 5/5, 4+/5  Ankle dorsiflexion     Ankle plantarflexion     Ankle inversion     Ankle eversion      (Blank rows = not tested)  LOWER EXTREMITY SPECIAL TESTS:  NT   FUNCTIONAL TESTS:  5 times sit to stand: 17 sec  04/21/24: 5 X STS 12 sec  Needs UE assit to balance on one leg , hip compensation, trunk lean   GAIT: Distance walked: 150 Assistive device utilized: None Level of assistance: Modified independence Comments: Trunk flexed upper back rounded and shoulders forward.  Apparent decrease in proprioception in the lower body with decreased hip stability lack of arm swing and right foot slight lead turn and word                                                                                                                                TREATMENT DATE:   [facility] Adult PT Treatment:                                                DATE: 05/04/24 Therapeutic Exercise: Supine knee to chest  Supported knee extension  Bridging Sit to stand Manual Therapy: P prone soft tissue work to the left hamstring gastrocsoleus.  Pain mostly in the medial hamstring  Pin and stretch left hamstring  Self Care: Differential diagnosis Options for stretching the left leg gently without over doing it  [facility] Adult PT Treatment:                                                DATE: 04/27/24 Therapeutic Exercise: Calf Raise  in standing, multiple positions- stronger in LLE  Seated calf raise 25 lbs x 15  Supine hamstring/ITB x 2 each  Supine 10 lbs bridge x 10 Single leg 10 lbs x 10 in fig 4  Therapeutic Activity: Obstacles step over facing forward and then laterall, min A  Lateral step ups  Hip abduction x 15  Plank modified at high table    [facility] Adult PT Treatment:                                                DATE: 04/21/24 Therapeutic Exercise: TM 2.0 mph warm up focus on heel strike and upright posture, 6 min   Neuromuscular re-ed: Pilates Tower for LE/Core strength, postural strength, lumbopelvic disassociation and core control.  Exercises included: Supine Leg [place]  Single leg arcs Double leg arcs and circles  Sidelying Leg [place] Hip add/abd [employer]  Arm [employer] with arcs  90/90 arcs       PATIENT EDUCATION:  Education details: See above self-care Person educated: Patient Education method: Explanation, Demonstration, Tactile cues, Verbal cues, and Handouts Education comprehension: verbalized understanding and needs further education  HOME EXERCISE PROGRAM: Access Code: [email or web address] URL: [email or web address] Date: 03/14/2024 Prepared by: Delon Norma  Exercises - Sidelying Hip Abduction  - 1 x daily - 7 x weekly - 3 sets - 10 reps - Sidestepping  - 1 x daily - 7 x weekly - 3 sets - 10 reps - Single Leg Stance with Support  - 1 x daily - 7 x weekly - 1 sets - 3-5 reps - 30 hold - Standing Hip Abduction with Resistance at Ankles and Counter Support  - 1 x daily - 7 x weekly - 2 sets - 10 reps - 5 hold - Hip Extension with Resistance Loop  - 1 x daily - 7 x weekly - 2 sets - 10 reps - 5 hold - calf raise    ASSESSMENT:  CLINICAL IMPRESSION: Pain in L post knee worse with palpation, knee flexion, over-extension.  It is unclear if his issue is within the soft tissue surrounding the knee, the irriation of his sciatic nerve or his lumbar  radiculopathy. He was improved about 75% but now he feels he has lost some of that progress.    The plan is to continue PT to address functional deficits mentioned including lower extremity pain hip weakness gait instability and postural deficits.  LLE discomfort improved after manual therapy session today.      Last session: Session emphasized balance and hip strength today, reviewed home routine and offer corrections to include increasing the time spent with his head turn in order to fully integrate the balance challenge.  He was able to eventually stand on 1 leg for about 10 seconds but the left side is markedly less stable.  Patient notes neuropathy may be getting worse and is definitely impacting his mobility.  He needs to visually see each step which lends itself to a bent over posture with gait.  He has decreased heel strike and walks with a heavy step.  We practiced walking with a cane and it did improve his stability.  He may be more open to consider a walking pole to improve his upright posture as well as using it for a balance tool at home.  Patient will continue to benefit from skilled PT in order to return to PLOF and optimize functional mobility.         OBJECTIVE IMPAIRMENTS: Abnormal gait, decreased balance, decreased coordination, decreased knowledge of condition, decreased mobility, difficulty walking, decreased strength, increased fascial restrictions, impaired flexibility, impaired UE functional use, improper body mechanics, postural dysfunction, and pain.   ACTIVITY LIMITATIONS: lifting, standing, stairs, transfers, and locomotion level  PARTICIPATION LIMITATIONS: community activity and occupation  PERSONAL FACTORS: Age, Time since onset of injury/illness/exacerbation, and 3+ comorbidities: AS, A-fib, Neuropathy are also affecting patient's functional outcome.   REHAB POTENTIAL: Excellent  CLINICAL DECISION MAKING: Evolving/moderate complexity  EVALUATION COMPLEXITY:  Moderate   GOALS: Goals reviewed with patient? Yes  SHORT TERM GOALS: Target date: 04/07/2024   Patient will be able to show independence for initial HEP to include posture, core and hip strength and stability.   Baseline: Goal status:MET   2.  Patient will be complete functional mobility testing/balance and goal set  Baseline:  Goal status:MET  3.  Pt will modify workout schedule to a strength focus Baseline:  Goal status:MET    LONG TERM GOALS: Target date: 05/05/2024    Patient will be independent with final HEP upon discharge from PT and report consistent benefit following exercise completion.    Baseline:  Goal status: ongoing   2.  Pt will be able to demonstrate good standing balance as evidenced by balance recovery from min to mod dynamic balance challenges.   Baseline:  Goal status: ongoing   3.  Patient will be able to demonstrate hip/knee/ankle strength to 5/5 in order to maximize functional mobility, ambulation and lifting.   Baseline:  Goal status:ongoing   4.  Patient will be able to perform sit to stand x 10 in 30 sec Baseline: 18 sec x 5 , 16 reps in 30 sec  Goal status: MET   5.  Patient will feel more confident and stable in his balance when at church.   Baseline: reports was bad in 2020 but since he is working on it, this has been maintained currently.  Less balance confidence when dynamic.  Goal status: ongoing   6.  Pt will be able to stand for 30 min without increased LLE pain.  Baseline: does standing inc LE pain  Goal status: ongoing    PLAN:  PT FREQUENCY: 1-2x/week  PT DURATION: 8 weeks   PLANNED INTERVENTIONS: 97164- PT Re-evaluation, 97750- Physical Performance Testing, 97110-Therapeutic exercises, 97530- Therapeutic activity, [id number]- Neuromuscular re-education, 97535- Self Care, [id number]- Manual therapy, [id number]- Gait training, [phone]- Aquatic Therapy, [phone] (1-2 muscles), [id number] (3+ muscles)- Dry Needling, Patient/Family education, Balance  training, Taping, Spinal mobilization, Cryotherapy, and Moist heat  PLAN FOR NEXT SESSION:   update on LLE/knee. L balance test strength in LE, hips and core , posture   Daje Stark, PT 05/04/2024, 11:07 AM   Delon Norma, PT 05/04/24 11:07 AM Phone: [phone] Fax: [phone]

## 2024-05-11 DIAGNOSIS — I4819 Other persistent atrial fibrillation: Secondary | ICD-10-CM | POA: Diagnosis not present

## 2024-05-19 ENCOUNTER — Encounter: Admitting: Physical Therapy

## 2024-05-19 ENCOUNTER — Other Ambulatory Visit: Payer: Self-pay | Admitting: Cardiology

## 2024-05-19 DIAGNOSIS — I480 Paroxysmal atrial fibrillation: Secondary | ICD-10-CM

## 2024-05-19 NOTE — Telephone Encounter (Addendum)
 Eliquis  5mg  refill request received. Patient is 83 years old, weight-94.3kg, Crea-0.91 on 05/19/23 via [employer] from Dr. Monia office-need updated labs, Diagnosis-Afib, and last seen by Lum Louis on 02/16/24. Dose is appropriate based on dosing criteria.   Call PCP for updated labs.  Called PCP office and inquired about labs and was told he has a lab appointment pending on 06/01/24 and an appointment to follow. Will send in limited refill until labs are drawn in 2 weeks at PCP office.

## 2024-05-22 NOTE — Therapy (Unsigned)
 OUTPATIENT PHYSICAL THERAPY LOWER EXTREMITY  Re-Evaluation   Patient Name: Wesley [name]. MRN: [id number] DOB:1941-03-25, 83 y.o., male Today's Date: 05/22/2024      END OF SESSION:          Past Medical History:  Diagnosis Date   A-fib [facility])    cardioversion x2   Ankylosing spondylitis (HCC)    Atrial flutter (HCC)    GERD (gastroesophageal reflux disease)    Hemorrhoids    Irregular heart beat    MVP (mitral valve prolapse)    WITH A MIDSYSTOLIC CLICK   OSA (obstructive sleep apnea)    uses CPAP nightly   Peripheral neuropathy    Vitamin B12 deficiency    Past Surgical History:  Procedure Laterality Date   ABLATION     CARDIOVERSION N/A 12/20/2019   Procedure: CARDIOVERSION;  Surgeon: [name], MD;  Location: [facility];  Service: Cardiovascular;  Laterality: N/A;   COLONOSCOPY     INGUINAL HERNIA REPAIR     LESION REMOVAL N/A 05/22/2022   Procedure: EXCISION UPPER BACK SEBACEOUS CYST;  Surgeon: [name], MD;  Location: [facility];  Service: General;  Laterality: N/A;  LOCAL ANESTHESIA   venous ligation     for varices left lower leg   Patient Active Problem List   Diagnosis Date Noted   Sciatica of left side associated with disorder of lumbar spine 02/22/2024   Abnormality of gait 06/26/2021   Chronic hoarseness 10/04/2020   PAF (paroxysmal atrial fibrillation) (HCC)    DDD (degenerative disc disease), lumbar 10/28/2017   Bilateral bunions 08/20/2016   Varicose veins of right lower extremity with complications 01/07/2016   Bruit 10/10/2015   Varicose veins of leg with complications 10/01/2015   Ankylosing spondylitis (HCC) 01/14/2015   Chronic anticoagulation 01/14/2015   Sleep apnea 01/14/2015   S/P ablation of atrial fibrillation 01/08/2015   Persistent atrial fibrillation (HCC) 01/08/2015   Orthostatic dizziness 12/01/2013   Calf pain 11/22/2012   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/22/2012   Peripheral neuropathy  10/21/2012   Pain in joint, ankle and foot 09/21/2012   Disturbance of skin sensation 09/21/2012   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 09/21/2012   Other general symptoms(780.99) 09/21/2012   Heel pain 06/07/2012   Plantar fasciitis 06/07/2012   Mitral valve prolapse 06/03/2011   Pain in joint of right shoulder 04/03/2011   Rotator cuff syndrome of right shoulder 04/03/2011   Paroxysmal atrial fibrillation (HCC) 01/20/2011   Hypercholesterolemia 01/20/2011   OBSTRUCTIVE SLEEP APNEA 10/08/2009    PCP: [name] MD   REFERRING PROVIDER: [name] MD/ [name] MD   REFERRING DIAG: [phone] (ICD-10-CM) - Left sided sciatica  THERAPY DIAG:  No diagnosis found.  Rationale for Evaluation and Treatment: Rehabilitation  ONSET DATE: acute on chronic   SUBJECTIVE:   SUBJECTIVE STATEMENT: I really did something the other night.  I was stretching with my band and the back of my leg hurt so bad.  Could hardly walk, lasted a couple of days , walking with a cane.  Had to cancel the other day.  Pt thinks it is his sciatic pain.  Was dizzy last night.      PERTINENT HISTORY: Dr. Harvey note  had sent him to neuro PT because of what I saw is a high fall risk No recent falls but he feels unsteady at times and even has had some retropulsion Today he has dynamic genu valgus on the right with hip abduction weakness I  gave him a hip abduction series but want him to pursue this at physical therapy until we can get improved right hip strength  Patient saw Dr. Teressa 2 weeks ago for worsening sciatica that left leg.  He has a progressive peripheral neuropathy.  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  Is been thought that his radicular symptoms probably originate in his lumbar spine.   PAIN:  Are you having pain? Yes: NPRS scale: 7/10 Tue was 9/10    Pain location: L post thigh  Pain description: aching  Aggravating factors: sitting  with knee flexed , overstretch  Relieving factors: gabapentin , stretching , moving , figure 4 stretch    PRECAUTIONS: None monitor balance  RED FLAGS: None   WEIGHT BEARING RESTRICTIONS: No  FALLS:  Has patient fallen in last 6 months? No but does note worsening balance lately.   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  PATIENT GOALS: To be rid of the pain   NEXT MD VISIT: as needed   OBJECTIVE:   Note: Objective measures were completed at Evaluation unless otherwise noted.  DIAGNOSTIC FINDINGS: none recent   PATIENT SURVEYS: NT     COGNITION: Overall cognitive status: Within functional limits for tasks assessed     SENSATION: Major numbness both legs, bottom 1/3 of my legs and feet  EDEMA:  None   MUSCLE LENGTH: Hamstrings: lacks about 10 deg each leg.  Rt LE SLR 55 and Lt. 45 deg  Thomas test: NT   POSTURE: rounded shoulders, forward head, increased thoracic kyphosis, posterior pelvic tilt, and flexed trunk   PALPATION: Pain to palpation of left piriformis but only minimal to moderate No pain in the left hamstring or calf  LOWER EXTREMITY ROM: Within functional limits Patient notices a greater sense of tightness in the left hamstring with passive stretching  Active ROM Right eval Left eval  Hip flexion    Hip extension    Hip abduction    Hip adduction    Hip internal rotation    Hip external rotation    Knee flexion    Knee extension    Ankle dorsiflexion    Ankle plantarflexion    Ankle inversion    Ankle eversion     (Blank rows = not tested)  LOWER EXTREMITY MMT:  MMT Right eval Left eval Rt./Lt.  05/04/24  Hip flexion 5 5   Hip extension 4 4 4+/5, 4+/5  Hip abduction 3+  4 3+/5, 4/5  Hip adduction     Hip internal rotation     Hip external rotation     Knee flexion 5 4+ 5/5, 4+/5  Knee extension 5 4+ 5/5, 4+/5  Ankle dorsiflexion     Ankle plantarflexion     Ankle inversion     Ankle eversion      (Blank rows = not tested)  LOWER EXTREMITY SPECIAL TESTS:  NT   FUNCTIONAL TESTS:  5 times sit to stand: 17 sec  04/21/24: 5 X STS 12 sec  Needs UE assit to balance on one leg , hip compensation, trunk lean   GAIT: Distance walked: 150 Assistive device utilized: None Level of assistance: Modified independence Comments: Trunk  flexed upper back rounded and shoulders forward.  Apparent decrease in proprioception in the lower body with decreased hip stability lack of arm swing and right foot slight lead turn and word                                                                                                                                TREATMENT DATE:    [facility] Adult PT Treatment:                                                DATE: 05/23/24 Therapeutic Exercise: *** Manual Therapy: *** Neuromuscular re-ed: *** Therapeutic Activity: *** Modalities: *** Self Care: ***  [facility] Adult PT Treatment:                                                DATE: 05/04/24 Therapeutic Exercise: Supine knee to chest  Supported knee extension  Bridging Sit to stand Manual Therapy: P prone soft tissue work to the left hamstring gastrocsoleus.  Pain mostly in the medial hamstring  Pin and stretch left hamstring  Self Care: Differential diagnosis Options for stretching the left leg gently without over doing it  [facility] Adult PT Treatment:                                                DATE: 04/27/24 Therapeutic Exercise: Calf Raise in standing, multiple positions- stronger in LLE  Seated calf raise 25 lbs x 15  Supine hamstring/ITB x 2 each  Supine 10 lbs bridge x 10 Single leg 10 lbs x 10 in fig 4  Therapeutic Activity: Obstacles step over facing forward  and then laterall, min A  Lateral step ups  Hip abduction x 15  Plank modified at high table    [facility] Adult PT Treatment:                                                DATE: 04/21/24 Therapeutic Exercise: TM 2.0 mph warm up focus on heel strike and upright posture, 6 min   Neuromuscular re-ed: Pilates Tower for LE/Core strength, postural strength, lumbopelvic disassociation and core control.  Exercises included: Supine Leg [place]  Single leg arcs Double leg arcs and circles  Sidelying Leg [place] Hip add/abd Veterinary Surgeon  Arm [employer] with arcs  90/90 arcs       PATIENT EDUCATION:  Education  details: See above self-care Person educated: Patient Education method: Explanation, Demonstration, Tactile cues, Verbal cues, and Handouts Education comprehension: verbalized understanding and needs further education  HOME EXERCISE PROGRAM: Access Code: [email or web address] URL: [email or web address] Date: 03/14/2024 Prepared by: Delon Norma  Exercises - Sidelying Hip Abduction  - 1 x daily - 7 x weekly - 3 sets - 10 reps - Sidestepping  - 1 x daily - 7 x weekly - 3 sets - 10 reps - Single Leg Stance with Support  - 1 x daily - 7 x weekly - 1 sets - 3-5 reps - 30 hold - Standing Hip Abduction with Resistance at Ankles and Counter Support  - 1 x daily - 7 x weekly - 2 sets - 10 reps - 5 hold - Hip Extension with Resistance Loop  - 1 x daily - 7 x weekly - 2 sets - 10 reps - 5 hold - calf raise    ASSESSMENT:  CLINICAL IMPRESSION: Pain in L post knee worse with palpation, knee flexion, over-extension.  It is unclear if his issue is within the soft tissue surrounding the knee, the irriation of his sciatic nerve or his lumbar radiculopathy. He was improved about 75% but now he feels he has lost some of that progress.    The plan is to continue PT to address functional deficits mentioned including lower extremity pain hip weakness gait instability and  postural deficits.  LLE discomfort improved after manual therapy session today.      Last session: Session emphasized balance and hip strength today, reviewed home routine and offer corrections to include increasing the time spent with his head turn in order to fully integrate the balance challenge.  He was able to eventually stand on 1 leg for about 10 seconds but the left side is markedly less stable.  Patient notes neuropathy may be getting worse and is definitely impacting his mobility.  He needs to visually see each step which lends itself to a bent over posture with gait.  He has decreased heel strike and walks with a heavy step.  We practiced walking with a cane and it did improve his stability.  He may be more open to consider a walking pole to improve his upright posture as well as using it for a balance tool at home.  Patient will continue to benefit from skilled PT in order to return to PLOF and optimize functional mobility.         OBJECTIVE IMPAIRMENTS: Abnormal gait, decreased balance, decreased coordination, decreased knowledge of condition, decreased mobility, difficulty walking, decreased strength, increased fascial restrictions, impaired flexibility, impaired UE functional use, improper body mechanics, postural dysfunction, and pain.   ACTIVITY LIMITATIONS: lifting, standing, stairs, transfers, and locomotion level  PARTICIPATION LIMITATIONS: community activity and occupation  PERSONAL FACTORS: Age, Time since onset of injury/illness/exacerbation, and 3+ comorbidities: AS, A-fib, Neuropathy are also affecting patient's functional outcome.   REHAB POTENTIAL: Excellent  CLINICAL DECISION MAKING: Evolving/moderate complexity  EVALUATION COMPLEXITY: Moderate   GOALS: Goals reviewed with patient? Yes  SHORT TERM GOALS: Target date: 04/07/2024   Patient will be able to show independence for initial HEP to include posture, core and hip strength and stability.    Baseline: Goal status:MET   2.  Patient will be complete functional mobility testing/balance and goal set  Baseline:  Goal status:MET   3.  Pt will modify workout schedule to a strength focus Baseline:  Goal status:MET    LONG TERM GOALS: Target date: 05/05/2024  Patient will be independent with final HEP upon discharge from PT and report consistent benefit following exercise completion.    Baseline:  Goal status: ongoing   2.  Pt will be able to demonstrate good standing balance as evidenced by balance recovery from min to mod dynamic balance challenges.   Baseline:  Goal status: ongoing   3.  Patient will be able to demonstrate hip/knee/ankle strength to 5/5 in order to maximize functional mobility, ambulation and lifting.   Baseline:  Goal status:ongoing   4.  Patient will be able to perform sit to stand x 10 in 30 sec Baseline: 18 sec x 5 , 16 reps in 30 sec  Goal status: MET   5.  Patient will feel more confident and stable in his balance when at church.   Baseline: reports was bad in 2020 but since he is working on it, this has been maintained currently.  Less balance confidence when dynamic.  Goal status: ongoing   6.  Pt will be able to stand for 30 min without increased LLE pain.  Baseline: does standing inc LE pain  Goal status: ongoing    PLAN:  PT FREQUENCY: 1-2x/week  PT DURATION: 8 weeks   PLANNED INTERVENTIONS: 97164- PT Re-evaluation, 97750- Physical Performance Testing, 97110-Therapeutic exercises, 97530- Therapeutic activity, [id number]- Neuromuscular re-education, 97535- Self Care, [id number]- Manual therapy, [id number]- Gait training, [phone]- Aquatic Therapy, [phone] (1-2 muscles), [id number] (3+ muscles)- Dry Needling, Patient/Family education, Balance training, Taping, Spinal mobilization, Cryotherapy, and Moist heat  PLAN FOR NEXT SESSION:   update on LLE/knee. L balance test strength in LE, hips and core , posture   Bana Borgmeyer, PT 05/22/2024, 7:14 PM    Delon Norma, PT 05/22/24 7:14 PM Phone: [phone] Fax: [phone]

## 2024-05-23 ENCOUNTER — Ambulatory Visit (INDEPENDENT_AMBULATORY_CARE_PROVIDER_SITE_OTHER): Admitting: Physical Therapy

## 2024-05-23 ENCOUNTER — Encounter: Payer: Self-pay | Admitting: Physical Therapy

## 2024-05-23 DIAGNOSIS — R293 Abnormal posture: Secondary | ICD-10-CM

## 2024-05-23 DIAGNOSIS — R2689 Other abnormalities of gait and mobility: Secondary | ICD-10-CM | POA: Diagnosis not present

## 2024-05-23 DIAGNOSIS — M6281 Muscle weakness (generalized): Secondary | ICD-10-CM | POA: Diagnosis not present

## 2024-05-23 DIAGNOSIS — M79605 Pain in left leg: Secondary | ICD-10-CM

## 2024-05-25 ENCOUNTER — Encounter: Payer: Self-pay | Admitting: Physical Therapy

## 2024-05-25 ENCOUNTER — Ambulatory Visit: Admitting: Physical Therapy

## 2024-05-25 DIAGNOSIS — R2689 Other abnormalities of gait and mobility: Secondary | ICD-10-CM | POA: Diagnosis not present

## 2024-05-25 DIAGNOSIS — R293 Abnormal posture: Secondary | ICD-10-CM

## 2024-05-25 DIAGNOSIS — M6281 Muscle weakness (generalized): Secondary | ICD-10-CM

## 2024-05-25 DIAGNOSIS — M79605 Pain in left leg: Secondary | ICD-10-CM

## 2024-05-25 NOTE — Therapy (Signed)
 OUTPATIENT PHYSICAL THERAPY LOWER EXTREMITY   Patient Name: Wesley [name]. MRN: [id number] DOB:05-21-1941, 83 y.o., male Today's Date: 05/25/2024   END OF SESSION:  PT End of Session - 05/25/24 2132     Visit Number 14    Number of Visits 20    Date for Recertification  06/29/24    Authorization Type Medicare, [employer]    PT Start Time 1217    PT Stop Time 1300    PT Time Calculation (min) 43 min    Activity Tolerance Patient tolerated treatment well    Behavior During Therapy WFL for tasks assessed/performed                  Past Medical History:  Diagnosis Date   A-fib (HCC)    cardioversion x2   Ankylosing spondylitis (HCC)    Atrial flutter (HCC)    GERD (gastroesophageal reflux disease)    Hemorrhoids    Irregular heart beat    MVP (mitral valve prolapse)    WITH A MIDSYSTOLIC CLICK   OSA (obstructive sleep apnea)    uses CPAP nightly   Peripheral neuropathy    Vitamin B12 deficiency    Past Surgical History:  Procedure Laterality Date   ABLATION     CARDIOVERSION N/A 12/20/2019   Procedure: CARDIOVERSION;  Surgeon: [name], MD;  Location: [facility];  Service: Cardiovascular;  Laterality: N/A;   COLONOSCOPY     INGUINAL HERNIA REPAIR     LESION REMOVAL N/A 05/22/2022   Procedure: EXCISION UPPER BACK SEBACEOUS CYST;  Surgeon: [name], MD;  Location: [facility];  Service: General;  Laterality: N/A;  LOCAL ANESTHESIA   venous ligation     for varices left lower leg   Patient Active Problem List   Diagnosis Date Noted   Sciatica of left side associated with disorder of lumbar spine 02/22/2024   Abnormality of gait 06/26/2021   Chronic hoarseness 10/04/2020   PAF (paroxysmal atrial fibrillation) (HCC)    DDD (degenerative disc disease), lumbar 10/28/2017   Bilateral bunions 08/20/2016   Varicose veins of right lower extremity with complications 01/07/2016   Bruit 10/10/2015   Varicose veins of leg with complications  10/01/2015   Ankylosing spondylitis (HCC) 01/14/2015   Chronic anticoagulation 01/14/2015   Sleep apnea 01/14/2015   S/P ablation of atrial fibrillation 01/08/2015   Persistent atrial fibrillation (HCC) 01/08/2015   Orthostatic dizziness 12/01/2013   Calf pain 11/22/2012   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/22/2012   Peripheral neuropathy 10/21/2012   Pain in joint, ankle and foot 09/21/2012   Disturbance of skin sensation 09/21/2012   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 09/21/2012   Other general symptoms(780.99) 09/21/2012   Heel pain 06/07/2012   Plantar fasciitis 06/07/2012   Mitral valve prolapse 06/03/2011   Pain in joint of right shoulder 04/03/2011   Rotator cuff syndrome of right shoulder 04/03/2011   Paroxysmal atrial fibrillation (HCC) 01/20/2011   Hypercholesterolemia 01/20/2011   OBSTRUCTIVE SLEEP APNEA 10/08/2009    PCP: [name] MD   REFERRING PROVIDER: [name] MD/ [name] MD   REFERRING DIAG: [phone] (ICD-10-CM) - Left sided sciatica  THERAPY DIAG:  Muscle weakness (generalized)  Other abnormalities of gait and mobility  Abnormal posture  Pain in left leg  Rationale for Evaluation and Treatment: Rehabilitation  ONSET DATE: acute on chronic   SUBJECTIVE:   SUBJECTIVE STATEMENT: Patient states minimal changes in LE pain.    PERTINENT HISTORY: Dr. Harvey note  had sent him to neuro PT because of what I saw is a high fall risk No recent falls but he feels unsteady at times and even has had some retropulsion Today he has dynamic genu valgus on the right with hip abduction weakness I gave him a hip abduction series but want him to pursue this at physical therapy until we can get improved right hip strength  Patient saw Dr. Teressa 2 weeks ago for worsening sciatica that left leg.  He has a progressive peripheral neuropathy.  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  Is been thought that his  radicular symptoms probably originate in his lumbar spine.   PAIN:  Are you having pain? Yes: NPRS scale: 5/10    Pain location: L post thigh  Pain description: aching  Aggravating factors: sitting with knee flexed , overstretch  Relieving factors: gabapentin , stretching , moving , figure 4 stretch    PRECAUTIONS: None monitor balance  RED FLAGS: None   WEIGHT BEARING RESTRICTIONS: No  FALLS:  Has patient fallen in last 6 months? No but does note worsening balance lately.   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  PATIENT GOALS: To be rid of the pain   NEXT MD VISIT: as needed   OBJECTIVE:   Note: Objective measures were completed at Evaluation unless otherwise noted.  DIAGNOSTIC FINDINGS: none recent   PATIENT SURVEYS: NT     COGNITION: Overall cognitive status: Within functional limits for tasks assessed     SENSATION: Major numbness both legs, bottom 1/3 of my legs and feet  EDEMA:  None   MUSCLE LENGTH: Hamstrings: lacks about 10 deg each leg.  Rt LE SLR 55 and Lt. 45 deg  Thomas test: NT   POSTURE: rounded shoulders, forward head, increased thoracic kyphosis, posterior pelvic tilt, and flexed trunk   PALPATION: Pain to palpation of left piriformis but only minimal to moderate No pain in the left hamstring or calf  LOWER EXTREMITY ROM: Within functional limits Patient notices a greater sense of tightness in the left hamstring with passive stretching  Active ROM Right eval Left eval  Hip flexion    Hip extension    Hip abduction    Hip adduction    Hip internal rotation    Hip external rotation    Knee flexion    Knee extension    Ankle dorsiflexion    Ankle plantarflexion     Ankle inversion    Ankle eversion     (Blank rows = not tested)  LOWER EXTREMITY MMT:  MMT Right eval Left eval Rt./Lt.  05/04/24  Hip flexion 5 5   Hip extension 4 4 4+/5, 4+/5  Hip abduction 3+ 4 3+/5, 4/5  Hip adduction     Hip internal rotation     Hip external rotation     Knee flexion 5 4+ 5/5, 4+/5  Knee extension 5 4+ 5/5, 4+/5  Ankle dorsiflexion     Ankle plantarflexion     Ankle inversion     Ankle eversion      (Blank rows = not tested)  LOWER EXTREMITY SPECIAL TESTS:  NT   FUNCTIONAL TESTS:  5 times sit  to stand: 17 sec  04/21/24: 5 X STS 12 sec  Needs UE assit to balance on one leg , hip compensation, trunk lean   GAIT: Distance walked: 150 Assistive device utilized: None Level of assistance: Modified independence Comments: Trunk flexed upper back rounded and shoulders forward.  Apparent decrease in proprioception in the lower body with decreased hip stability lack of arm swing and right foot slight lead turn and word                                                                                                                                TREATMENT DATE:   [facility] Adult PT Treatment:                                                DATE: 05/25/24 Therapeutic Exercise: LTR x 15  Bridging 2 x 10  Standing hip abd 2 x 10 bil;  Fig 4 piriformis after needling.  Manual Therapy: LAD bil lumbar  Manual hamstring stretch with ankle pump for nerve glide.  Self Care:  Trigger Point Dry Needling  Initial Treatment: Pt instructed on Dry Needling rational, procedures, and possible side effects. Pt instructed to expect mild to moderate muscle soreness later in the day and/or into the next day.  Pt instructed in methods to reduce muscle soreness. Pt instructed to continue prescribed HEP. Because Dry Needling was performed over or adjacent to a lung field, pt was educated on S/S of pneumothorax and to seek immediate medical attention should they occur.   Patient was educated on signs and symptoms of infection and other risk factors and advised to seek medical attention should they occur.  Patient verbalized understanding of these instructions and education.   Patient Verbal Consent Given: Yes Education Handout Provided: Yes Muscles Treated: glute med/min , piriformis  Electrical Stimulation Performed: No Treatment Response/Outcome: twitch response, palpable increase in muscle length         [facility] Adult PT Treatment:                                                DATE: 05/23/24 Therapeutic Exercise: Seated figure 4  Hamstring stretch with strap seated  Prone quad stretch  Knee to chest  Nerve flossing  Manual Therapy: Prone hip compression and soft tissue mobilization  Hip PROM ER/IR  Quad stretch  Self Care: Sciatica vs lumbar Trigger point therapy to piriformis    [facility] Adult PT Treatment:  DATE: 05/04/24 Therapeutic Exercise: Supine knee to chest  Supported knee extension  Bridging Sit to stand Manual Therapy: P prone soft tissue work to the left hamstring gastrocsoleus.  Pain mostly in the medial hamstring  Pin and stretch left hamstring  Self Care: Differential diagnosis Options for stretching the left leg gently without over doing it  [facility] Adult PT Treatment:                                                DATE: 04/27/24 Therapeutic Exercise: Calf Raise in standing, multiple positions- stronger in LLE  Seated calf raise 25 lbs x 15  Supine hamstring/ITB x 2 each  Supine 10 lbs bridge x 10 Single leg 10 lbs x 10 in fig 4  Therapeutic Activity: Obstacles step over facing forward and then laterall, min A  Lateral step ups  Hip abduction x 15  Plank modified at high table    [facility] Adult PT Treatment:                                                DATE: 04/21/24 Therapeutic Exercise: TM 2.0 mph warm up focus on heel strike and upright posture, 6 min   Neuromuscular  re-ed: Pilates Tower for LE/Core strength, postural strength, lumbopelvic disassociation and core control.  Exercises included: Supine Leg [place]  Single leg arcs Double leg arcs and circles  Sidelying Leg Springs Hip add/abd Veterinary Surgeon  Arm [employer] with arcs  90/90 arcs       PATIENT EDUCATION:  Education details: See above self-care Person educated: Patient Education method: Explanation, Demonstration, Tactile cues, Verbal cues, and Handouts Education comprehension: verbalized understanding and needs further education  HOME EXERCISE PROGRAM: Access Code: [email or web address] URL: [email or web address] Date: 03/14/2024 Prepared by: Delon Norma  Exercises - Sidelying Hip Abduction  - 1 x daily - 7 x weekly - 3 sets - 10 reps - Sidestepping  - 1 x daily - 7 x weekly - 3 sets - 10 reps - Single Leg Stance with Support  - 1 x daily - 7 x weekly - 1 sets - 3-5 reps - 30 hold - Standing Hip Abduction with Resistance at Ankles and Counter Support  - 1 x daily - 7 x weekly - 2 sets - 10 reps - 5 hold - Hip Extension with Resistance Loop  - 1 x daily - 7 x weekly - 2 sets - 10 reps - 5 hold - calf raise    ASSESSMENT:  CLINICAL IMPRESSION: Pt with good tolerance for dry needling today. Will continue to assess effect of muscle tension and pain relief next visit. He continues to have weakness and symptoms in LE. Will benefit from continued strength of L LE as able. Plan to continue PT to address functional deficits mentioned including lower extremity pain hip weakness gait instability and postural deficits.    Last session: Session emphasized balance and hip strength today, reviewed home routine and offer corrections to include increasing the time spent with his head turn in order to fully integrate the balance challenge.  He was able to eventually stand on 1 leg for about 10 seconds but the left side is markedly less stable.  Patient notes  neuropathy may  be getting worse and is definitely impacting his mobility.  He needs to visually see each step which lends itself to a bent over posture with gait.  He has decreased heel strike and walks with a heavy step.  We practiced walking with a cane and it did improve his stability.  He may be more open to consider a walking pole to improve his upright posture as well as using it for a balance tool at home.  Patient will continue to benefit from skilled PT in order to return to PLOF and optimize functional mobility.         OBJECTIVE IMPAIRMENTS: Abnormal gait, decreased balance, decreased coordination, decreased knowledge of condition, decreased mobility, difficulty walking, decreased strength, increased fascial restrictions, impaired flexibility, impaired UE functional use, improper body mechanics, postural dysfunction, and pain.   ACTIVITY LIMITATIONS: lifting, standing, stairs, transfers, and locomotion level  PARTICIPATION LIMITATIONS: community activity and occupation  PERSONAL FACTORS: Age, Time since onset of injury/illness/exacerbation, and 3+ comorbidities: AS, A-fib, Neuropathy are also affecting patient's functional outcome.   REHAB POTENTIAL: Excellent  CLINICAL DECISION MAKING: Evolving/moderate complexity  EVALUATION COMPLEXITY: Moderate   GOALS: Goals reviewed with patient? Yes  SHORT TERM GOALS: Target date: 04/07/2024   Patient will be able to show independence for initial HEP to include posture, core and hip strength and stability.   Baseline: Goal status:MET   2.  Patient will be complete functional mobility testing/balance and goal set  Baseline:  Goal status:MET   3.  Pt will modify workout schedule to a strength focus Baseline:  Goal status:MET    LONG TERM GOALS: Target date: 05/05/2024    Patient will be independent with final HEP upon discharge from PT and report consistent benefit following exercise completion.    Baseline:  Goal status: ongoing   2.  Pt  will be able to demonstrate good standing balance as evidenced by balance recovery from min to mod dynamic balance challenges.   Baseline:  Goal status: ongoing   3.  Patient will be able to demonstrate hip/knee/ankle strength to 5/5 in order to maximize functional mobility, ambulation and lifting.   Baseline:  Goal status:ongoing   4.  Patient will be able to perform sit to stand x 10 in 30 sec Baseline: 18 sec x 5 , 16 reps in 30 sec  Goal status: MET   5.  Patient will feel more confident and stable in his balance when at church.   Baseline: reports was bad in 2020 but since he is working on it, this has been maintained currently.  Less balance confidence when dynamic.  Goal status: ongoing   6.  Pt will be able to stand for 30 min without increased LLE pain.  Baseline: does standing inc LE pain  Goal status: ongoing    PLAN:  PT FREQUENCY: 1-2x/week  PT DURATION: 8 weeks   PLANNED INTERVENTIONS: 97164- PT Re-evaluation, 97750- Physical Performance Testing, 97110-Therapeutic exercises, 97530- Therapeutic activity, [id number]- Neuromuscular re-education, 97535- Self Care, [id number]- Manual therapy, [id number]- Gait training, [phone]- Aquatic Therapy, [phone] (1-2 muscles), [id number] (3+ muscles)- Dry Needling, Patient/Family education, Balance training, Taping, Spinal mobilization, Cryotherapy, and Moist heat  PLAN FOR NEXT SESSION:   needling L piriformis.  L balance test strength in LE, hips and core , posture Continue strength for L LE.   Tinnie Don, PT, DPT 9:32 PM  05/25/24

## 2024-05-26 ENCOUNTER — Encounter: Admitting: Physical Therapy

## 2024-06-01 DIAGNOSIS — Z7901 Long term (current) use of anticoagulants: Secondary | ICD-10-CM | POA: Diagnosis not present

## 2024-06-01 DIAGNOSIS — E785 Hyperlipidemia, unspecified: Secondary | ICD-10-CM | POA: Diagnosis not present

## 2024-06-01 DIAGNOSIS — N401 Enlarged prostate with lower urinary tract symptoms: Secondary | ICD-10-CM | POA: Diagnosis not present

## 2024-06-02 ENCOUNTER — Encounter: Admitting: Physical Therapy

## 2024-06-07 ENCOUNTER — Other Ambulatory Visit: Payer: Self-pay | Admitting: *Deleted

## 2024-06-07 DIAGNOSIS — I480 Paroxysmal atrial fibrillation: Secondary | ICD-10-CM

## 2024-06-07 MED ORDER — APIXABAN 5 MG PO TABS: 5.0000 mg | ORAL_TABLET | Freq: Two times a day (BID) | ORAL | 1 refills | Status: DC

## 2024-06-07 NOTE — Telephone Encounter (Signed)
 Eliquis  5mg  refill request received. Patient is 82 years old, weight-94.3kg, Crea-0.70 on 06/01/24, Diagnosis-Afib, and last seen by Lum Louis on 02/16/24. Dose is appropriate based on dosing criteria. Will send in refill to requested pharmacy.

## 2024-06-08 DIAGNOSIS — Z0000 Encounter for general adult medical examination without abnormal findings: Secondary | ICD-10-CM | POA: Diagnosis not present

## 2024-06-08 DIAGNOSIS — Z1389 Encounter for screening for other disorder: Secondary | ICD-10-CM | POA: Diagnosis not present

## 2024-06-08 DIAGNOSIS — R2689 Other abnormalities of gait and mobility: Secondary | ICD-10-CM | POA: Diagnosis not present

## 2024-06-08 DIAGNOSIS — I480 Paroxysmal atrial fibrillation: Secondary | ICD-10-CM | POA: Diagnosis not present

## 2024-06-08 DIAGNOSIS — M459 Ankylosing spondylitis of unspecified sites in spine: Secondary | ICD-10-CM | POA: Diagnosis not present

## 2024-06-08 DIAGNOSIS — R6889 Other general symptoms and signs: Secondary | ICD-10-CM | POA: Diagnosis not present

## 2024-06-08 DIAGNOSIS — E785 Hyperlipidemia, unspecified: Secondary | ICD-10-CM | POA: Diagnosis not present

## 2024-06-08 DIAGNOSIS — G4733 Obstructive sleep apnea (adult) (pediatric): Secondary | ICD-10-CM | POA: Diagnosis not present

## 2024-06-08 DIAGNOSIS — R42 Dizziness and giddiness: Secondary | ICD-10-CM | POA: Diagnosis not present

## 2024-06-08 DIAGNOSIS — N401 Enlarged prostate with lower urinary tract symptoms: Secondary | ICD-10-CM | POA: Diagnosis not present

## 2024-06-08 DIAGNOSIS — M1990 Unspecified osteoarthritis, unspecified site: Secondary | ICD-10-CM | POA: Diagnosis not present

## 2024-06-08 DIAGNOSIS — G629 Polyneuropathy, unspecified: Secondary | ICD-10-CM | POA: Diagnosis not present

## 2024-06-08 DIAGNOSIS — R82998 Other abnormal findings in urine: Secondary | ICD-10-CM | POA: Diagnosis not present

## 2024-06-08 DIAGNOSIS — I341 Nonrheumatic mitral (valve) prolapse: Secondary | ICD-10-CM | POA: Diagnosis not present

## 2024-06-08 DIAGNOSIS — Z1331 Encounter for screening for depression: Secondary | ICD-10-CM | POA: Diagnosis not present

## 2024-06-09 ENCOUNTER — Encounter: Payer: Self-pay | Admitting: Physical Therapy

## 2024-06-09 ENCOUNTER — Ambulatory Visit: Admitting: Physical Therapy

## 2024-06-09 DIAGNOSIS — M6281 Muscle weakness (generalized): Secondary | ICD-10-CM | POA: Diagnosis not present

## 2024-06-09 DIAGNOSIS — R2689 Other abnormalities of gait and mobility: Secondary | ICD-10-CM | POA: Diagnosis not present

## 2024-06-09 NOTE — Therapy (Signed)
 OUTPATIENT PHYSICAL THERAPY LOWER EXTREMITY   Patient Name: Wesley [name]. MRN: [id number] DOB:12-11-1940, 83 y.o., male Today's Date: 06/09/2024   END OF SESSION:  PT End of Session - 06/09/24 1101     Visit Number 15    Number of Visits 20    Date for Recertification  06/29/24    Authorization Type Medicare, T[employer]    PT Start Time 1104    PT Stop Time 1145    PT Time Calculation (min) 41 min    Activity Tolerance Patient tolerated treatment well    Behavior During Therapy WFL for tasks assessed/performed                  Past Medical History:  Diagnosis Date   A-fib (HCC)    cardioversion x2   Ankylosing spondylitis (HCC)    Atrial flutter (HCC)    GERD (gastroesophageal reflux disease)    Hemorrhoids    Irregular heart beat    MVP (mitral valve prolapse)    WITH A MIDSYSTOLIC CLICK   OSA (obstructive sleep apnea)    uses CPAP nightly   Peripheral neuropathy    Vitamin B12 deficiency    Past Surgical History:  Procedure Laterality Date   ABLATION     CARDIOVERSION N/A 12/20/2019   Procedure: CARDIOVERSION;  Surgeon: [name], MD;  Location: [facility];  Service: Cardiovascular;  Laterality: N/A;   COLONOSCOPY     INGUINAL HERNIA REPAIR     LESION REMOVAL N/A 05/22/2022   Procedure: EXCISION UPPER BACK SEBACEOUS CYST;  Surgeon: [name], MD;  Location: [facility];  Service: General;  Laterality: N/A;  LOCAL ANESTHESIA   venous ligation     for varices left lower leg   Patient Active Problem List   Diagnosis Date Noted   Sciatica of left side associated with disorder of lumbar spine 02/22/2024   Abnormality of gait 06/26/2021   Chronic hoarseness 10/04/2020   PAF (paroxysmal atrial fibrillation) (HCC)    DDD (degenerative disc disease), lumbar 10/28/2017   Bilateral bunions 08/20/2016   Varicose veins of right lower extremity with complications 01/07/2016   Bruit 10/10/2015   Varicose veins of leg with complications  10/01/2015   Ankylosing spondylitis (HCC) 01/14/2015   Chronic anticoagulation 01/14/2015   Sleep apnea 01/14/2015   S/P ablation of atrial fibrillation 01/08/2015   Persistent atrial fibrillation (HCC) 01/08/2015   Orthostatic dizziness 12/01/2013   Calf pain 11/22/2012   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/22/2012   Peripheral neuropathy 10/21/2012   Pain in joint, ankle and foot 09/21/2012   Disturbance of skin sensation 09/21/2012   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 09/21/2012   Other general symptoms(780.99) 09/21/2012   Heel pain 06/07/2012   Plantar fasciitis 06/07/2012   Mitral valve prolapse 06/03/2011   Pain in joint of right shoulder 04/03/2011   Rotator cuff syndrome of right shoulder 04/03/2011   Paroxysmal atrial fibrillation (HCC) 01/20/2011   Hypercholesterolemia 01/20/2011   OBSTRUCTIVE SLEEP APNEA 10/08/2009    PCP: [name] MD   REFERRING PROVIDER: [name] MD/ [name] MD   REFERRING DIAG: [phone] (ICD-10-CM) - Left sided sciatica  THERAPY DIAG:  Muscle weakness (generalized)  Other abnormalities of gait and mobility  Rationale for Evaluation and Treatment: Rehabilitation  ONSET DATE: acute on chronic   SUBJECTIVE:   SUBJECTIVE STATEMENT: Patient states minimal changes in LE pain. Still feeling that legs are very weak, R >L. He has f/u MD appt next week.  PERTINENT HISTORY: Dr. Harvey note  had sent him to neuro PT because of what I saw is a high fall risk No recent falls but he feels unsteady at times and even has had some retropulsion Today he has dynamic genu valgus on the right with hip abduction weakness I gave him a hip abduction series but want him to pursue this at physical therapy until we can get improved right hip strength  Patient saw Dr. Teressa 2 weeks ago for worsening sciatica that left leg.  He has a progressive peripheral neuropathy.  He has a diagnosis that started when he was young of ankylosing  spondylitis.  Is been thought that his radicular symptoms probably originate in his lumbar spine.   PAIN:  Are you having pain? Yes: NPRS scale: 5/10    Pain location: L post thigh  Pain description: aching  Aggravating factors: sitting with knee flexed , overstretch  Relieving factors: gabapentin , stretching , moving , figure 4 stretch    PRECAUTIONS: None monitor balance  RED FLAGS: None   WEIGHT BEARING RESTRICTIONS: No  FALLS:  Has patient fallen in last 6 months? No but does note worsening balance lately.   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  PATIENT GOALS: To be rid of the pain   NEXT MD VISIT: as needed   OBJECTIVE:   Note: Objective measures were completed at Evaluation unless otherwise noted.  DIAGNOSTIC FINDINGS: none recent   PATIENT SURVEYS: NT     COGNITION: Overall cognitive status: Within functional limits for tasks assessed     SENSATION: Major numbness both legs, bottom 1/3 of my legs and feet  EDEMA:  None   MUSCLE LENGTH: Hamstrings: lacks about 10 deg each leg.  Rt LE SLR 55 and Lt. 45 deg  Thomas test: NT   POSTURE: rounded shoulders, forward head, increased thoracic kyphosis, posterior pelvic tilt, and flexed trunk   PALPATION: Pain to palpation of left piriformis but only minimal to moderate No pain in the left hamstring or calf  LOWER EXTREMITY ROM: Within functional limits Patient notices a greater sense of tightness in the left hamstring with passive stretching  Active ROM Right eval Left eval  Hip flexion    Hip extension    Hip abduction    Hip adduction    Hip internal rotation    Hip external rotation    Knee flexion    Knee extension    Ankle  dorsiflexion    Ankle plantarflexion    Ankle inversion    Ankle eversion     (Blank rows = not tested)  LOWER EXTREMITY MMT:  MMT Right eval Left eval Rt./Lt.  05/04/24  Hip flexion 5 5   Hip extension 4 4 4+/5, 4+/5  Hip abduction 3+ 4 3+/5, 4/5  Hip adduction     Hip internal rotation     Hip external rotation     Knee flexion 5 4+ 5/5, 4+/5  Knee extension 5 4+ 5/5, 4+/5  Ankle dorsiflexion     Ankle plantarflexion     Ankle inversion     Ankle eversion      (Blank rows = not tested)  LOWER EXTREMITY SPECIAL TESTS:  NT  FUNCTIONAL TESTS:  5 times sit to stand: 17 sec  04/21/24: 5 X STS 12 sec  Needs UE assit to balance on one leg , hip compensation, trunk lean   GAIT: Distance walked: 150 Assistive device utilized: None Level of assistance: Modified independence Comments: Trunk flexed upper back rounded and shoulders forward.  Apparent decrease in proprioception in the lower body with decreased hip stability lack of arm swing and right foot slight lead turn and word                                                                                                                                  TREATMENT DATE:   [facility] Adult PT Treatment:                                                DATE: 06/09/24 Therapeutic Exercise: LTR x 15  Seated HR 2 x 10 review for HEP  Standing HR x10  -discussed seated vs standing positions-  Standing hip abd 2 x 10 bil;  Reviewed bridge for HEP Manual Therapy: LAD L lumbar  Self Care: Therapeutic Activity: Fwd step ups 6 in x 10 bil;  Lateral step ups 6 in x 10 bil;  Standing hip abd 2 x 10 bil;  Trigger Point Dry Needling  Initial Treatment: Pt instructed on Dry Needling rational, procedures, and possible side effects. Pt instructed to expect mild to moderate muscle soreness later in the day and/or into the next day.  Pt instructed in methods to reduce muscle soreness. Pt instructed to continue prescribed HEP. Because Dry  Needling was performed over or adjacent to a lung field, pt was educated on S/S of pneumothorax and to seek immediate medical attention should they occur.  Patient was educated on signs and symptoms of infection and other risk factors and advised to seek medical attention should they occur.  Patient verbalized understanding of these instructions and education.   Patient Verbal Consent Given: Yes Education Handout Provided: Yes Muscles Treated: glute med/min , piriformis  Electrical Stimulation Performed: No Treatment Response/Outcome: twitch response, palpable increase in muscle length      [facility] Adult PT Treatment:                                                DATE: 05/25/24 Therapeutic Exercise: LTR x 15  Bridging 2 x 10  Standing hip abd 2 x 10 bil;  Fig 4 piriformis after needling.  Manual Therapy: LAD bil lumbar  Manual hamstring stretch with ankle pump for nerve glide.  Self Care:  Trigger Point Dry Needling  Initial Treatment: Pt instructed on Dry Needling rational, procedures, and possible  side effects. Pt instructed to expect mild to moderate muscle soreness later in the day and/or into the next day.  Pt instructed in methods to reduce muscle soreness. Pt instructed to continue prescribed HEP. Because Dry Needling was performed over or adjacent to a lung field, pt was educated on S/S of pneumothorax and to seek immediate medical attention should they occur.  Patient was educated on signs and symptoms of infection and other risk factors and advised to seek medical attention should they occur.  Patient verbalized understanding of these instructions and education.   Patient Verbal Consent Given: Yes Education Handout Provided: Yes Muscles Treated: glute med/min , piriformis  Electrical Stimulation Performed: No Treatment Response/Outcome: twitch response, palpable increase in muscle length     [facility] Adult PT Treatment:                                                 DATE: 05/23/24 Therapeutic Exercise: Seated figure 4  Hamstring stretch with strap seated  Prone quad stretch  Knee to chest  Nerve flossing  Manual Therapy: Prone hip compression and soft tissue mobilization  Hip PROM ER/IR  Quad stretch  Self Care: Sciatica vs lumbar Trigger point therapy to piriformis    [facility] Adult PT Treatment:                                                DATE: 05/04/24 Therapeutic Exercise: Supine knee to chest  Supported knee extension  Bridging Sit to stand Manual Therapy: P prone soft tissue work to the left hamstring gastrocsoleus.  Pain mostly in the medial hamstring  Pin and stretch left hamstring  Self Care: Differential diagnosis Options for stretching the left leg gently without over doing it  [facility] Adult PT Treatment:                                                DATE: 04/27/24 Therapeutic Exercise: Calf Raise in standing, multiple positions- stronger in LLE  Seated calf raise 25 lbs x 15  Supine hamstring/ITB x 2 each  Supine 10 lbs bridge x 10 Single leg 10 lbs x 10 in fig 4  Therapeutic Activity: Obstacles step over facing forward and then laterall, min A  Lateral step ups  Hip abduction x 15  Plank modified at high table     PATIENT EDUCATION:  Education details: See above self-care Person educated: Patient Education method: Explanation, Demonstration, Tactile cues, Verbal cues, and Handouts Education comprehension: verbalized understanding and needs further education  HOME EXERCISE PROGRAM: Access Code: [email or web address] URL: [email or web address] Date: 03/14/2024 Prepared by: Delon Norma  Exercises - Sidelying Hip Abduction  - 1 x daily - 7 x weekly - 3 sets - 10 reps - Sidestepping  - 1 x daily - 7 x weekly - 3 sets - 10 reps - Single Leg Stance with Support  - 1 x daily - 7 x weekly - 1 sets - 3-5 reps - 30 hold - Standing Hip Abduction with Resistance at Ankles and Counter Support  - 1 x daily - 7 x weekly -  2  sets - 10 reps - 5 hold - Hip Extension with Resistance Loop  - 1 x daily - 7 x weekly - 2 sets - 10 reps - 5 hold - calf raise    ASSESSMENT:  CLINICAL IMPRESSION: Pt continues to have pain in L lower leg as well as weakness in both legs that are limiting mobility. He is challenged with strengthening exercises today. Reviewed doing Heel raises in sitting for more optimal form and contraction, and reviewed need for continued strengthening. Continued dry needling for L glute. Pt with improved pain symptoms at end of session today, but will continue to assess carryover. Pt to benefit from continued care. He will have f/u with MD next week.    Last session: Session emphasized balance and hip strength today, reviewed home routine and offer corrections to include increasing the time spent with his head turn in order to fully integrate the balance challenge.  He was able to eventually stand on 1 leg for about 10 seconds but the left side is markedly less stable.  Patient notes neuropathy may be getting worse and is definitely impacting his mobility.  He needs to visually see each step which lends itself to a bent over posture with gait.  He has decreased heel strike and walks with a heavy step.  We practiced walking with a cane and it did improve his stability.  He may be more open to consider a walking pole to improve his upright posture as well as using it for a balance tool at home.  Patient will continue to benefit from skilled PT in order to return to PLOF and optimize functional mobility.         OBJECTIVE IMPAIRMENTS: Abnormal gait, decreased balance, decreased coordination, decreased knowledge of condition, decreased mobility, difficulty walking, decreased strength, increased fascial restrictions, impaired flexibility, impaired UE functional use, improper body mechanics, postural dysfunction, and pain.   ACTIVITY LIMITATIONS: lifting, standing, stairs, transfers, and locomotion  level  PARTICIPATION LIMITATIONS: community activity and occupation  PERSONAL FACTORS: Age, Time since onset of injury/illness/exacerbation, and 3+ comorbidities: AS, A-fib, Neuropathy are also affecting patient's functional outcome.   REHAB POTENTIAL: Excellent  CLINICAL DECISION MAKING: Evolving/moderate complexity  EVALUATION COMPLEXITY: Moderate   GOALS: Goals reviewed with patient? Yes  SHORT TERM GOALS: Target date: 04/07/2024   Patient will be able to show independence for initial HEP to include posture, core and hip strength and stability.   Baseline: Goal status:MET   2.  Patient will be complete functional mobility testing/balance and goal set  Baseline:  Goal status:MET   3.  Pt will modify workout schedule to a strength focus Baseline:  Goal status:MET    LONG TERM GOALS: Target date: 05/05/2024    Patient will be independent with final HEP upon discharge from PT and report consistent benefit following exercise completion.    Baseline:  Goal status: ongoing   2.  Pt will be able to demonstrate good standing balance as evidenced by balance recovery from min to mod dynamic balance challenges.   Baseline:  Goal status: ongoing   3.  Patient will be able to demonstrate hip/knee/ankle strength to 5/5 in order to maximize functional mobility, ambulation and lifting.   Baseline:  Goal status:ongoing   4.  Patient will be able to perform sit to stand x 10 in 30 sec Baseline: 18 sec x 5 , 16 reps in 30 sec  Goal status: MET   5.  Patient will feel more confident  and stable in his balance when at church.   Baseline: reports was bad in 2020 but since he is working on it, this has been maintained currently.  Less balance confidence when dynamic.  Goal status: ongoing   6.  Pt will be able to stand for 30 min without increased LLE pain.  Baseline: does standing inc LE pain  Goal status: ongoing    PLAN:  PT FREQUENCY: 1-2x/week  PT DURATION: 8 weeks    PLANNED INTERVENTIONS: 97164- PT Re-evaluation, 97750- Physical Performance Testing, 97110-Therapeutic exercises, 97530- Therapeutic activity, [id number]- Neuromuscular re-education, 97535- Self Care, [id number]- Manual therapy, [id number]- Gait training, [phone]- Aquatic Therapy, [phone] (1-2 muscles), 20561 (3+ muscles)- Dry Needling, Patient/Family education, Balance training, Taping, Spinal mobilization, Cryotherapy, and Moist heat  PLAN FOR NEXT SESSION:   needling L piriformis.  L balance test strength in LE, hips and core , posture Continue strength for L LE.   Tinnie Don, PT, DPT 1:53 PM  06/09/24

## 2024-06-13 ENCOUNTER — Ambulatory Visit: Admitting: Sports Medicine

## 2024-06-15 ENCOUNTER — Ambulatory Visit: Admitting: Sports Medicine

## 2024-06-15 VITALS — BP 127/69 | Ht 75.0 in | Wt 208.0 lb

## 2024-06-15 DIAGNOSIS — G609 Hereditary and idiopathic neuropathy, unspecified: Secondary | ICD-10-CM | POA: Diagnosis not present

## 2024-06-15 DIAGNOSIS — M5386 Other specified dorsopathies, lumbar region: Secondary | ICD-10-CM

## 2024-06-15 DIAGNOSIS — I83891 Varicose veins of right lower extremities with other complications: Secondary | ICD-10-CM

## 2024-06-15 NOTE — Progress Notes (Signed)
 Discussed the use of AI scribe software for clinical note transcription with the patient, who gave verbal consent to proceed.  History of Present Illness Wesley [name] Wesley [name]. Wesley [name] is an 83 year old male with sciatica and neuropathy who presents with worsening leg pain and swelling.  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 - Extending the leg while sitting provides partial relief - Ibuprofen used for pain management with limited efficacy - No associated low back pain Already on gabapentin  for peripheral neuropathy  Lower extremity edema - Swelling present in the right leg - Managed with compression socks and elevation at night - History of vein ligation and ablation, with previous ablation performed in 2017 - Scheduled for evaluation for laser ablation  Peripheral neuropathy - Neuropathy affects the right foot, impairing ability to feel the pedals while driving - Gabapentin  provides partial symptom relief  Functional status and self-management - Performs stretching exercises, including pelvic tilts and knee-to-chest movements, to maintain flexibility  Walks at least 20 minutes twice a day  Physical Exam MUSCULOSKELETAL:  Pleasant older male in no acute distress BP 127/69   Ht 6' 3 (1.905 m)   Wt 208 lb (94.3 kg)   BMI 26.00 kg/m    low back lacks lordosis. Slight kyphosis at T6 to T10.  ack extension limited to 10 degrees. Full lumbar flexion, can reach toes.  Right and left lateral bend good.  Pelvic rotation limited on right.  Good straight leg raise on right, no pain on left straight leg raise,  SI joint movement tighter on left.  Hip rotation good for age, right and left.  Trendelenburg gait to the right. Stride length relatively normal.   Knee flexion more than 90 degrees.  He can also  get full extension

## 2024-06-15 NOTE — Assessment & Plan Note (Signed)
 No real change in this but he gets some relief by continuing to take his gabapentin .  He is now back to 300 mg 3 times a day If he misses doses this gets worse He can take a higher dose if he feels he is getting more benefit We will recheck in a couple months

## 2024-06-15 NOTE — Assessment & Plan Note (Signed)
 I encouraged him to follow through because of the more extensive swelling in his right leg.  His previous ablations were not done under ultrasound guidance to my knowledge so I am hoping that newer technology may allow him to have more success in the right leg.  They did help his left leg.

## 2024-06-15 NOTE — Assessment & Plan Note (Signed)
 We reviewed his x-rays that he had significant changes all the way back in 2019.  We will plan to repeat his x-rays today to see if he is having progressive problems that would explain his sciatica.  There is enough degenerative disc change that I do think this is likely coming from his back.  I did not get any pain on hip rotation which makes piriformis involvement less likely  I suggested 3 distraction exercises for his lumbar spine to see if they might improve some of his symptoms.  He may want to keep up some of the Williams flexion exercises which have helped his lumbar mobility  We will also add 1 more short walk each day as walking does seem to make his sciatica somewhat less

## 2024-06-16 ENCOUNTER — Encounter: Payer: Self-pay | Admitting: Physical Therapy

## 2024-06-16 ENCOUNTER — Ambulatory Visit (INDEPENDENT_AMBULATORY_CARE_PROVIDER_SITE_OTHER): Admitting: Physical Therapy

## 2024-06-16 DIAGNOSIS — R2689 Other abnormalities of gait and mobility: Secondary | ICD-10-CM

## 2024-06-16 DIAGNOSIS — M6281 Muscle weakness (generalized): Secondary | ICD-10-CM | POA: Diagnosis not present

## 2024-06-16 NOTE — Therapy (Signed)
 OUTPATIENT PHYSICAL THERAPY LOWER EXTREMITY   Patient Name: Wesley [name]. MRN: [id number] DOB:February 18, 1941, 83 y.o., male Today's Date: 06/16/2024   END OF SESSION:  PT End of Session - 06/16/24 1333     Visit Number 16    Number of Visits 20    Date for Recertification  06/29/24    Authorization Type Medicare, [employer]    PT Start Time 1103    PT Stop Time 1145    PT Time Calculation (min) 42 min    Activity Tolerance Patient tolerated treatment well    Behavior During Therapy WFL for tasks assessed/performed                   Past Medical History:  Diagnosis Date   A-fib (HCC)    cardioversion x2   Ankylosing spondylitis (HCC)    Atrial flutter (HCC)    GERD (gastroesophageal reflux disease)    Hemorrhoids    Irregular heart beat    MVP (mitral valve prolapse)    WITH A MIDSYSTOLIC CLICK   OSA (obstructive sleep apnea)    uses CPAP nightly   Peripheral neuropathy    Vitamin B12 deficiency    Past Surgical History:  Procedure Laterality Date   ABLATION     CARDIOVERSION N/A 12/20/2019   Procedure: CARDIOVERSION;  Surgeon: [name], MD;  Location: [facility];  Service: Cardiovascular;  Laterality: N/A;   COLONOSCOPY     INGUINAL HERNIA REPAIR     LESION REMOVAL N/A 05/22/2022   Procedure: EXCISION UPPER BACK SEBACEOUS CYST;  Surgeon: [name], MD;  Location: [facility];  Service: General;  Laterality: N/A;  LOCAL ANESTHESIA   venous ligation     for varices left lower leg   Patient Active Problem List   Diagnosis Date Noted   Sciatica of left side associated with disorder of lumbar spine 02/22/2024   Abnormality of gait 06/26/2021   Chronic hoarseness 10/04/2020   PAF (paroxysmal atrial fibrillation) (HCC)    DDD (degenerative disc disease), lumbar 10/28/2017   Bilateral bunions 08/20/2016   Varicose veins of right lower extremity with complications 01/07/2016   Bruit 10/10/2015   Varicose veins of leg with  complications 10/01/2015   Ankylosing spondylitis (HCC) 01/14/2015   Chronic anticoagulation 01/14/2015   Sleep apnea 01/14/2015   S/P ablation of atrial fibrillation 01/08/2015   Persistent atrial fibrillation (HCC) 01/08/2015   Orthostatic dizziness 12/01/2013   Calf pain 11/22/2012   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/22/2012   Peripheral neuropathy 10/21/2012   Pain in joint, ankle and foot 09/21/2012   Disturbance of skin sensation 09/21/2012   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 09/21/2012   Other general symptoms(780.99) 09/21/2012   Heel pain 06/07/2012   Plantar fasciitis 06/07/2012   Mitral valve prolapse 06/03/2011   Pain in joint of right shoulder 04/03/2011   Rotator cuff syndrome of right shoulder 04/03/2011   Paroxysmal atrial fibrillation (HCC) 01/20/2011   Hypercholesterolemia 01/20/2011   OBSTRUCTIVE SLEEP APNEA 10/08/2009    PCP: [name] MD   REFERRING PROVIDER: [name] MD/ [name] MD   REFERRING DIAG: [phone] (ICD-10-CM) - Left sided sciatica  THERAPY DIAG:  Muscle weakness (generalized)  Other abnormalities of gait and mobility  Rationale for Evaluation and Treatment: Rehabilitation  ONSET DATE: acute on chronic   SUBJECTIVE:   SUBJECTIVE STATEMENT: Patient had MD f/u this week. He was given a few flexion back stretches to do. States minimal changes in LE pain.  PERTINENT HISTORY: Dr. Harvey note  had sent him to neuro PT because of what I saw is a high fall risk No recent falls but he feels unsteady at times and even has had some retropulsion Today he has dynamic genu valgus on the right with hip abduction weakness I gave him a hip abduction series but want him to pursue this at physical therapy until we can get improved right hip strength  Patient saw Dr. Teressa 2 weeks ago for worsening sciatica that left leg.  He has a progressive peripheral neuropathy.  He has a diagnosis that started when he was young of  ankylosing spondylitis.  Is been thought that his radicular symptoms probably originate in his lumbar spine.   PAIN:  Are you having pain? Yes: NPRS scale: 5/10    Pain location: L post thigh  Pain description: aching  Aggravating factors: sitting with knee flexed , overstretch  Relieving factors: gabapentin , stretching , moving , figure 4 stretch    PRECAUTIONS: None monitor balance  RED FLAGS: None   WEIGHT BEARING RESTRICTIONS: No  FALLS:  Has patient fallen in last 6 months? No but does note worsening balance lately.   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  PATIENT GOALS: To be rid of the pain   NEXT MD VISIT: as needed   OBJECTIVE:   Note: Objective measures were completed at Evaluation unless otherwise noted.  DIAGNOSTIC FINDINGS: none recent   PATIENT SURVEYS: NT     COGNITION: Overall cognitive status: Within functional limits for tasks assessed     SENSATION: Major numbness both legs, bottom 1/3 of my legs and feet  EDEMA:  None   MUSCLE LENGTH: Hamstrings: lacks about 10 deg each leg.  Rt LE SLR 55 and Lt. 45 deg  Thomas test: NT   POSTURE: rounded shoulders, forward head, increased thoracic kyphosis, posterior pelvic tilt, and flexed trunk   PALPATION: Pain to palpation of left piriformis but only minimal to moderate No pain in the left hamstring or calf  LOWER EXTREMITY ROM: Within functional limits Patient notices a greater sense of tightness in the left hamstring with passive stretching  Active ROM Right eval Left eval  Hip flexion    Hip extension    Hip abduction    Hip adduction    Hip internal rotation    Hip external rotation    Knee flexion    Knee extension     Ankle dorsiflexion    Ankle plantarflexion    Ankle inversion    Ankle eversion     (Blank rows = not tested)  LOWER EXTREMITY MMT:  MMT Right eval Left eval Rt./Lt.  05/04/24  Hip flexion 5 5   Hip extension 4 4 4+/5, 4+/5  Hip abduction 3+ 4 3+/5, 4/5  Hip adduction     Hip internal rotation     Hip external rotation     Knee flexion 5 4+ 5/5, 4+/5  Knee extension 5 4+ 5/5, 4+/5  Ankle dorsiflexion     Ankle plantarflexion     Ankle inversion     Ankle eversion      (Blank rows = not tested)  LOWER EXTREMITY SPECIAL TESTS:  NT  FUNCTIONAL TESTS:  5 times sit to stand: 17 sec  04/21/24: 5 X STS 12 sec  Needs UE assit to balance on one leg , hip compensation, trunk lean   GAIT: Distance walked: 150 Assistive device utilized: None Level of assistance: Modified independence Comments: Trunk flexed upper back rounded and shoulders forward.  Apparent decrease in proprioception in the lower body with decreased hip stability lack of arm swing and right foot slight lead turn and word                                                                                                                                 TREATMENT DATE:   [facility] Adult PT Treatment:                                                DATE: 06/16/24 Therapeutic Exercise: LTR x 15  Seated lumbar flexion stretch x 3 in chair  Seated HR  review for HEP   Manual Therapy:  Self Care: Therapeutic Activity: Standing hip abd 3 x 10 bil;  Sit to stand 10lb 2 x 8;  Plank at high mat table-elbows x 3  15 sec  Bridging 2 x 8 (cueing for form)     [facility] Adult PT Treatment:                                                DATE: 06/09/24 Therapeutic Exercise: LTR x 15  Seated HR 2 x 10 review for HEP  Standing HR x10  -discussed seated vs standing positions-  Standing hip abd 2 x 10 bil;  Reviewed bridge for HEP Manual Therapy: LAD L lumbar  Self Care: Therapeutic Activity: Fwd step ups 6 in x 10 bil;   Lateral step ups 6 in x 10 bil;  Standing hip abd 2 x 10 bil;      [facility] Adult PT Treatment:                                                DATE: 05/25/24 Therapeutic Exercise: LTR x 15  Bridging 2 x 10  Standing hip abd 2 x 10 bil;  Fig 4 piriformis after needling.  Manual Therapy: LAD bil lumbar  Manual hamstring stretch with ankle pump for nerve glide.  Self Care:  Trigger Point Dry Needling  Initial Treatment: Pt instructed on Dry Needling rational, procedures, and possible side effects. Pt instructed to expect mild to moderate muscle soreness later in the day and/or into the next day.  Pt instructed  in methods to reduce muscle soreness. Pt instructed to continue prescribed HEP. Because Dry Needling was performed over or adjacent to a lung field, pt was educated on S/S of pneumothorax and to seek immediate medical attention should they occur.  Patient was educated on signs and symptoms of infection and other risk factors and advised to seek medical attention should they occur.  Patient verbalized understanding of these instructions and education.   Patient Verbal Consent Given: Yes Education Handout Provided: Yes Muscles Treated: glute med/min , piriformis  Electrical Stimulation Performed: No Treatment Response/Outcome: twitch response, palpable increase in muscle length      PATIENT EDUCATION:  Education details: See above self-care Person educated: Patient Education method: Explanation, Demonstration, Tactile cues, Verbal cues, and Handouts Education comprehension: verbalized understanding and needs further education  HOME EXERCISE PROGRAM: Access Code: [email or web address]   ASSESSMENT:  CLINICAL IMPRESSION:  Pt continues to have pain in L lower leg as well as weakness in both legs that are limiting mobility. He is challenged with most exercises today. Next visit pt will bring his older HEP s to try and combine and update as appropriate for him at this time.    Last  session: Session emphasized balance and hip strength today, reviewed home routine and offer corrections to include increasing the time spent with his head turn in order to fully integrate the balance challenge.  He was able to eventually stand on 1 leg for about 10 seconds but the left side is markedly less stable.  Patient notes neuropathy may be getting worse and is definitely impacting his mobility.  He needs to visually see each step which lends itself to a bent over posture with gait.  He has decreased heel strike and walks with a heavy step.  We practiced walking with a cane and it did improve his stability.  He may be more open to consider a walking pole to improve his upright posture as well as using it for a balance tool at home.  Patient will continue to benefit from skilled PT in order to return to PLOF and optimize functional mobility.         OBJECTIVE IMPAIRMENTS: Abnormal gait, decreased balance, decreased coordination, decreased knowledge of condition, decreased mobility, difficulty walking, decreased strength, increased fascial restrictions, impaired flexibility, impaired UE functional use, improper body mechanics, postural dysfunction, and pain.   ACTIVITY LIMITATIONS: lifting, standing, stairs, transfers, and locomotion level  PARTICIPATION LIMITATIONS: community activity and occupation  PERSONAL FACTORS: Age, Time since onset of injury/illness/exacerbation, and 3+ comorbidities: AS, A-fib, Neuropathy are also affecting patient's functional outcome.   REHAB POTENTIAL: Excellent  CLINICAL DECISION MAKING: Evolving/moderate complexity  EVALUATION COMPLEXITY: Moderate   GOALS: Goals reviewed with patient? Yes  SHORT TERM GOALS: Target date: 04/07/2024   Patient will be able to show independence for initial HEP to include posture, core and hip strength and stability.   Baseline: Goal status:MET   2.  Patient will be complete functional mobility testing/balance and goal set   Baseline:  Goal status:MET   3.  Pt will modify workout schedule to a strength focus Baseline:  Goal status:MET    LONG TERM GOALS: Target date: 05/05/2024    Patient will be independent with final HEP upon discharge from PT and report consistent benefit following exercise completion.    Baseline:  Goal status: ongoing   2.  Pt will be able to demonstrate good standing balance as evidenced by balance recovery from min to mod dynamic balance challenges.  Baseline:  Goal status: ongoing   3.  Patient will be able to demonstrate hip/knee/ankle strength to 5/5 in order to maximize functional mobility, ambulation and lifting.   Baseline:  Goal status:ongoing   4.  Patient will be able to perform sit to stand x 10 in 30 sec Baseline: 18 sec x 5 , 16 reps in 30 sec  Goal status: MET   5.  Patient will feel more confident and stable in his balance when at church.   Baseline: reports was bad in 2020 but since he is working on it, this has been maintained currently.  Less balance confidence when dynamic.  Goal status: ongoing   6.  Pt will be able to stand for 30 min without increased LLE pain.  Baseline: does standing inc LE pain  Goal status: ongoing    PLAN:  PT FREQUENCY: 1-2x/week  PT DURATION: 8 weeks   PLANNED INTERVENTIONS: 02835- PT Re-evaluation, 97750- Physical Performance Testing, 97110-Therapeutic exercises, 97530- Therapeutic activity, [id number]- Neuromuscular re-education, 97535- Self Care, [id number]- Manual therapy, [id number]- Gait training, [phone]- Aquatic Therapy, [phone] (1-2 muscles), [id number] (3+ muscles)- Dry Needling, Patient/Family education, Balance training, Taping, Spinal mobilization, Cryotherapy, and Moist heat  PLAN FOR NEXT SESSION:   retest 5 time sit to stand,  review old HEP   Tinnie Don, PT, DPT 1:33 PM  06/16/24

## 2024-06-21 ENCOUNTER — Ambulatory Visit
Admission: RE | Admit: 2024-06-21 | Discharge: 2024-06-21 | Disposition: A | Source: Ambulatory Visit | Attending: Sports Medicine | Admitting: Sports Medicine

## 2024-06-21 DIAGNOSIS — M5386 Other specified dorsopathies, lumbar region: Secondary | ICD-10-CM

## 2024-06-21 DIAGNOSIS — M4727 Other spondylosis with radiculopathy, lumbosacral region: Secondary | ICD-10-CM | POA: Diagnosis not present

## 2024-06-27 ENCOUNTER — Ambulatory Visit: Admitting: Physical Therapy

## 2024-06-27 ENCOUNTER — Encounter: Payer: Self-pay | Admitting: Physical Therapy

## 2024-06-27 DIAGNOSIS — M6281 Muscle weakness (generalized): Secondary | ICD-10-CM

## 2024-06-27 DIAGNOSIS — R2689 Other abnormalities of gait and mobility: Secondary | ICD-10-CM

## 2024-06-27 NOTE — Therapy (Signed)
 OUTPATIENT PHYSICAL THERAPY LOWER EXTREMITY   Patient Name: Wesley [name]. MRN: [id number] DOB:08/13/1940, 83 y.o., male Today's Date: 06/27/2024   END OF SESSION:  PT End of Session - 06/27/24 1845     Visit Number 17    Number of Visits 20    Date for Recertification  06/29/24    Authorization Type Medicare, [employer]    PT Start Time 1018    PT Stop Time 1059    PT Time Calculation (min) 41 min    Activity Tolerance Patient tolerated treatment well    Behavior During Therapy WFL for tasks assessed/performed                    Past Medical History:  Diagnosis Date   A-fib (HCC)    cardioversion x2   Ankylosing spondylitis (HCC)    Atrial flutter (HCC)    GERD (gastroesophageal reflux disease)    Hemorrhoids    Irregular heart beat    MVP (mitral valve prolapse)    WITH A MIDSYSTOLIC CLICK   OSA (obstructive sleep apnea)    uses CPAP nightly   Peripheral neuropathy    Vitamin B12 deficiency    Past Surgical History:  Procedure Laterality Date   ABLATION     CARDIOVERSION N/A 12/20/2019   Procedure: CARDIOVERSION;  Surgeon: [name], MD;  Location: [facility];  Service: Cardiovascular;  Laterality: N/A;   COLONOSCOPY     INGUINAL HERNIA REPAIR     LESION REMOVAL N/A 05/22/2022   Procedure: EXCISION UPPER BACK SEBACEOUS CYST;  Surgeon: [name], MD;  Location: [facility];  Service: General;  Laterality: N/A;  LOCAL ANESTHESIA   venous ligation     for varices left lower leg   Patient Active Problem List   Diagnosis Date Noted   Sciatica of left side associated with disorder of lumbar spine 02/22/2024   Abnormality of gait 06/26/2021   Chronic hoarseness 10/04/2020   PAF (paroxysmal atrial fibrillation) (HCC)    DDD (degenerative disc disease), lumbar 10/28/2017   Bilateral bunions 08/20/2016   Varicose veins of right lower extremity with complications 01/07/2016   Bruit 10/10/2015   Varicose veins of leg with  complications 10/01/2015   Ankylosing spondylitis (HCC) 01/14/2015   Chronic anticoagulation 01/14/2015   Sleep apnea 01/14/2015   S/P ablation of atrial fibrillation 01/08/2015   Persistent atrial fibrillation (HCC) 01/08/2015   Orthostatic dizziness 12/01/2013   Calf pain 11/22/2012   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/22/2012   Peripheral neuropathy 10/21/2012   Pain in joint, ankle and foot 09/21/2012   Disturbance of skin sensation 09/21/2012   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 09/21/2012   Other general symptoms(780.99) 09/21/2012   Heel pain 06/07/2012   Plantar fasciitis 06/07/2012   Mitral valve prolapse 06/03/2011   Pain in joint of right shoulder 04/03/2011   Rotator cuff syndrome of right shoulder 04/03/2011   Paroxysmal atrial fibrillation (HCC) 01/20/2011   Hypercholesterolemia 01/20/2011   OBSTRUCTIVE SLEEP APNEA 10/08/2009    PCP: [name] MD   REFERRING PROVIDER: [name] MD/ [name] MD   REFERRING DIAG: [phone] (ICD-10-CM) - Left sided sciatica  THERAPY DIAG:  Muscle weakness (generalized)  Other abnormalities of gait and mobility  Rationale for Evaluation and Treatment: Rehabilitation  ONSET DATE: acute on chronic   SUBJECTIVE:   SUBJECTIVE STATEMENT: Patient with no new complaints. He brought in his binder of previous exercises for HEP   PERTINENT HISTORY: Dr. Harvey note  had sent him to neuro PT because of what I saw is a high fall risk No recent falls but he feels unsteady at times and even has had some retropulsion Today he has dynamic genu valgus on the right with hip abduction weakness I gave him a hip abduction series but want him to pursue this at physical therapy until we can get improved right hip strength  Patient saw Dr. Teressa 2 weeks ago for worsening sciatica that left leg.  He has a progressive peripheral neuropathy.  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  Is been  thought that his radicular symptoms probably originate in his lumbar spine.   PAIN:  Are you having pain? Yes: NPRS scale: 5/10    Pain location: L post thigh  Pain description: aching  Aggravating factors: sitting with knee flexed , overstretch  Relieving factors: gabapentin , stretching , moving , figure 4 stretch    PRECAUTIONS: None monitor balance  RED FLAGS: None   WEIGHT BEARING RESTRICTIONS: No  FALLS:  Has patient fallen in last 6 months? No but does note worsening balance lately.   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  PATIENT GOALS: To be rid of the pain   NEXT MD VISIT: as needed   OBJECTIVE:   Note: Objective measures were completed at Evaluation unless otherwise noted.  DIAGNOSTIC FINDINGS: none recent   PATIENT SURVEYS: NT     COGNITION: Overall cognitive status: Within functional limits for tasks assessed     SENSATION: Major numbness both legs, bottom 1/3 of my legs and feet  EDEMA:  None   MUSCLE LENGTH: Hamstrings: lacks about 10 deg each leg.  Rt LE SLR 55 and Lt. 45 deg  Thomas test: NT   POSTURE: rounded shoulders, forward head, increased thoracic kyphosis, posterior pelvic tilt, and flexed trunk   PALPATION: Pain to palpation of left piriformis but only minimal to moderate No pain in the left hamstring or calf  LOWER EXTREMITY ROM: Within functional limits Patient notices a greater sense of tightness in the left hamstring with passive stretching  Active ROM Right eval Left eval  Hip flexion    Hip extension    Hip abduction    Hip adduction    Hip internal rotation    Hip external rotation    Knee flexion    Knee extension    Ankle dorsiflexion    Ankle  plantarflexion    Ankle inversion    Ankle eversion     (Blank rows = not tested)  LOWER EXTREMITY MMT:  MMT Right eval Left eval Rt./Lt.  05/04/24  Hip flexion 5 5   Hip extension 4 4 4+/5, 4+/5  Hip abduction 3+ 4 3+/5, 4/5  Hip adduction     Hip internal rotation     Hip external rotation     Knee flexion 5 4+ 5/5, 4+/5  Knee extension 5 4+ 5/5, 4+/5  Ankle dorsiflexion     Ankle plantarflexion     Ankle inversion     Ankle eversion      (Blank rows = not tested)  LOWER EXTREMITY SPECIAL TESTS:  NT   FUNCTIONAL TESTS:  5 times sit  to stand: 17 sec  04/21/24: 5 X STS 12 sec  Needs UE assit to balance on one leg , hip compensation, trunk lean   GAIT: Distance walked: 150 Assistive device utilized: None Level of assistance: Modified independence Comments: Trunk flexed upper back rounded and shoulders forward.  Apparent decrease in proprioception in the lower body with decreased hip stability lack of arm swing and right foot slight lead turn and word                                                                                                                                 TREATMENT DATE:   [facility] Adult PT Treatment:                                                DATE: 06/27/24 Therapeutic Exercise: education: review of pts entire HEP in detail LTR x 15  Seated lumbar flexion stretch  in chair  S/L hip abd x 12 on R;  S/L clams x 12 on R Bridging 2 x 8 (cueing for form)   Manual Therapy: Self Care: Therapeutic Activity:     [facility] Adult PT Treatment:                                                DATE: 06/09/24 Therapeutic Exercise: LTR x 15  Seated HR 2 x 10 review for HEP  Standing HR x10  -discussed seated vs standing positions-  Standing hip abd 2 x 10 bil;  Reviewed bridge for HEP Manual Therapy: LAD L lumbar  Self Care: Therapeutic Activity: Fwd step ups 6 in x 10 bil;  Lateral step ups 6 in x 10 bil;  Standing hip abd 2 x 10 bil;       [facility] Adult PT Treatment:                                                DATE: 05/25/24 Therapeutic Exercise: LTR x 15  Bridging 2 x 10  Standing hip abd 2 x 10 bil;  Fig 4 piriformis after needling.  Manual Therapy: LAD bil lumbar  Manual hamstring stretch with ankle pump for nerve glide.  Self Care:  Trigger Point Dry Needling  Initial Treatment: Pt instructed on Dry Needling rational, procedures, and possible side effects. Pt instructed to expect mild to moderate muscle soreness later in the day and/or into the next day.  Pt instructed in methods to reduce muscle soreness. Pt instructed to continue prescribed HEP. Because Dry Needling was performed over or  adjacent to a lung field, pt was educated on S/S of pneumothorax and to seek immediate medical attention should they occur.  Patient was educated on signs and symptoms of infection and other risk factors and advised to seek medical attention should they occur.  Patient verbalized understanding of these instructions and education.   Patient Verbal Consent Given: Yes Education Handout Provided: Yes Muscles Treated: glute med/min , piriformis  Electrical Stimulation Performed: No Treatment Response/Outcome: twitch response, palpable increase in muscle length      PATIENT EDUCATION:  Education details: See above self-care Person educated: Patient Education method: Explanation, Demonstration, Tactile cues, Verbal cues, and Handouts Education comprehension: verbalized understanding and needs further education  HOME EXERCISE PROGRAM: Access Code: [email or web address]   ASSESSMENT:  CLINICAL IMPRESSION:  Pt continues to have pain in L lower leg as well as weakness in both legs that are limiting mobility. He brought his previous collection of HEP to appt today. We reviewed Hep in detail. We will continue to condense into a reasonable routine for him to perform at this time. Pt to benefit from continued care.    Last session:  Session emphasized balance and hip strength today, reviewed home routine and offer corrections to include increasing the time spent with his head turn in order to fully integrate the balance challenge.  He was able to eventually stand on 1 leg for about 10 seconds but the left side is markedly less stable.  Patient notes neuropathy may be getting worse and is definitely impacting his mobility.  He needs to visually see each step which lends itself to a bent over posture with gait.  He has decreased heel strike and walks with a heavy step.  We practiced walking with a cane and it did improve his stability.  He may be more open to consider a walking pole to improve his upright posture as well as using it for a balance tool at home.  Patient will continue to benefit from skilled PT in order to return to PLOF and optimize functional mobility.         OBJECTIVE IMPAIRMENTS: Abnormal gait, decreased balance, decreased coordination, decreased knowledge of condition, decreased mobility, difficulty walking, decreased strength, increased fascial restrictions, impaired flexibility, impaired UE functional use, improper body mechanics, postural dysfunction, and pain.   ACTIVITY LIMITATIONS: lifting, standing, stairs, transfers, and locomotion level  PARTICIPATION LIMITATIONS: community activity and occupation  PERSONAL FACTORS: Age, Time since onset of injury/illness/exacerbation, and 3+ comorbidities: AS, A-fib, Neuropathy are also affecting patient's functional outcome.   REHAB POTENTIAL: Excellent  CLINICAL DECISION MAKING: Evolving/moderate complexity  EVALUATION COMPLEXITY: Moderate   GOALS: Goals reviewed with patient? Yes  SHORT TERM GOALS: Target date: 04/07/2024   Patient will be able to show independence for initial HEP to include posture, core and hip strength and stability.   Baseline: Goal status:MET   2.  Patient will be complete functional mobility testing/balance and goal set   Baseline:  Goal status:MET   3.  Pt will modify workout schedule to a strength focus Baseline:  Goal status:MET    LONG TERM GOALS: Target date: 05/05/2024    Patient will be independent with final HEP upon discharge from PT and report consistent benefit following exercise completion.    Baseline:  Goal status: ongoing   2.  Pt will be able to demonstrate good standing balance as evidenced by balance recovery from min to mod dynamic balance challenges.   Baseline:  Goal status: ongoing   3.  Patient  will be able to demonstrate hip/knee/ankle strength to 5/5 in order to maximize functional mobility, ambulation and lifting.   Baseline:  Goal status:ongoing   4.  Patient will be able to perform sit to stand x 10 in 30 sec Baseline: 18 sec x 5 , 16 reps in 30 sec  Goal status: MET   5.  Patient will feel more confident and stable in his balance when at church.   Baseline: reports was bad in 2020 but since he is working on it, this has been maintained currently.  Less balance confidence when dynamic.  Goal status: ongoing   6.  Pt will be able to stand for 30 min without increased LLE pain.  Baseline: does standing inc LE pain  Goal status: ongoing    PLAN:  PT FREQUENCY: 1-2x/week  PT DURATION: 8 weeks   PLANNED INTERVENTIONS: 02835- PT Re-evaluation, 97750- Physical Performance Testing, 97110-Therapeutic exercises, 97530- Therapeutic activity, [id number]- Neuromuscular re-education, 97535- Self Care, 02859- Manual therapy, [id number]- Gait training, [phone]- Aquatic Therapy, [phone] (1-2 muscles), 20561 (3+ muscles)- Dry Needling, Patient/Family education, Balance training, Taping, Spinal mobilization, Cryotherapy, and Moist heat  PLAN FOR NEXT SESSION:   retest 5 time sit to stand,  review old HEP   Tinnie Don, PT, DPT 6:46 PM  06/27/24

## 2024-07-04 ENCOUNTER — Other Ambulatory Visit: Payer: Self-pay

## 2024-07-04 ENCOUNTER — Ambulatory Visit: Admitting: Sports Medicine

## 2024-07-04 ENCOUNTER — Encounter: Admitting: Physical Therapy

## 2024-07-04 VITALS — BP 115/61 | Ht 75.0 in | Wt 208.0 lb

## 2024-07-04 DIAGNOSIS — G8929 Other chronic pain: Secondary | ICD-10-CM

## 2024-07-04 DIAGNOSIS — M25512 Pain in left shoulder: Secondary | ICD-10-CM | POA: Diagnosis not present

## 2024-07-04 DIAGNOSIS — M7552 Bursitis of left shoulder: Secondary | ICD-10-CM | POA: Insufficient documentation

## 2024-07-04 MED ORDER — METHYLPREDNISOLONE ACETATE 40 MG/ML IJ SUSP
40.0000 mg | Freq: Once | INTRAMUSCULAR | Status: AC
  Administered 2024-07-04: 40 mg via INTRA_ARTICULAR

## 2024-07-04 NOTE — Progress Notes (Signed)
 Discussed the use of AI scribe software for clinical note transcription with the patient, who gave verbal consent to proceed.  History of Present Illness Wesley [name] is an 83 year old male who presents with new left shoulder pain.  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  Good strength on internal and external rotation of shoulders.  Good strength on empty can,  Speed's, and Yergason's tests normal  Pain in right AC joint right shoulder, not left. Some painful archon abduction  US  of left shoulder  Mild irregularity of humeral head Rotator cuff tendons: all are intact Minimal AC narrowing (Significant on RT) Bulls eye sign around biceps tendon On LAX there is a pseudo bursal swelling at Biceps  Impression:  Biceps tendonitis and bursitis  Ultrasound and interpretation by Helene NOVAK. Cristela Stalder, MD   Procedure:  Injection of Left shoulder posterior approach Consent obtained and verified. Time-out conducted. Noted no overlying erythema, induration, or other signs of local  infection. Skin prepped in a sterile fashion. Topical analgesic spray: Ethyl chloride. Completed without difficulty. Meds: 1 cc solumedrol 40 and 4 ccs 1% lidocaine  Advised to call if fevers/chills, erythema, induration, drainage, or persistent bleeding.    Patient tolerated procedure well.

## 2024-07-04 NOTE — Assessment & Plan Note (Signed)
 Motion exercises for shoulder Biceps exercise CSI today  Modify activity that irritates this  Reck 2 mos

## 2024-07-11 ENCOUNTER — Ambulatory Visit: Admitting: Physical Therapy

## 2024-07-11 DIAGNOSIS — H40013 Open angle with borderline findings, low risk, bilateral: Secondary | ICD-10-CM | POA: Diagnosis not present

## 2024-07-11 DIAGNOSIS — M6281 Muscle weakness (generalized): Secondary | ICD-10-CM | POA: Diagnosis not present

## 2024-07-11 DIAGNOSIS — R2689 Other abnormalities of gait and mobility: Secondary | ICD-10-CM | POA: Diagnosis not present

## 2024-07-11 NOTE — Therapy (Unsigned)
 OUTPATIENT PHYSICAL THERAPY LOWER EXTREMITY /Re-Cert   Patient Name: Wesley [name]. MRN: [id number] DOB:[birth year], 83 y.o., male Today's Date: 07/11/2024   END OF SESSION:  PT End of Session - 07/13/24 0815     Visit Number 18    Number of Visits 20    Date for Recertification  08/10/24    Authorization Type Medicare, Tricare    PT Start Time 1016    PT Stop Time 1102    PT Time Calculation (min) 46 min    Activity Tolerance Patient tolerated treatment well    Behavior During Therapy WFL for tasks assessed/performed                     Past Medical History:  Diagnosis Date   A-fib (HCC)    cardioversion x2   Ankylosing spondylitis (HCC)    Atrial flutter (HCC)    GERD (gastroesophageal reflux disease)    Hemorrhoids    Irregular heart beat    MVP (mitral valve prolapse)    WITH A MIDSYSTOLIC CLICK   OSA (obstructive sleep apnea)    uses CPAP nightly   Peripheral neuropathy    Vitamin B12 deficiency    Past Surgical History:  Procedure Laterality Date   ABLATION     CARDIOVERSION N/A 12/20/2019   Procedure: CARDIOVERSION;  Surgeon: [name], MD;  [facility];  Service: Cardiovascular;  Laterality: N/A;   COLONOSCOPY     INGUINAL HERNIA REPAIR     LESION REMOVAL N/A 05/22/2022   Procedure: EXCISION UPPER BACK SEBACEOUS CYST;  Surgeon: [name], MD;  Location: [facility];  Service: General;  Laterality: N/A;  LOCAL ANESTHESIA   venous ligation     for varices left lower leg   Patient Active Problem List   Diagnosis Date Noted   Bursitis of left shoulder 07/04/2024   Sciatica of left side associated with disorder of lumbar spine 02/22/2024   Abnormality of gait 06/26/2021   Chronic hoarseness 10/04/2020   PAF (paroxysmal atrial fibrillation) (HCC)    DDD (degenerative disc disease), lumbar 10/28/2017   Bilateral bunions 08/20/2016   Varicose veins of right lower extremity with complications 01/07/2016   Bruit  10/10/2015   Varicose veins of leg with complications 10/01/2015   Ankylosing spondylitis (HCC) 01/14/2015   Chronic anticoagulation 01/14/2015   Sleep apnea 01/14/2015   S/P ablation of atrial fibrillation 01/08/2015   Persistent atrial fibrillation (HCC) 01/08/2015   Orthostatic dizziness 12/01/2013   Calf pain 11/22/2012   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/22/2012   Peripheral neuropathy 10/21/2012   Pain in joint, ankle and foot 09/21/2012   Disturbance of skin sensation 09/21/2012   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 09/21/2012   Other general symptoms(780.99) 09/21/2012   Heel pain 06/07/2012   Plantar fasciitis 06/07/2012   Mitral valve prolapse 06/03/2011   Pain in joint of right shoulder 04/03/2011   Rotator cuff syndrome of right shoulder 04/03/2011   Paroxysmal atrial fibrillation (HCC) 01/20/2011   Hypercholesterolemia 01/20/2011   OBSTRUCTIVE SLEEP APNEA 10/08/2009    PCP: [name] MD   REFERRING PROVIDER: [name] MD/ [name] MD   REFERRING DIAG: [phone] (ICD-10-CM) - Left sided sciatica  THERAPY DIAG:  Muscle weakness (generalized)  Other abnormalities of gait and mobility  Rationale for Evaluation and Treatment: Rehabilitation  ONSET DATE: acute on chronic   SUBJECTIVE:   SUBJECTIVE STATEMENT: Patient with no new complaints. He brought in his binder of previous exercises  for HEP to review. He has been doing regular exercise and does feel that his leg pain is improving some. He has also had increased pain in L shoulder, saw MD this week and got injection for that which is helping.    PERTINENT HISTORY: Dr. Harvey note  had sent him to neuro PT because of what I saw is a high fall risk No recent falls but he feels unsteady at times and even has had some retropulsion Today he has dynamic genu valgus on the right with hip abduction weakness I gave him a hip abduction series but want him to pursue this at physical therapy until we  can get improved right hip strength  Patient saw Dr. Teressa 2 weeks ago for worsening sciatica that left leg.  He has a progressive peripheral neuropathy.  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  Is been thought that his radicular symptoms probably originate in his lumbar spine.   PAIN:  Are you having pain? Yes: NPRS scale: up to 5/10    Pain location: L post thigh  Pain description: aching  Aggravating factors: sitting with knee flexed , overstretch  Relieving factors: gabapentin , stretching , moving , figure 4 stretch    PRECAUTIONS: None monitor balance  RED FLAGS: None   WEIGHT BEARING RESTRICTIONS: No  FALLS:  Has patient fallen in last 6 months? No but does note worsening balance lately.   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  PATIENT GOALS: To be rid of the pain   NEXT MD VISIT: as needed   OBJECTIVE:   Note: Objective measures were completed at Evaluation unless otherwise noted.  DIAGNOSTIC FINDINGS: none recent   PATIENT SURVEYS: NT     COGNITION: Overall cognitive status: Within functional limits for tasks assessed     SENSATION: Major numbness both legs, bottom 1/3 of my legs and feet  EDEMA:  None   MUSCLE LENGTH: Hamstrings: lacks about 10 deg each leg.  Rt LE SLR 55 and Lt. 45 deg  Thomas test: NT   POSTURE: rounded shoulders, forward head, increased thoracic kyphosis, posterior pelvic tilt, and flexed trunk   PALPATION: Pain to palpation of left piriformis but only minimal to moderate No pain in the left hamstring or calf  LOWER EXTREMITY ROM: Within functional limits Patient notices a greater sense of tightness in the left hamstring with  passive stretching  Active ROM Right eval Left eval  Hip flexion    Hip extension    Hip abduction    Hip adduction    Hip internal rotation    Hip external rotation    Knee flexion    Knee extension    Ankle dorsiflexion    Ankle plantarflexion    Ankle inversion    Ankle eversion     (Blank rows = not tested)  LOWER EXTREMITY MMT:  MMT Right eval Left eval Rt./Lt.  05/04/24 Rt/Lt 07/11/24  Hip flexion 5 5    Hip extension 4 4 4+/5, 4+/5 4+/5, 4+/5  Hip abduction 3+ 4 3+/5, 4/5 4/5, 4/5  Hip adduction      Hip internal rotation      Hip external rotation  Knee flexion 5 4+ 5/5, 4+/5 5/5, 4+/5   Knee extension 5 4+ 5/5, 4+/5 5/5, 5/5  Ankle dorsiflexion      Ankle plantarflexion      Ankle inversion      Ankle eversion       (Blank rows = not tested)  LOWER EXTREMITY SPECIAL TESTS:  NT   FUNCTIONAL TESTS:  5 times sit to stand: 17 sec  04/21/24: 5 X STS 12 sec  Needs UE assit to balance on one leg , hip compensation, trunk lean   07/11/24: 14.29  from regular height chair, no Ue support.   GAIT: Distance walked: 150 Assistive device utilized: None Level of assistance: Modified independence Comments: Trunk flexed upper back rounded and shoulders forward.  Apparent decrease in proprioception in the lower body with decreased hip stability lack of arm swing and right foot slight lead turn and word                                                                                                                                 TREATMENT DATE:   [facility] Adult PT Treatment:                                                DATE: 12/16 /25 Therapeutic Exercise: education: review of pts entire HEP in detail Seated hamstring stretch x 3;  Seated lumbar flexion stretch  in chair  Supine SKTC, and DKTC- reviewed options for decreasing end range knee flexion/discomfort with hand behind thigh.  S/L hip abd x 12 on R;  S/L clams x 12 on R Fwd weight shifts/staggered  stance x 20 bil cueing to keep front knee straight.  Manual Therapy: Self Care:  current presentation, need for continued strength.  Therapeutic Activity:   reviewed options for kneeling at church for knee pain, pt was sitting in a low, cross-leg position, too much tension in knees,      [facility] Adult PT Treatment:                                                DATE: 06/27/24 Therapeutic Exercise: education: review of pts entire HEP in detail LTR x 15  Seated lumbar flexion stretch  in chair  S/L hip abd x 12 on R;  S/L clams x 12 on R Bridging 2 x 8 (cueing for form)   Manual Therapy: Self Care: Therapeutic Activity:   PATIENT EDUCATION:  Education details: See above self-care Person educated: Patient Education method: Explanation, Demonstration, Tactile cues, Verbal cues, and Handouts Education comprehension: verbalized understanding and needs further education  HOME EXERCISE PROGRAM: Access Code: [email or web address] Part B:  Access Code:  [email or web address]   ASSESSMENT:  CLINICAL IMPRESSION: Continued focus on condensing his HEP to manageable routine. We discussed importance of continued mobility for back, and strengthening for LEs and hips for optimal outcome. We reviewed his HEP again in detail today. He does have weakness that will benefit from continued strengthening. Pt to be seen for 1-2 more visits, and likely d/c to HEP. He will benefit from review of standing hip strength next visit. He does seem to be having less sciatic pain in the last couple weeks.    Last session: Session emphasized balance and hip strength today, reviewed home routine and offer corrections to include increasing the time spent with his head turn in order to fully integrate the balance challenge.  He was able to eventually stand on 1 leg for about 10 seconds but the left side is markedly less stable.  Patient notes neuropathy may be getting worse and is definitely impacting his mobility.  He needs to visually see each  step which lends itself to a bent over posture with gait.  He has decreased heel strike and walks with a heavy step.  We practiced walking with a cane and it did improve his stability.  He may be more open to consider a walking pole to improve his upright posture as well as using it for a balance tool at home.  Patient will continue to benefit from skilled PT in order to return to PLOF and optimize functional mobility.         OBJECTIVE IMPAIRMENTS: Abnormal gait, decreased balance, decreased coordination, decreased knowledge of condition, decreased mobility, difficulty walking, decreased strength, increased fascial restrictions, impaired flexibility, impaired UE functional use, improper body mechanics, postural dysfunction, and pain.   ACTIVITY LIMITATIONS: lifting, standing, stairs, transfers, and locomotion level  PARTICIPATION LIMITATIONS: community activity and occupation  PERSONAL FACTORS: Age, Time since onset of injury/illness/exacerbation, and 3+ comorbidities: AS, A-fib, Neuropathy are also affecting patient's functional outcome.   REHAB POTENTIAL: Excellent  CLINICAL DECISION MAKING: Evolving/moderate complexity  EVALUATION COMPLEXITY: Moderate   GOALS: Goals reviewed with patient? Yes  SHORT TERM GOALS: Target date: 04/07/2024   Patient will be able to show independence for initial HEP to include posture, core and hip strength and stability.   Baseline: Goal status:MET   2.  Patient will be complete functional mobility testing/balance and goal set  Baseline:  Goal status:MET   3.  Pt will modify workout schedule to a strength focus Baseline:  Goal status:MET    LONG TERM GOALS: Target date: 1/15/ 2026    Patient will be independent with final HEP upon discharge from PT and report consistent benefit following exercise completion.    Baseline:  Goal status: ongoing   2.  Pt will be able to demonstrate good standing balance as evidenced by balance recovery from min  to mod dynamic balance challenges.   Baseline:  Goal status: ongoing   3.  Patient will be able to demonstrate hip/knee/ankle strength to 5/5 in order to maximize functional mobility, ambulation and lifting.   Baseline:  Goal status:ongoing   4.  Patient will be able to perform sit to stand x 10 in 30 sec Baseline: 18 sec x 5 , 16 reps in 30 sec  Goal status: MET   5.  Patient will feel more confident and stable in his balance when at church.   Baseline: reports was bad in 2020 but since he is working on it, this has been maintained currently.  Less balance confidence when  dynamic.  Goal status: ongoing   6.  Pt will be able to stand for 30 min without increased LLE pain.  Baseline: does standing , inc LE pain  Goal status: partially met.    PLAN:  PT FREQUENCY: 1-2x/week  PT DURATION: 8 weeks   PLANNED INTERVENTIONS: 97164- PT Re-evaluation, 97750- Physical Performance Testing, 97110-Therapeutic exercises, 97530- Therapeutic activity, [id number]- Neuromuscular re-education, 97535- Self Care, 02859- Manual therapy, [id number]- Gait training, [phone]- Aquatic Therapy, [phone] (1-2 muscles), 20561 (3+ muscles)- Dry Needling, Patient/Family education, Balance training, Taping, Spinal mobilization, Cryotherapy, and Moist heat  PLAN FOR NEXT SESSION:   test stairs, review standing strength, hip strength, D/C.    Tinnie Don, PT, DPT 8:15 AM  07/13/2024

## 2024-07-12 ENCOUNTER — Encounter: Payer: Self-pay | Admitting: Cardiology

## 2024-07-12 ENCOUNTER — Telehealth: Payer: Self-pay | Admitting: Cardiology

## 2024-07-12 NOTE — Telephone Encounter (Signed)
 Patient c/o Palpitations:  STAT if patient reporting lightheadedness, shortness of breath, or chest pain  How long have you had palpitations/irregular HR/ Afib? Patient states catha is telling him he might be in A-fib. Are you having the symptoms now? yes  Are you currently experiencing lightheadedness, SOB or CP? No at the moment, but this morning he did have SOB. He stated the SOB has resolved and is not SOB.   Do you have a history of afib (atrial fibrillation) or irregular heart rhythm? Patient has history of a-fib  Have you checked your BP or HR? (document readings if available): HR 136-145  Are you experiencing any other symptoms? No other symptoms.

## 2024-07-12 NOTE — Telephone Encounter (Signed)
 Received incoming STAT call regarding HR 140's and possible Afib on [facility] device. Pt states he has had 2 Ablations in the past and also DCCV's. He has been struggling with an accelerated heart rate the last 2-3 years. Per pt, he wore a heart monitor in November 2025 thru Dr. Cesario office.   This morning he woke up around 6:30 am, stood up (too quickly possibly) and felt Dizzy so he laid back down. He then got ready and ended up going to the gym and did about 30 min on the Eleptical machine. He saw Dr. Dominick (retired Development Worker, [employer]) at [employer] and asked him about the episode and he advised pt to get in touch with us .   He is on prescribed Cardiac Medications (is on Apixaban  5 mg BID). He took an extra dose of Toprol  XL 25 mg last night around 9:00 pm, had today's dose around 11:30 am. He does not have any BP information currently.   Scheduled pt to see DOD Adine) on 07/13/24 as he was very hesitant to wait until 07/25/24 to see APP. ER precautions reviewed with pt who verbalized understanding. He will try to upload the [facility] EKG on MyChart.

## 2024-07-13 ENCOUNTER — Ambulatory Visit: Admitting: Cardiovascular Disease

## 2024-07-13 ENCOUNTER — Encounter: Payer: Self-pay | Admitting: Cardiovascular Disease

## 2024-07-13 ENCOUNTER — Encounter: Payer: Self-pay | Admitting: Physical Therapy

## 2024-07-13 VITALS — BP 98/56 | Ht 75.0 in | Wt 212.0 lb

## 2024-07-13 DIAGNOSIS — I480 Paroxysmal atrial fibrillation: Secondary | ICD-10-CM | POA: Insufficient documentation

## 2024-07-13 NOTE — Progress Notes (Signed)
 Chief Complaint  Patient presents with   Follow-up    Atrial fibrillation    History of Present Illness: 83 yo male with persistent atrial fibrillation and sleep apnea who is here today as an add on to my schedule with c/o tachycardia. He is followed in our office by Dr. Pietro. He has long standing atrial fibrillation. He had an ablation in 2011 and again in 2021 at [facility]. He is followed in the EP clinic at [facility] by Dr. Katina. Echo August 2023 with normal LV systolic function. Cardiac monitor in 2023 with sinus and less than 1% atrial fib burden with frequent PACs (13%). He has been managed with Toprol  and Multaq. He is on Eliquis . He had a three day cardiac monitor worn from [facility] in October 2025 and he had no atrial fib.   He called our office this week reporting elevated heart rates on his [facility] device. He tells me that he felt weak and dizzy yesterday when he woke up.  His device showed elevated heart rates overnight. He feels well today. No chest pain, dyspnea or weakness.   Primary Care Physician: [name], MD   Past Medical History:  Diagnosis Date   A-fib [facility])    cardioversion x2   Ankylosing spondylitis (HCC)    Atrial flutter (HCC)    GERD (gastroesophageal reflux disease)    Hemorrhoids    Irregular heart beat    MVP (mitral valve prolapse)    WITH A MIDSYSTOLIC CLICK   OSA (obstructive sleep apnea)    uses CPAP nightly   Peripheral neuropathy    Vitamin B12 deficiency     Past Surgical History:  Procedure Laterality Date   ABLATION     CARDIOVERSION N/A 12/20/2019   Procedure: CARDIOVERSION;  Surgeon: [name], MD;  Location: [facility];  Service: Cardiovascular;  Laterality: N/A;   COLONOSCOPY     INGUINAL HERNIA REPAIR     LESION REMOVAL N/A 05/22/2022   Procedure: EXCISION UPPER BACK SEBACEOUS CYST;  Surgeon: [name], MD;  Location: [facility];  Service: General;  Laterality: N/A;  LOCAL ANESTHESIA   venous ligation      for varices left lower leg    Current Outpatient Medications  Medication Sig Dispense Refill   acidophilus (RISAQUAD) CAPS capsule Take 1 capsule by mouth daily.     Alpha-Lipoic Acid 600 MG CAPS Take 600 mg by mouth daily.     apixaban  (ELIQUIS ) 5 MG TABS tablet Take 1 tablet (5 mg total) by mouth 2 (two) times daily. 180 tablet 1   Ascorbic Acid (VITAMIN C) 1000 MG tablet Take 1,000 mg by mouth daily.     b complex vitamins capsule Take 1 capsule by mouth daily.     Cyanocobalamin 5000 MCG SUBL Place 1 tablet under the tongue daily.      dronedarone (MULTAQ) 400 MG tablet Take 400 mg by mouth 2 (two) times daily with a meal.     gabapentin  (NEURONTIN ) 300 MG capsule Take 1 capsule (300 mg total) by mouth 4 (four) times daily. 360 capsule 3   metoprolol  succinate (TOPROL -XL) 25 MG 24 hr tablet TAKE 1 TABLET DAILY 90 tablet 3   Multiple Vitamin (MULTIVITAMIN) tablet Take 1 tablet by mouth daily.     Multiple Vitamins-Minerals (PRESERVISION AREDS 2+MULTI VIT) CAPS Take 1 tablet by mouth daily.      NEXIUM  40 MG capsule TAKE 1 CAPSULE DAILY 90 capsule 3   rosuvastatin (CRESTOR)  5 MG tablet Take 5 mg by mouth daily. Taking 1/2 tab every other day     Turmeric (QC TUMERIC COMPLEX) 500 MG CAPS Take 500 mg by mouth daily. 2 Capsules Daily     predniSONE  (DELTASONE ) 10 MG tablet Use as directed per doctors orders for the next 6 days. (Patient not taking: Reported on 07/13/2024) 21 tablet 0   No current facility-administered medications for this visit.    Allergies[1]  Social History   Socioeconomic History   Marital status: Married    Spouse name: Not on file   Number of children: 2   Years of education: 18   Highest education level: Not on file  Occupational History   Occupation: Investment Banker, [employer]: [employer]  Tobacco Use   Smoking status: Former    Current packs/day: 0.00    Types: Cigarettes    Quit date: 07/27/1969    Years since quitting: 55.0   Smokeless tobacco:  Never   Tobacco comments:    less than 1 pack/day  Vaping Use   Vaping status: Never Used  Substance and Sexual Activity   Alcohol  use: Yes    Comment: rare   Drug use: No   Sexual activity: Not on file  Other Topics Concern   Not on file  Social History Narrative   Not on file   Social Drivers of Health   Tobacco Use: Medium Risk (07/13/2024)   Patient History    Smoking Tobacco Use: Former    Smokeless Tobacco Use: Never    Passive Exposure: Not on Actuary Strain: Not on file  Food Insecurity: Not on file  Transportation Needs: Not on file  Physical Activity: Not on file  Stress: Not on file  Social Connections: Unknown (12/07/2021)   Received from [facility]   Social Network    Social Network: Not on file  Intimate Partner Violence: Unknown (10/30/2021)   Received from [facility]   HITS    Physically Hurt: Not on file    Insult or Talk Down To: Not on file    Threaten Physical Harm: Not on file    Scream or Curse: Not on file  Depression (PHQ2-9): Not on file  Alcohol  Screen: Not on file  Housing: Not on file  Utilities: Not on file  Health Literacy: Not on file    Family History  Problem Relation Age of Onset   Prostate cancer Father    Arthritis Father    Cancer Father    Heart failure Mother    Hypertension Mother     Review of Systems:  As stated in the HPI and otherwise negative.   BP (!) 98/56   Ht 6' 3 (1.905 m)   Wt 212 lb (96.2 kg)   BMI 26.50 kg/m   Physical Examination: General: Well developed, well nourished, NAD  HEENT: OP clear, mucus membranes moist  SKIN: warm, dry. No rashes. Neuro: No focal deficits  Musculoskeletal: Muscle strength 5/5 all ext  Psychiatric: Mood and affect normal  Neck: No JVD, no carotid bruits, no thyromegaly, no lymphadenopathy.  Lungs:Clear bilaterally, no wheezes, rhonci, crackles Cardiovascular: Regular rate and rhythm. No murmurs, gallops or rubs. Abdomen:Soft. Bowel sounds  present. Non-tender.  Extremities: No lower extremity edema. Pulses are 2 + in the bilateral DP/PT.  EKG:  EKG is ordered today. The ekg ordered today demonstrates  EKG Interpretation Date/Time:  Thursday July 13 2024 09:34:08 EST Ventricular Rate:  61 PR Interval:  246 QRS Duration:  128 QT Interval:  436 QTC Calculation: 438 R Axis:   -45  Text Interpretation: Sinus rhythm with sinus arrhythmia with 1st degree A-V block Left axis deviation Non-specific intra-ventricular conduction block When compared with ECG of 16-Feb-2024 15:24, Confirmed by Verlin Bruckner [phone]) on 07/13/2024 9:52:48 AM    Recent Labs: No results found for requested labs within last 365 days.   Lipid Panel    Component Value Date/Time   CHOL 209 (H) 06/20/2013 0856   TRIG 73.0 06/20/2013 0856   HDL 57.10 06/20/2013 0856   CHOLHDL 4 06/20/2013 0856   VLDL 14.6 06/20/2013 0856   LDLCALC 125 (H) 10/19/2012 0836   LDLDIRECT 144.2 06/20/2013 0856     Wt Readings from Last 3 Encounters:  07/13/24 212 lb (96.2 kg)  07/04/24 208 lb (94.3 kg)  06/15/24 208 lb (94.3 kg)    Assessment and Plan:   1. Atrial fibrillation, paroxysmal: He is sinus today. He has long standing PAF but mostly has been in sinus over the last few years. He has been followed in EP at [facility] in [place]. I suspect that his most recent episode was due to his upper respiratory infection. I will continue Multaq, Toprol  and Eliquis .  He is interested in referral to EP here.  I will refer him to see Dr. Almetta.  I have encouraged increased po intake of water given soft BP today.   Labs/ tests ordered today include:   Orders Placed This Encounter  Procedures   Ambulatory referral to Cardiac Electrophysiology   EKG 12-Lead   Disposition:   F/U with Dr. Pietro as planned. Will arrange close f/u in our EP clinic to establish EP care here locally   Signed, Bruckner Verlin, MD, [facility] 07/13/2024 10:39 AM    [facility] [address] [place], [place], [place]  [address] Phone: [phone]; Fax: [phone]       [1] No Known Allergies

## 2024-07-13 NOTE — Patient Instructions (Signed)
 Medication Instructions:  Your physician recommends that you continue on your current medications as directed. Please refer to the Current Medication list given to you today.  *If you need a refill on your cardiac medications before your next appointment, please call your pharmacy*  Lab Work: none If you have labs (blood work) drawn today and your tests are completely normal, you will receive your results only by: MyChart Message (if you have MyChart) OR A paper copy in the mail If you have any lab test that is abnormal or we need to change your treatment, we will call you to review the results.  Testing/Procedures: none  Follow-Up: At [facility], you and your health needs are our priority.  As part of our continuing mission to provide you with exceptional heart care, our providers are all part of one team.  This team includes your primary Cardiologist (physician) and Advanced Practice Providers or APPs (Physician Assistants and Nurse Practitioners) who all work together to provide you with the care you need, when you need it.  Your next appointment:   6-8 weeks  Provider:   Dr Almetta    We recommend signing up for the patient portal called MyChart.  Sign up information is provided on this After Visit Summary.  MyChart is used to connect with patients for Virtual Visits (Telemedicine).  Patients are able to view lab/test results, encounter notes, upcoming appointments, etc.  Non-urgent messages can be sent to your provider as well.   To learn more about what you can do with MyChart, go to [email or web address].   Other Instructions

## 2024-07-18 ENCOUNTER — Encounter: Payer: Self-pay | Admitting: Physical Therapy

## 2024-07-18 ENCOUNTER — Ambulatory Visit (INDEPENDENT_AMBULATORY_CARE_PROVIDER_SITE_OTHER): Admitting: Physical Therapy

## 2024-07-18 DIAGNOSIS — R2689 Other abnormalities of gait and mobility: Secondary | ICD-10-CM

## 2024-07-18 DIAGNOSIS — M6281 Muscle weakness (generalized): Secondary | ICD-10-CM | POA: Diagnosis not present

## 2024-07-18 NOTE — Therapy (Signed)
 " OUTPATIENT PHYSICAL THERAPY LOWER EXTREMITY / D/C   Patient Name: Wesley [name]. MRN: [id number] DOB:11-16-1940, 83 y.o., male Today's Date: 12/ 23 /2025   END OF SESSION:  PT End of Session - 07/18/24 1203     Visit Number 19    Number of Visits 20    Date for Recertification  08/10/24    Authorization Type Medicare, Tricare    PT Start Time 1017    PT Stop Time 1103    PT Time Calculation (min) 46 min    Activity Tolerance Patient tolerated treatment well    Behavior During Therapy WFL for tasks assessed/performed                      Past Medical History:  Diagnosis Date   A-fib (HCC)    cardioversion x2   Ankylosing spondylitis (HCC)    Atrial flutter (HCC)    GERD (gastroesophageal reflux disease)    Hemorrhoids    Irregular heart beat    MVP (mitral valve prolapse)    WITH A MIDSYSTOLIC CLICK   OSA (obstructive sleep apnea)    uses CPAP nightly   Peripheral neuropathy    Vitamin B12 deficiency    Past Surgical History:  Procedure Laterality Date   ABLATION     CARDIOVERSION N/A 12/20/2019   Procedure: CARDIOVERSION;  Surgeon: [name], MD;  Location: [facility];  Service: Cardiovascular;  Laterality: N/A;   COLONOSCOPY     INGUINAL HERNIA REPAIR     LESION REMOVAL N/A 05/22/2022   Procedure: EXCISION UPPER BACK SEBACEOUS CYST;  Surgeon: [name], MD;  Location: [facility];  Service: General;  Laterality: N/A;  LOCAL ANESTHESIA   venous ligation     for varices left lower leg   Patient Active Problem List   Diagnosis Date Noted   Bursitis of left shoulder 07/04/2024   Sciatica of left side associated with disorder of lumbar spine 02/22/2024   Abnormality of gait 06/26/2021   Chronic hoarseness 10/04/2020   PAF (paroxysmal atrial fibrillation) (HCC)    DDD (degenerative disc disease), lumbar 10/28/2017   Bilateral bunions 08/20/2016   Varicose veins of right lower extremity with complications 01/07/2016   Bruit  10/10/2015   Varicose veins of leg with complications 10/01/2015   Ankylosing spondylitis (HCC) 01/14/2015   Chronic anticoagulation 01/14/2015   Sleep apnea 01/14/2015   S/P ablation of atrial fibrillation 01/08/2015   Persistent atrial fibrillation (HCC) 01/08/2015   Orthostatic dizziness 12/01/2013   Calf pain 11/22/2012   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/22/2012   Peripheral neuropathy 10/21/2012   Pain in joint, ankle and foot 09/21/2012   Disturbance of skin sensation 09/21/2012   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 09/21/2012   Other general symptoms(780.99) 09/21/2012   Heel pain 06/07/2012   Plantar fasciitis 06/07/2012   Mitral valve prolapse 06/03/2011   Pain in joint of right shoulder 04/03/2011   Rotator cuff syndrome of right shoulder 04/03/2011   Paroxysmal atrial fibrillation (HCC) 01/20/2011   Hypercholesterolemia 01/20/2011   OBSTRUCTIVE SLEEP APNEA 10/08/2009    PCP: [name] MD   REFERRING PROVIDER: [name] MD/ [name] MD   REFERRING DIAG: [phone] (ICD-10-CM) - Left sided sciatica  THERAPY DIAG:  Muscle weakness (generalized)  Other abnormalities of gait and mobility  Rationale for Evaluation and Treatment: Rehabilitation  ONSET DATE: acute on chronic   SUBJECTIVE:   SUBJECTIVE STATEMENT: Patient with no new complaints. He has been  doing his condensed HEP and is doing well with it.    PERTINENT HISTORY: Dr. Harvey note  had sent him to neuro PT because of what I saw is a high fall risk No recent falls but he feels unsteady at times and even has had some retropulsion Today he has dynamic genu valgus on the right with hip abduction weakness I gave him a hip abduction series but want him to pursue this at physical therapy until we can get improved right hip strength  Patient saw Dr. Teressa 2 weeks ago for worsening sciatica that left leg.  He has a progressive peripheral neuropathy.  He has a diagnosis that started when he  was young of ankylosing spondylitis.  Is been thought that his radicular symptoms probably originate in his lumbar spine.   PAIN:  Are you having pain? Yes: NPRS scale: up to 5/10    Pain location: L post thigh  Pain description: aching  Aggravating factors: sitting with knee flexed , overstretch  Relieving factors: gabapentin , stretching , moving , figure 4 stretch    PRECAUTIONS: None monitor balance  RED FLAGS: None   WEIGHT BEARING RESTRICTIONS: No  FALLS:  Has patient fallen in last 6 months? No but does note worsening balance lately.   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  PATIENT GOALS: To be rid of the pain   NEXT MD VISIT: as needed   OBJECTIVE:   Note: Objective measures were completed at Evaluation unless otherwise noted.  DIAGNOSTIC FINDINGS: none recent   PATIENT SURVEYS: NT     COGNITION: Overall cognitive status: Within functional limits for tasks assessed     SENSATION: Major numbness both legs, bottom 1/3 of my legs and feet  EDEMA:  None   MUSCLE LENGTH: Hamstrings: lacks about 10 deg each leg.  Rt LE SLR 55 and Lt. 45 deg  Thomas test: NT   POSTURE: rounded shoulders, forward head, increased thoracic kyphosis, posterior pelvic tilt, and flexed trunk   PALPATION: Pain to palpation of left piriformis but only minimal to moderate No pain in the left hamstring or calf  LOWER EXTREMITY ROM: Within functional limits Patient notices a greater sense of tightness in the left hamstring with passive stretching  Active ROM Right eval Left eval  Hip flexion    Hip extension    Hip abduction    Hip adduction    Hip internal rotation    Hip external rotation    Knee flexion     Knee extension    Ankle dorsiflexion    Ankle plantarflexion    Ankle inversion    Ankle eversion     (Blank rows = not tested)  LOWER EXTREMITY MMT:  MMT Right eval Left eval Rt./Lt.  05/04/24 Rt/Lt 07/11/24  Hip flexion 5 5    Hip extension 4 4 4+/5, 4+/5 4+/5, 4+/5  Hip abduction 3+ 4 3+/5, 4/5 4/5, 4/5  Hip adduction      Hip internal rotation      Hip external rotation      Knee flexion 5 4+ 5/5, 4+/5 5/5, 4+/5   Knee extension 5 4+ 5/5, 4+/5 5/5, 5/5  Ankle dorsiflexion      Ankle plantarflexion  Ankle inversion      Ankle eversion       (Blank rows = not tested)  LOWER EXTREMITY SPECIAL TESTS:  NT   FUNCTIONAL TESTS:  5 times sit to stand: 17 sec  04/21/24: 5 X STS 12 sec  Needs UE assit to balance on one leg , hip compensation, trunk lean   07/11/24: 14.29  from regular height chair, no Ue support.   GAIT: Distance walked: 150 Assistive device utilized: None Level of assistance: Modified independence Comments: Trunk flexed upper back rounded and shoulders forward.  Apparent decrease in proprioception in the lower body with decreased hip stability lack of arm swing and right foot slight lead turn and word                                                                                                                                 TREATMENT DATE:   [facility] Adult PT Treatment:                                                DATE: 12/ 23 /25 Therapeutic Exercise: education: review of pts HEP  Standing hip abd and ext with RTB at thighs  Side stepping with RTB at thighs  Manual Therapy: Self Care:  current presentation, need for continued strength., need for cane use if he feels his balance worsens.  Therapeutic Activity:     Sit to stand x 10  Stairs: up/down 5 steps with 1 light UE support x 5  Step throughs x 15 bil;      [facility] Adult PT Treatment:                                                DATE: 12/16 /25 Therapeutic Exercise: education:  review of pts entire HEP in detail Seated hamstring stretch x 3;  Seated lumbar flexion stretch  in chair  Supine SKTC, and DKTC- reviewed options for decreasing end range knee flexion/discomfort with hand behind thigh.  S/L hip abd x 12 on R;  S/L clams x 12 on R Fwd weight shifts/staggered stance x 20 bil cueing to keep front knee straight.  Manual Therapy: Self Care:  current presentation, need for continued strength.  Therapeutic Activity:   reviewed options for kneeling at church for knee pain, pt was sitting in a low, cross-leg position, too much tension in knees,      [facility] Adult PT Treatment:  DATE: 06/27/24 Therapeutic Exercise: education: review of pts entire HEP in detail LTR x 15  Seated lumbar flexion stretch  in chair  S/L hip abd x 12 on R;  S/L clams x 12 on R Bridging 2 x 8 (cueing for form)   Manual Therapy: Self Care: Therapeutic Activity:   PATIENT EDUCATION:  Education details: See above self-care Person educated: Patient Education method: Explanation, Demonstration, Tactile cues, Verbal cues, and Handouts Education comprehension: verbalized understanding and needs further education  HOME EXERCISE PROGRAM: Access Code: [email or web address] Part B:  Access Code: [email or web address]   ASSESSMENT:  CLINICAL IMPRESSION: Continued focus on  HEP education and importance of continuing strengthening for his LEs.  He does have weakness that will benefit from continued strengthening. He is doing well at this time, with improving sciatic pain. We discussed use of cane in the future if he feels his balance/Leg weakness decreases or he becomes more unsafe. Pt ready for d/c at this time, pt in agreement with plan. He has met most goals.   Last session: Session emphasized balance and hip strength today, reviewed home routine and offer corrections to include increasing the time spent with his head turn in order to fully integrate the balance  challenge.  He was able to eventually stand on 1 leg for about 10 seconds but the left side is markedly less stable.  Patient notes neuropathy may be getting worse and is definitely impacting his mobility.  He needs to visually see each step which lends itself to a bent over posture with gait.  He has decreased heel strike and walks with a heavy step.  We practiced walking with a cane and it did improve his stability.  He may be more open to consider a walking pole to improve his upright posture as well as using it for a balance tool at home.  Patient will continue to benefit from skilled PT in order to return to PLOF and optimize functional mobility.         OBJECTIVE IMPAIRMENTS: Abnormal gait, decreased balance, decreased coordination, decreased knowledge of condition, decreased mobility, difficulty walking, decreased strength, increased fascial restrictions, impaired flexibility, impaired UE functional use, improper body mechanics, postural dysfunction, and pain.   ACTIVITY LIMITATIONS: lifting, standing, stairs, transfers, and locomotion level  PARTICIPATION LIMITATIONS: community activity and occupation  PERSONAL FACTORS: Age, Time since onset of injury/illness/exacerbation, and 3+ comorbidities: AS, A-fib, Neuropathy are also affecting patient's functional outcome.   REHAB POTENTIAL: Excellent  CLINICAL DECISION MAKING: Evolving/moderate complexity  EVALUATION COMPLEXITY: Moderate   GOALS: Goals reviewed with patient? Yes  SHORT TERM GOALS: Target date: 04/07/2024   Patient will be able to show independence for initial HEP to include posture, core and hip strength and stability.   Baseline: Goal status:MET   2.  Patient will be complete functional mobility testing/balance and goal set  Baseline:  Goal status:MET   3.  Pt will modify workout schedule to a strength focus Baseline:  Goal status:MET    LONG TERM GOALS: Target date: 1/15/ 2026    Patient will be independent  with final HEP upon discharge from PT and report consistent benefit following exercise completion.    Baseline:  Goal status: MET  2.  Pt will be able to demonstrate good standing balance as evidenced by balance recovery from min to mod dynamic balance challenges.   Baseline:  Goal status: MET  3.  Patient will be able to demonstrate hip/knee/ankle strength to 5/5 in order to maximize functional mobility,  ambulation and lifting.   Baseline:  Goal status:partially met- will continue to work on this with HEP  4.  Patient will be able to perform sit to stand x 10 in 30 sec Baseline: 18 sec x 5 , 16 reps in 30 sec  Goal status: MET   5.  Patient will feel more confident and stable in his balance when at church.   Baseline: reports was bad in 2020 but since he is working on it, this has been maintained currently.  Less balance confidence when dynamic.  Goal status: MET  6.  Pt will be able to stand for 30 min without increased LLE pain.  Baseline: does standing , inc LE pain  Goal status: partially met. - legs feel weak    PLAN:  PT FREQUENCY: 1-2x/week  PT DURATION: 8 weeks   PLANNED INTERVENTIONS: 97164- PT Re-evaluation, 97750- Physical Performance Testing, 97110-Therapeutic exercises, 97530- Therapeutic activity, 97112- Neuromuscular re-education, 97535- Self Care, 02859- Manual therapy, [phone]- Gait training, [phone]- Aquatic Therapy, [phone] (1-2 muscles), 20561 (3+ muscles)- Dry Needling, Patient/Family education, Balance training, Taping, Spinal mobilization, Cryotherapy, and Moist heat  PLAN FOR NEXT SESSION:      Tinnie Don, PT, DPT 12:04 PM  07/18/2024  PHYSICAL THERAPY DISCHARGE SUMMARY  Visits from Start of Care: 19   Plan: Patient agrees to discharge.  Patient goals were mostly met. Patient is being discharged due to meeting the stated rehab goals.  Pt will continue to work on leg strength with HEP.    Tinnie Don, PT, DPT 12:09 PM  07/18/2024      "

## 2024-07-25 ENCOUNTER — Encounter: Admitting: Physical Therapy

## 2024-07-25 ENCOUNTER — Other Ambulatory Visit: Payer: Self-pay

## 2024-07-25 DIAGNOSIS — I872 Venous insufficiency (chronic) (peripheral): Secondary | ICD-10-CM

## 2024-08-15 ENCOUNTER — Encounter: Payer: Self-pay | Admitting: Family Medicine

## 2024-08-15 ENCOUNTER — Ambulatory Visit: Admitting: Sports Medicine

## 2024-08-15 ENCOUNTER — Ambulatory Visit: Admitting: Family Medicine

## 2024-08-15 VITALS — BP 109/70 | Ht 75.0 in | Wt 212.0 lb

## 2024-08-15 DIAGNOSIS — G8929 Other chronic pain: Secondary | ICD-10-CM | POA: Diagnosis not present

## 2024-08-15 DIAGNOSIS — M25512 Pain in left shoulder: Secondary | ICD-10-CM | POA: Diagnosis not present

## 2024-08-15 DIAGNOSIS — Z7901 Long term (current) use of anticoagulants: Secondary | ICD-10-CM

## 2024-08-15 MED ORDER — PREDNISONE 10 MG PO TABS: ORAL_TABLET | ORAL | 0 refills | Status: AC

## 2024-08-15 NOTE — Progress Notes (Signed)
 DATE OF VISIT: 08/15/2024        Wesley [name]. DOB: 1941/04/29 MRN: [id number]  Discussed the use of AI scribe software for clinical note transcription with the patient, who gave verbal consent to proceed.  History of Present Illness Wesley [name]. Wesley [name] is an 84 year old male with left shoulder biceps tendinitis and bursitis who presents for follow-up of persistent left shoulder pain.  Left Shoulder Pain (Biceps Tendinitis and Bursitis): - Persistent pain in the left shoulder.  Last seen by Dr Harvey on July 04, 2024 and u/s showed biceps tendonitis and subacromial bursitis - Received corticosteroid injection on July 04, 2024, with symptomatic relief for a few weeks before recurrence of pain - Pain has intensified over the past one to two weeks, most pronounced at night, especially when lying down - Unable to sleep on left side and frequently awakens around 2 AM due to pain - Difficulty finding a comfortable position at night - Intermittent use of ibuprofen at night for relief, despite cardiac history and chronic anticoagulation therapy - Applies topical Voltaren before bed with some benefit - Pain radiates from the posterior aspect of the left shoulder down the arm - Exacerbated by activities such as carrying groceries or firewood - Reduced upper body exercise, avoiding weights, stretching, and internal/external rotation exercises - Experiences pain with certain arm movements, including driving  Right Shoulder Osteoarthritis: - Longstanding right shoulder osteoarthritis with less severe symptoms - Managed conservatively - Remains able to perform daily activities but has modified exercise routine to avoid exacerbating left shoulder pain    Medications:  Outpatient Encounter Medications as of 08/15/2024  Medication Sig   acidophilus (RISAQUAD) CAPS capsule Take 1 capsule by mouth daily.   Alpha-Lipoic Acid 600 MG CAPS Take 600 mg by mouth daily.   apixaban  (ELIQUIS ) 5 MG  TABS tablet Take 1 tablet (5 mg total) by mouth 2 (two) times daily.   Ascorbic Acid (VITAMIN C) 1000 MG tablet Take 1,000 mg by mouth daily.   b complex vitamins capsule Take 1 capsule by mouth daily.   Cyanocobalamin 5000 MCG SUBL Place 1 tablet under the tongue daily.    dronedarone (MULTAQ) 400 MG tablet Take 400 mg by mouth 2 (two) times daily with a meal.   gabapentin  (NEURONTIN ) 300 MG capsule Take 1 capsule (300 mg total) by mouth 4 (four) times daily.   metoprolol  succinate (TOPROL -XL) 25 MG 24 hr tablet TAKE 1 TABLET DAILY   Multiple Vitamin (MULTIVITAMIN) tablet Take 1 tablet by mouth daily.   Multiple Vitamins-Minerals (PRESERVISION AREDS 2+MULTI VIT) CAPS Take 1 tablet by mouth daily.    NEXIUM  40 MG capsule TAKE 1 CAPSULE DAILY   predniSONE  (DELTASONE ) 10 MG tablet Use as directed per doctors orders for the next 6 days.   rosuvastatin (CRESTOR) 5 MG tablet Take 5 mg by mouth daily. Taking 1/2 tab every other day   Turmeric (QC TUMERIC COMPLEX) 500 MG CAPS Take 500 mg by mouth daily. 2 Capsules Daily   [DISCONTINUED] predniSONE  (DELTASONE ) 10 MG tablet Use as directed per doctors orders for the next 6 days. (Patient not taking: Reported on 07/13/2024)   No facility-administered encounter medications on file as of 08/15/2024.    Allergies: has no known allergies.  Physical Examination: Vitals: BP 109/70   Ht 6' 3 (1.905 m)   Wt 212 lb (96.2 kg)   BMI 26.50 kg/m  GENERAL:  Wesley [name]. is a 84 y.o. male appearing their  stated age, alert and oriented x 3, in no apparent distress.  SKIN: no rashes or lesions, skin clean, dry, intact MSK: SHOULDER: Left shoulder without any gross deformity.  Full range of motion with positive painful arc.  Mild tenderness to palpation over the bicipital groove and greater tuberosity.  Positive empty can, positive Hawkins, negative Neer, negative drop arm.  Rotator cuff strength 5-/5. Right shoulder with slightly decreased range of motion  without pain.  Rotator cuff strength 5/5 throughout.  Negative impingement testing. NEURO: sensation intact to light touch VASC: pulses 2+ and symmetric artery bilaterally, no edema  Radiology: US  of left shoulder DATE: 07/04/24 FINDINGS:  Mild irregularity of humeral head Rotator cuff tendons: all are intact Minimal AC narrowing (Significant on RT) Bulls eye sign around biceps tendon On LAX there is a pseudo bursal swelling at Biceps   Impression:  Biceps tendonitis and bursitis  Ultrasound and interpretation by Wesley [name]. 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 Recurrence post-corticosteroid injection. NSAIDs contraindicated due to chronic anticoagulation. Partial relief with topical Voltaren. Activity modification essential. - Referred to physical therapy at [facility] for shoulder rehabilitation, including E-stim and manual therapy and possible dry needling. - Prescribed 6-day oral prednisone  taper to take as directed, sent to [facility]. - Instructed to continue activity modification, avoiding weights and provocative exercises. - Advised to continue topical Voltaren as needed. - Keep follow-up with Dr. Harvey as scheduled in February in approximately 1 month for reassessment.   Chronic anti-coagulation On Eliquis  for history of atrial fibrillation, therefore cannot take oral NSAIDs - Prescribed 6-day oral prednisone  taper to take as directed, sent to [facility] pharmacy.  Patient expressed understanding & agreement with above.  Encounter Diagnoses  Name Primary?   Chronic left shoulder pain Yes   Chronic anticoagulation     Orders Placed This Encounter  Procedures   Ambulatory referral to Physical Therapy     Contains text generated by [employer].

## 2024-08-16 ENCOUNTER — Encounter (HOSPITAL_COMMUNITY): Payer: Self-pay

## 2024-08-16 ENCOUNTER — Ambulatory Visit (HOSPITAL_COMMUNITY)
Admission: RE | Admit: 2024-08-16 | Discharge: 2024-08-16 | Disposition: A | Discharge status: Home / Self Care | Source: Ambulatory Visit | Attending: Cardiology | Admitting: Cardiology

## 2024-08-16 ENCOUNTER — Ambulatory Visit (HOSPITAL_COMMUNITY)

## 2024-08-16 DIAGNOSIS — I4811 Longstanding persistent atrial fibrillation: Secondary | ICD-10-CM | POA: Diagnosis present

## 2024-08-16 DIAGNOSIS — I341 Nonrheumatic mitral (valve) prolapse: Secondary | ICD-10-CM | POA: Insufficient documentation

## 2024-08-16 DIAGNOSIS — I471 Supraventricular tachycardia, unspecified: Secondary | ICD-10-CM | POA: Diagnosis present

## 2024-08-16 LAB — ECHOCARDIOGRAM COMPLETE
Area-P 1/2: 4.8 cm2
S' Lateral: 2.5 cm

## 2024-08-17 ENCOUNTER — Telehealth: Payer: Self-pay

## 2024-08-17 DIAGNOSIS — G8929 Other chronic pain: Secondary | ICD-10-CM

## 2024-08-17 NOTE — Telephone Encounter (Signed)
-----   Message from [place] C sent at 08/17/2024 11:06 AM EST ----- Regarding: PT order Pt is asking for PT order be placed to [facility]. Phone is [phone] Bosie was booked out until late feb

## 2024-08-18 ENCOUNTER — Ambulatory Visit: Payer: Self-pay | Admitting: Emergency Medicine

## 2024-08-23 ENCOUNTER — Other Ambulatory Visit (HOSPITAL_COMMUNITY)

## 2024-08-25 ENCOUNTER — Ambulatory Visit (HOSPITAL_COMMUNITY)
Admission: RE | Admit: 2024-08-25 | Discharge: 2024-08-25 | Disposition: A | Source: Ambulatory Visit | Attending: Vascular Surgery

## 2024-08-25 ENCOUNTER — Ambulatory Visit: Admitting: Physician Assistant

## 2024-08-25 ENCOUNTER — Encounter: Payer: Self-pay | Admitting: Physician Assistant

## 2024-08-25 VITALS — BP 134/77 | HR 76 | Ht 75.0 in | Wt 215.0 lb

## 2024-08-25 DIAGNOSIS — I83899 Varicose veins of unspecified lower extremities with other complications: Secondary | ICD-10-CM | POA: Insufficient documentation

## 2024-08-25 DIAGNOSIS — I872 Venous insufficiency (chronic) (peripheral): Secondary | ICD-10-CM | POA: Diagnosis present

## 2024-08-25 NOTE — Progress Notes (Signed)
 "   Requested by:  [name], MD [address] [place],  [place] [address]  Reason for consultation: varicose veins and swelling   History of Present Illness   Wesley R Lukus Binion. is a 84 y.o. 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 He says this has been going on for many years. He has a history of laser ablation of the right great saphenous vein in October of 2017 by Dr. Gerlean and he also had multiple stab phlebectomies (greater than 30) of painful varicosities in the right leg. Also has had veins stripped on the LLE by a General surgeon prior to the treatment of the RLE. He says the outcome from the right has been different than the left. He feels that he had much better results on the left. He has no issues with the left leg as far as swelling. He does have a couple varicosities but these don't bother him. On the right leg he has a lot of swelling and varicosities in the calf and behind right knee. He does wear medical grade knee high compression stockings most days and has for many years. He does elevate occasionally. He remains very active.   Venous symptoms include: swelling, varicose veins Onset/duration:  > 10 years  Occupation:  retired Aggravating factors: sitting, standing Alleviating factors: compression Compression:  yes Helps:  yes Pain medications:  no Previous vein procedures:  yes RLE venous ablation and stab phlebectomies, LLE stripping History of DVT:  no  Past Medical History:  Diagnosis Date   A-fib (HCC)    cardioversion x2   Ankylosing spondylitis (HCC)    Atrial flutter (HCC)    GERD (gastroesophageal reflux disease)    Hemorrhoids    Irregular heart beat    MVP (mitral valve prolapse)    WITH A MIDSYSTOLIC CLICK   OSA (obstructive sleep apnea)    uses CPAP nightly   Peripheral neuropathy    Vitamin B12 deficiency     Past Surgical History:  Procedure Laterality Date   ABLATION      CARDIOVERSION N/A 12/20/2019   Procedure: CARDIOVERSION;  Surgeon: [name], MD;  Location: [facility];  Service: Cardiovascular;  Laterality: N/A;   COLONOSCOPY     INGUINAL HERNIA REPAIR     LESION REMOVAL N/A 05/22/2022   Procedure: EXCISION UPPER BACK SEBACEOUS CYST;  Surgeon: [name], MD;  Location: [facility];  Service: General;  Laterality: N/A;  LOCAL ANESTHESIA   venous ligation     for varices left lower leg    Social History   Socioeconomic History   Marital status: Married    Spouse name: Not on file   Number of children: 2   Years of education: 18   Highest education level: Not on file  Occupational History   Occupation: Investment Banker, [employer]: [employer]  Tobacco Use   Smoking status: Former    Current packs/day: 0.00    Types: Cigarettes    Quit date: 07/27/1969    Years since quitting: 55.1   Smokeless tobacco: Never   Tobacco comments:    less than 1 pack/day  Vaping Use   Vaping status: Never Used  Substance and Sexual Activity   Alcohol  use: Yes    Comment: rare   Drug use: No   Sexual activity: Not on file  Other Topics Concern   Not on file  Social History Narrative   Not on  file   Social Drivers of Health   Tobacco Use: Medium Risk (08/25/2024)   Patient History    Smoking Tobacco Use: Former    Smokeless Tobacco Use: Never    Passive Exposure: Not on Actuary Strain: Not on file  Food Insecurity: Not on file  Transportation Needs: Not on file  Physical Activity: Not on file  Stress: Not on file  Social Connections: Unknown (12/07/2021)   [facility]   Social Network    Social Network: Not on file  Intimate Partner Violence: Unknown (10/30/2021)   Received from [facility]   HITS    Physically Hurt: Not on file    Insult or Talk Down To: Not on file    Threaten Physical Harm: Not on file    Scream or Curse: Not on file  Depression (PHQ2-9): Not on file  Alcohol  Screen: Not  on file  Housing: Not on file  Utilities: Not on file  Health Literacy: Not on file    Family History  Problem Relation Age of Onset   Prostate cancer Father    Arthritis Father    Cancer Father    Heart failure Mother    Hypertension Mother     Current Outpatient Medications  Medication Sig Dispense Refill   acidophilus (RISAQUAD) CAPS capsule Take 1 capsule by mouth daily.     Alpha-Lipoic Acid 600 MG CAPS Take 600 mg by mouth daily.     apixaban  (ELIQUIS ) 5 MG TABS tablet Take 1 tablet (5 mg total) by mouth 2 (two) times daily. 180 tablet 1   Ascorbic Acid (VITAMIN C) 1000 MG tablet Take 1,000 mg by mouth daily.     b complex vitamins capsule Take 1 capsule by mouth daily.     Cyanocobalamin 5000 MCG SUBL Place 1 tablet under the tongue daily.      dronedarone (MULTAQ) 400 MG tablet Take 400 mg by mouth 2 (two) times daily with a meal.     gabapentin  (NEURONTIN ) 300 MG capsule Take 1 capsule (300 mg total) by mouth 4 (four) times daily. 360 capsule 3   metoprolol  succinate (TOPROL -XL) 25 MG 24 hr tablet TAKE 1 TABLET DAILY 90 tablet 3   Multiple Vitamin (MULTIVITAMIN) tablet Take 1 tablet by mouth daily.     Multiple Vitamins-Minerals (PRESERVISION AREDS 2+MULTI VIT) CAPS Take 1 tablet by mouth daily.      NEXIUM  40 MG capsule TAKE 1 CAPSULE DAILY 90 capsule 3   predniSONE  (DELTASONE ) 10 MG tablet Use as directed per doctors orders for the next 6 days. 21 tablet 0   rosuvastatin (CRESTOR) 5 MG tablet Take 5 mg by mouth daily. Taking 1/2 tab every other day     Turmeric (QC TUMERIC COMPLEX) 500 MG CAPS Take 500 mg by mouth daily. 2 Capsules Daily     No current facility-administered medications for this visit.    Allergies[1]  REVIEW OF SYSTEMS (negative unless checked):   Cardiac:  []  Chest pain or chest pressure? []  Shortness of breath upon activity? []  Shortness of breath when lying flat? []  Irregular heart rhythm?  Vascular:  []  Pain in calf, thigh, or hip brought  on by walking? []  Pain in feet at night that wakes you up from your sleep? []  Blood clot in your veins? [x]  Leg swelling?  Pulmonary:  []  Oxygen at home? []  Productive cough? []  Wheezing?  Neurologic:  []  Sudden weakness in arms or legs? []  Sudden numbness in arms  or legs? []  Sudden onset of difficult speaking or slurred speech? []  Temporary loss of vision in one eye? []  Problems with dizziness?  Gastrointestinal:  []  Blood in stool? []  Vomited blood?  Genitourinary:  []  Burning when urinating? []  Blood in urine?  Psychiatric:  []  Major depression  Hematologic:  []  Bleeding problems? []  Problems with blood clotting?  Dermatologic:  []  Rashes or ulcers?  Constitutional:  []  Fever or chills?  Ear/Nose/Throat:  []  Change in hearing? []  Nose bleeds? []  Sore throat?  Musculoskeletal:  []  Back pain? []  Joint pain? []  Muscle pain?   Physical Examination     Vitals:   08/25/24 1228  BP: 134/77  Pulse: 76  Weight: 215 lb (97.5 kg)  Height: 6' 3 (1.905 m)   Body mass index is 26.87 kg/m.  General:  WDWN in NAD; vital signs documented above Gait: Normal HENT: WNL, normocephalic Pulmonary: normal non-labored breathing Cardiac: regular HR Abdomen: soft Vascular Exam/Pulses:2+ DP pulses bilaterally, feet warm and well perfused Extremities: with large cluster of varicose veins on medial proximal right calf, posterior calf and popliteal fossa, small amount on anterior left lower leg, with reticular veins, with very mild edema, with mild stasis pigmentation, without lipodermatosclerosis, without ulcers Musculoskeletal: no muscle wasting or atrophy  Neurologic: A&O X 3;  No focal weakness or paresthesias are detected Psychiatric:  The pt has Normal affect.  Non-invasive Vascular Imaging   BLE Venous Insufficiency Duplex (08/25/24):  RLE:  No DVT and SVT GSV reflux SFJ to distal calf GSV diameter 0.4-0.63 cm SSV reflux in calf CFV, Popliteal deep venous  reflux  Medical Decision Making   Wesley R Shedrick Sarli. is a 84 y.o. male who presents with: RLE chronic venous insufficiency with varicose veins with swelling. Duplex shows no DVT or SVT. He does have significant deep and superficial reflux in the GSV and SSV. His GSV is large enough to be considered for ablation. He does have history of a right GSV ablation with stabs of > 30 varicosities. I discussed with him that either the GSV recanalized or possible the anterior accessory vein was actually treated. It is hard to know but on today's evaluation his GSV is incompetent. He is interested in pursuing treatment but wants to ensure that the ablation will be effective this time. I have recommended he return to discuss ablation vs stripping with Dr. Serene.  Based on the patient's history and examination, I recommend: daily elevation of 20-30 minutes above level of heart, daily compression stocking use, exercise, weight reduction, refraining from prolonged sitting or standing. I discussed with the patient the use of his 20-30 mm thigh high compression stockings The patient will follow up with Dr. Serene in the next couple weeks to discuss treatment options moving forward   Wesley Damme, PA-C [facility] Office: [phone]  08/25/2024, 12:35 PM  Clinic MD: [place]     [1] Not on File  "

## 2024-08-28 ENCOUNTER — Other Ambulatory Visit: Payer: Self-pay | Admitting: Cardiology

## 2024-08-28 DIAGNOSIS — I480 Paroxysmal atrial fibrillation: Secondary | ICD-10-CM

## 2024-09-04 ENCOUNTER — Ambulatory Visit: Admitting: Surgery

## 2024-09-05 ENCOUNTER — Ambulatory Visit: Admitting: Family Medicine

## 2024-09-07 ENCOUNTER — Ambulatory Visit: Admitting: Sports Medicine

## 2024-09-15 ENCOUNTER — Ambulatory Visit: Admitting: Student in an Organized Health Care Education/Training Program

## 2024-11-16 ENCOUNTER — Ambulatory Visit: Admitting: Internal Medicine
